# Patient Record
Sex: Male | Born: 1945
Health system: Southern US, Community
[De-identification: ages and names within clinical notes are randomized; demographics above are authoritative.]

## PROBLEM LIST (undated history)

## (undated) DIAGNOSIS — E7800 Pure hypercholesterolemia, unspecified: Secondary | ICD-10-CM

## (undated) DIAGNOSIS — F419 Anxiety disorder, unspecified: Secondary | ICD-10-CM

## (undated) DIAGNOSIS — I6521 Occlusion and stenosis of right carotid artery: Secondary | ICD-10-CM

## (undated) DIAGNOSIS — G5600 Carpal tunnel syndrome, unspecified upper limb: Secondary | ICD-10-CM

## (undated) DIAGNOSIS — I2510 Atherosclerotic heart disease of native coronary artery without angina pectoris: Secondary | ICD-10-CM

## (undated) DIAGNOSIS — K219 Gastro-esophageal reflux disease without esophagitis: Secondary | ICD-10-CM

## (undated) DIAGNOSIS — E669 Obesity, unspecified: Secondary | ICD-10-CM

## (undated) DIAGNOSIS — Z87442 Personal history of urinary calculi: Secondary | ICD-10-CM

## (undated) DIAGNOSIS — I639 Cerebral infarction, unspecified: Secondary | ICD-10-CM

## (undated) DIAGNOSIS — Z860100 Personal history of colon polyps, unspecified: Secondary | ICD-10-CM

## (undated) DIAGNOSIS — I10 Essential (primary) hypertension: Secondary | ICD-10-CM

## (undated) DIAGNOSIS — M1990 Unspecified osteoarthritis, unspecified site: Secondary | ICD-10-CM

## (undated) DIAGNOSIS — I219 Acute myocardial infarction, unspecified: Secondary | ICD-10-CM

## (undated) HISTORY — DX: Acute myocardial infarction, unspecified: I21.9

## (undated) HISTORY — DX: Cerebral infarction, unspecified: I63.9

## (undated) HISTORY — DX: Atherosclerotic heart disease of native coronary artery without angina pectoris: I25.10

## (undated) HISTORY — PX: CYST EXCISION: SHX5701

## (undated) HISTORY — DX: Anxiety disorder, unspecified: F41.9

## (undated) HISTORY — DX: Essential (primary) hypertension: I10

## (undated) HISTORY — DX: Personal history of colonic polyps: Z86.010

## (undated) HISTORY — DX: Obesity, unspecified: E66.9

## (undated) HISTORY — PX: CARDIAC CATHETERIZATION: SHX172

## (undated) HISTORY — DX: Pure hypercholesterolemia, unspecified: E78.00

## (undated) HISTORY — DX: Carpal tunnel syndrome, unspecified upper limb: G56.00

## (undated) HISTORY — DX: Personal history of colon polyps, unspecified: Z86.0100

---

## 1998-05-12 ENCOUNTER — Inpatient Hospital Stay (HOSPITAL_COMMUNITY): Admission: EM | Admit: 1998-05-12 | Discharge: 1998-05-14 | Payer: Self-pay | Admitting: Emergency Medicine

## 1998-05-12 ENCOUNTER — Encounter: Payer: Self-pay | Admitting: Emergency Medicine

## 1998-05-23 HISTORY — PX: CORONARY STENT PLACEMENT: SHX1402

## 1998-10-01 ENCOUNTER — Encounter: Admission: RE | Admit: 1998-10-01 | Discharge: 1998-10-28 | Payer: Self-pay

## 1998-11-30 ENCOUNTER — Encounter: Admission: RE | Admit: 1998-11-30 | Discharge: 1999-02-28 | Payer: Self-pay

## 1999-04-05 ENCOUNTER — Encounter: Admission: RE | Admit: 1999-04-05 | Discharge: 1999-04-05 | Payer: Self-pay

## 2000-02-15 ENCOUNTER — Encounter (INDEPENDENT_AMBULATORY_CARE_PROVIDER_SITE_OTHER): Payer: Self-pay | Admitting: Specialist

## 2000-02-15 ENCOUNTER — Other Ambulatory Visit: Admission: RE | Admit: 2000-02-15 | Discharge: 2000-02-15 | Payer: Self-pay | Admitting: Internal Medicine

## 2001-02-22 ENCOUNTER — Other Ambulatory Visit: Admission: RE | Admit: 2001-02-22 | Discharge: 2001-02-22 | Payer: Self-pay | Admitting: Internal Medicine

## 2003-05-30 ENCOUNTER — Emergency Department (HOSPITAL_COMMUNITY): Admission: EM | Admit: 2003-05-30 | Discharge: 2003-05-30 | Payer: Self-pay | Admitting: Emergency Medicine

## 2004-07-16 ENCOUNTER — Ambulatory Visit (HOSPITAL_COMMUNITY): Admission: RE | Admit: 2004-07-16 | Discharge: 2004-07-16 | Payer: Self-pay | Admitting: Internal Medicine

## 2004-12-17 ENCOUNTER — Ambulatory Visit: Payer: Self-pay | Admitting: Internal Medicine

## 2004-12-24 ENCOUNTER — Ambulatory Visit: Payer: Self-pay | Admitting: Cardiology

## 2005-01-11 ENCOUNTER — Ambulatory Visit: Payer: Self-pay | Admitting: Cardiology

## 2005-02-02 ENCOUNTER — Ambulatory Visit: Payer: Self-pay | Admitting: Cardiology

## 2005-04-05 ENCOUNTER — Encounter (INDEPENDENT_AMBULATORY_CARE_PROVIDER_SITE_OTHER): Payer: Self-pay | Admitting: Family Medicine

## 2005-04-06 ENCOUNTER — Encounter: Payer: Self-pay | Admitting: Orthopedic Surgery

## 2005-04-11 ENCOUNTER — Ambulatory Visit: Payer: Self-pay | Admitting: Orthopedic Surgery

## 2005-08-01 ENCOUNTER — Ambulatory Visit: Payer: Self-pay | Admitting: Family Medicine

## 2005-09-22 ENCOUNTER — Ambulatory Visit: Payer: Self-pay | Admitting: Orthopedic Surgery

## 2005-10-31 ENCOUNTER — Ambulatory Visit: Payer: Self-pay | Admitting: Family Medicine

## 2006-01-19 ENCOUNTER — Ambulatory Visit: Payer: Self-pay | Admitting: Cardiology

## 2006-01-26 ENCOUNTER — Ambulatory Visit: Payer: Self-pay | Admitting: Cardiology

## 2006-02-14 ENCOUNTER — Ambulatory Visit: Payer: Self-pay | Admitting: Family Medicine

## 2006-03-23 ENCOUNTER — Encounter (INDEPENDENT_AMBULATORY_CARE_PROVIDER_SITE_OTHER): Payer: Self-pay | Admitting: Family Medicine

## 2006-04-03 ENCOUNTER — Ambulatory Visit: Payer: Self-pay | Admitting: Family Medicine

## 2006-04-10 ENCOUNTER — Ambulatory Visit: Payer: Self-pay | Admitting: Family Medicine

## 2006-05-03 ENCOUNTER — Ambulatory Visit: Payer: Self-pay

## 2006-06-05 ENCOUNTER — Ambulatory Visit: Payer: Self-pay

## 2006-06-19 ENCOUNTER — Encounter: Payer: Self-pay | Admitting: Family Medicine

## 2006-06-19 DIAGNOSIS — E669 Obesity, unspecified: Secondary | ICD-10-CM | POA: Insufficient documentation

## 2006-06-19 DIAGNOSIS — Z860100 Personal history of colon polyps, unspecified: Secondary | ICD-10-CM | POA: Insufficient documentation

## 2006-06-19 DIAGNOSIS — E7800 Pure hypercholesterolemia, unspecified: Secondary | ICD-10-CM

## 2006-06-19 DIAGNOSIS — I252 Old myocardial infarction: Secondary | ICD-10-CM | POA: Insufficient documentation

## 2006-06-19 DIAGNOSIS — I10 Essential (primary) hypertension: Secondary | ICD-10-CM | POA: Insufficient documentation

## 2006-07-06 ENCOUNTER — Ambulatory Visit: Payer: Self-pay | Admitting: Family Medicine

## 2006-07-07 ENCOUNTER — Telehealth (INDEPENDENT_AMBULATORY_CARE_PROVIDER_SITE_OTHER): Payer: Self-pay | Admitting: Family Medicine

## 2006-07-10 ENCOUNTER — Ambulatory Visit (HOSPITAL_COMMUNITY): Admission: RE | Admit: 2006-07-10 | Discharge: 2006-07-10 | Payer: Self-pay | Admitting: Family Medicine

## 2006-07-10 ENCOUNTER — Encounter (INDEPENDENT_AMBULATORY_CARE_PROVIDER_SITE_OTHER): Payer: Self-pay | Admitting: Family Medicine

## 2006-07-10 ENCOUNTER — Telehealth (INDEPENDENT_AMBULATORY_CARE_PROVIDER_SITE_OTHER): Payer: Self-pay | Admitting: Family Medicine

## 2006-07-28 ENCOUNTER — Encounter (INDEPENDENT_AMBULATORY_CARE_PROVIDER_SITE_OTHER): Payer: Self-pay | Admitting: Family Medicine

## 2006-12-18 ENCOUNTER — Telehealth (INDEPENDENT_AMBULATORY_CARE_PROVIDER_SITE_OTHER): Payer: Self-pay | Admitting: Family Medicine

## 2006-12-18 ENCOUNTER — Ambulatory Visit: Payer: Self-pay | Admitting: Family Medicine

## 2006-12-18 LAB — CONVERTED CEMR LAB
Cholesterol, target level: 200 mg/dL
LDL Goal: 100 mg/dL
Rapid Strep: NEGATIVE

## 2007-01-01 ENCOUNTER — Ambulatory Visit: Payer: Self-pay | Admitting: Family Medicine

## 2007-01-01 DIAGNOSIS — I2510 Atherosclerotic heart disease of native coronary artery without angina pectoris: Secondary | ICD-10-CM | POA: Insufficient documentation

## 2007-02-09 ENCOUNTER — Ambulatory Visit: Payer: Self-pay | Admitting: Cardiology

## 2007-03-08 ENCOUNTER — Encounter (INDEPENDENT_AMBULATORY_CARE_PROVIDER_SITE_OTHER): Payer: Self-pay | Admitting: Family Medicine

## 2007-03-11 ENCOUNTER — Encounter (INDEPENDENT_AMBULATORY_CARE_PROVIDER_SITE_OTHER): Payer: Self-pay | Admitting: Family Medicine

## 2007-03-12 ENCOUNTER — Ambulatory Visit: Payer: Self-pay | Admitting: Family Medicine

## 2007-03-12 DIAGNOSIS — B009 Herpesviral infection, unspecified: Secondary | ICD-10-CM | POA: Insufficient documentation

## 2007-12-25 ENCOUNTER — Ambulatory Visit: Payer: Self-pay | Admitting: Family Medicine

## 2007-12-25 DIAGNOSIS — G5603 Carpal tunnel syndrome, bilateral upper limbs: Secondary | ICD-10-CM | POA: Insufficient documentation

## 2007-12-25 DIAGNOSIS — G5600 Carpal tunnel syndrome, unspecified upper limb: Secondary | ICD-10-CM | POA: Insufficient documentation

## 2007-12-25 LAB — CONVERTED CEMR LAB
Blood in Urine, dipstick: NEGATIVE
Protein, U semiquant: NEGATIVE
Urobilinogen, UA: 0.2
WBC Urine, dipstick: NEGATIVE
pH: 5.5

## 2007-12-26 ENCOUNTER — Encounter (INDEPENDENT_AMBULATORY_CARE_PROVIDER_SITE_OTHER): Payer: Self-pay | Admitting: Family Medicine

## 2008-01-02 LAB — CONVERTED CEMR LAB
ALT: 27 units/L (ref 0–53)
Alkaline Phosphatase: 53 units/L (ref 39–117)
BUN: 17 mg/dL (ref 6–23)
Basophils Absolute: 0 10*3/uL (ref 0.0–0.1)
Basophils Relative: 1 % (ref 0–1)
CO2: 21 meq/L (ref 19–32)
Chloride: 107 meq/L (ref 96–112)
Cholesterol: 127 mg/dL (ref 0–200)
Creatinine, Ser: 1.17 mg/dL (ref 0.40–1.50)
Eosinophils Absolute: 0.2 10*3/uL (ref 0.0–0.7)
Glucose, Bld: 87 mg/dL (ref 70–99)
HCT: 46.8 % (ref 39.0–52.0)
Hemoglobin: 15.6 g/dL (ref 13.0–17.0)
Lymphocytes Relative: 23 % (ref 12–46)
Lymphs Abs: 1.3 10*3/uL (ref 0.7–4.0)
Monocytes Relative: 9 % (ref 3–12)
Neutro Abs: 3.7 10*3/uL (ref 1.7–7.7)
Platelets: 201 10*3/uL (ref 150–400)
RBC: 5.19 M/uL (ref 4.22–5.81)
Sodium: 139 meq/L (ref 135–145)
Total Bilirubin: 0.6 mg/dL (ref 0.3–1.2)
VLDL: 36 mg/dL (ref 0–40)

## 2008-01-15 ENCOUNTER — Ambulatory Visit: Payer: Self-pay | Admitting: Family Medicine

## 2008-04-09 ENCOUNTER — Ambulatory Visit: Payer: Self-pay | Admitting: Internal Medicine

## 2008-04-21 ENCOUNTER — Ambulatory Visit: Payer: Self-pay | Admitting: Family Medicine

## 2008-04-21 DIAGNOSIS — J209 Acute bronchitis, unspecified: Secondary | ICD-10-CM | POA: Insufficient documentation

## 2008-05-07 ENCOUNTER — Ambulatory Visit: Payer: Self-pay | Admitting: Family Medicine

## 2008-05-07 DIAGNOSIS — H60399 Other infective otitis externa, unspecified ear: Secondary | ICD-10-CM | POA: Insufficient documentation

## 2008-05-13 ENCOUNTER — Ambulatory Visit: Payer: Self-pay | Admitting: Family Medicine

## 2009-01-06 ENCOUNTER — Encounter (INDEPENDENT_AMBULATORY_CARE_PROVIDER_SITE_OTHER): Payer: Self-pay | Admitting: Family Medicine

## 2009-01-12 ENCOUNTER — Encounter (INDEPENDENT_AMBULATORY_CARE_PROVIDER_SITE_OTHER): Payer: Self-pay | Admitting: Family Medicine

## 2009-01-28 ENCOUNTER — Encounter (INDEPENDENT_AMBULATORY_CARE_PROVIDER_SITE_OTHER): Payer: Self-pay | Admitting: *Deleted

## 2009-03-16 ENCOUNTER — Ambulatory Visit: Payer: Self-pay | Admitting: Internal Medicine

## 2009-03-30 ENCOUNTER — Ambulatory Visit: Payer: Self-pay | Admitting: Internal Medicine

## 2009-03-30 ENCOUNTER — Encounter: Payer: Self-pay | Admitting: Internal Medicine

## 2009-03-30 DIAGNOSIS — R1084 Generalized abdominal pain: Secondary | ICD-10-CM | POA: Insufficient documentation

## 2009-04-01 ENCOUNTER — Ambulatory Visit: Payer: Self-pay | Admitting: Gastroenterology

## 2009-04-01 ENCOUNTER — Telehealth (INDEPENDENT_AMBULATORY_CARE_PROVIDER_SITE_OTHER): Payer: Self-pay | Admitting: *Deleted

## 2009-04-01 ENCOUNTER — Ambulatory Visit (HOSPITAL_COMMUNITY): Admission: RE | Admit: 2009-04-01 | Discharge: 2009-04-01 | Payer: Self-pay | Admitting: Internal Medicine

## 2009-04-01 DIAGNOSIS — E782 Mixed hyperlipidemia: Secondary | ICD-10-CM | POA: Insufficient documentation

## 2009-04-01 DIAGNOSIS — R1032 Left lower quadrant pain: Secondary | ICD-10-CM | POA: Insufficient documentation

## 2009-04-01 DIAGNOSIS — K625 Hemorrhage of anus and rectum: Secondary | ICD-10-CM | POA: Insufficient documentation

## 2009-04-01 LAB — CONVERTED CEMR LAB
Eosinophils Relative: 5.3 % — ABNORMAL HIGH (ref 0.0–5.0)
HCT: 43 % (ref 39.0–52.0)
Lymphocytes Relative: 26.2 % (ref 12.0–46.0)
Monocytes Absolute: 0.5 10*3/uL (ref 0.1–1.0)
Monocytes Relative: 8.3 % (ref 3.0–12.0)
Neutro Abs: 3.2 10*3/uL (ref 1.4–7.7)
Platelets: 171 10*3/uL (ref 150.0–400.0)
WBC: 5.6 10*3/uL (ref 4.5–10.5)

## 2009-04-02 ENCOUNTER — Encounter: Payer: Self-pay | Admitting: Internal Medicine

## 2009-04-03 ENCOUNTER — Ambulatory Visit: Payer: Self-pay | Admitting: Gastroenterology

## 2009-04-03 DIAGNOSIS — K602 Anal fissure, unspecified: Secondary | ICD-10-CM | POA: Insufficient documentation

## 2010-04-20 ENCOUNTER — Ambulatory Visit: Payer: Self-pay | Admitting: Orthopedic Surgery

## 2010-04-20 DIAGNOSIS — M6530 Trigger finger, unspecified finger: Secondary | ICD-10-CM | POA: Insufficient documentation

## 2010-06-22 NOTE — Progress Notes (Signed)
Summary: Progress note  Progress note   Imported By: Jacklynn Ganong 04/19/2010 10:14:44  _____________________________________________________________________  External Attachment:    Type:   Image     Comment:   External Document

## 2010-06-22 NOTE — Letter (Signed)
Summary: History form  History form   Imported By: Jacklynn Ganong 04/20/2010 15:55:38  _____________________________________________________________________  External Attachment:    Type:   Image     Comment:   External Document

## 2010-06-22 NOTE — Progress Notes (Signed)
Summary: Initial evaluation  Initial evaluation   Imported By: Jacklynn Ganong 04/19/2010 10:13:52  _____________________________________________________________________  External Attachment:    Type:   Image     Comment:   External Document

## 2010-06-22 NOTE — Assessment & Plan Note (Signed)
Summary: TRIGGER FINGER LT MIDDLE FINGER/MEDICARE/BCBS   Vital Signs:  Patient profile:   65 year old male Height:      66 inches Weight:      218 pounds Pulse rate:   68 / minute Resp:     16 per minute  Vitals Entered By: [name] (April 20, 2010 2:25 PM)  Visit Type:  new patient Referring Provider:  self Primary Provider:  [name], MD  CC:  trigger finger.  History of Present Illness: I saw Jeremy [name] in the office today for an initial visit.  He is a 65 years old man with the complaint of:  trigger finger, left middle.  No injury.  No xrays.  Meds: Lipitor, Metoprolol, Enalapril.  Patient has history of RIGHT and LEFT trigger thumbs, now presents with LEFT long finger triggering, locking, stabbing, burning pain. No history of trauma. No numbness present for 3 months    Allergies (verified): No Known Drug Allergies  Past History:  Past Surgical History: CAD,STENT X 3 2000' 2 cyst removed?  Family History: Father: Dead Old Age - 83s.  Mother: Dead 78s CAD and HTN Siblings: Sisters: 1 Living 81 - Healthy, 2 Dead Not sure about one and the other had epilepsy Brothers: 2 Living: 6 and 50 - CAD and Skin Cancers Not sure what primary was. FH of Cancer:  Family History of Diabetes Family History Coronary Heart Disease male < 32 Family History of Arthritis Hx, family, asthma  Social History: Divorced Hx of smoking: Quit 20 years ago - smoked 2 packs per day for 20 or so years. no alcohol 4 cups of coffee daily No ETOH Retired: Psychologist, occupational 10th grade  Review of Systems Respiratory:  Complains of couch and tightness; denies short of breath, wheezing, pain on inspiration, and snoring . Neurologic:  Complains of numbness, tingling, and tremors; denies unsteady gait, dizziness, and seizure. Musculoskeletal:  Complains of joint pain, swelling, stiffness, and muscle pain; denies instability, redness, and heat. HEENT:  Complains of eye pain and  watering; denies blurred or double vision and redness. Hemoatologic:  Complains of easy bleeding; denies brusing.  The review of systems is negative for Constitutional, Cardiovascular, Gastrointestinal, Genitourinary, Endocrine, Psychiatric, Skin, and Immunology.  Physical Exam  Additional Exam:  general appearance was normal.  Normal vascular function to the long finger and the hand.  The skin was intact.  The sensation was normal.  The patient was awake, alert, and oriented x3.  Range of motion was normal. However, there was tenderness over the A1 pulley. Alignment was normal. Strength in grip was normal.  Lymph nodes were not tested, and reflexes were not tested   Impression & Recommendations:  Problem # 1:  TRIGGER FINGER, LEFT MIDDLE (ICD-727.03) Assessment New  Verbal consent was obtained: The LLfinger was prepped with ethyl chloride and injected with 1:1 injection of .25% sensorcaine, 1cc  and 40 mg of depomedrol, 1cc. There were no complications.  Orders: New Patient Level II ([address]) Injection, Tendon / Ligament (60454) Depo- Medrol 40mg  (J1030)  Patient Instructions: 1)  You have received an injection of cortisone today. You may experience increased pain at the injection site. Apply ice pack to the area for 20 minutes every 2 hours and take 2 xtra strength tylenol every 8 hours. This increased pain will usually resolve in 24 hours. The injection will take effect in 3-10 days.  2)  Please schedule a follow-up appointment as needed.   Orders Added: 1)  New Patient  Level II [99202] 2)  Injection, Tendon / Ligament [20550] 3)  Depo- Medrol 40mg  [J1030]

## 2010-07-26 ENCOUNTER — Telehealth: Payer: Self-pay | Admitting: Cardiology

## 2010-08-03 NOTE — Progress Notes (Signed)
Summary: numbness in mouth  Phone Note Call from Patient   Caller: Patient [phone] or  [phone] Reason for Call: Talk to Nurse, Privacy/Consent Authorization Summary of Call: pt having numbness in tongue, lips and inside of cheeks x 6 weeks Initial call taken by: Glynda Jaeger,  July 26, 2010 9:32 AM  Follow-up for Phone Call        07/26/10--1330pm--pt calling c/o numbness in tongue,sides of mouth, and lips--pt has not seen [place] since 2008--pt states he is seeing PCP today, so i advised him to let pcp know of his symptoms--pt would also like to see dr Riley Kill again so pt was transferred to appoint. to make one Follow-up by: Ledon Snare, RN,  July 26, 2010 1:33 PM

## 2010-08-30 ENCOUNTER — Encounter: Payer: Self-pay | Admitting: Cardiology

## 2010-09-01 ENCOUNTER — Ambulatory Visit (INDEPENDENT_AMBULATORY_CARE_PROVIDER_SITE_OTHER): Payer: Medicare Other | Admitting: Cardiology

## 2010-09-01 ENCOUNTER — Encounter: Payer: Self-pay | Admitting: Cardiology

## 2010-09-01 DIAGNOSIS — I2510 Atherosclerotic heart disease of native coronary artery without angina pectoris: Secondary | ICD-10-CM

## 2010-09-01 DIAGNOSIS — N529 Male erectile dysfunction, unspecified: Secondary | ICD-10-CM

## 2010-09-01 DIAGNOSIS — I10 Essential (primary) hypertension: Secondary | ICD-10-CM

## 2010-09-01 DIAGNOSIS — E7800 Pure hypercholesterolemia, unspecified: Secondary | ICD-10-CM

## 2010-09-01 MED ORDER — ATORVASTATIN CALCIUM 40 MG PO TABS: 40.0000 mg | ORAL_TABLET | Freq: Every day | ORAL | Status: DC

## 2010-09-01 MED ORDER — NITROGLYCERIN 0.4 MG SL SUBL: 0.4000 mg | SUBLINGUAL_TABLET | SUBLINGUAL | Status: DC | PRN

## 2010-09-01 MED ORDER — ENALAPRIL MALEATE 2.5 MG PO TABS: 2.5000 mg | ORAL_TABLET | Freq: Every day | ORAL | Status: DC

## 2010-09-01 MED ORDER — METOPROLOL SUCCINATE ER 50 MG PO TB24: 50.0000 mg | ORAL_TABLET | Freq: Every day | ORAL | Status: DC

## 2010-09-01 NOTE — Patient Instructions (Signed)
Your physician recommends that you continue on your current medications as directed. Please refer to the Current Medication list given to you today.     

## 2010-09-05 DIAGNOSIS — N529 Male erectile dysfunction, unspecified: Secondary | ICD-10-CM | POA: Insufficient documentation

## 2010-09-05 NOTE — Assessment & Plan Note (Signed)
His BP are elevated, but he must check them at home, and provide follow up to Dr. Margo Aye with regard to reevaluating his medications.  Once this is recorded, appropriate changes can be made.  Question as to weather or not sleep apnea might be present.  Would defer to Dr. Margo Aye.

## 2010-09-05 NOTE — Assessment & Plan Note (Signed)
No current symptoms.  He is doing well. No further treatment at present based on guidelines.

## 2010-09-05 NOTE — Progress Notes (Signed)
HPI:  Jeremy [name] is getting along well.  He is not having chest pain or shortness of breath.  We also reviewed his medications, and he is tolerating these well.  He denies syncope, rash, fatigue.   Current Outpatient Prescriptions  Medication Sig Dispense Refill  . aspirin 325 MG tablet Take 325 mg by mouth daily.        Marland Kitchen atorvastatin (LIPITOR) 40 MG tablet Take 1 tablet (40 mg total) by mouth daily.  90 tablet  3  . enalapril (VASOTEC) 2.5 MG tablet Take 1 tablet (2.5 mg total) by mouth daily.  90 tablet  3  . metoprolol (TOPROL-XL) 50 MG 24 hr tablet Take 1 tablet (50 mg total) by mouth daily.  90 tablet  3  . nitroGLYCERIN (NITROSTAT) 0.4 MG SL tablet Place 1 tablet (0.4 mg total) under the tongue every 5 (five) minutes as needed for chest pain.  25 tablet  6    No Known Allergies  Past Medical History  Diagnosis Date  . Carpal tunnel syndrome     bilateral  . Coronary artery disease   . Obesity   . Hypercholesterolemia   . Myocardial infarction     hx of  . Hypertension   . Hx of colonic polyp     Past Surgical History  Procedure Date  . Coronary stent placement 2000    x3     Family History  Problem Relation Age of Onset  . Other Father 83    deceased old age  . Coronary artery disease Mother 70    deceased  . Hypertension Mother   . Other Sister 64    living and healthy  . Coronary artery disease Brother   . Skin cancer Brother   . Cancer      family hx of  . Diabetes      family hx of  . Coronary artery disease      family hx of male < 75  . Arthritis      family hx of  . Asthma      family hx of    History   Social History  . Marital Status: Divorced    Spouse Name: N/A    Number of Children: N/A  . Years of Education: N/A   Occupational History  . retired     Psychologist, occupational   Social History Main Topics  . Smoking status: Former Smoker    Quit date: 05/23/1990  . Smokeless tobacco: Not on file   Comment: smoked 2 packs per day for 20 years  .  Alcohol Use: No  . Drug Use: No  . Sexually Active: Not on file   Other Topics Concern  . Not on file   Social History Narrative  . No narrative on file    ROS: Please see the HPI.  All other systems reviewed and negative.  PHYSICAL EXAM:  BP 169/83  Pulse 65  Ht 5\' 8"  (1.727 m)  Wt 210 lb (95.255 kg)  BMI 31.93 kg/m2  General: Well developed, well nourished, in no acute distress. Head:  Normocephalic and atraumatic. Neck: no JVD Lungs: Clear to auscultation and percussion. Heart: Normal S1 and S2.  There is a prominent S4.   Abdomen:  Normal bowel sounds; soft; non tender; no organomegaly Pulses: Pulses normal in all 4 extremities. Extremities: No clubbing or cyanosis. No edema. Neurologic: Alert and oriented x 3.  EKG:  Normal sinus rhythm.  WNL.  ASSESSMENT AND PLAN:

## 2010-09-05 NOTE — Assessment & Plan Note (Signed)
Patient notes some difficulty maintaining an erection.  Role of CAD and medications discussed.  He would like to try Viagra, or something similar.  Interaction, and inability to use NTG discussed with patient.  I told him to bring it up with Dr. Margo Aye at his appointment.  It would be ok to try from a cardiac standpoint.

## 2010-09-05 NOTE — Assessment & Plan Note (Signed)
Last lipids are more than three years ago here, but getting labs done with Dr. Margo Aye in [place] who does close follow up.

## 2010-09-08 ENCOUNTER — Telehealth: Payer: Self-pay | Admitting: Cardiology

## 2010-09-08 NOTE — Telephone Encounter (Signed)
Left message for pt to call back.  Unsure of pt's question in regards to ASA.

## 2010-09-09 MED ORDER — ASPIRIN EC 81 MG PO TBEC: 81.0000 mg | DELAYED_RELEASE_TABLET | Freq: Every day | ORAL | Status: AC

## 2010-09-09 NOTE — Telephone Encounter (Signed)
Pt calling back re yesterday's message, pt on 81mg  of asa and was told by dr Riley Kill to stop and to start baby asa, but the baby is 81mg  too, pt confused

## 2010-09-09 NOTE — Telephone Encounter (Signed)
Called patient back and advised that baby Jeremy [name] is the same as Asa 81 mg which he has been taking for several years. Will fix medication list from yesterday. (He was signed in on Asa 325mg  in error).

## 2010-10-05 NOTE — Letter (Signed)
February 09, 2007    Franchot Heidelberg, MD  [address]  [place], [place]  [address]   RE:  KENSHIN, SPLAWN  MRN:  [id number]  /  DOB:  [birth year]   Dear Dr. Erby Pian:   I had the pleasure of seeing Garrick Midgley in the office today in  followup.  Mr. Bailon and I go way back, dating back to 80 when I did  the first angioplasty on him. From a clinical standpoint he has gotten  along well.  He could use some weight loss.  He also could use some  better blood pressure control, and I have reminded him of both.  He did  have a last cholesterol done a year ago in this office and his LDL was  52 on a regimen that includes Lipitor 40 mg daily.   He has been staying active and not been having any chest pain.   MEDICATIONS:  1. Lipitor 40 mg daily.  2. Enteric coated aspirin 81 mg daily.  3. Toprol-XL 50 mg daily.  4. Enalapril 2.5 daily.   PHYSICAL EXAMINATION:  Today, the blood pressure is 150/80, pulse is 60.  LUNG FIELDS:  Clear.  CARDIAC RHYTHM:  Regular.  EXTREMITIES:  No edema.   EKG reveals sinus rhythm with nonspecific T abnormality.   Overall, he is doing well from a cardiac standpoint.  I have no new  recommendations at this time which are specific; however, his blood  pressure is elevated as is his weight.  I have encouraged him with  moderate weight loss as well as better blood pressure control and asked  him to get a blood pressure cuff to measure his pressures.  I have asked  him to follow up with you, to bring  these in tow.  I have also asked him to stop by your office and try to  request a lipid profile so that this can be checked.  I would be  certainly delighted for him to have followup in your office for this  purpose.  I appreciate the opportunity of sharing in this nice  gentleman's care and please do not hesitate ever to call me.    Sincerely,      Arturo Morton. Riley Kill, MD, [facility]  Electronically Signed    TDS/MedQ  DD: 02/09/2007  DT:  02/09/2007  Job #: [id number]

## 2010-10-05 NOTE — Assessment & Plan Note (Signed)
[facility]                                 ON-CALL NOTE   NAME:HALLTharon, [name]                         MRN:          [id number]  DATE:03/31/2009                            DOB:          12-03-1945    [name] girlfriend, Jeremy [name], called to state that Jeremy [name] is having  abdominal pain.  He underwent a colonoscopy yesterday.  Since returning  home yesterday, he has had abdominal pain.  He has passed a small amount  of blood per rectum.  Pain is continuous today and into tonight.  I  advised her to have the patient seen in the emergency room because of  ongoing abdominal pain.  She indicated that she would convey that advice  to the patient.     Barbette Hair. Arlyce Dice, MD,FACG  Electronically Signed    RDK/MedQ  DD: 03/31/2009  DT: 04/01/2009  Job #: [id number]   cc:   Wilhemina Bonito. Marina Goodell, MD

## 2010-10-08 NOTE — Assessment & Plan Note (Signed)
[facility]                              CARDIOLOGY OFFICE NOTE   NAME:Jeremy [name], Jeremy [name]                       MRN:          [id number]  DATE:01/26/2006                            DOB:          January 14, 1946    Jeremy [name] is in for follow up.  He is generally doing well.  He has trouble  walking because of his knees and his prior injury.  However, he is staying  really quite active.  He is not having any ongoing chest pain.  He has not  had a treadmill in sometime but has had no ischemic symptoms whatsoever.  He  has had a follow up lipid profile.  His lipid profile reveals an LDL of 52,  his HDL is low at 32, his liver function studies are essentially normal.   MEDICATIONS:  1. Lipitor 40 mg daily.  2. Enteric coated aspirin 325 mg daily.  3. Toprol-XL 50 mg daily.  4. Enalapril 2.5 daily.   On physical today, the blood pressure is 154/88, which is higher than  normal, pulse is 64, lung fields are clear and the cardiac rhythm is  regular.  The extremities reveal no edema.  Weight is 225.  I cannot feel an  abdominal mass.   IMPRESSION:  1. Mild systemic hypertension.  2. Coronary artery disease status post multivessel percutaneous coronary      intervention.   PLAN:  1. Decrease aspirin to 81 mg daily.  2. Continue current medical regimen.  3. Abdominal ultrasound to exclude abdominal aortic aneurysm at 60.   ADDENDUM:  An EKG reveals normal sinus rhythm, within normal limits.                              Arturo Morton. Riley Kill, MD, [facility]    TDS/MedQ  DD:  01/26/2006  DT:  01/26/2006  Job #:  [id number]   cc:   Bernerd Limbo. Leona Carry, M.D.

## 2011-07-29 ENCOUNTER — Other Ambulatory Visit (HOSPITAL_COMMUNITY): Payer: Self-pay | Admitting: Internal Medicine

## 2011-07-29 ENCOUNTER — Ambulatory Visit (HOSPITAL_COMMUNITY)
Admission: RE | Admit: 2011-07-29 | Discharge: 2011-07-29 | Disposition: A | Discharge status: Home / Self Care | Payer: Medicare Other | Source: Ambulatory Visit | Attending: Internal Medicine | Admitting: Internal Medicine

## 2011-07-29 DIAGNOSIS — M545 Low back pain, unspecified: Secondary | ICD-10-CM

## 2011-07-29 DIAGNOSIS — M5137 Other intervertebral disc degeneration, lumbosacral region: Secondary | ICD-10-CM | POA: Insufficient documentation

## 2011-07-29 DIAGNOSIS — M47817 Spondylosis without myelopathy or radiculopathy, lumbosacral region: Secondary | ICD-10-CM | POA: Insufficient documentation

## 2011-07-29 DIAGNOSIS — I700 Atherosclerosis of aorta: Secondary | ICD-10-CM | POA: Insufficient documentation

## 2011-07-29 DIAGNOSIS — M51379 Other intervertebral disc degeneration, lumbosacral region without mention of lumbar back pain or lower extremity pain: Secondary | ICD-10-CM | POA: Insufficient documentation

## 2011-08-03 ENCOUNTER — Other Ambulatory Visit (HOSPITAL_COMMUNITY): Payer: Self-pay | Admitting: Internal Medicine

## 2011-08-03 DIAGNOSIS — N200 Calculus of kidney: Secondary | ICD-10-CM

## 2011-08-04 ENCOUNTER — Ambulatory Visit (HOSPITAL_COMMUNITY)
Admission: RE | Admit: 2011-08-04 | Discharge: 2011-08-04 | Disposition: A | Discharge status: Home / Self Care | Payer: Medicare Other | Source: Ambulatory Visit | Attending: Internal Medicine | Admitting: Internal Medicine

## 2011-08-04 DIAGNOSIS — R9389 Abnormal findings on diagnostic imaging of other specified body structures: Secondary | ICD-10-CM | POA: Insufficient documentation

## 2011-08-04 DIAGNOSIS — R1031 Right lower quadrant pain: Secondary | ICD-10-CM | POA: Insufficient documentation

## 2011-08-04 DIAGNOSIS — N200 Calculus of kidney: Secondary | ICD-10-CM

## 2011-08-08 ENCOUNTER — Emergency Department (HOSPITAL_COMMUNITY)
Admission: EM | Admit: 2011-08-08 | Discharge: 2011-08-09 | Disposition: A | Discharge status: Home / Self Care | Payer: Medicare Other | Attending: Emergency Medicine | Admitting: Emergency Medicine

## 2011-08-08 ENCOUNTER — Encounter (HOSPITAL_COMMUNITY): Payer: Self-pay | Admitting: *Deleted

## 2011-08-08 ENCOUNTER — Other Ambulatory Visit (HOSPITAL_COMMUNITY): Payer: Self-pay | Admitting: Internal Medicine

## 2011-08-08 DIAGNOSIS — E669 Obesity, unspecified: Secondary | ICD-10-CM | POA: Insufficient documentation

## 2011-08-08 DIAGNOSIS — R0989 Other specified symptoms and signs involving the circulatory and respiratory systems: Secondary | ICD-10-CM | POA: Insufficient documentation

## 2011-08-08 DIAGNOSIS — R0609 Other forms of dyspnea: Secondary | ICD-10-CM | POA: Insufficient documentation

## 2011-08-08 DIAGNOSIS — M5430 Sciatica, unspecified side: Secondary | ICD-10-CM

## 2011-08-08 DIAGNOSIS — M545 Low back pain, unspecified: Secondary | ICD-10-CM

## 2011-08-08 DIAGNOSIS — I2510 Atherosclerotic heart disease of native coronary artery without angina pectoris: Secondary | ICD-10-CM | POA: Insufficient documentation

## 2011-08-08 DIAGNOSIS — E7800 Pure hypercholesterolemia, unspecified: Secondary | ICD-10-CM | POA: Insufficient documentation

## 2011-08-08 DIAGNOSIS — T7840XA Allergy, unspecified, initial encounter: Secondary | ICD-10-CM

## 2011-08-08 DIAGNOSIS — I252 Old myocardial infarction: Secondary | ICD-10-CM | POA: Insufficient documentation

## 2011-08-08 NOTE — ED Provider Notes (Signed)
History   This chart was scribed for [employer]. Colon Branch, MD by Sofie Rower. The patient was seen in room APA11/APA11 and the patient's care was started at 11:57PM.    CSN: [id number]  Arrival date & time 08/08/11  2236   First MD Initiated Contact with Patient 08/08/11 2355      Chief Complaint  Patient presents with  . Allergic Reaction    (Consider location/radiation/quality/duration/timing/severity/associated sxs/prior treatment) HPI  Jeremy [name] is a 66 y.o. male who presents to the Emergency Department complaining of moderate, episodic allergic reaction with associated symptoms of difficultly breathing, itching, back pain. Pt states he was administered a new medication (percocet and robaxin) for his back pain, the first time he took the new medicine was Saturday afternoon. Modifying factors include lying down which intensifies the difficulty breathing and pain. Pt also complains of moderate, episodic radiating back pain onset today. Pt states "when he bends over, then pain intensifies". Pt has a hx of asthma, MI, kidney stones, back injury (late 1990's).   PCP is Dr. Margo Aye.   Past Medical History  Diagnosis Date  . Carpal tunnel syndrome     bilateral  . Coronary artery disease   . Obesity   . Hypercholesterolemia   . Myocardial infarction     hx of  . Hypertension   . Hx of colonic polyp     Past Surgical History  Procedure Date  . Coronary stent placement 2000    x3     Family History  Problem Relation Age of Onset  . Other Father 48    deceased old age  . Coronary artery disease Mother 31    deceased  . Hypertension Mother   . Other Sister 15    living and healthy  . Coronary artery disease Brother   . Skin cancer Brother   . Cancer      family hx of  . Diabetes      family hx of  . Coronary artery disease      family hx of male < 94  . Arthritis      family hx of  . Asthma      family hx of    History  Substance Use Topics  . Smoking status:  Former Smoker    Quit date: 05/23/1990  . Smokeless tobacco: Not on file   Comment: smoked 2 packs per day for 20 years  . Alcohol Use: No      Review of Systems  All other systems reviewed and are negative.   10 Systems reviewed and are negative for acute change except as noted in the HPI.  Allergies  Review of patient's allergies indicates no known allergies.  Home Medications   Current Outpatient Rx  Name Route Sig Dispense Refill  . ASPIRIN EC 81 MG PO TBEC Oral Take 1 tablet (81 mg total) by mouth daily. 150 tablet 2  . ATORVASTATIN CALCIUM 40 MG PO TABS Oral Take 1 tablet (40 mg total) by mouth daily. 90 tablet 3  . ENALAPRIL MALEATE 2.5 MG PO TABS Oral Take 1 tablet (2.5 mg total) by mouth daily. 90 tablet 3  . METOPROLOL SUCCINATE ER 50 MG PO TB24 Oral Take 1 tablet (50 mg total) by mouth daily. 90 tablet 3  . NITROGLYCERIN 0.4 MG SL SUBL Sublingual Place 1 tablet (0.4 mg total) under the tongue every 5 (five) minutes as needed for chest pain. 25 tablet 6    BP 190/90  Pulse 78  Temp(Src) 97.7 F (36.5 C) (Oral)  Resp 16  Ht 5\' 7"  (1.702 m)  Wt 215 lb (97.523 kg)  BMI 33.67 kg/m2  SpO2 96%  Physical Exam  Nursing note and vitals reviewed. Constitutional: He is oriented to person, place, and time. He appears well-developed and well-nourished.  HENT:  Head: Normocephalic and atraumatic.  Right Ear: External ear normal.  Left Ear: External ear normal.  Nose: Nose normal.  Eyes: Conjunctivae and EOM are normal. No scleral icterus.  Neck: Neck supple. No thyromegaly present.  Cardiovascular: Normal rate and regular rhythm.  Exam reveals no gallop and no friction rub.   No murmur heard. Pulmonary/Chest: No stridor. He has no wheezes. He has no rales. He exhibits no tenderness.  Abdominal: He exhibits no distension. There is no tenderness. There is no rebound.  Musculoskeletal: Normal range of motion. He exhibits no edema.       Lower back pain with radiation  through the buttocks to the right heel.   Lymphadenopathy:    He has no cervical adenopathy.  Neurological: He is alert and oriented to person, place, and time. Coordination normal.       Sciatica.   Skin: Skin is warm and dry. Rash (Diffuse over face, torso, and arms. ) noted. No erythema.  Psychiatric: He has a normal mood and affect. His behavior is normal.    ED Course  Procedures (including critical care time)  DIAGNOSTIC STUDIES: Oxygen Saturation is 96% on room air, normal by my interpretation.    COORDINATION OF CARE:  Date: 08/09/2011  0019  Rate: 68  Rhythm: normal sinus rhythm and premature ventricular contractions (PVC)  QRS Axis: normal  Intervals: normal  ST/T Wave abnormalities: normal  Conduction Disutrbances:none  Narrative Interpretation:   Old EKG Reviewed: unchanged c/w 05/13/98  Dg Eye Foreign Body  08/09/2011  *RADIOLOGY REPORT*  Clinical Data: Metal exposure to the lies, pre MRI.  ORBITS FOR FOREIGN BODY - 2 VIEW  Comparison: 05/30/2003 c t head  Findings: No significant abnormal metal foreign body projects over the orbits to indicate a contraindication to MRI.  Visualized paranasal sinuses appear clear.  IMPRESSION:  1. No significant abnormal metal foreign body projects over the orbits to indicate a contraindication to MRI.  Original Report Authenticated By: Dellia Cloud, M.D.   Dg Chest Port 1 View  08/09/2011  *RADIOLOGY REPORT*  Clinical Data: SOB  PORTABLE CHEST - 1 VIEW  Comparison: 07/10/2006  Findings: The heart size and mediastinal contours are within normal limits. Lung volumes are low.  Both lungs are clear.  The visualized skeletal structures are unremarkable.  IMPRESSION: Negative exam.  Original Report Authenticated By: Rosealee Albee, M.D.     Labs Reviewed - No data to display No results found.   No diagnosis found.  12:03AM- EDP at bedside discusses treatment plan concerning pain management. .   MDM  Patient with recent  diagnosis of sciatica who has had an allergic reaction to percocet that has manifested as a rash, itching and difficulty breathing. Given IVF, steroids, benadryl, analgesics and antiemetic with relief. Patient is scheduled for MRI  Of LS spine3/19/13. Chest xray was negative for acute process, EKG unremarkable. Pt feels improved after observation and/or treatment in ED.Pt stable in ED with no significant deterioration in condition.The patient appears reasonably screened and/or stabilized for discharge and I doubt any other medical condition or other [facility] requiring further screening, evaluation, or treatment in the ED at this time  prior to discharge.  I personally performed the services described in this documentation, which was scribed in my presence. The recorded information has been reviewed and considered.   MDM Reviewed: nursing note and vitals Reviewed previous: x-ray Interpretation: x-ray and ECG              Nicoletta Dress. Colon Branch, MD 08/09/11 [phone]

## 2011-08-08 NOTE — ED Notes (Signed)
States he was prescribed percocet and robaxin for back pain last week and he started itching this am

## 2011-08-09 ENCOUNTER — Ambulatory Visit (HOSPITAL_COMMUNITY)
Admission: RE | Admit: 2011-08-09 | Discharge: 2011-08-09 | Disposition: A | Discharge status: Home / Self Care | Payer: Medicare Other | Source: Ambulatory Visit | Attending: Internal Medicine | Admitting: Internal Medicine

## 2011-08-09 ENCOUNTER — Emergency Department (HOSPITAL_COMMUNITY): Payer: Medicare Other

## 2011-08-09 ENCOUNTER — Other Ambulatory Visit: Payer: Self-pay

## 2011-08-09 ENCOUNTER — Other Ambulatory Visit (HOSPITAL_COMMUNITY): Payer: Self-pay | Admitting: Internal Medicine

## 2011-08-09 DIAGNOSIS — M545 Low back pain, unspecified: Secondary | ICD-10-CM | POA: Insufficient documentation

## 2011-08-09 DIAGNOSIS — M5137 Other intervertebral disc degeneration, lumbosacral region: Secondary | ICD-10-CM | POA: Insufficient documentation

## 2011-08-09 DIAGNOSIS — M51379 Other intervertebral disc degeneration, lumbosacral region without mention of lumbar back pain or lower extremity pain: Secondary | ICD-10-CM | POA: Insufficient documentation

## 2011-08-09 DIAGNOSIS — M5380 Other specified dorsopathies, site unspecified: Secondary | ICD-10-CM | POA: Insufficient documentation

## 2011-08-09 MED ORDER — HYDROMORPHONE HCL PF 1 MG/ML IJ SOLN
1.0000 mg | Freq: Once | INTRAMUSCULAR | Status: AC
  Administered 2011-08-09: 1 mg via INTRAVENOUS

## 2011-08-09 MED ORDER — HYDROMORPHONE HCL PF 2 MG/ML IJ SOLN
INTRAMUSCULAR | Status: AC
  Filled 2011-08-09: qty 1

## 2011-08-09 MED ORDER — PREDNISONE 10 MG PO TABS: 20.0000 mg | ORAL_TABLET | Freq: Every day | ORAL | Status: AC

## 2011-08-09 MED ORDER — HYDROMORPHONE HCL PF 1 MG/ML IJ SOLN: 1.0000 mg | Freq: Once | INTRAMUSCULAR | Status: AC

## 2011-08-09 MED ORDER — DIPHENHYDRAMINE HCL 25 MG PO CAPS
50.0000 mg | ORAL_CAPSULE | Freq: Once | ORAL | Status: AC
  Administered 2011-08-09: 50 mg via ORAL
  Filled 2011-08-09: qty 2

## 2011-08-09 MED ORDER — SODIUM CHLORIDE 0.9 % IV SOLN
Freq: Once | INTRAVENOUS | Status: AC
  Administered 2011-08-09: via INTRAVENOUS

## 2011-08-09 MED ORDER — HYDROCODONE-ACETAMINOPHEN 5-325 MG PO TABS: 1.0000 | ORAL_TABLET | ORAL | Status: AC | PRN

## 2011-08-09 MED ORDER — CYCLOBENZAPRINE HCL 10 MG PO TABS: 10.0000 mg | ORAL_TABLET | Freq: Two times a day (BID) | ORAL | Status: AC | PRN

## 2011-08-09 MED ORDER — ONDANSETRON HCL 4 MG/2ML IJ SOLN
4.0000 mg | Freq: Once | INTRAMUSCULAR | Status: AC
  Administered 2011-08-09: 4 mg via INTRAVENOUS
  Filled 2011-08-09: qty 2

## 2011-08-09 MED ORDER — HYDROMORPHONE HCL PF 2 MG/ML IJ SOLN
INTRAMUSCULAR | Status: AC
  Administered 2011-08-09: 1 mg
  Filled 2011-08-09: qty 1

## 2011-08-09 MED ORDER — DIAZEPAM 5 MG PO TABS
10.0000 mg | ORAL_TABLET | Freq: Once | ORAL | Status: AC
  Administered 2011-08-09: 10 mg via ORAL
  Filled 2011-08-09: qty 2

## 2011-08-09 MED ORDER — ALBUTEROL SULFATE (5 MG/ML) 0.5% IN NEBU
2.5000 mg | INHALATION_SOLUTION | Freq: Once | RESPIRATORY_TRACT | Status: AC
  Administered 2011-08-09: 2.5 mg via RESPIRATORY_TRACT
  Filled 2011-08-09: qty 0.5

## 2011-08-09 MED ORDER — METHYLPREDNISOLONE SODIUM SUCC 125 MG IJ SOLR
125.0000 mg | Freq: Once | INTRAMUSCULAR | Status: AC
  Administered 2011-08-09: 125 mg via INTRAVENOUS
  Filled 2011-08-09: qty 2

## 2011-08-09 NOTE — Discharge Instructions (Signed)
Make position changes slowly. Use the new medicines as directed. Keep your appointment for your MRI tomorrow. Followup with Dr. Margo Aye.

## 2011-10-18 ENCOUNTER — Ambulatory Visit (INDEPENDENT_AMBULATORY_CARE_PROVIDER_SITE_OTHER): Payer: Medicare Other | Admitting: Cardiology

## 2011-10-18 ENCOUNTER — Encounter: Payer: Self-pay | Admitting: Cardiology

## 2011-10-18 VITALS — BP 158/90 | HR 72 | Ht 68.0 in | Wt 224.1 lb

## 2011-10-18 DIAGNOSIS — E785 Hyperlipidemia, unspecified: Secondary | ICD-10-CM

## 2011-10-18 DIAGNOSIS — I2510 Atherosclerotic heart disease of native coronary artery without angina pectoris: Secondary | ICD-10-CM

## 2011-10-18 DIAGNOSIS — I10 Essential (primary) hypertension: Secondary | ICD-10-CM

## 2011-10-18 DIAGNOSIS — E7800 Pure hypercholesterolemia, unspecified: Secondary | ICD-10-CM

## 2011-10-18 MED ORDER — ENALAPRIL MALEATE 2.5 MG PO TABS: 2.5000 mg | ORAL_TABLET | Freq: Two times a day (BID) | ORAL | Status: DC

## 2011-10-18 MED ORDER — METOPROLOL SUCCINATE ER 50 MG PO TB24: 50.0000 mg | ORAL_TABLET | Freq: Every day | ORAL | Status: DC

## 2011-10-18 MED ORDER — ENALAPRIL MALEATE 2.5 MG PO TABS: 2.5000 mg | ORAL_TABLET | Freq: Every day | ORAL | Status: DC

## 2011-10-18 MED ORDER — ATORVASTATIN CALCIUM 40 MG PO TABS: 40.0000 mg | ORAL_TABLET | Freq: Every day | ORAL | Status: DC

## 2011-10-18 NOTE — Assessment & Plan Note (Signed)
No real new symptoms.  Continue meds

## 2011-10-18 NOTE — Patient Instructions (Signed)
Your physician recommends that you have lab work today: LIPID and LIVER  Your physician wants you to follow-up in: 6 MONTHS with Dr Riley Kill.  You will receive a reminder letter in the mail two months in advance. If you don't receive a letter, please call our office to schedule the follow-up appointment.  Your physician has recommended you make the following change in your medication: INCREASE Vasotec to twice a day

## 2011-10-18 NOTE — Assessment & Plan Note (Signed)
Check lipid and liver.   

## 2011-10-18 NOTE — Assessment & Plan Note (Signed)
Increase vasotec and check BP.  If not well controlled, can increase to 5mg  twice daily.

## 2011-10-18 NOTE — Progress Notes (Signed)
HPI:  Patient is doing very well. He denies any ongoing chest pain. He did have a problem with his back, and subsequent had to see a neurosurgeon Trey Sailors).  He has no new cardiac symptoms.   He also has had some mild arthritic complaints.  However, he has been on lipitor for many years without problems.    Current Outpatient Prescriptions  Medication Sig Dispense Refill  . aspirin EC 81 MG tablet Take 81 mg by mouth daily.      Marland Kitchen atorvastatin (LIPITOR) 40 MG tablet Take 1 tablet (40 mg total) by mouth daily.  90 tablet  3  . enalapril (VASOTEC) 2.5 MG tablet Take 1 tablet (2.5 mg total) by mouth 2 (two) times daily.  180 tablet  3  . metoprolol succinate (TOPROL-XL) 50 MG 24 hr tablet Take 1 tablet (50 mg total) by mouth daily.  90 tablet  3  . nitroGLYCERIN (NITROSTAT) 0.4 MG SL tablet Place 0.4 mg under the tongue every 5 (five) minutes as needed.      Marland Kitchen DISCONTD: atorvastatin (LIPITOR) 40 MG tablet Take 1 tablet (40 mg total) by mouth daily.  90 tablet  3  . DISCONTD: enalapril (VASOTEC) 2.5 MG tablet Take 1 tablet (2.5 mg total) by mouth daily.  90 tablet  3  . DISCONTD: enalapril (VASOTEC) 2.5 MG tablet Take 1 tablet (2.5 mg total) by mouth daily.  90 tablet  3  . DISCONTD: metoprolol (TOPROL-XL) 50 MG 24 hr tablet Take 1 tablet (50 mg total) by mouth daily.  90 tablet  3    Allergies  Allergen Reactions  . Oxycontin (Oxycodone Hcl Er) Shortness Of Breath and Itching    SOB AND ITCING    Past Medical History  Diagnosis Date  . Carpal tunnel syndrome     bilateral  . Coronary artery disease   . Obesity   . Hypercholesterolemia   . Myocardial infarction     hx of  . Hypertension   . Hx of colonic polyp     Past Surgical History  Procedure Date  . Coronary stent placement 2000    x3     Family History  Problem Relation Age of Onset  . Other Father 33    deceased old age  . Coronary artery disease Mother 61    deceased  . Hypertension Mother   . Other Sister 24      living and healthy  . Coronary artery disease Brother   . Skin cancer Brother   . Cancer      family hx of  . Diabetes      family hx of  . Coronary artery disease      family hx of male < 83  . Arthritis      family hx of  . Asthma      family hx of    History   Social History  . Marital Status: Divorced    Spouse Name: N/A    Number of Children: N/A  . Years of Education: N/A   Occupational History  . retired     Psychologist, occupational   Social History Main Topics  . Smoking status: Former Smoker    Quit date: 05/23/1990  . Smokeless tobacco: Not on file   Comment: smoked 2 packs per day for 20 years  . Alcohol Use: No  . Drug Use: No  . Sexually Active: Not on file   Other Topics Concern  . Not on file  Social History Narrative  . No narrative on file    ROS: Please see the HPI.  All other systems reviewed and negative.  PHYSICAL EXAM:  BP 158/90  Pulse 72  Ht 5\' 8"  (1.727 m)  Wt 224 lb 1.9 oz (101.66 kg)  BMI 34.08 kg/m2  General: Well developed, well nourished, in no acute distress. Head:  Normocephalic and atraumatic. Neck: no JVD Lungs: Clear to auscultation and percussion. Heart: Normal S1 and S2.  No murmur, rubs or gallops.  Abdomen:  Normal bowel sounds; soft; non tender; no organomegaly Pulses: Pulses normal in all 4 extremities. Extremities: No clubbing or cyanosis. No edema. Neurologic: Alert and oriented x 3.  EKG:  NSR.  Anterior MI, old.  Nonspecific T wave flattening, with some noted on prior tracings.    ASSESSMENT AND PLAN:

## 2011-10-21 ENCOUNTER — Telehealth: Payer: Self-pay | Admitting: Cardiology

## 2011-10-21 NOTE — Telephone Encounter (Signed)
New msg [facility] wants clarification of enalapril  Ref number [id number] please call them back

## 2011-10-21 NOTE — Telephone Encounter (Signed)
I spoke with the pharmacist and clarified the pt's enalapril instructions.

## 2011-11-29 ENCOUNTER — Encounter: Payer: Self-pay | Admitting: Cardiology

## 2011-11-29 ENCOUNTER — Ambulatory Visit (INDEPENDENT_AMBULATORY_CARE_PROVIDER_SITE_OTHER): Payer: Medicare Other | Admitting: Cardiology

## 2011-11-29 VITALS — BP 154/90 | HR 72 | Ht 68.0 in | Wt 224.0 lb

## 2011-11-29 DIAGNOSIS — R200 Anesthesia of skin: Secondary | ICD-10-CM

## 2011-11-29 DIAGNOSIS — E7800 Pure hypercholesterolemia, unspecified: Secondary | ICD-10-CM

## 2011-11-29 DIAGNOSIS — I10 Essential (primary) hypertension: Secondary | ICD-10-CM

## 2011-11-29 DIAGNOSIS — R209 Unspecified disturbances of skin sensation: Secondary | ICD-10-CM

## 2011-11-29 NOTE — Patient Instructions (Signed)
Your physician has requested that you regularly monitor and record your blood pressure readings at home. Please use the same machine at the same time of day to check your readings and record them to bring to your follow-up visit.  Your physician wants you to follow-up in: 6 MONTHS with Dr Riley Kill. You will receive a reminder letter in the mail two months in advance. If you don't receive a letter, please call our office to schedule the follow-up appointment.  Please follow-up with dentist.

## 2011-11-29 NOTE — Progress Notes (Signed)
HPI:  Patient is stable. He comes in today because he has had some numbness around his mouth.  It is on both sides.  It has been associated with dryness of the mouth.  He does not note any obvious lesions in the mouth and he has discussed with Dr. Margo Aye.  He has been on most of these meds for quite some time.  He denies any lumps or masses in the neck.  He denies any chest pain.    Current Outpatient Prescriptions  Medication Sig Dispense Refill  . aspirin EC 81 MG tablet Take 81 mg by mouth daily.      . enalapril (VASOTEC) 2.5 MG tablet Take 1 tablet (2.5 mg total) by mouth 2 (two) times daily.  180 tablet  3  . metoprolol succinate (TOPROL-XL) 50 MG 24 hr tablet Take 1 tablet (50 mg total) by mouth daily.  90 tablet  3  . nitroGLYCERIN (NITROSTAT) 0.4 MG SL tablet Place 0.4 mg under the tongue every 5 (five) minutes as needed.      . Pitavastatin Calcium (LIVALO) 4 MG TABS Take 1 tablet by mouth daily.        Allergies  Allergen Reactions  . Oxycontin (Oxycodone Hcl Er) Shortness Of Breath and Itching    SOB AND ITCING    Past Medical History  Diagnosis Date  . Carpal tunnel syndrome     bilateral  . Coronary artery disease   . Obesity   . Hypercholesterolemia   . Myocardial infarction     hx of  . Hypertension   . Hx of colonic polyp     Past Surgical History  Procedure Date  . Coronary stent placement 2000    x3     Family History  Problem Relation Age of Onset  . Other Father 21    deceased old age  . Coronary artery disease Mother 74    deceased  . Hypertension Mother   . Other Sister 59    living and healthy  . Coronary artery disease Brother   . Skin cancer Brother   . Cancer      family hx of  . Diabetes      family hx of  . Coronary artery disease      family hx of male < 70  . Arthritis      family hx of  . Asthma      family hx of    History   Social History  . Marital Status: Divorced    Spouse Name: N/A    Number of Children: N/A  .  Years of Education: N/A   Occupational History  . retired     Psychologist, occupational   Social History Main Topics  . Smoking status: Former Smoker    Quit date: 05/23/1990  . Smokeless tobacco: Not on file   Comment: smoked 2 packs per day for 20 years  . Alcohol Use: No  . Drug Use: No  . Sexually Active: Not on file   Other Topics Concern  . Not on file   Social History Narrative  . No narrative on file    ROS: Please see the HPI.  All other systems reviewed and negative.  PHYSICAL EXAM:  BP 154/90  Pulse 72  Ht 5\' 8"  (1.727 m)  Wt 224 lb (101.606 kg)  BMI 34.06 kg/m2  General: Well developed, well nourished, in no acute distress. Head:  Normocephalic and atraumatic. Neck: no JVD Lungs: Clear to  auscultation and percussion. Heart: Normal S1 and S2.  No murmur, rubs or gallops.  Abdomen:  Normal bowel sounds; soft; non tender; no organomegaly Pulses: Pulses normal in all 4 extremities. Extremities: No clubbing or cyanosis. No edema. Neurologic: Alert and oriented x 3.  EKG:  ASSESSMENT AND PLAN:

## 2011-12-01 DIAGNOSIS — R200 Anesthesia of skin: Secondary | ICD-10-CM | POA: Insufficient documentation

## 2011-12-01 NOTE — Assessment & Plan Note (Signed)
I do not have a specific cause for this.  He does not have any obvious carotid bruits.  I did suggest he consider seeing his dentist for a complete exam. Sjogrens might be a a consideration although he does not have other symptoms.  Some consideration might be given to rheum eval.

## 2011-12-01 NOTE — Assessment & Plan Note (Signed)
Still not well controlled.  I have asked him to get a cuff, and suggested some changes we might be able to make if it remains elevated.

## 2011-12-04 NOTE — Assessment & Plan Note (Signed)
Followed by Dr. Margo Aye.  TS

## 2012-10-10 ENCOUNTER — Telehealth: Payer: Self-pay | Admitting: Cardiology

## 2012-10-10 NOTE — Telephone Encounter (Signed)
New problem    Pt wants to know who he would be seeing since dr Riley Kill retired-pt did not want to see PA until

## 2012-10-10 NOTE — Telephone Encounter (Signed)
Per Dr Stuckey he would recommend Dr McAlhany.  

## 2012-10-12 ENCOUNTER — Telehealth: Payer: Self-pay | Admitting: Cardiovascular Disease

## 2012-10-12 DIAGNOSIS — I2510 Atherosclerotic heart disease of native coronary artery without angina pectoris: Secondary | ICD-10-CM

## 2012-10-12 DIAGNOSIS — E7800 Pure hypercholesterolemia, unspecified: Secondary | ICD-10-CM

## 2012-10-12 DIAGNOSIS — I10 Essential (primary) hypertension: Secondary | ICD-10-CM

## 2012-10-12 NOTE — Telephone Encounter (Signed)
Attempted to reach pt x 2 but phone line is busy.

## 2012-10-12 NOTE — Telephone Encounter (Signed)
Attempted to reach pt again but phone line is still busy.

## 2012-10-12 NOTE — Telephone Encounter (Signed)
Pt scheduled to see Dr Clifton James on 12/03/12.

## 2012-10-12 NOTE — Telephone Encounter (Signed)
New Problem:    Patient called in needing assistance receiving written prescriptions for his medications.  Please call back.

## 2012-10-16 MED ORDER — ENALAPRIL MALEATE 5 MG PO TABS: 5.0000 mg | ORAL_TABLET | Freq: Every day | ORAL | Status: DC

## 2012-10-16 MED ORDER — METOPROLOL SUCCINATE ER 50 MG PO TB24: 50.0000 mg | ORAL_TABLET | Freq: Every day | ORAL | Status: DC

## 2012-10-16 NOTE — Telephone Encounter (Signed)
Left message on machine for pt to contact the office.   

## 2012-10-16 NOTE — Telephone Encounter (Signed)
I spoke with the pt and he needs a Rx sent to the pharmacy for Enalapril and Metoprolol Succinate. Rx sent, the pt will establish with Dr Clifton James in July.

## 2012-10-17 ENCOUNTER — Encounter: Payer: Self-pay | Admitting: Cardiology

## 2012-10-19 ENCOUNTER — Telehealth: Payer: Self-pay | Admitting: Cardiology

## 2012-10-19 NOTE — Telephone Encounter (Signed)
Rec'd from Dr. Dwana Melena [facility] forward 4 pages to Dr. Riley Kill 10/19/12 js,

## 2012-12-03 ENCOUNTER — Ambulatory Visit: Payer: Medicare Other | Admitting: Cardiovascular Disease

## 2013-09-17 DIAGNOSIS — M25519 Pain in unspecified shoulder: Secondary | ICD-10-CM | POA: Insufficient documentation

## 2014-09-14 ENCOUNTER — Emergency Department (HOSPITAL_COMMUNITY): Payer: Medicare Other

## 2014-09-14 ENCOUNTER — Encounter (HOSPITAL_COMMUNITY): Payer: Self-pay | Admitting: *Deleted

## 2014-09-14 ENCOUNTER — Emergency Department (HOSPITAL_COMMUNITY)
Admission: EM | Admit: 2014-09-14 | Discharge: 2014-09-14 | Disposition: A | Discharge status: Home / Self Care | Payer: Medicare Other | Attending: Emergency Medicine | Admitting: Emergency Medicine

## 2014-09-14 DIAGNOSIS — E669 Obesity, unspecified: Secondary | ICD-10-CM | POA: Insufficient documentation

## 2014-09-14 DIAGNOSIS — E780 Pure hypercholesterolemia: Secondary | ICD-10-CM | POA: Diagnosis not present

## 2014-09-14 DIAGNOSIS — Z8669 Personal history of other diseases of the nervous system and sense organs: Secondary | ICD-10-CM | POA: Insufficient documentation

## 2014-09-14 DIAGNOSIS — I10 Essential (primary) hypertension: Secondary | ICD-10-CM | POA: Diagnosis not present

## 2014-09-14 DIAGNOSIS — I252 Old myocardial infarction: Secondary | ICD-10-CM | POA: Diagnosis not present

## 2014-09-14 DIAGNOSIS — Z87891 Personal history of nicotine dependence: Secondary | ICD-10-CM | POA: Diagnosis not present

## 2014-09-14 DIAGNOSIS — I2510 Atherosclerotic heart disease of native coronary artery without angina pectoris: Secondary | ICD-10-CM | POA: Diagnosis not present

## 2014-09-14 DIAGNOSIS — R079 Chest pain, unspecified: Secondary | ICD-10-CM | POA: Diagnosis present

## 2014-09-14 DIAGNOSIS — Z86010 Personal history of colonic polyps: Secondary | ICD-10-CM | POA: Diagnosis not present

## 2014-09-14 DIAGNOSIS — Z7982 Long term (current) use of aspirin: Secondary | ICD-10-CM | POA: Insufficient documentation

## 2014-09-14 DIAGNOSIS — Z79899 Other long term (current) drug therapy: Secondary | ICD-10-CM | POA: Insufficient documentation

## 2014-09-14 DIAGNOSIS — R0789 Other chest pain: Secondary | ICD-10-CM | POA: Diagnosis not present

## 2014-09-14 LAB — CBC WITH DIFFERENTIAL/PLATELET
BASOS ABS: 0 10*3/uL (ref 0.0–0.1)
BASOS PCT: 0 % (ref 0–1)
EOS ABS: 0.3 10*3/uL (ref 0.0–0.7)
EOS PCT: 4 % (ref 0–5)
HCT: 44.1 % (ref 39.0–52.0)
Hemoglobin: 15.1 g/dL (ref 13.0–17.0)
LYMPHS ABS: 1.5 10*3/uL (ref 0.7–4.0)
LYMPHS PCT: 21 % (ref 12–46)
MCH: 30.8 pg (ref 26.0–34.0)
MCHC: 34.2 g/dL (ref 30.0–36.0)
MCV: 89.8 fL (ref 78.0–100.0)
MONOS PCT: 6 % (ref 3–12)
Monocytes Absolute: 0.4 10*3/uL (ref 0.1–1.0)
Neutro Abs: 4.9 10*3/uL (ref 1.7–7.7)
Neutrophils Relative %: 69 % (ref 43–77)
Platelets: 186 10*3/uL (ref 150–400)
RBC: 4.91 MIL/uL (ref 4.22–5.81)
RDW: 13.5 % (ref 11.5–15.5)
WBC: 7.1 10*3/uL (ref 4.0–10.5)

## 2014-09-14 LAB — I-STAT TROPONIN, ED
TROPONIN I, POC: 0 ng/mL (ref 0.00–0.08)
TROPONIN I, POC: 0 ng/mL (ref 0.00–0.08)

## 2014-09-14 LAB — BRAIN NATRIURETIC PEPTIDE: B NATRIURETIC PEPTIDE 5: 80.5 pg/mL (ref 0.0–100.0)

## 2014-09-14 LAB — BASIC METABOLIC PANEL
Anion gap: 8 (ref 5–15)
BUN: 18 mg/dL (ref 6–23)
CHLORIDE: 104 mmol/L (ref 96–112)
CO2: 25 mmol/L (ref 19–32)
CREATININE: 1.22 mg/dL (ref 0.50–1.35)
Calcium: 9.4 mg/dL (ref 8.4–10.5)
GFR calc Af Amer: 69 mL/min — ABNORMAL LOW (ref >90)
GFR, EST NON AFRICAN AMERICAN: 59 mL/min — AB (ref >90)
Glucose, Bld: 103 mg/dL — ABNORMAL HIGH (ref 70–99)
Potassium: 4.2 mmol/L (ref 3.5–5.1)
SODIUM: 137 mmol/L (ref 135–145)

## 2014-09-14 MED ORDER — ACETAMINOPHEN 500 MG PO TABS
1000.0000 mg | ORAL_TABLET | Freq: Once | ORAL | Status: AC
  Administered 2014-09-14: 1000 mg via ORAL
  Filled 2014-09-14: qty 2

## 2014-09-14 MED ORDER — TRAMADOL HCL 50 MG PO TABS: 50.0000 mg | ORAL_TABLET | Freq: Four times a day (QID) | ORAL | Status: DC | PRN

## 2014-09-14 NOTE — ED Notes (Signed)
Pt reports mid chest pains that started yesterday, mild sob. ekg done at triage, airway intact.

## 2014-09-14 NOTE — Discharge Instructions (Signed)
You may take Tylenol 1000 mg every 6 hours as needed for pain.   Chest Wall Pain Chest wall pain is pain in or around the bones and muscles of your chest. It may take up to 6 weeks to get better. It may take longer if you must stay physically active in your work and activities.  CAUSES  Chest wall pain may happen on its own. However, it may be caused by:  A viral illness like the flu.  Injury.  Coughing.  Exercise.  Arthritis.  Fibromyalgia.  Shingles. HOME CARE INSTRUCTIONS   Avoid overtiring physical activity. Try not to strain or perform activities that cause pain. This includes any activities using your chest or your abdominal and side muscles, especially if heavy weights are used.  Put ice on the sore area.  Put ice in a plastic bag.  Place a towel between your skin and the bag.  Leave the ice on for 15-20 minutes per hour while awake for the first 2 days.  Only take over-the-counter or prescription medicines for pain, discomfort, or fever as directed by your caregiver. SEEK IMMEDIATE MEDICAL CARE IF:   Your pain increases, or you are very uncomfortable.  You have a fever.  Your chest pain becomes worse.  You have new, unexplained symptoms.  You have nausea or vomiting.  You feel sweaty or lightheaded.  You have a cough with phlegm (sputum), or you cough up blood. MAKE SURE YOU:   Understand these instructions.  Will watch your condition.  Will get help right away if you are not doing well or get worse. Document Released: 05/09/2005 Document Revised: 08/01/2011 Document Reviewed: 01/03/2011 [facility] Patient Information 2015 [place], [place]. This information is not intended to replace advice given to you by your health care provider. Make sure you discuss any questions you have with your health care provider.

## 2014-09-14 NOTE — ED Provider Notes (Signed)
TIME SEEN: 12:40 PM  CHIEF COMPLAINT: Chest pain  HPI: Pt is a 69 y.o. male with history of hypertension, hyperlipidemia, coronary artery disease status post MI with 2 stents approximate 18 years ago who presents to the emergency department with complaints of chest pain that started last night and is on the left side of his chest in the center of his chest without radiation. Describes it as a sharp pain and feels like a "pulled muscle". Worse when he moves his neck and stretches his arms out. It is not exertional or pleuritic. It is not associated with shortness of breath despite nursing notes. No nausea, vomiting, diaphoresis or dizziness. States this feels nothing like his prior heart attack. Denies fever or cough. No lower extremity swelling or pain. Pain started yesterday.  ROS: See HPI Constitutional: no fever  Eyes: no drainage  ENT: no runny nose   Cardiovascular:  chest pain  Resp: no SOB  GI: no vomiting GU: no dysuria Integumentary: no rash  Allergy: no hives  Musculoskeletal: no leg swelling  Neurological: no slurred speech ROS otherwise negative  PAST MEDICAL HISTORY/PAST SURGICAL HISTORY:  Past Medical History  Diagnosis Date  . Carpal tunnel syndrome     bilateral  . Coronary artery disease   . Obesity   . Hypercholesterolemia   . Myocardial infarction     hx of  . Hypertension   . Hx of colonic polyp     MEDICATIONS:  Prior to Admission medications   Medication Sig Start Date End Date Taking? Authorizing Provider  aspirin EC 81 MG tablet Take 81 mg by mouth daily.    Historical Provider, MD  enalapril (VASOTEC) 5 MG tablet Take 1 tablet (5 mg total) by mouth daily. 10/16/12   Hillary Bow, MD  metoprolol succinate (TOPROL-XL) 50 MG 24 hr tablet Take 1 tablet (50 mg total) by mouth daily. 10/16/12   Hillary Bow, MD  nitroGLYCERIN (NITROSTAT) 0.4 MG SL tablet Place 0.4 mg under the tongue every 5 (five) minutes as needed.    Historical Provider, MD   Pitavastatin Calcium (LIVALO) 4 MG TABS Take 1 tablet by mouth daily.    Historical Provider, MD    ALLERGIES:  Allergies  Allergen Reactions  . Oxycontin [Oxycodone Hcl] Shortness Of Breath and Itching    SOB AND ITCING    SOCIAL HISTORY:  History  Substance Use Topics  . Smoking status: Former Smoker    Quit date: 05/23/1990  . Smokeless tobacco: Not on file     Comment: smoked 2 packs per day for 20 years  . Alcohol Use: No    FAMILY HISTORY: Family History  Problem Relation Age of Onset  . Other Father 56    deceased old age  . Coronary artery disease Mother 81    deceased  . Hypertension Mother   . Other Sister 45    living and healthy  . Coronary artery disease Brother   . Skin cancer Brother   . Cancer      family hx of  . Diabetes      family hx of  . Coronary artery disease      family hx of male < 53  . Arthritis      family hx of  . Asthma      family hx of    EXAM: BP 123/68 mmHg  Pulse 51  Temp(Src) 97.8 F (36.6 C) (Oral)  Resp 18  Ht 5\' 8"  (1.727 m)  Wt 211 lb (95.709 kg)  BMI 32.09 kg/m2  SpO2 95% CONSTITUTIONAL: Alert and oriented and responds appropriately to questions. Well-appearing; well-nourished, smiling and pleasant, nontoxic HEAD: Normocephalic EYES: Conjunctivae clear, PERRL ENT: normal nose; no rhinorrhea; moist mucous membranes; pharynx without lesions noted NECK: Supple, no meningismus, no LAD  CARD: RRR; S1 and S2 appreciated; no murmurs, no clicks, no rubs, no gallops RESP: Normal chest excursion without splinting or tachypnea; breath sounds clear and equal bilaterally; no wheezes, no rhonchi, no rales, chest wall nontender to palpation, no hypoxia or respiratory distress, speaking full sentences ABD/GI: Normal bowel sounds; non-distended; soft, non-tender, no rebound, no guarding BACK:  The back appears normal and is non-tender to palpation, there is no CVA tenderness EXT: Normal ROM in all joints; non-tender to  palpation; no edema; normal capillary refill; no cyanosis; no calf tenderness or swelling   SKIN: Normal color for age and race; warm NEURO: Moves all extremities equally PSYCH: The patient's mood and manner are appropriate. Grooming and personal hygiene are appropriate.  MEDICAL DECISION MAKING: Patient here with atypical chest pain. He does have a history of coronary artery disease but is adamant that this pain is very different than his prior heart attack. Pain seems to be worse with movement and likely chest wall pain. It is not exertional or pleuritic. He is bradycardic but is on metoprolol. Otherwise he is hemodynamically stable. EKG shows no ischemic changes. Labs unremarkable including first troponin negative. BNP normal. Plan is to repeat second troponin and if negative will discharge home with outpatient follow-up. He is comfortable with this plan. We'll give Tylenol for pain.  ED PROGRESS: Patient has had 2 negative troponins. Chest x-ray is clear. We'll discharge home with outpatient follow-up.  Discussed return precautions. Have advised him to use Tylenol as needed for pain. He verbalizes understanding and is comfortable with plan.      EKG Interpretation  Date/Time:  Sunday September 14 2014 11:34:54 EDT Ventricular Rate:  54 PR Interval:  186 QRS Duration: 88 QT Interval:  410 QTC Calculation: 388 R Axis:   73 Text Interpretation:  Sinus bradycardia Possible Anterior infarct , age undetermined Abnormal ECG No significant change since last tracing Confirmed by WARD,  DO, KRISTEN [id number]) on 09/14/2014 11:40:47 AM        [place], DO 09/14/14 1606

## 2015-06-02 DIAGNOSIS — D239 Other benign neoplasm of skin, unspecified: Secondary | ICD-10-CM | POA: Diagnosis not present

## 2015-06-02 DIAGNOSIS — L570 Actinic keratosis: Secondary | ICD-10-CM | POA: Diagnosis not present

## 2015-06-24 DIAGNOSIS — E782 Mixed hyperlipidemia: Secondary | ICD-10-CM | POA: Diagnosis not present

## 2015-06-24 DIAGNOSIS — E119 Type 2 diabetes mellitus without complications: Secondary | ICD-10-CM | POA: Diagnosis not present

## 2015-06-24 DIAGNOSIS — Z125 Encounter for screening for malignant neoplasm of prostate: Secondary | ICD-10-CM | POA: Diagnosis not present

## 2015-06-25 DIAGNOSIS — E782 Mixed hyperlipidemia: Secondary | ICD-10-CM | POA: Diagnosis not present

## 2015-06-25 DIAGNOSIS — M7730 Calcaneal spur, unspecified foot: Secondary | ICD-10-CM | POA: Diagnosis not present

## 2015-06-25 DIAGNOSIS — I10 Essential (primary) hypertension: Secondary | ICD-10-CM | POA: Diagnosis not present

## 2015-06-25 DIAGNOSIS — I2510 Atherosclerotic heart disease of native coronary artery without angina pectoris: Secondary | ICD-10-CM | POA: Diagnosis not present

## 2015-06-25 DIAGNOSIS — R945 Abnormal results of liver function studies: Secondary | ICD-10-CM | POA: Diagnosis not present

## 2015-06-25 DIAGNOSIS — N400 Enlarged prostate without lower urinary tract symptoms: Secondary | ICD-10-CM | POA: Diagnosis not present

## 2015-06-25 DIAGNOSIS — R944 Abnormal results of kidney function studies: Secondary | ICD-10-CM | POA: Diagnosis not present

## 2015-07-17 DIAGNOSIS — J019 Acute sinusitis, unspecified: Secondary | ICD-10-CM | POA: Diagnosis not present

## 2015-07-17 DIAGNOSIS — H6502 Acute serous otitis media, left ear: Secondary | ICD-10-CM | POA: Diagnosis not present

## 2015-07-17 DIAGNOSIS — R05 Cough: Secondary | ICD-10-CM | POA: Diagnosis not present

## 2015-08-03 ENCOUNTER — Encounter: Payer: Self-pay | Admitting: Internal Medicine

## 2015-08-07 DIAGNOSIS — R944 Abnormal results of kidney function studies: Secondary | ICD-10-CM | POA: Diagnosis not present

## 2015-08-07 DIAGNOSIS — R945 Abnormal results of liver function studies: Secondary | ICD-10-CM | POA: Diagnosis not present

## 2015-08-07 DIAGNOSIS — I10 Essential (primary) hypertension: Secondary | ICD-10-CM | POA: Diagnosis not present

## 2015-09-29 DIAGNOSIS — M545 Low back pain: Secondary | ICD-10-CM | POA: Diagnosis not present

## 2015-09-29 DIAGNOSIS — R310 Gross hematuria: Secondary | ICD-10-CM | POA: Diagnosis not present

## 2015-10-13 ENCOUNTER — Encounter: Payer: Self-pay | Admitting: Internal Medicine

## 2015-12-15 ENCOUNTER — Ambulatory Visit: Payer: Medicare Other | Admitting: Internal Medicine

## 2016-01-04 DIAGNOSIS — R1012 Left upper quadrant pain: Secondary | ICD-10-CM | POA: Diagnosis not present

## 2016-01-06 ENCOUNTER — Encounter (HOSPITAL_COMMUNITY): Payer: Self-pay | Admitting: Emergency Medicine

## 2016-01-06 ENCOUNTER — Emergency Department (HOSPITAL_COMMUNITY)
Admission: EM | Admit: 2016-01-06 | Discharge: 2016-01-07 | Disposition: A | Discharge status: Home / Self Care | Payer: PPO | Attending: Emergency Medicine | Admitting: Emergency Medicine

## 2016-01-06 DIAGNOSIS — M79605 Pain in left leg: Secondary | ICD-10-CM | POA: Diagnosis not present

## 2016-01-06 DIAGNOSIS — M545 Low back pain, unspecified: Secondary | ICD-10-CM

## 2016-01-06 DIAGNOSIS — I10 Essential (primary) hypertension: Secondary | ICD-10-CM | POA: Insufficient documentation

## 2016-01-06 DIAGNOSIS — I2510 Atherosclerotic heart disease of native coronary artery without angina pectoris: Secondary | ICD-10-CM | POA: Diagnosis not present

## 2016-01-06 DIAGNOSIS — Z87891 Personal history of nicotine dependence: Secondary | ICD-10-CM | POA: Diagnosis not present

## 2016-01-06 MED ORDER — DEXAMETHASONE SODIUM PHOSPHATE 10 MG/ML IJ SOLN
10.0000 mg | Freq: Once | INTRAMUSCULAR | Status: AC
  Administered 2016-01-06: 10 mg via INTRAMUSCULAR
  Filled 2016-01-06: qty 1

## 2016-01-06 MED ORDER — KETOROLAC TROMETHAMINE 60 MG/2ML IM SOLN
60.0000 mg | Freq: Once | INTRAMUSCULAR | Status: AC
  Administered 2016-01-06: 60 mg via INTRAMUSCULAR
  Filled 2016-01-06: qty 2

## 2016-01-06 MED ORDER — DIAZEPAM 5 MG/ML IJ SOLN
10.0000 mg | Freq: Once | INTRAMUSCULAR | Status: AC
  Administered 2016-01-06: 10 mg via INTRAMUSCULAR
  Filled 2016-01-06: qty 2

## 2016-01-06 NOTE — ED Notes (Signed)
Pt complains of back pain. He has been seen by his PCP, Dr Nevada Crane this week and diagnosed with a muscle strain. He is tender to his left flank region to palpation, and reports that it was this way once before and he came to the ED for an epidural. He ambulated stiffly but heel to toe. He denies any numbness of his lower extremities, pain upon urination, or other complaints.

## 2016-01-06 NOTE — ED Provider Notes (Signed)
[facility] DEPT Provider Note   CSN: [id number] Arrival date & time: 01/06/16  2200  By signing my name below, I, Georgette Shell, attest that this documentation has been prepared under the direction and in the presence of Rolland Porter, MD. Electronically Signed: Georgette Shell, ED Scribe. 01/07/16. 12:04 AM.  Time Seen 23:31 PM  History   Chief Complaint Chief Complaint  Patient presents with  . Back Pain   HPI Comments: WISDOM CHAVIRA is a 70 y.o. male with h/o MI and HTN who presents to the Emergency Department complaining of sudden onset, constant, 8/10 lower back pain onset 4 days ago. Pt also has associated left leg pain. He reports symptoms came on after he mowed his lawn for several hours. Pt states pain is exacerbated with any movement of his left leg,  ambulating, bearing weight, and standing. Pt was seen by his PCP for these symptoms one day ago and prescribed Flexeril 5mg , Robaxin-750, and Ibuprofen with mild relief. Pt had these symptoms 6 years ago and was given an epidural injection with relief. Pt is not a smoker. He does not drink alcohol. Pt denies numbness, paresthesia, urinary and bowel incontinence, or any other associated symptoms. The pain does not radiate into his LLE, but movement of his LLE causes pain in his left back.   PCP: Merlyn Albert  The history is provided by the patient. No language interpreter was used.    Past Medical History:  Diagnosis Date  . Carpal tunnel syndrome    bilateral  . Coronary artery disease   . Hx of colonic polyp   . Hypercholesterolemia   . Hypertension   . Myocardial infarction ([place])    hx of  . Obesity     Patient Active Problem List   Diagnosis Date Noted  . Numbness around mouth 12/01/2011  . Erectile dysfunction 09/05/2010  . TRIGGER FINGER, LEFT MIDDLE 04/20/2010  . ANAL FISSURE 04/03/2009  . HYPERLIPIDEMIA 04/01/2009  . RECTAL BLEEDING 04/01/2009  . ABDOMINAL PAIN-LLQ 04/01/2009  . ABDOMINAL PAIN, GENERALIZED 03/30/2009  .  OTITIS EXTERNA, LEFT 05/07/2008  . BRONCHITIS, ACUTE 04/21/2008  . CARPAL TUNNEL SYNDROME, BILATERAL 12/25/2007  . COLD SORE 03/12/2007  . CAD 01/01/2007  . HYPERCHOLESTEROLEMIA, PURE 06/19/2006  . OBESITY NOS 06/19/2006  . HYPERTENSION 06/19/2006  . MYOCARDIAL INFARCTION, HX OF 06/19/2006  . COLONIC POLYPS, HX OF 06/19/2006    Past Surgical History:  Procedure Laterality Date  . CORONARY STENT PLACEMENT  2000   x3        Home Medications    Prior to Admission medications   Medication Sig Start Date End Date Taking? Authorizing Provider  aspirin EC 81 MG tablet Take 81 mg by mouth daily.    Historical Provider, MD  enalapril (VASOTEC) 5 MG tablet Take 1 tablet (5 mg total) by mouth daily. 10/16/12   Hillary Bow, MD  metoprolol succinate (TOPROL-XL) 50 MG 24 hr tablet Take 1 tablet (50 mg total) by mouth daily. 10/16/12   Hillary Bow, MD  nitroGLYCERIN (NITROSTAT) 0.4 MG SL tablet Place 0.4 mg under the tongue every 5 (five) minutes as needed.    Historical Provider, MD  Pitavastatin Calcium (LIVALO) 4 MG TABS Take 1 tablet by mouth daily.    Historical Provider, MD  predniSONE (DELTASONE) 20 MG tablet Take 3 po QD x 3d , then 2 po QD x 3d then 1 po QD x 3d 01/07/16   Rolland Porter, MD  traMADol (ULTRAM) 50 MG tablet  Take 1 tablet (50 mg total) by mouth every 6 (six) hours as needed. 09/14/14   Delice Bison Ward, DO    Family History Family History  Problem Relation Age of Onset  . Other Father 98    deceased old age  . Coronary artery disease Mother 25    deceased  . Hypertension Mother   . Other Sister 37    living and healthy  . Coronary artery disease Brother   . Skin cancer Brother   . Cancer      family hx of  . Diabetes      family hx of  . Coronary artery disease      family hx of male < 14  . Arthritis      family hx of  . Asthma      family hx of    Social History Social History  Substance Use Topics  . Smoking status: Former Smoker    Quit  date: 05/23/1990  . Smokeless tobacco: Never Used     Comment: smoked 2 packs per day for 20 years  . Alcohol use No  retired Lives with spouse   Allergies   Oxycontin [oxycodone hcl]   Review of Systems Review of Systems  Musculoskeletal: Positive for arthralgias and back pain.  Neurological: Negative for numbness.  All other systems reviewed and are negative.    Physical Exam Updated Vital Signs BP 176/64 (BP Location: Left Arm)   Pulse 75   Temp 98 F (36.7 C) (Oral)   Resp 20   Ht 5\' 7"  (1.702 m)   Wt 212 lb (96.2 kg)   SpO2 95%   BMI 33.20 kg/m   Vital signs normal except hypertension   Physical Exam  Constitutional: He is oriented to person, place, and time. He appears well-developed and well-nourished.  Non-toxic appearance. He does not appear ill. No distress.  HENT:  Head: Normocephalic and atraumatic.  Right Ear: External ear normal.  Left Ear: External ear normal.  Nose: Nose normal. No mucosal edema or rhinorrhea.  Mouth/Throat: Mucous membranes are normal. No dental abscesses or uvula swelling.  Eyes: Conjunctivae and EOM are normal.  Neck: Normal range of motion and full passive range of motion without pain.  Cardiovascular: Normal rate.   Pulmonary/Chest: Effort normal. No respiratory distress. He has no rhonchi. He exhibits no crepitus.  Abdominal: Normal appearance. There is no rebound.  Musculoskeletal: He exhibits tenderness. He exhibits no edema.       Back:  Nontender midline lumbar spine. Nontender SI joints. Tender in the left paraspinal muscles of lumbar spine with pain on ROM to the right but not the left. Pain with forward flexion. Patellar reflexes are 1+ and equal bilaterally. No pain with SLR on the right, positive on the left.   Neurological: He is alert and oriented to person, place, and time. He has normal strength. No cranial nerve deficit.  Skin: Skin is warm, dry and intact. No rash noted. No erythema. No pallor.  Psychiatric: He  has a normal mood and affect. His speech is normal and behavior is normal. His mood appears not anxious.  Nursing note and vitals reviewed.   ED Treatments / Results  DIAGNOSTIC STUDIES: Oxygen Saturation is 95% on RA, poor by my interpretation.      Procedures Procedures (including critical care time)  Medications Ordered in ED Medications  dexamethasone (DECADRON) injection 10 mg (10 mg Intramuscular Given 01/06/16 2344)  diazepam (VALIUM) injection 10 mg (10 mg  Intramuscular Given 01/06/16 2349)  ketorolac (TORADOL) injection 60 mg (60 mg Intramuscular Given 01/06/16 2347)     Initial Impression / Assessment and Plan / ED Course  I have reviewed the triage vital signs and the nursing notes.  Pertinent labs & imaging results that were available during my care of the patient were reviewed by me and considered in my medical decision making (see chart for details).  Clinical Course   11:39 PM Discussed treatment plan with pt at bedside and pt agreed to plan. He was given IM decadron, Valium, and Toradol. Imaging studies not done at this point.   12:41 AM 01/07/16: Recheck after injections, states pain is doing better. Discussed using ice and heat and restarting prior medications in the morning. Adding steroid to his medical regimen, hopefully these medications will not his pain level down so his oral medications can continue to improve his pain.  Final Clinical Impressions(s) / ED Diagnoses   Final diagnoses:  Left-sided low back pain without sciatica    New Prescriptions New Prescriptions   PREDNISONE (DELTASONE) 20 MG TABLET    Take 3 po QD x 3d , then 2 po QD x 3d then 1 po QD x 3d    Plan discharge  Rolland Porter, MD, FACEP   I personally performed the services described in this documentation, which was scribed in my presence. The recorded information has been reviewed and considered.  Rolland Porter, MD, Barbette Or, MD 01/07/16 0100

## 2016-01-06 NOTE — ED Triage Notes (Signed)
Pt states he cannot sit due to pain.

## 2016-01-06 NOTE — ED Triage Notes (Signed)
Pt c/o lower back pain and left leg pain x 4 days.

## 2016-01-07 DIAGNOSIS — M545 Low back pain: Secondary | ICD-10-CM | POA: Diagnosis not present

## 2016-01-07 DIAGNOSIS — M5440 Lumbago with sciatica, unspecified side: Secondary | ICD-10-CM | POA: Diagnosis not present

## 2016-01-07 MED ORDER — PREDNISONE 20 MG PO TABS: ORAL_TABLET | ORAL | 0 refills | Status: DC

## 2016-01-07 NOTE — Discharge Instructions (Signed)
Use ice and heat on your back for comfort. Continue the medications you already have been prescribed and add the prednisone. Recheck with Dr Nevada Crane if you aren't improving over the next week.

## 2016-01-07 NOTE — ED Notes (Signed)
Dr Tomi Bamberger in to reassess

## 2016-01-07 NOTE — ED Notes (Signed)
Pt out of room ambulating in [place]. His gait is easier and more free in movement, but he reports continued pain

## 2016-01-18 DIAGNOSIS — M545 Low back pain: Secondary | ICD-10-CM | POA: Diagnosis not present

## 2016-02-29 DIAGNOSIS — M25512 Pain in left shoulder: Secondary | ICD-10-CM | POA: Diagnosis not present

## 2016-02-29 DIAGNOSIS — M5136 Other intervertebral disc degeneration, lumbar region: Secondary | ICD-10-CM | POA: Diagnosis not present

## 2016-02-29 DIAGNOSIS — M170 Bilateral primary osteoarthritis of knee: Secondary | ICD-10-CM | POA: Diagnosis not present

## 2016-02-29 DIAGNOSIS — M47817 Spondylosis without myelopathy or radiculopathy, lumbosacral region: Secondary | ICD-10-CM | POA: Diagnosis not present

## 2016-02-29 DIAGNOSIS — G8929 Other chronic pain: Secondary | ICD-10-CM | POA: Diagnosis not present

## 2016-02-29 DIAGNOSIS — M25511 Pain in right shoulder: Secondary | ICD-10-CM | POA: Diagnosis not present

## 2016-04-18 DIAGNOSIS — I10 Essential (primary) hypertension: Secondary | ICD-10-CM | POA: Diagnosis not present

## 2016-04-18 DIAGNOSIS — N400 Enlarged prostate without lower urinary tract symptoms: Secondary | ICD-10-CM | POA: Diagnosis not present

## 2016-04-18 DIAGNOSIS — E119 Type 2 diabetes mellitus without complications: Secondary | ICD-10-CM | POA: Diagnosis not present

## 2016-04-25 DIAGNOSIS — N400 Enlarged prostate without lower urinary tract symptoms: Secondary | ICD-10-CM | POA: Diagnosis not present

## 2016-04-25 DIAGNOSIS — E782 Mixed hyperlipidemia: Secondary | ICD-10-CM | POA: Diagnosis not present

## 2016-04-25 DIAGNOSIS — I10 Essential (primary) hypertension: Secondary | ICD-10-CM | POA: Diagnosis not present

## 2016-05-20 DIAGNOSIS — R05 Cough: Secondary | ICD-10-CM | POA: Diagnosis not present

## 2016-05-20 DIAGNOSIS — J060 Acute laryngopharyngitis: Secondary | ICD-10-CM | POA: Diagnosis not present

## 2016-08-09 ENCOUNTER — Other Ambulatory Visit: Payer: Self-pay | Admitting: Physician Assistant

## 2016-08-09 DIAGNOSIS — C44329 Squamous cell carcinoma of skin of other parts of face: Secondary | ICD-10-CM | POA: Diagnosis not present

## 2016-08-09 DIAGNOSIS — C44122 Squamous cell carcinoma of skin of right eyelid, including canthus: Secondary | ICD-10-CM | POA: Diagnosis not present

## 2016-08-09 DIAGNOSIS — L570 Actinic keratosis: Secondary | ICD-10-CM | POA: Diagnosis not present

## 2016-08-09 DIAGNOSIS — C4492 Squamous cell carcinoma of skin, unspecified: Secondary | ICD-10-CM

## 2016-08-09 HISTORY — DX: Squamous cell carcinoma of skin, unspecified: C44.92

## 2016-11-28 DIAGNOSIS — E119 Type 2 diabetes mellitus without complications: Secondary | ICD-10-CM | POA: Diagnosis not present

## 2016-11-28 DIAGNOSIS — Z1159 Encounter for screening for other viral diseases: Secondary | ICD-10-CM | POA: Diagnosis not present

## 2016-11-28 DIAGNOSIS — N400 Enlarged prostate without lower urinary tract symptoms: Secondary | ICD-10-CM | POA: Diagnosis not present

## 2016-11-28 DIAGNOSIS — I10 Essential (primary) hypertension: Secondary | ICD-10-CM | POA: Diagnosis not present

## 2016-11-30 DIAGNOSIS — N529 Male erectile dysfunction, unspecified: Secondary | ICD-10-CM | POA: Diagnosis not present

## 2016-11-30 DIAGNOSIS — N183 Chronic kidney disease, stage 3 (moderate): Secondary | ICD-10-CM | POA: Diagnosis not present

## 2016-11-30 DIAGNOSIS — N401 Enlarged prostate with lower urinary tract symptoms: Secondary | ICD-10-CM | POA: Diagnosis not present

## 2016-11-30 DIAGNOSIS — E782 Mixed hyperlipidemia: Secondary | ICD-10-CM | POA: Diagnosis not present

## 2016-11-30 DIAGNOSIS — E1122 Type 2 diabetes mellitus with diabetic chronic kidney disease: Secondary | ICD-10-CM | POA: Diagnosis not present

## 2016-11-30 DIAGNOSIS — I10 Essential (primary) hypertension: Secondary | ICD-10-CM | POA: Diagnosis not present

## 2016-12-14 DIAGNOSIS — N183 Chronic kidney disease, stage 3 (moderate): Secondary | ICD-10-CM | POA: Diagnosis not present

## 2017-02-13 DIAGNOSIS — N183 Chronic kidney disease, stage 3 (moderate): Secondary | ICD-10-CM | POA: Diagnosis not present

## 2017-02-13 DIAGNOSIS — I10 Essential (primary) hypertension: Secondary | ICD-10-CM | POA: Diagnosis not present

## 2017-02-13 DIAGNOSIS — Z6832 Body mass index (BMI) 32.0-32.9, adult: Secondary | ICD-10-CM | POA: Diagnosis not present

## 2017-02-24 DIAGNOSIS — I129 Hypertensive chronic kidney disease with stage 1 through stage 4 chronic kidney disease, or unspecified chronic kidney disease: Secondary | ICD-10-CM | POA: Diagnosis not present

## 2017-02-24 DIAGNOSIS — N183 Chronic kidney disease, stage 3 (moderate): Secondary | ICD-10-CM | POA: Diagnosis not present

## 2017-02-24 DIAGNOSIS — Z6831 Body mass index (BMI) 31.0-31.9, adult: Secondary | ICD-10-CM | POA: Diagnosis not present

## 2017-06-21 DIAGNOSIS — I10 Essential (primary) hypertension: Secondary | ICD-10-CM | POA: Diagnosis not present

## 2017-06-21 DIAGNOSIS — E1122 Type 2 diabetes mellitus with diabetic chronic kidney disease: Secondary | ICD-10-CM | POA: Diagnosis not present

## 2017-06-21 DIAGNOSIS — E782 Mixed hyperlipidemia: Secondary | ICD-10-CM | POA: Diagnosis not present

## 2017-06-23 DIAGNOSIS — F5221 Male erectile disorder: Secondary | ICD-10-CM | POA: Diagnosis not present

## 2017-06-23 DIAGNOSIS — E782 Mixed hyperlipidemia: Secondary | ICD-10-CM | POA: Diagnosis not present

## 2017-06-23 DIAGNOSIS — R0989 Other specified symptoms and signs involving the circulatory and respiratory systems: Secondary | ICD-10-CM | POA: Diagnosis not present

## 2017-06-23 DIAGNOSIS — N183 Chronic kidney disease, stage 3 (moderate): Secondary | ICD-10-CM | POA: Diagnosis not present

## 2017-06-23 DIAGNOSIS — N401 Enlarged prostate with lower urinary tract symptoms: Secondary | ICD-10-CM | POA: Diagnosis not present

## 2017-06-23 DIAGNOSIS — I129 Hypertensive chronic kidney disease with stage 1 through stage 4 chronic kidney disease, or unspecified chronic kidney disease: Secondary | ICD-10-CM | POA: Diagnosis not present

## 2017-06-23 DIAGNOSIS — I10 Essential (primary) hypertension: Secondary | ICD-10-CM | POA: Diagnosis not present

## 2017-06-27 ENCOUNTER — Other Ambulatory Visit (HOSPITAL_COMMUNITY): Payer: Self-pay | Admitting: Adult Health Nurse Practitioner

## 2017-06-27 DIAGNOSIS — R0989 Other specified symptoms and signs involving the circulatory and respiratory systems: Secondary | ICD-10-CM

## 2017-07-12 DIAGNOSIS — T1502XA Foreign body in cornea, left eye, initial encounter: Secondary | ICD-10-CM | POA: Diagnosis not present

## 2017-07-13 DIAGNOSIS — L570 Actinic keratosis: Secondary | ICD-10-CM | POA: Diagnosis not present

## 2017-07-13 DIAGNOSIS — L409 Psoriasis, unspecified: Secondary | ICD-10-CM | POA: Diagnosis not present

## 2017-07-13 DIAGNOSIS — D229 Melanocytic nevi, unspecified: Secondary | ICD-10-CM | POA: Diagnosis not present

## 2017-07-19 ENCOUNTER — Ambulatory Visit (HOSPITAL_COMMUNITY): Admission: RE | Admit: 2017-07-19 | Payer: PPO | Source: Ambulatory Visit

## 2017-09-02 DIAGNOSIS — H02135 Senile ectropion of left lower eyelid: Secondary | ICD-10-CM | POA: Diagnosis not present

## 2017-09-02 DIAGNOSIS — H02132 Senile ectropion of right lower eyelid: Secondary | ICD-10-CM | POA: Diagnosis not present

## 2017-09-29 DIAGNOSIS — Z6833 Body mass index (BMI) 33.0-33.9, adult: Secondary | ICD-10-CM | POA: Diagnosis not present

## 2017-09-29 DIAGNOSIS — R1010 Upper abdominal pain, unspecified: Secondary | ICD-10-CM | POA: Diagnosis not present

## 2017-09-29 DIAGNOSIS — R140 Abdominal distension (gaseous): Secondary | ICD-10-CM | POA: Diagnosis not present

## 2017-09-29 DIAGNOSIS — E782 Mixed hyperlipidemia: Secondary | ICD-10-CM | POA: Diagnosis not present

## 2017-10-02 ENCOUNTER — Other Ambulatory Visit (HOSPITAL_COMMUNITY): Payer: Self-pay | Admitting: Adult Health Nurse Practitioner

## 2017-10-02 DIAGNOSIS — R1011 Right upper quadrant pain: Secondary | ICD-10-CM

## 2017-10-02 DIAGNOSIS — R140 Abdominal distension (gaseous): Secondary | ICD-10-CM

## 2017-10-05 ENCOUNTER — Ambulatory Visit (HOSPITAL_COMMUNITY)
Admission: RE | Admit: 2017-10-05 | Discharge: 2017-10-05 | Disposition: A | Discharge status: Home / Self Care | Payer: PPO | Source: Ambulatory Visit | Attending: Adult Health Nurse Practitioner | Admitting: Adult Health Nurse Practitioner

## 2017-10-05 DIAGNOSIS — R0989 Other specified symptoms and signs involving the circulatory and respiratory systems: Secondary | ICD-10-CM | POA: Diagnosis not present

## 2017-10-05 DIAGNOSIS — R140 Abdominal distension (gaseous): Secondary | ICD-10-CM | POA: Diagnosis not present

## 2017-10-05 DIAGNOSIS — K760 Fatty (change of) liver, not elsewhere classified: Secondary | ICD-10-CM | POA: Diagnosis not present

## 2017-10-05 DIAGNOSIS — R1011 Right upper quadrant pain: Secondary | ICD-10-CM | POA: Diagnosis not present

## 2017-10-05 DIAGNOSIS — N281 Cyst of kidney, acquired: Secondary | ICD-10-CM | POA: Diagnosis not present

## 2017-10-05 DIAGNOSIS — I6521 Occlusion and stenosis of right carotid artery: Secondary | ICD-10-CM | POA: Diagnosis not present

## 2017-10-06 ENCOUNTER — Other Ambulatory Visit: Payer: Self-pay

## 2017-10-06 DIAGNOSIS — H02132 Senile ectropion of right lower eyelid: Secondary | ICD-10-CM | POA: Diagnosis not present

## 2017-10-06 DIAGNOSIS — I6521 Occlusion and stenosis of right carotid artery: Secondary | ICD-10-CM

## 2017-10-06 DIAGNOSIS — H02135 Senile ectropion of left lower eyelid: Secondary | ICD-10-CM | POA: Diagnosis not present

## 2017-10-06 DIAGNOSIS — H04221 Epiphora due to insufficient drainage, right lacrimal gland: Secondary | ICD-10-CM | POA: Diagnosis not present

## 2017-10-09 ENCOUNTER — Ambulatory Visit (HOSPITAL_COMMUNITY)
Admission: RE | Admit: 2017-10-09 | Discharge: 2017-10-09 | Disposition: A | Discharge status: Home / Self Care | Payer: PPO | Source: Ambulatory Visit | Attending: Internal Medicine | Admitting: Internal Medicine

## 2017-10-09 DIAGNOSIS — I6521 Occlusion and stenosis of right carotid artery: Secondary | ICD-10-CM | POA: Diagnosis not present

## 2017-10-10 ENCOUNTER — Ambulatory Visit (INDEPENDENT_AMBULATORY_CARE_PROVIDER_SITE_OTHER): Payer: PPO | Admitting: Vascular Surgery

## 2017-10-10 ENCOUNTER — Other Ambulatory Visit: Payer: Self-pay

## 2017-10-10 ENCOUNTER — Encounter: Payer: Self-pay | Admitting: Vascular Surgery

## 2017-10-10 ENCOUNTER — Encounter: Payer: Self-pay | Admitting: *Deleted

## 2017-10-10 VITALS — BP 194/93 | HR 71 | Temp 98.1°F | Resp 20 | Ht 67.0 in | Wt 218.0 lb

## 2017-10-10 DIAGNOSIS — I6521 Occlusion and stenosis of right carotid artery: Secondary | ICD-10-CM
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
## 2017-10-11 ENCOUNTER — Other Ambulatory Visit: Payer: Self-pay | Admitting: *Deleted

## 2017-10-17 ENCOUNTER — Telehealth: Payer: Self-pay | Admitting: *Deleted

## 2017-10-17 NOTE — Telephone Encounter (Signed)
Patient given new day and time for surgery. Instructed to be at [facility] admitting at 5:30 am on 11/06/17 for procedure. All other instructions unchanged and to follow the detailed instructions from the hospital pre-admission department. Verbalized understanding.

## 2017-10-26 ENCOUNTER — Encounter (HOSPITAL_COMMUNITY)
Admission: RE | Admit: 2017-10-26 | Discharge: 2017-10-26 | Disposition: A | Discharge status: Home / Self Care | Payer: PPO | Source: Ambulatory Visit | Attending: Vascular Surgery | Admitting: Vascular Surgery

## 2017-10-26 ENCOUNTER — Encounter (HOSPITAL_COMMUNITY): Payer: Self-pay | Admitting: Vascular Surgery

## 2017-10-26 ENCOUNTER — Emergency Department (HOSPITAL_COMMUNITY): Payer: PPO

## 2017-10-26 ENCOUNTER — Emergency Department (HOSPITAL_COMMUNITY)
Admission: EM | Admit: 2017-10-26 | Discharge: 2017-10-27 | Disposition: A | Discharge status: Home / Self Care | Payer: PPO | Attending: Emergency Medicine | Admitting: Emergency Medicine

## 2017-10-26 ENCOUNTER — Other Ambulatory Visit: Payer: Self-pay

## 2017-10-26 ENCOUNTER — Encounter (HOSPITAL_COMMUNITY): Payer: Self-pay | Admitting: Emergency Medicine

## 2017-10-26 DIAGNOSIS — Z87891 Personal history of nicotine dependence: Secondary | ICD-10-CM | POA: Diagnosis not present

## 2017-10-26 DIAGNOSIS — R531 Weakness: Secondary | ICD-10-CM | POA: Diagnosis not present

## 2017-10-26 DIAGNOSIS — Z79899 Other long term (current) drug therapy: Secondary | ICD-10-CM | POA: Insufficient documentation

## 2017-10-26 DIAGNOSIS — Z135 Encounter for screening for eye and ear disorders: Secondary | ICD-10-CM | POA: Diagnosis not present

## 2017-10-26 DIAGNOSIS — R4182 Altered mental status, unspecified: Secondary | ICD-10-CM | POA: Diagnosis not present

## 2017-10-26 DIAGNOSIS — R001 Bradycardia, unspecified: Secondary | ICD-10-CM | POA: Diagnosis not present

## 2017-10-26 DIAGNOSIS — Z7982 Long term (current) use of aspirin: Secondary | ICD-10-CM | POA: Diagnosis not present

## 2017-10-26 DIAGNOSIS — R519 Headache, unspecified: Secondary | ICD-10-CM

## 2017-10-26 DIAGNOSIS — M542 Cervicalgia: Secondary | ICD-10-CM | POA: Diagnosis not present

## 2017-10-26 DIAGNOSIS — R51 Headache: Secondary | ICD-10-CM | POA: Diagnosis not present

## 2017-10-26 DIAGNOSIS — I10 Essential (primary) hypertension: Secondary | ICD-10-CM | POA: Insufficient documentation

## 2017-10-26 LAB — DIFFERENTIAL
Abs Immature Granulocytes: 0 10*3/uL (ref 0.0–0.1)
BASOS ABS: 0.1 10*3/uL (ref 0.0–0.1)
BASOS PCT: 1 %
EOS PCT: 5 %
Eosinophils Absolute: 0.4 10*3/uL (ref 0.0–0.7)
IMMATURE GRANULOCYTES: 0 %
Lymphocytes Relative: 13 %
Lymphs Abs: 1 10*3/uL (ref 0.7–4.0)
MONO ABS: 0.5 10*3/uL (ref 0.1–1.0)
Monocytes Relative: 7 %
NEUTROS ABS: 5.9 10*3/uL (ref 1.7–7.7)
NEUTROS PCT: 74 %

## 2017-10-26 LAB — COMPREHENSIVE METABOLIC PANEL
ALBUMIN: 3.7 g/dL (ref 3.5–5.0)
ALT: 48 U/L (ref 17–63)
ANION GAP: 6 (ref 5–15)
AST: 28 U/L (ref 15–41)
Alkaline Phosphatase: 48 U/L (ref 38–126)
BUN: 19 mg/dL (ref 6–20)
CHLORIDE: 106 mmol/L (ref 101–111)
CO2: 28 mmol/L (ref 22–32)
Calcium: 9.1 mg/dL (ref 8.9–10.3)
Creatinine, Ser: 1.58 mg/dL — ABNORMAL HIGH (ref 0.61–1.24)
GFR calc non Af Amer: 42 mL/min — ABNORMAL LOW (ref >60)
GFR, EST AFRICAN AMERICAN: 49 mL/min — AB (ref >60)
GLUCOSE: 112 mg/dL — AB (ref 65–99)
Potassium: 5.2 mmol/L — ABNORMAL HIGH (ref 3.5–5.1)
SODIUM: 140 mmol/L (ref 135–145)
Total Bilirubin: 0.8 mg/dL (ref 0.3–1.2)
Total Protein: 6.1 g/dL — ABNORMAL LOW (ref 6.5–8.1)

## 2017-10-26 LAB — CBC
HEMATOCRIT: 43.4 % (ref 39.0–52.0)
HEMOGLOBIN: 14.6 g/dL (ref 13.0–17.0)
MCH: 31 pg (ref 26.0–34.0)
MCHC: 33.6 g/dL (ref 30.0–36.0)
MCV: 92.1 fL (ref 78.0–100.0)
Platelets: 201 10*3/uL (ref 150–400)
RBC: 4.71 MIL/uL (ref 4.22–5.81)
RDW: 13 % (ref 11.5–15.5)
WBC: 7.9 10*3/uL (ref 4.0–10.5)

## 2017-10-26 LAB — PROTIME-INR
INR: 1.04
Prothrombin Time: 13.5 seconds (ref 11.4–15.2)

## 2017-10-26 LAB — I-STAT TROPONIN, ED: Troponin i, poc: 0.01 ng/mL (ref 0.00–0.08)

## 2017-10-26 LAB — APTT: APTT: 25 s (ref 24–36)

## 2017-10-26 MED ORDER — METOCLOPRAMIDE HCL 5 MG/ML IJ SOLN
10.0000 mg | Freq: Once | INTRAMUSCULAR | Status: AC
  Administered 2017-10-26: 10 mg via INTRAVENOUS
  Filled 2017-10-26: qty 2

## 2017-10-26 MED ORDER — DIPHENHYDRAMINE HCL 50 MG/ML IJ SOLN
25.0000 mg | Freq: Once | INTRAMUSCULAR | Status: AC
  Administered 2017-10-26: 25 mg via INTRAVENOUS
  Filled 2017-10-26: qty 1

## 2017-10-26 NOTE — ED Triage Notes (Signed)
Patient complaining of generalized weakness, somnolence, and pain in his head and neck x1 week. Pain worse last night, given 45mg  morphine tablet by wife that was leftover from someone else. Patient states he feels sleepy in triage, grip strength equal, oriented and easily arousable, no arm drift, no slurred speech, no facial droop, no vision changes.

## 2017-10-26 NOTE — ED Notes (Signed)
Pt back from MRI 

## 2017-10-26 NOTE — ED Notes (Signed)
Unsuccessful IV x 2 another nurse to try

## 2017-10-26 NOTE — ED Notes (Signed)
Patient transported to MRI 

## 2017-10-26 NOTE — Progress Notes (Signed)
Upon arrival to pre-op appointment, pt sts that he has been having severe headaches on and off x1 week. Pt sts he did not relay to his doctor, because he thought it was not serious. Pt sts he has been taking excedrin migraine and morphine w/o relief. At present, pt denies h/a, blurred vision, or sensory deficits, but sts he is very dizzy and sleepy. Albin Felling., NP for anesthesia made aware of symptoms, and she suggest to call Dr. Luther Parody office and make them aware. Call placed to [facility], RN for Dr. Donnetta Hutching, and she suggest the pt be evaluated in the ED for stroke precautions. Pt taken to the ED for further evaluation, therefore, PAT appointment was not completed.

## 2017-10-26 NOTE — ED Provider Notes (Signed)
[facility] Provider Note   CSN: [id number] Arrival date & time: 10/26/17  1431     History   Chief Complaint Chief Complaint  Patient presents with  . Altered Mental Status    HPI Jeremy [name] is a 72 y.o. male past medical history of significant carotid stenosis, retention, hyperlipidemia, CAD, presenting to the ED with 1 week of worsening headaches.  Patient reports he was scheduled for preop appointment today for significantly stenosed left carotid artery though mentioned his headaches and was recommended to report to the ED. He states he has been having daily headaches that are mostly left-sided described as sharp with associated photophobia.  He states he feels generally weak and also has some pain in his anterior neck, left side worse than right.  Has been treating headaches at home with Excedrin and leftover morphine per his brother-in-law.  No hx of CVA. Denies unilateral weakness, facial droop, slurred speech, vision changes, chest pain or palpitations. No recent falls or injuries.  The history is provided by the patient.    Past Medical History:  Diagnosis Date  . Carpal tunnel syndrome    bilateral  . Coronary artery disease   . Hx of colonic polyp   . Hypercholesterolemia   . Hypertension   . Myocardial infarction ([place])    hx of  . Obesity     Patient Active Problem List   Diagnosis Date Noted  . Numbness around mouth 12/01/2011  . Erectile dysfunction 09/05/2010  . TRIGGER FINGER, LEFT MIDDLE 04/20/2010  . ANAL FISSURE 04/03/2009  . HYPERLIPIDEMIA 04/01/2009  . RECTAL BLEEDING 04/01/2009  . ABDOMINAL PAIN-LLQ 04/01/2009  . ABDOMINAL PAIN, GENERALIZED 03/30/2009  . OTITIS EXTERNA, LEFT 05/07/2008  . BRONCHITIS, ACUTE 04/21/2008  . CARPAL TUNNEL SYNDROME, BILATERAL 12/25/2007  . COLD SORE 03/12/2007  . CAD 01/01/2007  . HYPERCHOLESTEROLEMIA, PURE 06/19/2006  . OBESITY NOS 06/19/2006  . HYPERTENSION 06/19/2006  .  MYOCARDIAL INFARCTION, HX OF 06/19/2006  . COLONIC POLYPS, HX OF 06/19/2006    Past Surgical History:  Procedure Laterality Date  . CORONARY STENT PLACEMENT  2000   x3         Home Medications    Prior to Admission medications   Medication Sig Start Date End Date Taking? Authorizing Provider  aspirin EC 81 MG tablet Take 81 mg by mouth daily.    [provider]  diclofenac (VOLTAREN) 75 MG EC tablet Take 75 mg by mouth daily.     [provider]  enalapril (VASOTEC) 10 MG tablet Take 10 mg by mouth daily.    [provider]  enalapril (VASOTEC) 5 MG tablet Take 1 tablet (5 mg total) by mouth daily. Patient not taking: Reported on 10/19/2017 10/16/12   Hillary Bow, MD  metoprolol succinate (TOPROL-XL) 50 MG 24 hr tablet Take 1 tablet (50 mg total) by mouth daily. Patient not taking: Reported on 10/19/2017 10/16/12   Hillary Bow, MD  metoprolol tartrate (LOPRESSOR) 50 MG tablet Take 50 mg by mouth 2 (two) times daily.    [provider]  nitroGLYCERIN (NITROSTAT) 0.4 MG SL tablet Place 0.4 mg under the tongue every 5 (five) minutes as needed for chest pain.     [provider]  simvastatin (ZOCOR) 40 MG tablet Take 40 mg by mouth daily.    [provider]    Family History Family History  Problem Relation Age of Onset  . Other Father 72  deceased old age  . Coronary artery disease Mother 53       deceased  . Hypertension Mother   . Other Sister 99       living and healthy  . Coronary artery disease Brother   . Skin cancer Brother   . Cancer Unknown        family hx of  . Diabetes Unknown        family hx of  . Coronary artery disease Unknown        family hx of male < 65  . Arthritis Unknown        family hx of  . Asthma Unknown        family hx of    Social History Social History   Tobacco Use  . Smoking status: Former Smoker    Last attempt to quit: 05/23/1990    Years since quitting: 27.4    . Smokeless tobacco: Never Used  . Tobacco comment: smoked 2 packs per day for 20 years  Substance Use Topics  . Alcohol use: No  . Drug use: No     Allergies   Codeine and Oxycontin [oxycodone hcl]   Review of Systems Review of Systems  Constitutional: Negative for fever.  Eyes: Positive for photophobia. Negative for pain and visual disturbance.  Neurological: Positive for headaches. Negative for syncope, facial asymmetry, speech difficulty, weakness and numbness.  Hematological: Does not bruise/bleed easily.  Psychiatric/Behavioral: Negative for confusion.  All other systems reviewed and are negative.    Physical Exam Updated Vital Signs BP (!) 165/69   Pulse (!) 50   Temp 97.8 F (36.6 C) (Oral)   Resp 17   SpO2 100%   Physical Exam  Constitutional: He is oriented to person, place, and time. He appears well-developed and well-nourished. No distress.  HENT:  Head: Normocephalic and atraumatic.  Eyes: Conjunctivae are normal.  Neck: Normal range of motion. Neck supple.  Cardiovascular: Normal rate, regular rhythm and intact distal pulses.  Pulmonary/Chest: Effort normal and breath sounds normal.  Abdominal: Soft. Bowel sounds are normal. There is no tenderness.  Neurological: He is alert and oriented to person, place, and time.  Mental Status:  Alert, oriented, thought content appropriate, able to give a coherent history. Speech fluent without evidence of aphasia. Able to follow 2 step commands without difficulty.  Cranial Nerves:  II:  Peripheral visual fields grossly normal, pupils equal, round, reactive to light III,IV, VI: ptosis not present, extra-ocular motions intact bilaterally  V,VII: smile symmetric, facial light touch sensation equal VIII: hearing grossly normal to voice  X: uvula elevates symmetrically  XI: bilateral shoulder shrug symmetric and strong XII: midline tongue extension without fassiculations Motor:  Normal tone. 5/5 in upper and lower  extremities bilaterally including strong and equal grip strength and dorsiflexion/plantar flexion Sensory: Pinprick and light touch normal in all extremities.  Deep Tendon Reflexes: 2+ and symmetric in the biceps and patella Cerebellar: normal finger-to-nose with bilateral upper extremities Gait: normal gait and balance CV: distal pulses palpable throughout    Skin: Skin is warm.  Psychiatric: He has a normal mood and affect. His behavior is normal.  Nursing note and vitals reviewed.    ED Treatments / Results  Labs (all labs ordered are listed, but only abnormal results are displayed) Labs Reviewed  COMPREHENSIVE METABOLIC PANEL - Abnormal; Notable for the following components:      Result Value   Potassium 5.2 (*)    Glucose, Bld 112 (*)  Creatinine, Ser 1.58 (*)    Total Protein 6.1 (*)    GFR calc non Af Amer 42 (*)    GFR calc Af Amer 49 (*)    All other components within normal limits  PROTIME-INR  APTT  CBC  DIFFERENTIAL  I-STAT TROPONIN, ED  CBG MONITORING, ED    EKG EKG Interpretation  Date/Time:  Thursday October 26 2017 14:42:05 EDT Ventricular Rate:  48 PR Interval:  216 QRS Duration: 116 QT Interval:  446 QTC Calculation: 398 R Axis:   -2 Text Interpretation:  Sinus bradycardia with 1st degree A-V block Cannot rule out Anterior infarct , age undetermined T wave abnormality, consider lateral ischemia Abnormal ECG no change from previous. Confirmed by Jeremy [name] [phone]) on 10/26/2017 10:24:17 PM   Radiology Ct Head Wo Contrast  Result Date: 10/26/2017 CLINICAL DATA:  Initial evaluation for acute altered mental status. EXAM: CT HEAD WITHOUT CONTRAST TECHNIQUE: Contiguous axial images were obtained from the base of the skull through the vertex without intravenous contrast. COMPARISON:  Prior CT from 05/30/2003 FINDINGS: Brain: Mildly advanced cerebral atrophy with chronic microvascular ischemic disease. Encephalomalacia involving the right temporal  occipital region including the right periatrial white matter, consistent with remote ischemic infarct. Subcentimeter lucency at the inferior left lentiform nucleus could reflect small remote lacunar infarct versus dilated perivascular space. No acute intracranial hemorrhage. No acute large vessel territory infarct. No mass lesion, midline shift, or mass effect. No hydrocephalus. No extra-axial fluid collection. Vascular: No hyperdense vessel. Scattered vascular calcifications noted within the carotid siphons. Skull: Scalp soft tissues and calvarium within normal limits. Sinuses/Orbits: Globes and orbital soft tissues within normal limits. Mild scattered mucosal thickening within the ethmoidal air cells. Paranasal sinuses are otherwise clear. Small right mastoid effusion, of doubtful significance. Other: None. IMPRESSION: 1. No acute intracranial abnormality. 2. Remote right temporal occipital infarct. 3. Age-related cerebral atrophy with mild chronic small vessel ischemic disease. Electronically Signed   By: Jeannine Boga M.D.   On: 10/26/2017 17:00    Procedures Procedures (including critical care time)  Medications Ordered in ED Medications - No data to display   Initial Impression / Assessment and Plan / ED Course  I have reviewed the triage vital signs and the nursing notes.  Pertinent labs & imaging results that were available during my care of the patient were reviewed by me and considered in my medical decision making (see chart for details).  Clinical Course as of Oct 26 2053  Thu Oct 26, 2017  2002 Patient reevaluated with significant improvement in headache.  Dr. Johnney Killian to evaluate patient for further recommendations/disposition.   [JR]  2031 Dr. Johnney Killian recommending further imaging, recommends consult neurology.   [JR]  2031 Spoke with Dr. Lorraine Lax with neurology, recommends MRI brain.   [JR]    Clinical Course User Index [JR] Fenton Candee, [place] N, PA-C    Pt w hx of  significant carotid artery stenosis, presenting to the ED with complaints of worsening headache x1 week.  Reports headache is associated with photophobia.  States he felt generally weak and tired today.  Reported to preop appointment for carotid endarterectomy and was recommended to report to the ED instead.  On exam, no focal neuro deficits.  Afebrile, no nuchal rigidity.  Labs with mild elevation in creatinine otherwise unremarkable.  CT head without acute pathology.  Headache treated in the ED with Reglan and Benadryl with significant improvement.  Dr. Johnney Killian evaluated patient and recommends further imaging.  Spoke with Dr. Lorraine Lax  with neurology for imaging recommendation, recommends MRI brain without contrast, as well as ESR and CRP.  Dr. Johnney Killian also recommending MRA.  Patient agreeable to plan for further imaging.  Denies claustrophobia or implanted devices.  Care assumed at shift change by [place], PA, pending MRI results. If neg, pt safe for discharge.  Final Clinical Impressions(s) / ED Diagnoses   Final diagnoses:  None    ED Discharge Orders    None       Tyresha Fede, [place] N, PA-C 10/26/17 2224    Jeremy Shanks, MD 10/27/17 1427

## 2017-10-26 NOTE — ED Provider Notes (Signed)
Recent diagnosis of carotid stenosis scheduled for endarterectomy with pre-op today While in pre-op, he became lightheaded, weak, dizzy with HA. Has been having HA's recently No lateralizing weakness, difficult speech, or off balance HA with photophobia today HA with reglan, benadryl - better  Per Dr. Londell Moh, MRI brain MRI/MRA brain ordered and is pending Plan: If negative, discharge home  MRI/MRA shows no acute stroke. Chart reviewed. Pre-op called Dr. Donnetta Hutching prior to patient's transfer to ED who recommended r/o stroke.  Reviewed study with Dr. Leonette Monarch. Reviewed notes.   Re-exam: Neurologic exam nonfocal. He has been ambulatory without HA, dizziness, ataxia or any recurrent symptom.   He is felt stable for discharge home. Patient and spouse are comfortable with discharge.    Charlann Lange, PA-C 10/27/17 0124    Charlesetta Shanks, MD 10/27/17 1428

## 2017-10-26 NOTE — Pre-Procedure Instructions (Signed)
Jeremy [name]  10/26/2017      [facility] [place] [place] [facility] [id number] [place] [address] Phone: [phone] Fax: [phone]    Your procedure is scheduled on Mon., November 06, 2017 from 7:30AM-9:45AM  Report to [facility] Admitting Entrance "A" at 5:30AM  Call this number if you have problems the morning of surgery:  [phone]   Remember:  No food or drinks after midnight on June 16th   Take these medicines the morning of surgery with A SIP OF WATER By 4:30AM: Metoprolol tartrate (LOPRESSOR). If needed NitroGLYCERIN (NITROSTAT) (Notify the nurse if you had to take this medicine).  Follow your doctors instructions regarding your Aspirin.  If no instructions were given by your doctor, then you will need to call the prescribing office office to get instructions.    7 days before surgery (6/10), stop taking all Other Aspirin Products, Vitamins, Fish oils, and Herbal medications. Also stop all NSAIDS i.e. Advil, Ibuprofen, Motrin, Aleve, Anaprox, Naproxen, BC, Goody Powders, and all Supplements. Including: Diclofenac (VOLTAREN)    Do not wear jewelry.  Do not wear lotions, powders, colognes, or deodorant.  Do not shave 48 hours prior to surgery.  Men may shave face.  Do not bring valuables to the hospital.  [facility] is not responsible for any belongings or valuables.  Contacts, dentures or bridgework may not be worn into surgery.  Leave your suitcase in the car.  After surgery it may be brought to your room.  For patients admitted to the hospital, discharge time will be determined by your treatment team.  Patients discharged the day of surgery will not be allowed to drive home.   Special instructions:   [place]- Preparing For Surgery  Before surgery, you can play an important role. Because skin is not sterile, your skin needs to be as free of germs as possible. You can reduce the number of germs on your skin by  washing with CHG (chlorahexidine gluconate) Soap before surgery.  CHG is an antiseptic cleaner which kills germs and bonds with the skin to continue killing germs even after washing.    Oral Hygiene is also important to reduce your risk of infection.  Remember - BRUSH YOUR TEETH THE MORNING OF SURGERY WITH YOUR REGULAR TOOTHPASTE  Please do not use if you have an allergy to CHG or antibacterial soaps. If your skin becomes reddened/irritated stop using the CHG.  Do not shave (including legs and underarms) for at least 48 hours prior to first CHG shower. It is OK to shave your face.  Please follow these instructions carefully.   1. Shower the NIGHT BEFORE SURGERY and the MORNING OF SURGERY with CHG.   2. If you chose to wash your hair, wash your hair first as usual with your normal shampoo.  3. After you shampoo, rinse your hair and body thoroughly to remove the shampoo.  4. Use CHG as you would any other liquid soap. You can apply CHG directly to the skin and wash gently with a scrungie or a clean washcloth.   5. Apply the CHG Soap to your body ONLY FROM THE NECK DOWN.  Do not use on open wounds or open sores. Avoid contact with your eyes, ears, mouth and genitals (private parts). Wash Face and genitals (private parts)  with your normal soap.  6. Wash thoroughly, paying special attention to the area where your surgery will be performed.  7. Thoroughly rinse your body with warm water from the neck down.  8. DO NOT shower/wash with your normal soap after using and rinsing off the CHG Soap.  9. Pat yourself dry with a CLEAN TOWEL.  10. Wear CLEAN PAJAMAS to bed the night before surgery, wear comfortable clothes the morning of surgery  11. Place CLEAN SHEETS on your bed the night of your first shower and DO NOT SLEEP WITH PETS.  Day of Surgery:  Do not apply any deodorants/lotions.  Please wear clean clothes to the hospital/surgery center.   Remember to brush your teeth WITH YOUR  REGULAR TOOTHPASTE.  Please read over the following fact sheets that you were given. Pain Booklet, Coughing and Deep Breathing, MRSA Information and Surgical Site Infection Prevention

## 2017-10-27 ENCOUNTER — Other Ambulatory Visit: Payer: Self-pay | Admitting: *Deleted

## 2017-10-27 ENCOUNTER — Telehealth: Payer: Self-pay | Admitting: Cardiology

## 2017-10-27 ENCOUNTER — Other Ambulatory Visit: Payer: Self-pay

## 2017-10-27 DIAGNOSIS — I6521 Occlusion and stenosis of right carotid artery: Secondary | ICD-10-CM

## 2017-10-27 DIAGNOSIS — Z01810 Encounter for preprocedural cardiovascular examination: Secondary | ICD-10-CM

## 2017-10-27 DIAGNOSIS — R9431 Abnormal electrocardiogram [ECG] [EKG]: Secondary | ICD-10-CM

## 2017-10-27 LAB — SEDIMENTATION RATE: SED RATE: 15 mm/h (ref 0–16)

## 2017-10-27 LAB — C-REACTIVE PROTEIN

## 2017-10-27 MED ORDER — SODIUM CHLORIDE 0.9 % IV SOLN: INTRAVENOUS | Status: DC

## 2017-10-27 MED ORDER — PROCHLORPERAZINE MALEATE 10 MG PO TABS: 10.0000 mg | ORAL_TABLET | Freq: Two times a day (BID) | ORAL | 0 refills | Status: DC | PRN

## 2017-10-27 NOTE — Telephone Encounter (Signed)
New message        [facility] Pre-operative Risk Assessment    Request for surgical clearance:  1. What type of surgery is being performed? Right Carotid Endarterectomy.  2. When is this surgery scheduled? 11/06/2017  3. What type of clearance is required (medical clearance vs. Pharmacy clearance to hold med vs. Both)? BOTH  4. Are there any medications that need to be held prior to surgery and how long?  5. Practice name and name of physician performing surgery? Dr Sherren Mocha Early  6. What is your office phone number [phone]   7.   What is your office fax number [phone]  8.   Anesthesia type (None, local, MAC, general) ? general   Jeremy [name] 10/27/2017, 3:43 PM  _________________________________________________________________   (provider comments below)

## 2017-10-27 NOTE — Progress Notes (Addendum)
Anesthesia Chart Review:  Case:  [id number] Date/Time:  11/06/17 0715   Procedure:  ENDARTERECTOMY CAROTID RIGHT (Right )   Anesthesia type:  General   Pre-op diagnosis:  RIGHT CAROTID STENOSIS   Location:  [facility] OR ROOM 11 / [place] OR   Surgeon:  Rosetta Posner, MD      DISCUSSION: Patient is a 72 year old male scheduled for the above procedure. His PAT visit was initially scheduled for 10/26/17, but he presented with complaints of intermittent severe headaches of the past week not relieved with Excedrin Migraine or morphine.  Headache was associated with photophobia and feeling generally weak.  [facility] was notified and patient was instructed to go to the ED for further evaluation.  ED provider spoke with neurology who recommended an MRI/MRI which did not show acute stroke.  Headache improved with Reglan and Benadryl.  Troponins negative x3. CRP < 0.8. Sed rate 15. Creatinine elevated at 1.58, but no recent comparison labs. K 5.2. Patient was discharged home from the emergency department.  History includes CAD/MI '92 (s/p stentx3 '00), HTN, hypercholesterolemia, former smoker (quit '92).   Records and labs requested from Dr. Nevada Crane to see if elevated creatinine is within patient's baseline.   EKG in ED showed new nonspecific ST abnormality in inferior leads III and aVF and new T wave inversion in leads V4 through 6. Last cardiology visit I see is from 2013. He has known CAD and cardiac stent history. Discussed with anesthesiologist Dr. Oleta Mouse. Would recommend preoperative cardiology evaluation. I have notified Zigmund Daniel at Dr. Luther Parody office.   Chart will be left for follow-up PCP and cardiology records. Preoperative labs were done in the ED except from his UA and T&S. I will ask Zigmund Daniel where she would like him to have his UA done. T&S could be done on the day of surgery if needed. He lives in [place].  ADDENDUM 10/31/17 3:29 PM: 09/29/17 labs received from Dr. Wende Neighbors. BUN 23, Cr 1.24, K 4.2 on 09/29/17. I  will forward 10/26/17 results to Dr. Nevada Crane and enter order for ISTAT8 on 11/06/17, if surgery remains as scheduled. He is scheduled to see Jyl Heinz, MD on 11/03/17 for preoperative evaluation.   VS: Last vitals in ED 10/27/17 1:00 AM: HR 48, BP 124/34, RR 13, O2 sat 96%.   PROVIDERS: Celene Squibb, MD is PCP Last cardiology visit seen was by Bing Quarry, MD on 11/29/11.   LABS: Labs on 10/26/17 from ED visit reviewed. CBC, PT/PTT WNL. Glucose 112. Cr 1.58, BUN 19. K 5.2. (Last Creatinine from comparison that I currently have access to is 1.22 on 09/14/14.)    IMAGES: MRI/MRA head 10/26/17: IMPRESSION: 1. No acute intracranial abnormality. 2. Multifocal moderate-to-severe intracranial stenoses involving the anterior and posterior cerebral arteries. 3. Nonvisualization of the left vertebral artery, indicating either occlusion or early termination at the left PICA, below the imaged field of view. 4. 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 5. 2 mm inferiorly projecting A-comm aneurysm is suspected but somewhat poorly characterized on this study.  EKG: 10/26/17 EKG: SB at 48 bpm, first degree AV block. Cannot rule out anterior infarct (age undetermined). T wave abnormality, consider lateral ischemia.   CV: Right Carotid U/S 10/09/17: Final Interpretation: Right Carotid: Velocities in the right ICA are consistent with a 80-99%        stenosis. Vertebrals: Right vertebral artery demonstrates antegrade flow.  Carotid U/S 10/05/17 IMPRESSION: Right: Heterogeneous and partially calcified plaque at the right carotid bifurcation contributes to 70%-99%  stenosis by established duplex criteria. Left: Heterogeneous and partially calcified plaque at the left carotid bifurcation, with no significant stenosis by established duplex criteria.   Past Medical History:  Diagnosis Date  . Carpal tunnel syndrome    bilateral  . Coronary artery disease   . Hx of colonic polyp   .  Hypercholesterolemia   . Hypertension   . Myocardial infarction ([place])    hx of  . Obesity     Past Surgical History:  Procedure Laterality Date  . CORONARY STENT PLACEMENT  2000   x3     MEDICATIONS: No current facility-administered medications for this encounter.    Marland Kitchen aspirin EC 81 MG tablet  . diclofenac (VOLTAREN) 75 MG EC tablet  . enalapril (VASOTEC) 10 MG tablet  . metoprolol tartrate (LOPRESSOR) 50 MG tablet  . nitroGLYCERIN (NITROSTAT) 0.4 MG SL tablet  . simvastatin (ZOCOR) 40 MG tablet  . enalapril (VASOTEC) 5 MG tablet  . metoprolol succinate (TOPROL-XL) 50 MG 24 hr tablet  . prochlorperazine (COMPAZINE) 10 MG tablet    [facility] Short Stay Center/Anesthesiology Phone [phone] 10/27/2017 2:21 PM

## 2017-10-27 NOTE — ED Notes (Signed)
Pt sign and held ordered for November 06 2017 pre op labs released by error. Blood specimens not collected.

## 2017-10-27 NOTE — Discharge Instructions (Addendum)
Follow up with Dr. Donnetta Hutching as planned. Return to the emergency department with any new or concerning symptoms. Do not give any further morphine from home.

## 2017-10-30 NOTE — Telephone Encounter (Signed)
   He has not been seen by cardiology since 2013. Last seen by Dr. Lia Foyer. He will need a new patient office visit for preoperative clearance. He is scheduled with Dr. Geraldo Pitter on 11/03/17. Dr. Geraldo Pitter will determine clearance. Will remove from preop pool. I will route to Dr. Luther Parody office that clearance is pending.

## 2017-11-03 ENCOUNTER — Encounter (HOSPITAL_COMMUNITY): Payer: Self-pay | Admitting: *Deleted

## 2017-11-03 ENCOUNTER — Encounter: Payer: Self-pay | Admitting: Cardiology

## 2017-11-03 ENCOUNTER — Ambulatory Visit (INDEPENDENT_AMBULATORY_CARE_PROVIDER_SITE_OTHER): Payer: PPO | Admitting: Cardiology

## 2017-11-03 VITALS — BP 140/82 | HR 52 | Ht 67.0 in | Wt 210.0 lb

## 2017-11-03 DIAGNOSIS — E782 Mixed hyperlipidemia: Secondary | ICD-10-CM | POA: Diagnosis not present

## 2017-11-03 DIAGNOSIS — Z01810 Encounter for preprocedural cardiovascular examination: Secondary | ICD-10-CM

## 2017-11-03 DIAGNOSIS — I2510 Atherosclerotic heart disease of native coronary artery without angina pectoris: Secondary | ICD-10-CM | POA: Diagnosis not present

## 2017-11-03 DIAGNOSIS — I779 Disorder of arteries and arterioles, unspecified: Secondary | ICD-10-CM | POA: Diagnosis not present

## 2017-11-03 DIAGNOSIS — I739 Peripheral vascular disease, unspecified: Secondary | ICD-10-CM

## 2017-11-03 NOTE — Patient Instructions (Signed)
Medication Instructions:  Your physician recommends that you continue on your current medications as directed. Please refer to the Current Medication list given to you today.  Labwork: None  Testing/Procedures: Your physician has requested that you have a stress echocardiogram. For further information please visit [email or web address]. Please follow instruction sheet as given.  Follow-Up: Your physician recommends that you schedule a follow-up appointment in: 1 month  Any Other Special Instructions Will Be Listed Below (If Applicable).     If you need a refill on your cardiac medications before your next appointment, please call your pharmacy.   [place], RN, BSN   Exercise Stress Echocardiogram, Care After This sheet gives you information about how to care for yourself after your procedure. Your doctor may also give you more specific instructions. If you have problems or questions, call your doctor. Follow these instructions at home:  You may do these things as told by your doctor: ? Eat what you normally eat. ? Do your normal activities. ? Take your normal medicines.  Take over-the-counter and prescription medicines only as told by your doctor.  Keep all follow-up visits as told by your doctor. This is important. Contact a doctor if:  You keep feeling dizzy or light-headed.  You feel like your heart is beating fast.  You keep feeling sick to your stomach (nauseous) or you throw up (vomit).  You have a headache.  You feel short of breath. Get help right away if:  You have pain or pressure in any of these areas: ? Your chest. ? Your jaw or neck. ? Between your shoulder blades. ? Down your left arm.  You pass out (faint).  You have trouble breathing. Summary  After your procedure, you may eat like normal, do your normal activities, and take your normal medicines as told by your doctor.  Contact your doctor if you have dizziness, a fast  heartbeat, or a headache.  You should also contact your doctor if you feel sick to your stomach (nauseous), you throw up (vomit), or you feel short of breath.  Get help right away if you feel pain or pressure in any of these areas: your chest, jaw, neck, between your shoulder blades, or down your right arm.  You should also get help right away if you pass out (faint) or have trouble breathing. This information is not intended to replace advice given to you by your health care provider. Make sure you discuss any questions you have with your health care provider. Document Released: 02/27/2013 Document Revised: 02/01/2016 Document Reviewed: 02/01/2016 Elsevier Interactive Patient Education  2017 [employer].

## 2017-11-03 NOTE — Progress Notes (Signed)
Cardiology Office Note:    Date:  11/03/2017   ID:  Jeremy [name], DOB 15-Jun-1945, MRN [id number]  PCP:  Celene Squibb, MD  Cardiologist:  Jenean Lindau, MD   Referring MD: Celene Squibb, MD    ASSESSMENT:    1. Pre-operative cardiovascular examination   2. Coronary artery disease involving native coronary artery of native heart without angina pectoris   3. Mixed dyslipidemia   4. Carotid artery disease, unspecified laterality, unspecified type ([place])    PLAN:    In order of problems listed above:  1. I discussed my findings with the patient at extensive length.  Secondary prevention stressed.  Importance of compliance with diet and medication stressed and he vocalized understanding. 2. His blood pressure is stable.  Diet was discussed for dyslipidemia.  Risks of obesity also discussed and weight reduction was stressed. 3. In view of significant vascular disease I mentioned to him about evaluating him with an exercise stress echo.  If this is negative then he is not at high risk for coronary events during the aforementioned surgery.  Meticulous hemodynamic monitoring and continued perioperative beta-blockade will further reduce the risk of coronary events. 4. He will be seen in follow-up appointment in a month or earlier if he has any concerns.   Medication Adjustments/Labs and Tests Ordered: Current medicines are reviewed at length with the patient today.  Concerns regarding medicines are outlined above.  No orders of the defined types were placed in this encounter.  No orders of the defined types were placed in this encounter.    History of Present Illness:    [name] is a 72 y.o. male who is being seen today for the evaluation of preop risk stratification for carotid artery surgery.  At the request of Celene Squibb, MD.  Patient is a pleasant 72 year old male with past medical history of coronary artery disease.  He says he underwent coronary stenting with 2 stents  about 15 years ago subsequently he has done fine.  He is an active gentleman but does not exercise on a regular basis.  He has history of essential hypertension and dyslipidemia and extensive vascular disease including cerebral atherosclerosis documented by magnetic resonance imaging.  The patient mentions to me that he is planning to undergo carotid surgery and is sent here for evaluation for preop assessment.  No orthopnea or PND.  At the time of my evaluation, the patient is alert awake oriented and in no distress.  Past Medical History:  Diagnosis Date  . Carpal tunnel syndrome    bilateral  . Coronary artery disease   . Hx of colonic polyp   . Hypercholesterolemia   . Hypertension   . Myocardial infarction ([place])    hx of  . Obesity     Past Surgical History:  Procedure Laterality Date  . CORONARY STENT PLACEMENT  2000   x3     Current Medications: Current Meds  Medication Sig  . aspirin EC 81 MG tablet Take 81 mg by mouth daily.  . diclofenac (VOLTAREN) 75 MG EC tablet Take 75 mg by mouth daily.   . enalapril (VASOTEC) 10 MG tablet Take 10 mg by mouth daily.  . metoprolol tartrate (LOPRESSOR) 50 MG tablet Take 50 mg by mouth 2 (two) times daily.  . nitroGLYCERIN (NITROSTAT) 0.4 MG SL tablet Place 0.4 mg under the tongue every 5 (five) minutes as needed for chest pain.   Marland Kitchen prochlorperazine (COMPAZINE) 10 MG tablet Take  1 tablet (10 mg total) by mouth 2 (two) times daily as needed (Headache).  . simvastatin (ZOCOR) 40 MG tablet Take 40 mg by mouth daily.     Allergies:   Codeine and Oxycontin [oxycodone hcl]   Social History   Socioeconomic History  . Marital status: Divorced    Spouse name: Not on file  . Number of children: Not on file  . Years of education: Not on file  . Highest education level: Not on file  Occupational History  . Occupation: retired    Comment: Building control surveyor  Social Needs  . Financial resource strain: Not on file  . Food insecurity:    Worry: Not on  file    Inability: Not on file  . Transportation needs:    Medical: Not on file    Non-medical: Not on file  Tobacco Use  . Smoking status: Former Smoker    Last attempt to quit: 05/23/1990    Years since quitting: 27.4  . Smokeless tobacco: Never Used  . Tobacco comment: smoked 2 packs per day for 20 years  Substance and Sexual Activity  . Alcohol use: No  . Drug use: No  . Sexual activity: Not on file  Lifestyle  . Physical activity:    Days per week: Not on file    Minutes per session: Not on file  . Stress: Not on file  Relationships  . Social connections:    Talks on phone: Not on file    Gets together: Not on file    Attends religious service: Not on file    Active member of club or organization: Not on file    Attends meetings of clubs or organizations: Not on file    Relationship status: Not on file  Other Topics Concern  . Not on file  Social History Narrative  . Not on file     Family History: The patient's family history includes Arthritis in his unknown relative; Asthma in his unknown relative; Cancer in his unknown relative; Coronary artery disease in his brother and unknown relative; Coronary artery disease (age of onset: 53) in his mother; Diabetes in his unknown relative; Hypertension in his mother; Other (age of onset: 58) in his father; Other (age of onset: 39) in his sister; Skin cancer in his brother.  ROS:   Please see the history of present illness.    All other systems reviewed and are negative.  EKGs/Labs/Other Studies Reviewed:    The following studies were reviewed today: EKG done revealed sinus rhythm with poor anterior forces suggesting of old myocardial infarction   Recent Labs: 10/26/2017: ALT 48; BUN 19; Creatinine, Ser 1.58; Hemoglobin 14.6; Platelets 201; Potassium 5.2; Sodium 140  Recent Lipid Panel    Component Value Date/Time   CHOL 127 12/26/2007 0121   TRIG 180 (H) 12/26/2007 0121   HDL 36 (L) 12/26/2007 0121   CHOLHDL 3.5 Ratio  12/26/2007 0121   VLDL 36 12/26/2007 0121   LDLCALC 55 12/26/2007 0121    Physical Exam:    VS:  BP 140/82 (BP Location: Left Arm, Patient Position: Sitting, Cuff Size: Normal)   Pulse (!) 52   Ht 5\' 7"  (1.702 m)   Wt 210 lb (95.3 kg)   SpO2 98%   BMI 32.89 kg/m     Wt Readings from Last 3 Encounters:  11/03/17 210 lb (95.3 kg)  10/26/17 212 lb 1.6 oz (96.2 kg)  10/10/17 218 lb (98.9 kg)     GEN: Patient is  in no acute distress HEENT: Normal NECK: No JVD; No carotid bruits LYMPHATICS: No lymphadenopathy CARDIAC: S1 S2 regular, 2/6 systolic murmur at the apex. RESPIRATORY:  Clear to auscultation without rales, wheezing or rhonchi  ABDOMEN: Soft, non-tender, non-distended MUSCULOSKELETAL:  No edema; No deformity  SKIN: Warm and dry NEUROLOGIC:  Alert and oriented x 3 PSYCHIATRIC:  Normal affect    Signed, Jenean Lindau, MD  11/03/2017 9:23 AM    [place]

## 2017-11-06 ENCOUNTER — Encounter (HOSPITAL_COMMUNITY): Admission: RE | Payer: Self-pay | Source: Ambulatory Visit

## 2017-11-06 ENCOUNTER — Inpatient Hospital Stay (HOSPITAL_COMMUNITY): Admission: RE | Admit: 2017-11-06 | Payer: PPO | Source: Ambulatory Visit | Admitting: Vascular Surgery

## 2017-11-06 SURGERY — ENDARTERECTOMY, CAROTID
Anesthesia: General | Laterality: Right

## 2017-11-09 ENCOUNTER — Ambulatory Visit: Payer: PPO | Admitting: Cardiovascular Disease

## 2017-11-10 DIAGNOSIS — G43009 Migraine without aura, not intractable, without status migrainosus: Secondary | ICD-10-CM | POA: Diagnosis not present

## 2017-11-10 DIAGNOSIS — I2510 Atherosclerotic heart disease of native coronary artery without angina pectoris: Secondary | ICD-10-CM | POA: Diagnosis not present

## 2017-11-10 DIAGNOSIS — I6529 Occlusion and stenosis of unspecified carotid artery: Secondary | ICD-10-CM | POA: Diagnosis not present

## 2017-11-10 DIAGNOSIS — Z6832 Body mass index (BMI) 32.0-32.9, adult: Secondary | ICD-10-CM | POA: Diagnosis not present

## 2017-11-28 ENCOUNTER — Encounter: Payer: Self-pay | Admitting: *Deleted

## 2017-11-28 ENCOUNTER — Encounter: Payer: Self-pay | Admitting: Cardiology

## 2017-11-28 ENCOUNTER — Ambulatory Visit (HOSPITAL_BASED_OUTPATIENT_CLINIC_OR_DEPARTMENT_OTHER)
Admission: RE | Admit: 2017-11-28 | Discharge: 2017-11-28 | Disposition: A | Discharge status: Home / Self Care | Payer: PPO | Source: Ambulatory Visit | Attending: Cardiology | Admitting: Cardiology

## 2017-11-28 ENCOUNTER — Other Ambulatory Visit: Payer: Self-pay | Admitting: Cardiology

## 2017-11-28 DIAGNOSIS — I10 Essential (primary) hypertension: Secondary | ICD-10-CM | POA: Diagnosis not present

## 2017-11-28 DIAGNOSIS — E785 Hyperlipidemia, unspecified: Secondary | ICD-10-CM | POA: Diagnosis not present

## 2017-11-28 DIAGNOSIS — I340 Nonrheumatic mitral (valve) insufficiency: Secondary | ICD-10-CM | POA: Insufficient documentation

## 2017-11-28 DIAGNOSIS — Z01810 Encounter for preprocedural cardiovascular examination: Secondary | ICD-10-CM

## 2017-11-28 NOTE — Progress Notes (Signed)
  Echocardiogram 2D Echocardiogram has been performed.   Jeremy [name] 11/28/2017, 9:59 AM

## 2017-11-28 NOTE — Addendum Note (Signed)
Addended by: Austin Miles on: 11/28/2017 09:44 AM   Modules accepted: Orders

## 2017-11-29 DIAGNOSIS — M150 Primary generalized (osteo)arthritis: Secondary | ICD-10-CM | POA: Diagnosis not present

## 2017-11-29 DIAGNOSIS — Z6832 Body mass index (BMI) 32.0-32.9, adult: Secondary | ICD-10-CM | POA: Diagnosis not present

## 2017-11-29 DIAGNOSIS — M791 Myalgia, unspecified site: Secondary | ICD-10-CM | POA: Diagnosis not present

## 2017-12-01 DIAGNOSIS — H04221 Epiphora due to insufficient drainage, right lacrimal gland: Secondary | ICD-10-CM | POA: Diagnosis not present

## 2017-12-05 ENCOUNTER — Telehealth (HOSPITAL_COMMUNITY): Payer: Self-pay | Admitting: *Deleted

## 2017-12-05 ENCOUNTER — Telehealth: Payer: Self-pay | Admitting: Cardiology

## 2017-12-05 NOTE — Telephone Encounter (Signed)
New Message:      Pt is returning a call for the nuclear dept for instructions on his upcoming test

## 2017-12-05 NOTE — Telephone Encounter (Signed)
Left message on voicemail in reference to upcoming appointment scheduled for 12/07/17. Phone number given for a call back so details instructions can be given.  Jeremy [name]

## 2017-12-06 ENCOUNTER — Encounter (HOSPITAL_COMMUNITY): Payer: PPO

## 2017-12-06 ENCOUNTER — Telehealth (HOSPITAL_COMMUNITY): Payer: Self-pay

## 2017-12-06 NOTE — Telephone Encounter (Signed)
Patient given detailed instructions per Myocardial Perfusion Study Information Sheet for the test on 12/07/17 at 0730. Patient notified to arrive 15 minutes early and that it is imperative to arrive on time for appointment to keep from having the test rescheduled.  If you need to cancel or reschedule your appointment, please call the office within 24 hours of your appointment. . Patient verbalized understanding. TMY

## 2017-12-07 ENCOUNTER — Ambulatory Visit (HOSPITAL_COMMUNITY): Payer: PPO | Attending: Cardiology

## 2017-12-07 VITALS — Ht 67.0 in | Wt 210.0 lb

## 2017-12-07 DIAGNOSIS — Z01810 Encounter for preprocedural cardiovascular examination: Secondary | ICD-10-CM | POA: Insufficient documentation

## 2017-12-07 DIAGNOSIS — I2510 Atherosclerotic heart disease of native coronary artery without angina pectoris: Secondary | ICD-10-CM | POA: Diagnosis not present

## 2017-12-07 LAB — MYOCARDIAL PERFUSION IMAGING
CHL CUP NUCLEAR SSS: 12
LHR: 0.34
LV dias vol: 115 mL (ref 62–150)
LV sys vol: 52 mL
NUC STRESS TID: 1.02
Peak HR: 70 {beats}/min
Rest HR: 51 {beats}/min
SDS: 3
SRS: 9

## 2017-12-07 MED ORDER — TECHNETIUM TC 99M TETROFOSMIN IV KIT
10.7000 | PACK | Freq: Once | INTRAVENOUS | Status: AC | PRN
  Administered 2017-12-07: 10.7 via INTRAVENOUS
  Filled 2017-12-07: qty 11

## 2017-12-07 MED ORDER — REGADENOSON 0.4 MG/5ML IV SOLN
0.4000 mg | Freq: Once | INTRAVENOUS | Status: AC
  Administered 2017-12-07: 0.4 mg via INTRAVENOUS

## 2017-12-07 MED ORDER — TECHNETIUM TC 99M TETROFOSMIN IV KIT
31.1000 | PACK | Freq: Once | INTRAVENOUS | Status: AC | PRN
  Administered 2017-12-07: 31.1 via INTRAVENOUS
  Filled 2017-12-07: qty 32

## 2017-12-08 ENCOUNTER — Encounter: Payer: Self-pay | Admitting: *Deleted

## 2017-12-08 ENCOUNTER — Telehealth: Payer: Self-pay | Admitting: Cardiology

## 2017-12-08 NOTE — Telephone Encounter (Signed)
Patient informed of lexiscan results and advised that I would let him know when Dr. Geraldo Pitter has an answer regarding cardiac clearance for surgery. Patient verbalized understanding. No further questions.

## 2017-12-08 NOTE — Telephone Encounter (Signed)
Patient's girlfriend called to ask about results, said they have been waiting on results for several days. I told her she was not on [place] form so we could only speak to patient, stated understanding.

## 2017-12-11 NOTE — Telephone Encounter (Signed)
Called and left a message for Becky, RN to inform her that this patient has been cleared for his carotid artery surgery by Dr. Geraldo Pitter from a cardiac standpoint per patient's request. Left our office number if she has any further questions or concerns. Patient informed of this. No further questions.

## 2017-12-12 ENCOUNTER — Other Ambulatory Visit: Payer: Self-pay | Admitting: Vascular Surgery

## 2017-12-13 ENCOUNTER — Ambulatory Visit: Payer: PPO | Admitting: Cardiology

## 2017-12-14 ENCOUNTER — Encounter: Payer: Self-pay | Admitting: Cardiology

## 2017-12-18 NOTE — Pre-Procedure Instructions (Signed)
KYLIN DUBS  12/18/2017      [facility] [place], [place] - [id number] [place] [address] Phone: [phone] Fax: [phone]  [place], [place] [address] Phone: [phone] Fax: [phone]    Your procedure is scheduled on Mon., Aug. 5, 2019 from 7:30AM-9:55AM  Report to [facility] Admitting Entrance "A" at 5:30AM  Call this number if you have problems the morning of surgery:  [phone]   Remember:  Do not eat or drink after midnight on Aug. 4th    Take these medicines the morning of surgery with A SIP OF WATER: Metoprolol tartrate (LOPRESSOR) If needed Gabapentin (NEURONTIN), Prochlorperazine (COMPAZINE), and  NitroGLYCERIN (NITROSTAT) (Notify the nurse if you had to take this medicine).  Follow your surgeon's instructions on when to stop Aspirin.  If no instructions were given by your surgeon then you will need to call the office to get those instructions.   As of today, stop taking all Other Aspirin Products, Vitamins, Fish oils, and Herbal medications. Also stop all NSAIDS i.e. Advil, Ibuprofen, Motrin, Aleve, Anaprox, Naproxen, BC, Goody Powders, and all Supplements. Including: Diclofenac (VOLTAREN)    Do not wear jewelry.  Do not wear lotions, powders, colognes, or deodorant.  Do not shave 48 hours prior to surgery.  Men may shave face.  Do not bring valuables to the hospital.  [facility] is not responsible for any belongings or valuables.  Contacts, dentures or bridgework may not be worn into surgery.  Leave your suitcase in the car.  After surgery it may be brought to your room.  For patients admitted to the hospital, discharge time will be determined by your treatment team.  Patients discharged the day of surgery will not be allowed to drive home.   Special instructions:  [place]- Preparing For Surgery  Before surgery,  you can play an important role. Because skin is not sterile, your skin needs to be as free of germs as possible. You can reduce the number of germs on your skin by washing with CHG (chlorahexidine gluconate) Soap before surgery.  CHG is an antiseptic cleaner which kills germs and bonds with the skin to continue killing germs even after washing.    Oral Hygiene is also important to reduce your risk of infection.  Remember - BRUSH YOUR TEETH THE MORNING OF SURGERY WITH YOUR REGULAR TOOTHPASTE  Please do not use if you have an allergy to CHG or antibacterial soaps. If your skin becomes reddened/irritated stop using the CHG.  Do not shave (including legs and underarms) for at least 48 hours prior to first CHG shower. It is OK to shave your face.  Please follow these instructions carefully.   1. Shower the NIGHT BEFORE SURGERY and the MORNING OF SURGERY with CHG.   2. If you chose to wash your hair, wash your hair first as usual with your normal shampoo.  3. After you shampoo, rinse your hair and body thoroughly to remove the shampoo.  4. Use CHG as you would any other liquid soap. You can apply CHG directly to the skin and wash gently with a scrungie or a clean washcloth.   5. Apply the CHG Soap to your body ONLY FROM THE NECK DOWN.  Do not use on open wounds or open sores. Avoid contact with your eyes, ears, mouth and genitals (private parts). Wash Face and  genitals (private parts)  with your normal soap.  6. Wash thoroughly, paying special attention to the area where your surgery will be performed.  7. Thoroughly rinse your body with warm water from the neck down.  8. DO NOT shower/wash with your normal soap after using and rinsing off the CHG Soap.  9. Pat yourself dry with a CLEAN TOWEL.  10. Wear CLEAN PAJAMAS to bed the night before surgery, wear comfortable clothes the morning of surgery  11. Place CLEAN SHEETS on your bed the night of your first shower and DO NOT SLEEP WITH  PETS.  Day of Surgery:  Do not apply any deodorants/lotions.  Please wear clean clothes to the hospital/surgery center.   Remember to brush your teeth WITH YOUR REGULAR TOOTHPASTE.  Please read over the following fact sheets that you were given. Pain Booklet, Coughing and Deep Breathing, MRSA Information and Surgical Site Infection Prevention

## 2017-12-19 ENCOUNTER — Telehealth: Payer: Self-pay | Admitting: *Deleted

## 2017-12-19 ENCOUNTER — Encounter (HOSPITAL_COMMUNITY): Payer: Self-pay

## 2017-12-19 ENCOUNTER — Other Ambulatory Visit: Payer: Self-pay | Admitting: *Deleted

## 2017-12-19 ENCOUNTER — Encounter (HOSPITAL_COMMUNITY)
Admission: RE | Admit: 2017-12-19 | Discharge: 2017-12-19 | Disposition: A | Discharge status: Home / Self Care | Payer: PPO | Source: Ambulatory Visit | Attending: Vascular Surgery | Admitting: Vascular Surgery

## 2017-12-19 ENCOUNTER — Other Ambulatory Visit: Payer: Self-pay

## 2017-12-19 DIAGNOSIS — Z01812 Encounter for preprocedural laboratory examination: Secondary | ICD-10-CM | POA: Insufficient documentation

## 2017-12-19 DIAGNOSIS — N3001 Acute cystitis with hematuria: Secondary | ICD-10-CM

## 2017-12-19 HISTORY — DX: Gastro-esophageal reflux disease without esophagitis: K21.9

## 2017-12-19 HISTORY — DX: Unspecified osteoarthritis, unspecified site: M19.90

## 2017-12-19 HISTORY — DX: Personal history of urinary calculi: Z87.442

## 2017-12-19 HISTORY — DX: Occlusion and stenosis of right carotid artery: I65.21

## 2017-12-19 LAB — COMPREHENSIVE METABOLIC PANEL
ALBUMIN: 3.8 g/dL (ref 3.5–5.0)
ALT: 32 U/L (ref 0–44)
AST: 20 U/L (ref 15–41)
Alkaline Phosphatase: 60 U/L (ref 38–126)
Anion gap: 8 (ref 5–15)
BUN: 21 mg/dL (ref 8–23)
CO2: 24 mmol/L (ref 22–32)
Calcium: 9.5 mg/dL (ref 8.9–10.3)
Chloride: 106 mmol/L (ref 98–111)
Creatinine, Ser: 1.31 mg/dL — ABNORMAL HIGH (ref 0.61–1.24)
GFR calc Af Amer: >60 mL/min (ref >60)
GFR calc non Af Amer: 53 mL/min — ABNORMAL LOW (ref >60)
GLUCOSE: 98 mg/dL (ref 70–99)
POTASSIUM: 4.3 mmol/L (ref 3.5–5.1)
SODIUM: 138 mmol/L (ref 135–145)
Total Bilirubin: 0.9 mg/dL (ref 0.3–1.2)
Total Protein: 6.8 g/dL (ref 6.5–8.1)

## 2017-12-19 LAB — URINALYSIS, ROUTINE W REFLEX MICROSCOPIC
BILIRUBIN URINE: NEGATIVE
Glucose, UA: NEGATIVE mg/dL
KETONES UR: NEGATIVE mg/dL
Nitrite: POSITIVE — AB
PH: 6 (ref 5.0–8.0)
PROTEIN: 30 mg/dL — AB
RBC / HPF: >50 RBC/hpf — ABNORMAL HIGH (ref 0–5)
SPECIFIC GRAVITY, URINE: 1.017 (ref 1.005–1.030)
WBC, UA: >50 WBC/hpf — ABNORMAL HIGH (ref 0–5)

## 2017-12-19 LAB — CBC
HCT: 47.8 % (ref 39.0–52.0)
HEMOGLOBIN: 15.4 g/dL (ref 13.0–17.0)
MCH: 30.3 pg (ref 26.0–34.0)
MCHC: 32.2 g/dL (ref 30.0–36.0)
MCV: 94.1 fL (ref 78.0–100.0)
Platelets: 201 10*3/uL (ref 150–400)
RBC: 5.08 MIL/uL (ref 4.22–5.81)
RDW: 13.2 % (ref 11.5–15.5)
WBC: 7.7 10*3/uL (ref 4.0–10.5)

## 2017-12-19 LAB — SURGICAL PCR SCREEN
MRSA, PCR: NEGATIVE
STAPHYLOCOCCUS AUREUS: NEGATIVE

## 2017-12-19 LAB — PROTIME-INR
INR: 1.02
Prothrombin Time: 13.3 seconds (ref 11.4–15.2)

## 2017-12-19 LAB — TYPE AND SCREEN
ABO/RH(D): A NEG
ANTIBODY SCREEN: NEGATIVE

## 2017-12-19 LAB — ABO/RH: ABO/RH(D): A NEG

## 2017-12-19 LAB — APTT: APTT: 30 s (ref 24–36)

## 2017-12-19 MED ORDER — CIPROFLOXACIN HCL 500 MG PO TABS: 500.0000 mg | ORAL_TABLET | Freq: Two times a day (BID) | ORAL | 0 refills | Status: DC

## 2017-12-19 NOTE — Progress Notes (Signed)
PCP - Dr. Allyn Kenner  Cardiologist - Denies- Pt sts he is in the process of obtaining   Chest x-ray - Denies  EKG - 10/26/17 (E)  Stress Test - 12/07/17 (E)  ECHO - 11/28/17 (E)  Cardiac Cath - 2000- Stent x1  Sleep Study - Denies CPAP - None  LABS- 12/19/17: CBC, CMP, PT, PTT, T/S, UA, PCR  ASA- Continue   Anesthesia- Yes- Previous note 10/27/17  Pt denies having chest pain, sob, or fever at this time. All instructions explained to the pt, with a verbal understanding of the material. Pt agrees to go over the instructions while at home for a better understanding. The opportunity to ask questions was provided.

## 2017-12-19 NOTE — Progress Notes (Signed)
Zigmund Daniel, RN from Dr. Luther Parody office made aware of the abnormal UA result. Will relay to anesthesia services, as well.

## 2017-12-19 NOTE — Telephone Encounter (Signed)
Received call from [place] in PAT regarding patient's UA done today. He has UTI and per our standing orders, I called in Cipro 500mg  bid x7 days to [facility] in [place]. I spoke to Mr. Difrancesco and instructed him on taking this medication. He voiced understanding and agreement with plan.

## 2017-12-20 NOTE — Progress Notes (Signed)
Anesthesia Chart Review:  Case:  [id number] Date/Time:  12/25/17 0715   Procedure:  ENDARTERECTOMY CAROTID RIGHT (Right )   Anesthesia type:  General   Pre-op diagnosis:  RIGHT CAROTID STENOSIS   Location:  [facility] OR ROOM 29 / [place] OR   Surgeon:  Rosetta Posner, MD      DISCUSSION: Patient is a 72 year old male scheduled for the above procedure. Surgery was initially scheduled for 11/06/17, but postponed to allow for cardiology evaluation. He has since seen Dr. Geraldo Pitter and had an unmarkable stress test and echo.   History includes CAD/MI '92 (s/p stentx3 '00), HTN, hypercholesterolemia, former smoker (quit '92). Review of lab trends are consistent with at least mild renal insufficiency (BUN 23, Cr 1.24 at PCP office 09/29/17; Cr range 1.24-1.58 since 09/2017. Preoperative Creatinine stable at 1.31. He was started on Cipro by [facility] for abnormal UA.  Based on currently available information, I anticipate that he can proceed as planned if no acute changes.   VS: BP (!) 148/65   Pulse (!) 51   Temp 36.5 C   Resp 20   Ht 5\' 7"  (1.702 m)   Wt 212 lb 14.4 oz (96.6 kg)   SpO2 95%   BMI 33.34 kg/m    PROVIDERS: Celene Squibb, MD is PCP Jyl Heinz, MD is cardiologist. Last visit 11/03/17.    LABS: Preoperative labs noted. CBC, PT/PTT WNL.   Creatinine stable at 1.31. UA shows large leukocytes and positive nitrites suggestive of UTI. [facility] RN Zigmund Daniel already notified by PAT RN regarding UA results, and patient was started on Cipro.  (all labs ordered are listed, but only abnormal results are displayed)  Labs Reviewed  COMPREHENSIVE METABOLIC PANEL - Abnormal; Notable for the following components:      Result Value   Creatinine, Ser 1.31 (*)    GFR calc non Af Amer 53 (*)    All other components within normal limits  URINALYSIS, ROUTINE W REFLEX MICROSCOPIC - Abnormal; Notable for the following components:   APPearance CLOUDY (*)    Hgb urine dipstick LARGE (*)    Protein, ur 30 (*)    Nitrite  POSITIVE (*)    Leukocytes, UA LARGE (*)    RBC / HPF >50 (*)    WBC, UA >50 (*)    Bacteria, UA MANY (*)    All other components within normal limits  SURGICAL PCR SCREEN  APTT  CBC  PROTIME-INR  TYPE AND SCREEN  ABO/RH   Lab Results  Component Value Date   CREATININE 1.31 (H) 12/19/2017   CREATININE 1.58 (H) 10/26/2017   CREATININE 1.22 09/14/2014    IMAGES: MRI/MRA head 10/26/17: IMPRESSION: 1. No acute intracranial abnormality. 2. Multifocal moderate-to-severe intracranial stenoses involving the anterior and posterior cerebral arteries. 3. Nonvisualization of the left vertebral artery, indicating either occlusion or early termination at the left PICA, below the imaged field of view. 4. 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 5. 2 mm inferiorly projecting A-comm aneurysm is suspected but somewhat poorly characterized on this study.   EKG: 10/26/17 EKG: SB at 48 bpm, first degree AV block. Cannot rule out anterior infarct (age undetermined). T wave abnormality, consider lateral ischemia.   CV: Nuclear stress test 12/07/17:  Nuclear stress EF: 55%.  There was no ST segment deviation noted during stress.  The study is normal.  This is a low risk study.  The left ventricular ejection fraction is normal (55-65%). Normal pharmacologic nuclear stress test with no  evidence for prior infarct or ischemia.   Echo 11/28/17: Impressions: - 1. Left ventricular systolic function is preserved visually   estimated ejection fraction is 60 to 65%. Impaired left   ventricular relaxation. Mild concentric left ventricular   hypertrophy.   2. Mild mitral regurgitation.  Right Carotid U/S 10/09/17: Final Interpretation: Right Carotid: Velocities in the right ICA are consistent with a 80-99%        stenosis. Vertebrals: Right vertebral artery demonstrates antegrade flow.  Carotid U/S 10/05/17 IMPRESSION: Right: Heterogeneous and partially calcified plaque at the right  carotid bifurcation contributes to 70%-99% stenosis by established duplex criteria. Left: Heterogeneous and partially calcified plaque at the left carotid bifurcation, with no significant stenosis by established duplex criteria.   Past Medical History:  Diagnosis Date  . Arthritis   . Carotid stenosis, asymptomatic, right   . Carpal tunnel syndrome    bilateral  . Coronary artery disease   . GERD (gastroesophageal reflux disease)   . History of kidney stones   . Hx of colonic polyp   . Hypercholesterolemia   . Hypertension   . Myocardial infarction ([place])    hx of  . Obesity     Past Surgical History:  Procedure Laterality Date  . CARDIAC CATHETERIZATION    . CORONARY STENT PLACEMENT  2000   x3     MEDICATIONS: . aspirin EC 81 MG tablet  . ciprofloxacin (CIPRO) 500 MG tablet  . diclofenac (VOLTAREN) 75 MG EC tablet  . enalapril (VASOTEC) 10 MG tablet  . gabapentin (NEURONTIN) 300 MG capsule  . metoprolol tartrate (LOPRESSOR) 50 MG tablet  . nitroGLYCERIN (NITROSTAT) 0.4 MG SL tablet  . prochlorperazine (COMPAZINE) 10 MG tablet   No current facility-administered medications for this encounter.     George Hugh Murphy [facility] Short Stay Center/Anesthesiology Phone [phone] 12/20/2017 1:41 PM

## 2017-12-22 DIAGNOSIS — Z6831 Body mass index (BMI) 31.0-31.9, adult: Secondary | ICD-10-CM | POA: Diagnosis not present

## 2017-12-22 DIAGNOSIS — M25511 Pain in right shoulder: Secondary | ICD-10-CM | POA: Diagnosis not present

## 2017-12-24 NOTE — Anesthesia Preprocedure Evaluation (Addendum)
Anesthesia Evaluation  Patient identified by MRN, date of birth, ID band Patient awake    Reviewed: Allergy & Precautions, H&P , NPO status , Patient's Chart, lab work & pertinent test results, reviewed documented beta blocker date and time   Airway Mallampati: III  TM Distance: >3 FB Neck ROM: full  Mouth opening: Limited Mouth Opening  Dental no notable dental hx. (+) Teeth Intact   Pulmonary neg pulmonary ROS, former smoker,    Pulmonary exam normal breath sounds clear to auscultation       Cardiovascular Exercise Tolerance: Good hypertension, Pt. on medications and Pt. on home beta blockers + CAD, + Past MI and + Peripheral Vascular Disease  negative cardio ROS   Rhythm:regular Rate:Normal  EKG: 10/26/17 EKG: SB at 48 bpm, first degree AV block. Cannot rule out anterior infarct (age undetermined). T wave abnormality, consider lateral ischemia.  CV: Nuclear stress test 12/07/17:  Nuclear stress EF: 55%.  There was no ST segment deviation noted during stress.  The study is normal.  This is a low risk study.  The left ventricular ejection fraction is normal (55-65%). Normal pharmacologic nuclear stress test with no evidence for prior infarct or ischemia.   Echo 11/28/17: Impressions: - 1. Left ventricular systolic function is preserved visually estimated ejection fraction is 60 to 65%. Impaired left ventricular relaxation. Mild concentric left ventricular hypertrophy. 2. Mild mitral regurgitation.    Neuro/Psych   IMAGES: MRI/MRA head 10/26/17: IMPRESSION: 1. No acute intracranial abnormality. 2. Multifocal moderate-to-severe intracranial stenoses involving the anterior and posterior cerebral arteries. 3. Nonvisualization of the left vertebral artery, indicating either occlusion or early termination at the left PICA, below the imaged field of view. 4. 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 5. 2 mm  inferiorly projecting A-comm aneurysm is suspected but somewhat poorly characterized on this study.  Right Carotid U/S 10/09/17: Final Interpretation: Right Carotid: Velocities in the right ICA are consistent with a 80-99% stenosis. Vertebrals: Right vertebral artery demonstrates antegrade flow.  Carotid U/S 10/05/17 IMPRESSION: Right: Heterogeneous and partially calcified plaque at the right carotid bifurcation contributes to 70%-99% stenosis by established duplex criteria. Left: Heterogeneous and partially calcified plaque at the left carotid bifurcation, with no significant stenosis by established duplex criteria.    Neuromuscular disease negative psych ROS   GI/Hepatic negative GI ROS, Neg liver ROS, GERD  ,  Endo/Other  negative endocrine ROS  Renal/GU negative Renal ROS  negative genitourinary   Musculoskeletal  (+) Arthritis , Osteoarthritis,    Abdominal   Peds  Hematology negative hematology ROS (+)   Anesthesia Other Findings   Reproductive/Obstetrics negative OB ROS                           Anesthesia Physical Anesthesia Plan  ASA: III  Anesthesia Plan: General   Post-op Pain Management:    Induction: Intravenous  PONV Risk Score and Plan: 2 and Ondansetron and Treatment may vary due to age or medical condition  Airway Management Planned: Oral ETT  Additional Equipment: Arterial line  Intra-op Plan:   Post-operative Plan: Extubation in OR  Informed Consent: I have reviewed the patients History and Physical, chart, labs and discussed the procedure including the risks, benefits and alternatives for the proposed anesthesia with the patient or authorized representative who has indicated his/her understanding and acceptance.   Dental Advisory Given  Plan Discussed with: CRNA  Anesthesia Plan Comments: (  )      Anesthesia  Quick Evaluation

## 2017-12-25 ENCOUNTER — Other Ambulatory Visit: Payer: Self-pay

## 2017-12-25 ENCOUNTER — Inpatient Hospital Stay (HOSPITAL_COMMUNITY): Payer: PPO | Admitting: Anesthesiology

## 2017-12-25 ENCOUNTER — Inpatient Hospital Stay (HOSPITAL_COMMUNITY)
Admission: RE | Admit: 2017-12-25 | Discharge: 2017-12-26 | DRG: 039 | Disposition: A | Discharge status: Home / Self Care | Payer: PPO | Attending: Vascular Surgery | Admitting: Vascular Surgery

## 2017-12-25 ENCOUNTER — Encounter (HOSPITAL_COMMUNITY): Payer: Self-pay

## 2017-12-25 ENCOUNTER — Inpatient Hospital Stay (HOSPITAL_COMMUNITY): Payer: PPO | Admitting: Physician Assistant

## 2017-12-25 ENCOUNTER — Encounter (HOSPITAL_COMMUNITY)
Admission: RE | Disposition: A | Discharge status: Home / Self Care | Payer: Self-pay | Source: Ambulatory Visit | Attending: Vascular Surgery

## 2017-12-25 DIAGNOSIS — E7800 Pure hypercholesterolemia, unspecified: Secondary | ICD-10-CM | POA: Diagnosis not present

## 2017-12-25 DIAGNOSIS — Z87891 Personal history of nicotine dependence: Secondary | ICD-10-CM | POA: Diagnosis not present

## 2017-12-25 DIAGNOSIS — E875 Hyperkalemia: Secondary | ICD-10-CM | POA: Diagnosis not present

## 2017-12-25 DIAGNOSIS — E669 Obesity, unspecified: Secondary | ICD-10-CM | POA: Diagnosis not present

## 2017-12-25 DIAGNOSIS — I6521 Occlusion and stenosis of right carotid artery: Principal | ICD-10-CM | POA: Diagnosis present

## 2017-12-25 DIAGNOSIS — I2510 Atherosclerotic heart disease of native coronary artery without angina pectoris: Secondary | ICD-10-CM | POA: Diagnosis not present

## 2017-12-25 DIAGNOSIS — I252 Old myocardial infarction: Secondary | ICD-10-CM | POA: Diagnosis not present

## 2017-12-25 DIAGNOSIS — Z7982 Long term (current) use of aspirin: Secondary | ICD-10-CM | POA: Diagnosis not present

## 2017-12-25 DIAGNOSIS — Z6833 Body mass index (BMI) 33.0-33.9, adult: Secondary | ICD-10-CM | POA: Diagnosis not present

## 2017-12-25 DIAGNOSIS — Z955 Presence of coronary angioplasty implant and graft: Secondary | ICD-10-CM | POA: Diagnosis not present

## 2017-12-25 DIAGNOSIS — I10 Essential (primary) hypertension: Secondary | ICD-10-CM | POA: Diagnosis present

## 2017-12-25 HISTORY — PX: PATCH ANGIOPLASTY: SHX6230

## 2017-12-25 HISTORY — PX: ENDARTERECTOMY: SHX5162

## 2017-12-25 SURGERY — ENDARTERECTOMY, CAROTID
Anesthesia: General | Site: Neck | Laterality: Right

## 2017-12-25 MED ORDER — CEFAZOLIN SODIUM-DEXTROSE 2-4 GM/100ML-% IV SOLN
2.0000 g | Freq: Three times a day (TID) | INTRAVENOUS | Status: AC
  Administered 2017-12-25 (×2): 2 g via INTRAVENOUS
  Filled 2017-12-25 (×2): qty 100

## 2017-12-25 MED ORDER — PROPOFOL 10 MG/ML IV BOLUS
INTRAVENOUS | Status: DC | PRN
  Administered 2017-12-25: 120 mg via INTRAVENOUS

## 2017-12-25 MED ORDER — NITROGLYCERIN 0.2 MG/ML ON CALL CATH LAB
INTRAVENOUS | Status: DC | PRN
  Administered 2017-12-25 (×4): 40 ug via INTRAVENOUS

## 2017-12-25 MED ORDER — SODIUM CHLORIDE 0.9 % IV SOLN
INTRAVENOUS | Status: DC | PRN
  Administered 2017-12-25: 09:00:00

## 2017-12-25 MED ORDER — FENTANYL CITRATE (PF) 100 MCG/2ML IJ SOLN: 25.0000 ug | INTRAMUSCULAR | Status: DC | PRN

## 2017-12-25 MED ORDER — PROTAMINE SULFATE 10 MG/ML IV SOLN
INTRAVENOUS | Status: DC | PRN
  Administered 2017-12-25 (×5): 10 mg via INTRAVENOUS

## 2017-12-25 MED ORDER — DEXMEDETOMIDINE HCL IN NACL 200 MCG/50ML IV SOLN
INTRAVENOUS | Status: AC
  Filled 2017-12-25: qty 50

## 2017-12-25 MED ORDER — MEPERIDINE HCL 50 MG/ML IJ SOLN: 6.2500 mg | INTRAMUSCULAR | Status: DC | PRN

## 2017-12-25 MED ORDER — LIDOCAINE HCL (PF) 1 % IJ SOLN
INTRAMUSCULAR | Status: AC
  Filled 2017-12-25: qty 30

## 2017-12-25 MED ORDER — DEXAMETHASONE SODIUM PHOSPHATE 10 MG/ML IJ SOLN
INTRAMUSCULAR | Status: DC | PRN
  Administered 2017-12-25: 10 mg via INTRAVENOUS

## 2017-12-25 MED ORDER — METOPROLOL TARTRATE 5 MG/5ML IV SOLN: 2.0000 mg | INTRAVENOUS | Status: DC | PRN

## 2017-12-25 MED ORDER — PROTAMINE SULFATE 10 MG/ML IV SOLN
INTRAVENOUS | Status: AC
  Filled 2017-12-25: qty 5

## 2017-12-25 MED ORDER — MIDAZOLAM HCL 2 MG/2ML IJ SOLN
INTRAMUSCULAR | Status: AC
  Filled 2017-12-25: qty 2

## 2017-12-25 MED ORDER — PROPOFOL 10 MG/ML IV BOLUS
INTRAVENOUS | Status: AC
  Filled 2017-12-25: qty 20

## 2017-12-25 MED ORDER — SODIUM CHLORIDE 0.9 % IV SOLN
INTRAVENOUS | Status: AC
  Filled 2017-12-25: qty 1.2

## 2017-12-25 MED ORDER — ONDANSETRON HCL 4 MG/2ML IJ SOLN: 4.0000 mg | Freq: Four times a day (QID) | INTRAMUSCULAR | Status: DC | PRN

## 2017-12-25 MED ORDER — POTASSIUM CHLORIDE CRYS ER 20 MEQ PO TBCR: 20.0000 meq | EXTENDED_RELEASE_TABLET | Freq: Every day | ORAL | Status: DC | PRN

## 2017-12-25 MED ORDER — SODIUM CHLORIDE 0.9 % IV SOLN
0.0125 ug/kg/min | INTRAVENOUS | Status: AC
  Administered 2017-12-25: .05 ug/kg/min via INTRAVENOUS
  Filled 2017-12-25: qty 2000

## 2017-12-25 MED ORDER — FENTANYL CITRATE (PF) 250 MCG/5ML IJ SOLN
INTRAMUSCULAR | Status: AC
  Filled 2017-12-25: qty 5

## 2017-12-25 MED ORDER — ROCURONIUM BROMIDE 10 MG/ML (PF) SYRINGE
PREFILLED_SYRINGE | INTRAVENOUS | Status: DC | PRN
  Administered 2017-12-25: 50 mg via INTRAVENOUS
  Administered 2017-12-25: 10 mg via INTRAVENOUS

## 2017-12-25 MED ORDER — POLYETHYLENE GLYCOL 3350 17 G PO PACK: 17.0000 g | PACK | Freq: Every day | ORAL | Status: DC | PRN

## 2017-12-25 MED ORDER — GABAPENTIN 300 MG PO CAPS: 300.0000 mg | ORAL_CAPSULE | Freq: Every day | ORAL | Status: DC | PRN

## 2017-12-25 MED ORDER — METOPROLOL TARTRATE 50 MG PO TABS
50.0000 mg | ORAL_TABLET | Freq: Two times a day (BID) | ORAL | Status: DC
  Administered 2017-12-25 – 2017-12-26 (×2): 50 mg via ORAL
  Filled 2017-12-25 (×2): qty 1

## 2017-12-25 MED ORDER — HEPARIN SODIUM (PORCINE) 1000 UNIT/ML IJ SOLN
INTRAMUSCULAR | Status: AC
  Filled 2017-12-25: qty 1

## 2017-12-25 MED ORDER — CHLORHEXIDINE GLUCONATE CLOTH 2 % EX PADS: 6.0000 | MEDICATED_PAD | Freq: Once | CUTANEOUS | Status: DC

## 2017-12-25 MED ORDER — LABETALOL HCL 5 MG/ML IV SOLN
INTRAVENOUS | Status: DC | PRN
  Administered 2017-12-25 (×2): 10 mg via INTRAVENOUS

## 2017-12-25 MED ORDER — PHENYLEPHRINE 40 MCG/ML (10ML) SYRINGE FOR IV PUSH (FOR BLOOD PRESSURE SUPPORT)
PREFILLED_SYRINGE | INTRAVENOUS | Status: AC
  Filled 2017-12-25: qty 10

## 2017-12-25 MED ORDER — ALUM & MAG HYDROXIDE-SIMETH 200-200-20 MG/5ML PO SUSP: 15.0000 mL | ORAL | Status: DC | PRN

## 2017-12-25 MED ORDER — BISACODYL 10 MG RE SUPP: 10.0000 mg | Freq: Every day | RECTAL | Status: DC | PRN

## 2017-12-25 MED ORDER — ACETAMINOPHEN 325 MG PO TABS: 325.0000 mg | ORAL_TABLET | ORAL | Status: DC | PRN

## 2017-12-25 MED ORDER — MIDAZOLAM HCL 5 MG/5ML IJ SOLN
INTRAMUSCULAR | Status: DC | PRN
  Administered 2017-12-25: 1 mg via INTRAVENOUS

## 2017-12-25 MED ORDER — MORPHINE SULFATE (PF) 2 MG/ML IV SOLN: 2.0000 mg | INTRAVENOUS | Status: DC | PRN

## 2017-12-25 MED ORDER — PHENOL 1.4 % MT LIQD: 1.0000 | OROMUCOSAL | Status: DC | PRN

## 2017-12-25 MED ORDER — SODIUM CHLORIDE 0.9 % IV SOLN
INTRAVENOUS | Status: DC
  Administered 2017-12-25: 10:00:00 via INTRAVENOUS

## 2017-12-25 MED ORDER — TRAMADOL HCL 50 MG PO TABS
50.0000 mg | ORAL_TABLET | Freq: Four times a day (QID) | ORAL | Status: DC | PRN
Start: 2017-12-25 — End: 2017-12-26
  Administered 2017-12-25: 50 mg via ORAL
  Filled 2017-12-25: qty 1

## 2017-12-25 MED ORDER — LIDOCAINE 2% (20 MG/ML) 5 ML SYRINGE
INTRAMUSCULAR | Status: DC | PRN
  Administered 2017-12-25: 100 mg via INTRAVENOUS

## 2017-12-25 MED ORDER — NITROGLYCERIN 0.4 MG SL SUBL: 0.4000 mg | SUBLINGUAL_TABLET | SUBLINGUAL | Status: DC | PRN

## 2017-12-25 MED ORDER — DOCUSATE SODIUM 100 MG PO CAPS
100.0000 mg | ORAL_CAPSULE | Freq: Every day | ORAL | Status: DC
  Administered 2017-12-26: 100 mg via ORAL
  Filled 2017-12-25: qty 1

## 2017-12-25 MED ORDER — HYDRALAZINE HCL 20 MG/ML IJ SOLN: 5.0000 mg | INTRAMUSCULAR | Status: DC | PRN

## 2017-12-25 MED ORDER — SODIUM CHLORIDE 0.9 % IV SOLN: 500.0000 mL | Freq: Once | INTRAVENOUS | Status: DC | PRN

## 2017-12-25 MED ORDER — ONDANSETRON HCL 4 MG/2ML IJ SOLN
INTRAMUSCULAR | Status: AC
  Filled 2017-12-25: qty 2

## 2017-12-25 MED ORDER — PROCHLORPERAZINE MALEATE 10 MG PO TABS
10.0000 mg | ORAL_TABLET | Freq: Two times a day (BID) | ORAL | Status: DC | PRN
  Filled 2017-12-25: qty 1

## 2017-12-25 MED ORDER — SODIUM CHLORIDE 0.9 % IV SOLN
INTRAVENOUS | Status: DC | PRN
  Administered 2017-12-25: 20 ug/min via INTRAVENOUS

## 2017-12-25 MED ORDER — PNEUMOCOCCAL VAC POLYVALENT 25 MCG/0.5ML IJ INJ
0.5000 mL | INJECTION | INTRAMUSCULAR | Status: DC
  Filled 2017-12-25: qty 0.5

## 2017-12-25 MED ORDER — SODIUM CHLORIDE 0.9 % IV SOLN: INTRAVENOUS | Status: DC

## 2017-12-25 MED ORDER — GUAIFENESIN-DM 100-10 MG/5ML PO SYRP: 15.0000 mL | ORAL_SOLUTION | ORAL | Status: DC | PRN

## 2017-12-25 MED ORDER — CIPROFLOXACIN HCL 500 MG PO TABS
500.0000 mg | ORAL_TABLET | Freq: Two times a day (BID) | ORAL | Status: DC
  Administered 2017-12-25 – 2017-12-26 (×3): 500 mg via ORAL
  Filled 2017-12-25 (×3): qty 1

## 2017-12-25 MED ORDER — CEFAZOLIN SODIUM-DEXTROSE 2-4 GM/100ML-% IV SOLN
2.0000 g | INTRAVENOUS | Status: AC
  Administered 2017-12-25: 2 g via INTRAVENOUS
  Filled 2017-12-25: qty 100

## 2017-12-25 MED ORDER — PANTOPRAZOLE SODIUM 40 MG PO TBEC
40.0000 mg | DELAYED_RELEASE_TABLET | Freq: Every day | ORAL | Status: DC
  Administered 2017-12-26: 40 mg via ORAL
  Filled 2017-12-25: qty 1

## 2017-12-25 MED ORDER — 0.9 % SODIUM CHLORIDE (POUR BTL) OPTIME
TOPICAL | Status: DC | PRN
  Administered 2017-12-25 (×2): 1000 mL

## 2017-12-25 MED ORDER — LIDOCAINE 2% (20 MG/ML) 5 ML SYRINGE
INTRAMUSCULAR | Status: AC
  Filled 2017-12-25: qty 5

## 2017-12-25 MED ORDER — HEPARIN SODIUM (PORCINE) 1000 UNIT/ML IJ SOLN
INTRAMUSCULAR | Status: DC | PRN
  Administered 2017-12-25: 9000 [IU] via INTRAVENOUS

## 2017-12-25 MED ORDER — GLYCOPYRROLATE PF 0.2 MG/ML IJ SOSY
PREFILLED_SYRINGE | INTRAMUSCULAR | Status: DC | PRN
  Administered 2017-12-25: .2 mg via INTRAVENOUS

## 2017-12-25 MED ORDER — DEXAMETHASONE SODIUM PHOSPHATE 10 MG/ML IJ SOLN
INTRAMUSCULAR | Status: AC
  Filled 2017-12-25: qty 1

## 2017-12-25 MED ORDER — ENALAPRIL MALEATE 10 MG PO TABS
10.0000 mg | ORAL_TABLET | Freq: Every day | ORAL | Status: DC
  Administered 2017-12-25 – 2017-12-26 (×2): 10 mg via ORAL
  Filled 2017-12-25 (×2): qty 1

## 2017-12-25 MED ORDER — ASPIRIN EC 81 MG PO TBEC
81.0000 mg | DELAYED_RELEASE_TABLET | Freq: Every day | ORAL | Status: DC
  Administered 2017-12-26: 81 mg via ORAL
  Filled 2017-12-25: qty 1

## 2017-12-25 MED ORDER — ONDANSETRON HCL 4 MG/2ML IJ SOLN
INTRAMUSCULAR | Status: DC | PRN
  Administered 2017-12-25: 4 mg via INTRAVENOUS

## 2017-12-25 MED ORDER — LABETALOL HCL 5 MG/ML IV SOLN
INTRAVENOUS | Status: AC
  Filled 2017-12-25: qty 4

## 2017-12-25 MED ORDER — LACTATED RINGERS IV SOLN
INTRAVENOUS | Status: DC | PRN
  Administered 2017-12-25: 07:00:00 via INTRAVENOUS

## 2017-12-25 MED ORDER — EPHEDRINE SULFATE-NACL 50-0.9 MG/10ML-% IV SOSY
PREFILLED_SYRINGE | INTRAVENOUS | Status: DC | PRN
  Administered 2017-12-25 (×2): 10 mg via INTRAVENOUS
  Administered 2017-12-25: 5 mg via INTRAVENOUS
  Administered 2017-12-25 (×2): 10 mg via INTRAVENOUS

## 2017-12-25 MED ORDER — MAGNESIUM SULFATE 2 GM/50ML IV SOLN: 2.0000 g | Freq: Every day | INTRAVENOUS | Status: DC | PRN

## 2017-12-25 MED ORDER — ROCURONIUM BROMIDE 10 MG/ML (PF) SYRINGE
PREFILLED_SYRINGE | INTRAVENOUS | Status: AC
  Filled 2017-12-25: qty 10

## 2017-12-25 MED ORDER — SUGAMMADEX SODIUM 200 MG/2ML IV SOLN
INTRAVENOUS | Status: DC | PRN
  Administered 2017-12-25: 192.4 mg via INTRAVENOUS

## 2017-12-25 MED ORDER — LABETALOL HCL 5 MG/ML IV SOLN: 10.0000 mg | INTRAVENOUS | Status: DC | PRN

## 2017-12-25 MED ORDER — ACETAMINOPHEN 325 MG RE SUPP: 325.0000 mg | RECTAL | Status: DC | PRN

## 2017-12-25 SURGICAL SUPPLY — 45 items
ADH SKN CLS APL DERMABOND .7 (GAUZE/BANDAGES/DRESSINGS) ×2
CANISTER SUCT 3000ML PPV (MISCELLANEOUS) ×3 IMPLANT
CANNULA VESSEL 3MM 2 BLNT TIP (CANNULA) ×6 IMPLANT
CATH ROBINSON RED A/P 18FR (CATHETERS) ×3 IMPLANT
CLIP LIGATING EXTRA MED SLVR (CLIP) ×3 IMPLANT
CLIP LIGATING EXTRA SM BLUE (MISCELLANEOUS) ×3 IMPLANT
CRADLE DONUT ADULT HEAD (MISCELLANEOUS) ×3 IMPLANT
DECANTER SPIKE VIAL GLASS SM (MISCELLANEOUS) IMPLANT
DERMABOND ADVANCED (GAUZE/BANDAGES/DRESSINGS) ×1
DERMABOND ADVANCED .7 DNX12 (GAUZE/BANDAGES/DRESSINGS) ×2 IMPLANT
DRAIN HEMOVAC 1/8 X 5 (WOUND CARE) IMPLANT
ELECT REM PT RETURN 9FT ADLT (ELECTROSURGICAL) ×3
ELECTRODE REM PT RTRN 9FT ADLT (ELECTROSURGICAL) ×2 IMPLANT
EVACUATOR SILICONE 100CC (DRAIN) IMPLANT
GLOVE BIO SURGEON STRL SZ 6.5 (GLOVE) ×2 IMPLANT
GLOVE BIOGEL PI IND STRL 6.5 (GLOVE) IMPLANT
GLOVE BIOGEL PI INDICATOR 6.5 (GLOVE) ×1
GLOVE SS BIOGEL STRL SZ 7.5 (GLOVE) ×2 IMPLANT
GLOVE SUPERSENSE BIOGEL SZ 7.5 (GLOVE) ×1
GLOVE SURG SS PI 6.5 STRL IVOR (GLOVE) ×1 IMPLANT
GLOVE SURG SS PI 7.0 STRL IVOR (GLOVE) ×1 IMPLANT
GOWN STRL REUS W/ TWL LRG LVL3 (GOWN DISPOSABLE) ×6 IMPLANT
GOWN STRL REUS W/ TWL XL LVL3 (GOWN DISPOSABLE) IMPLANT
GOWN STRL REUS W/TWL LRG LVL3 (GOWN DISPOSABLE) ×9
GOWN STRL REUS W/TWL XL LVL3 (GOWN DISPOSABLE) ×3
KIT BASIN OR (CUSTOM PROCEDURE TRAY) ×3 IMPLANT
KIT SHUNT ARGYLE CAROTID ART 6 (VASCULAR PRODUCTS) IMPLANT
KIT TURNOVER KIT B (KITS) ×3 IMPLANT
NEEDLE 22X1 1/2 (OR ONLY) (NEEDLE) IMPLANT
NS IRRIG 1000ML POUR BTL (IV SOLUTION) ×6 IMPLANT
PACK CAROTID (CUSTOM PROCEDURE TRAY) ×3 IMPLANT
PAD ARMBOARD 7.5X6 YLW CONV (MISCELLANEOUS) ×6 IMPLANT
PATCH HEMASHIELD 8X75 (Vascular Products) ×1 IMPLANT
SHUNT CAROTID BYPASS 10 (VASCULAR PRODUCTS) ×1 IMPLANT
SHUNT CAROTID BYPASS 12FRX15.5 (VASCULAR PRODUCTS) IMPLANT
SUT ETHILON 3 0 PS 1 (SUTURE) IMPLANT
SUT PROLENE 6 0 CC (SUTURE) ×3 IMPLANT
SUT SILK 3 0 (SUTURE)
SUT SILK 3-0 18XBRD TIE 12 (SUTURE) IMPLANT
SUT VIC AB 3-0 SH 27 (SUTURE) ×6
SUT VIC AB 3-0 SH 27X BRD (SUTURE) ×4 IMPLANT
SUT VICRYL 4-0 PS2 18IN ABS (SUTURE) ×3 IMPLANT
SYR CONTROL 10ML LL (SYRINGE) IMPLANT
TOWEL GREEN STERILE (TOWEL DISPOSABLE) ×3 IMPLANT
WATER STERILE IRR 1000ML POUR (IV SOLUTION) ×3 IMPLANT

## 2017-12-25 NOTE — Progress Notes (Addendum)
  Day of Surgery Note    Subjective:  No complaints; says he doesn't feel like he had surgery; no difficulty swallowing.  Has walked in the halls and voided.    Vitals:   12/25/17 1032 12/25/17 1737  BP: (!) 116/49 136/63  Pulse:  89  Resp:  15  Temp: 97.6 F (36.4 C)   SpO2: 92% 94%    Incisions:   Clean and dry without hematoma Extremities:  Moving all extremities equally Cardiac:  regular Lungs:  Non labored Neuro:  In tact; tongue is midline   Assessment/Plan:  This is a 72 y.o. male who is s/p  Right CEA  -pt doing well this evening.  He is neuro intact.  He has ambulated and voided. He is not having any difficulty swallowing.  -discussed his pain medication and he is able to take tramadol so will order this.  He has not needed any pain medication at this point. -anticipate discharge in the am.   Leontine Locket, PA-C 12/25/2017 7:19 PM [phone]  I have examined the patient, reviewed and agree with above.  Looks great.  Up in chair.  Neurologically intact.  Minimal neck soreness  Curt Jews, MD 12/25/2017 8:00 PM

## 2017-12-25 NOTE — Transfer of Care (Signed)
Immediate Anesthesia Transfer of Care Note  Patient: Jeremy [name]  Procedure(s) Performed: ENDARTERECTOMY CAROTID RIGHT (Right ) PATCH ANGIOPLASTY RIGHT CAROTID ARTERY (Right Neck)  Patient Location: PACU  Anesthesia Type:General  Level of Consciousness: awake, alert , oriented and patient cooperative  Airway & Oxygen Therapy: Patient Spontanous Breathing and Patient connected to nasal cannula oxygen  Post-op Assessment: Report given to RN and Post -op Vital signs reviewed and stable  Post vital signs: Reviewed and stable  Last Vitals:  Vitals Value Taken Time  BP    Temp    Pulse 82 12/25/2017  9:37 AM  Resp 17 12/25/2017  9:37 AM  SpO2 95 % 12/25/2017  9:37 AM  Vitals shown include unvalidated device data.  Last Pain:  Vitals:   12/25/17 0555  TempSrc:   PainSc: 0-No pain      Patients Stated Pain Goal: 3 (03/54/65 6812)  Complications: No apparent anesthesia complications

## 2017-12-25 NOTE — Anesthesia Procedure Notes (Signed)
Arterial Line Insertion Start/End8/09/2017 7:05 AM, 12/25/2017 7:10 AM Performed by: CRNA  Patient location: Pre-op. Preanesthetic checklist: patient identified, IV checked, site marked, risks and benefits discussed, surgical consent, monitors and equipment checked, pre-op evaluation, timeout performed and anesthesia consent Lidocaine 1% used for infiltration Left, radial was placed Catheter size: 20 G Hand hygiene performed , maximum sterile barriers used  and Seldinger technique used Allen's test indicative of satisfactory collateral circulation Attempts: 1 Procedure performed without using ultrasound guided technique. Following insertion, dressing applied and Biopatch. Post procedure assessment: normal  Patient tolerated the procedure well with no immediate complications.

## 2017-12-25 NOTE — Anesthesia Procedure Notes (Signed)
Procedure Name: Intubation Date/Time: 12/25/2017 7:42 AM Performed by: Renato Shin, CRNA Pre-anesthesia Checklist: Patient identified, Emergency Drugs available, Suction available and Patient being monitored Patient Re-evaluated:Patient Re-evaluated prior to induction Oxygen Delivery Method: Circle system utilized Preoxygenation: Pre-oxygenation with 100% oxygen Induction Type: IV induction Ventilation: Mask ventilation without difficulty Laryngoscope Size: Miller and 2 Grade View: Grade II Tube type: Oral Tube size: 7.5 mm Number of attempts: 1 Airway Equipment and Method: Stylet Placement Confirmation: ETT inserted through vocal cords under direct vision,  positive ETCO2,  CO2 detector and breath sounds checked- equal and bilateral Secured at: 21 cm Tube secured with: Tape Dental Injury: Teeth and Oropharynx as per pre-operative assessment

## 2017-12-25 NOTE — Op Note (Signed)
    OPERATIVE REPORT  DATE OF SURGERY: 12/25/2017  PATIENT: Jeremy [name], 72 y.o. male MRN: [id number]  DOB: 1945-09-22  PRE-OPERATIVE DIAGNOSIS: Severe asymptomatic right carotid stenosis  POST-OPERATIVE DIAGNOSIS:  Same  PROCEDURE: Right carotid endarterectomy and Dacron patch angioplasty  SURGEON:  Curt Jews, M.D.  PHYSICIAN ASSISTANT: Liana Crocker, PA-C  ANESTHESIA: General  EBL: per anesthesia record  Total I/O In: 700 [I.V.:700] Out: 75 [Blood:75]  BLOOD ADMINISTERED: none  DRAINS: none  SPECIMEN: none  COUNTS CORRECT:  YES  PATIENT DISPOSITION:  PACU - hemodynamically stable  PROCEDURE DETAILS: Patient was taken to the operating placed supine position where the area the right next prepped draped in usual sterile fashion.  A physical exam he does seem to have a very low bifurcation of his carotid by palpation of the carotid bulb.  I imaged his carotid with SonoSite ultrasound he did have a very low bifurcation.  Incision was made anterior to the sternocleidomastoid and carried down through the platysma with electrocautery.  The sternocleidomastoid reflected posteriorly and the carotid sheath was opened.  The common carotid artery had minimal plaque and was encircled with a umbilical tape and Rummel tourniquet.  The hypoglossal and vagus nerves were identified and preserved.  Dissection extended onto the bifurcation.  The superior thyroid artery was encircled with a 2-0 silk Potts tie.  The external carotid was encircled with a blue vessel loop and the internal carotid with umbilical tape and Rummel tourniquet.  The patient was given 9000 units of intravenous heparin.  After adequate circulation time the internal/external and common carotid arteries were occluded.  The common carotid artery was opened with an 11 blade and sent longitudinally with Potts scissors onto the internal carotid artery.  There was moderate backbleeding.  A 10 shunt was placed in the internal  carotid and there did appear to be some resistance possibly from tortuosity and therefore the endarterectomy was done without a shunt.  The endarterectomy skin on the common carotid artery and the plaque was divided proximally with Potts scissors.  The endarterectomy scanted onto the bifurcation and the external carotid was endarterectomized with an eversion technique and the internal carotid was endarterectomized in an open fashion.  Remaining atheromatous debris was removed from the endarterectomy plane.  A Finesse Hemashield Dacron patch was brought onto the field and was sewn as a patch angioplasty with a running 6-0 Prolene suture.  Prior to completion of the closure the usual flushing maneuvers were undertaken.  The anastomosis was completed and flow was restored first to the external and the internal carotid artery.  Excellent flow characteristics were noted with hand-held Doppler in the internal and external carotid arteries.  The patient was given 50 mg of protamine to reverse heparin.  Wounds irrigated with saline.  Hemostasis talus cautery.  Wounds were closed with 3-0 Vicryl to reapproximate sternocleidomastoid over the carotid sheath.  The platysma was closed with a running 3-0 Vicryl suture and the skin was closed with a 4 oh sub-particular Vicryl suture.  Dermabond was applied over the wound and the patient was awakened neurologically intact in the operating room was transferred to the recovery room hemodynamically stable   Rosetta Posner, M.D., [facility] 12/25/2017 9:41 AM

## 2017-12-25 NOTE — Anesthesia Postprocedure Evaluation (Signed)
Anesthesia Post Note  Patient: BAUDELIO KARNES  Procedure(s) Performed: ENDARTERECTOMY CAROTID RIGHT (Right ) PATCH ANGIOPLASTY RIGHT CAROTID ARTERY (Right Neck)     Patient location during evaluation: PACU Anesthesia Type: General Level of consciousness: awake and alert Pain management: pain level controlled Vital Signs Assessment: post-procedure vital signs reviewed and stable Respiratory status: spontaneous breathing, nonlabored ventilation, respiratory function stable and patient connected to nasal cannula oxygen Cardiovascular status: blood pressure returned to baseline and stable Postop Assessment: no apparent nausea or vomiting Anesthetic complications: no    Last Vitals:  Vitals:   12/25/17 0540 12/25/17 0938  BP: (!) 147/73 124/62  Pulse: (!) 51 82  Resp: 18 17  Temp: 37.1 C (!) 36.1 C  SpO2: 98% 95%    Last Pain:  Vitals:   12/25/17 0938  TempSrc:   PainSc: 0-No pain    LLE Motor Response: Purposeful movement;Responds to commands (12/25/17 5520) LLE Sensation: Full sensation (12/25/17 8022) RLE Motor Response: Purposeful movement;Responds to commands (12/25/17 3361) RLE Sensation: Full sensation (12/25/17 2244)      Faatima Tench
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
## 2017-12-26 ENCOUNTER — Encounter (HOSPITAL_COMMUNITY): Payer: Self-pay | Admitting: Vascular Surgery

## 2017-12-26 LAB — BASIC METABOLIC PANEL
ANION GAP: 9 (ref 5–15)
BUN: 28 mg/dL — ABNORMAL HIGH (ref 8–23)
CALCIUM: 8.5 mg/dL — AB (ref 8.9–10.3)
CO2: 22 mmol/L (ref 22–32)
Chloride: 106 mmol/L (ref 98–111)
Creatinine, Ser: 1.54 mg/dL — ABNORMAL HIGH (ref 0.61–1.24)
GFR calc Af Amer: 51 mL/min — ABNORMAL LOW (ref >60)
GFR calc non Af Amer: 44 mL/min — ABNORMAL LOW (ref >60)
Glucose, Bld: 123 mg/dL — ABNORMAL HIGH (ref 70–99)
POTASSIUM: 5.9 mmol/L — AB (ref 3.5–5.1)
Sodium: 137 mmol/L (ref 135–145)

## 2017-12-26 LAB — CBC
HEMATOCRIT: 39.1 % (ref 39.0–52.0)
Hemoglobin: 13 g/dL (ref 13.0–17.0)
MCH: 30.9 pg (ref 26.0–34.0)
MCHC: 33.2 g/dL (ref 30.0–36.0)
MCV: 92.9 fL (ref 78.0–100.0)
Platelets: 253 10*3/uL (ref 150–400)
RBC: 4.21 MIL/uL — ABNORMAL LOW (ref 4.22–5.81)
RDW: 13.3 % (ref 11.5–15.5)
WBC: 13.6 10*3/uL — ABNORMAL HIGH (ref 4.0–10.5)

## 2017-12-26 LAB — POTASSIUM: Potassium: 4.2 mmol/L (ref 3.5–5.1)

## 2017-12-26 MED ORDER — TRAMADOL HCL 50 MG PO TABS: 50.0000 mg | ORAL_TABLET | Freq: Four times a day (QID) | ORAL | 0 refills | Status: DC | PRN

## 2017-12-26 MED ORDER — SODIUM POLYSTYRENE SULFONATE 15 GM/60ML PO SUSP
30.0000 g | Freq: Once | ORAL | Status: AC
  Administered 2017-12-26: 30 g via ORAL
  Filled 2017-12-26: qty 120

## 2017-12-26 NOTE — Progress Notes (Signed)
[facility] Progress Note Patient Name: Jeremy [name] DOB: 25-Jul-1945 MRN: [id number]   Date of Service  12/26/2017  HPI/Events of Note  hyperkalemia  eICU Interventions  Kayexalate. Recheck K at noon. No EKG changes noted on monitor.     Intervention Category Minor Interventions: Electrolytes abnormality - evaluation and management  Sharia Reeve 12/26/2017, 5:53 AM

## 2017-12-26 NOTE — Progress Notes (Addendum)
  Progress Note    12/26/2017 7:14 AM 1 Day Post-Op  Subjective:  Sitting up-feels good.  No complaints.  Swallowing okay. Voiding okay.  He has walked.   Afebrile HR 50's-90's  197'J-883'G systolic 54% RA  Vitals:   12/26/17 0001 12/26/17 0448  BP:  (!) 121/51  Pulse:  63  Resp:  18  Temp: (!) 97.5 F (36.4 C)   SpO2:  96%     Physical Exam: Neuro:  In tact; tongue midline Lungs:  Non labored Incision:  Clean and dry  CBC    Component Value Date/Time   WBC 13.6 (H) 12/26/2017 0439   RBC 4.21 (L) 12/26/2017 0439   HGB 13.0 12/26/2017 0439   HCT 39.1 12/26/2017 0439   PLT 253 12/26/2017 0439   MCV 92.9 12/26/2017 0439   MCH 30.9 12/26/2017 0439   MCHC 33.2 12/26/2017 0439   RDW 13.3 12/26/2017 0439   LYMPHSABS 1.0 10/26/2017 1500   MONOABS 0.5 10/26/2017 1500   EOSABS 0.4 10/26/2017 1500   BASOSABS 0.1 10/26/2017 1500    BMET    Component Value Date/Time   NA 137 12/26/2017 0439   K 5.9 (H) 12/26/2017 0439   CL 106 12/26/2017 0439   CO2 22 12/26/2017 0439   GLUCOSE 123 (H) 12/26/2017 0439   BUN 28 (H) 12/26/2017 0439   CREATININE 1.54 (H) 12/26/2017 0439   CALCIUM 8.5 (L) 12/26/2017 0439   GFRNONAA 44 (L) 12/26/2017 0439   GFRAA 51 (L) 12/26/2017 0439     Intake/Output Summary (Last 24 hours) at 12/26/2017 0714 Last data filed at 12/25/2017 1700 Gross per 24 hour  Intake 1420 ml  Output 75 ml  Net 1345 ml     Assessment/Plan:  This is a 72 y.o. male who is s/p right CEA 1 Day Post-Op  -pt is doing well this am. -pt neuro exam is in tact -hyperkalemia this am with K+ 5.9.  Received Kayexalate.  Recheck K+ at noon.  Pt without EKG changes.  Creatinine 1.54 this am (was 1.58 in June).  Will hold ACEI for now now and have pt f/u with PCP next week to check labs and re-evaluate meds -pt has ambulated -pt has voided -f/u with Dr. Donnetta Hutching in 2 weeks.   Leontine Locket, PA-C [facility] [phone]  I have examined the patient,  reviewed and agree with above.  Curt Jews, MD 12/26/2017 8:50 AM

## 2017-12-26 NOTE — Progress Notes (Signed)
Discharge instructions reviewed with patient and wife and prescription given. All questions answered and pt agrees to F/U appts and instructions. Discharged via wheelchair with volunteer to family vehicle in stable condition.

## 2017-12-26 NOTE — Plan of Care (Signed)
  Problem: Education: Goal: Required Educational Video(s) Outcome: Adequate for Discharge   Problem: Clinical Measurements: Goal: Postoperative complications will be avoided or minimized Outcome: Adequate for Discharge   Problem: Skin Integrity: Goal: Demonstration of wound healing without infection will improve Outcome: Adequate for Discharge   

## 2017-12-26 NOTE — Discharge Instructions (Signed)
[facility]  Discharge Instructions   Carotid Endarterectomy (CEA)  Please refer to the following instructions for your post-procedure care. Your surgeon or physician assistant will discuss any changes with you.  Activity  You are encouraged to walk as much as you can. You can slowly return to normal activities but must avoid strenuous activity and heavy lifting until your doctor tell you it's okay. Avoid activities such as vacuuming or swinging a golf club. You can drive after one week if you are comfortable and you are no longer taking prescription pain medications. It is normal to feel tired for serval weeks after your surgery. It is also normal to have difficulty with sleep habits, eating, and bowel movements after surgery. These will go away with time.  Bathing/Showering  Shower daily after you go home. Do not soak in a bathtub, hot tub, or swim until the incision heals completely.  Incision Care  Shower every day. Clean your incision with mild soap and water. Pat the area dry with a clean towel. You do not need a bandage unless otherwise instructed. Do not apply any ointments or creams to your incision. You may have skin glue on your incision. Do not peel it off. It will come off on its own in about one week. Your incision may feel thickened and raised for several weeks after your surgery. This is normal and the skin will soften over time.   For Men Only: It's okay to shave around the incision but do not shave the incision itself for 2 weeks. It is common to have numbness under your chin that could last for several months.  Diet  Resume your normal diet. There are no special food restrictions following this procedure. A low fat/low cholesterol diet is recommended for all patients with vascular disease. In order to heal from your surgery, it is CRITICAL to get adequate nutrition. Your body requires vitamins, minerals, and protein. Vegetables are the  best source of vitamins and minerals. Vegetables also provide the perfect balance of protein. Processed food has little nutritional value, so try to avoid this.  Medications  Resume taking all of your medications unless your doctor or physician assistant tells you not to. If your incision is causing pain, you may take over-the- counter pain relievers such as acetaminophen (Tylenol). If you were prescribed a stronger pain medication, please be aware these medications can cause nausea and constipation. Prevent nausea by taking the medication with a snack or meal. Avoid constipation by drinking plenty of fluids and eating foods with a high amount of fiber, such as fruits, vegetables, and grains.  Do not take Tylenol if you are taking prescription pain medications.  Stop taking your Lisinopril until follow up with Dr. Nevada Crane in one week to have labs checked. (checking your kidney function and potassium).  Follow Up  Our office will schedule a follow up appointment 2-3 weeks following discharge.  Please call us immediately for any of the following conditions   Increased pain, redness, drainage (pus) from your incision site.  Fever of 101 degrees or higher.  If you should develop stroke (slurred speech, difficulty swallowing, weakness on one side of your body, loss of vision) you should call 911 and go to the nearest emergency room.   Reduce your risk of vascular disease:   Stop smoking. If you would like help call [facility] at 1-800-QUIT-NOW (317) 861-2733) or [place] at [phone].  Manage your cholesterol  Maintain a desired weight  Control your diabetes  Keep your blood pressure down   If you have any questions, please call the office at [phone].
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
## 2017-12-27 NOTE — Consult Note (Signed)
            [facility] CM Primary Care Navigator  12/27/2017  Jeremy [name] [birth year] [id number]   Wenttoseepatientat the bedsideto identify possible discharge needs buthe was alreadydischargedhome per staff report.   Perchart review,patientwas initially scheduled for carotid surgery on 11/06/17, but postponed to allow for cardiology evaluation. Has right carotid stenosis and underwent carotid endarterectomy this admission.  Patient hasdischarge instruction to follow-up with primary care provider in 1 week and vascular surgery follow-up in 2 weeks.   For additional questions please contact:  Edwena Felty A. Aniruddh Ciavarella, BSN, RN-BC [facility] PRIMARY CARE Navigator Cell: [phone]

## 2017-12-29 ENCOUNTER — Other Ambulatory Visit: Payer: Self-pay

## 2017-12-29 NOTE — Patient Outreach (Signed)
[facility]) Care Management  [place]   12/29/2017  Jeremy [name] December 08, 1945 [id number]   72 year old male outreached by [place] services for 30 day post discharge medication review.  PMHx includes, but not limited to, hypertension, h/o MI, coronary artery disease, carotid artery disease and mixed dyslipidemia.   Successful outreach attempt to Jeremy [name].  HIPAA identifiers verified.   Subjective: Jeremy [name] reports that he is doing well after his recent procedure.  He states that he has not had to take any pain medication.  He reports that he is not diabetic and that his cholesterol was "good" last time it was checked.   Objective:  Scr 1.54 mg/dL on 12/26/17  CrCl ~ 70ml/min Total Cholesterol 177 mg/dL, HDL 38 mg/dL and LDL 108 mg/dL in 7/18. HgA1c 5.3% in 7/18  Current Medications: Current Outpatient Medications  Medication Sig Dispense Refill  . aspirin EC 81 MG tablet Take 81 mg by mouth daily.    . diclofenac (VOLTAREN) 75 MG EC tablet Take 75 mg by mouth 2 (two) times daily.     Marland Kitchen gabapentin (NEURONTIN) 300 MG capsule Take 300 mg by mouth daily as needed for pain.  1  . metoprolol tartrate (LOPRESSOR) 50 MG tablet Take 50 mg by mouth 2 (two) times daily.    . nitroGLYCERIN (NITROSTAT) 0.4 MG SL tablet Place 0.4 mg under the tongue every 5 (five) minutes as needed for chest pain.     Marland Kitchen prochlorperazine (COMPAZINE) 10 MG tablet Take 1 tablet (10 mg total) by mouth 2 (two) times daily as needed (Headache). 10 tablet 0  . traMADol (ULTRAM) 50 MG tablet Take 1 tablet (50 mg total) by mouth every 6 (six) hours as needed for moderate pain. 8 tablet 0   No current facility-administered medications for this visit.     Functional Status: In your present state of health, do you have any difficulty performing the following activities: 12/25/2017 12/19/2017  Hearing? N N  Vision? N N  Difficulty concentrating or making decisions? N N  Walking or climbing stairs?  N N  Dressing or bathing? N N  Doing errands, shopping? N N  Some recent data might be hidden    Fall/Depression Screening: No flowsheet data found. No flowsheet data found.  ASSESSMENT: Date Discharged from Hospital: 12/26/17 Date Medication Reconciliation Performed: 12/29/2017  Medications Discontinued at Discharge:   Enalapril  New Medications at Discharge:   Tramadol  Ciprofloxacin (completed)  Patient was recently discharged from hospital and all medications have been reviewed  Drugs sorted by system:  Neurologic/Psychologic:  Cardiovascular: aspirin 81 mg, metoprolol tartrate, nitroglycerin  Gastrointestinal: prochlorperazine  Pain: diclofenac, gabapentin, tramadol  Gaps in therapy:   Patient with h/o MI and recent right carotid endarterectomy and not on a statin therapy.  Medications to avoid in the elderly:  Prochlorperazine - A phenothiazine that can cause extrapyramidal symptoms is not considered an antiemetic of choice in the elderly.   PLAN: Route note to PCP, Dr. Nevada Crane.  Joetta Manners, PharmD Clinical Pharmacist [place] [phone]

## 2018-01-09 DIAGNOSIS — H9312 Tinnitus, left ear: Secondary | ICD-10-CM | POA: Diagnosis not present

## 2018-01-09 DIAGNOSIS — Z8669 Personal history of other diseases of the nervous system and sense organs: Secondary | ICD-10-CM | POA: Diagnosis not present

## 2018-01-09 DIAGNOSIS — H6122 Impacted cerumen, left ear: Secondary | ICD-10-CM | POA: Diagnosis not present

## 2018-01-10 DIAGNOSIS — Z6832 Body mass index (BMI) 32.0-32.9, adult: Secondary | ICD-10-CM | POA: Diagnosis not present

## 2018-01-10 DIAGNOSIS — E875 Hyperkalemia: Secondary | ICD-10-CM | POA: Diagnosis not present

## 2018-01-10 DIAGNOSIS — R0989 Other specified symptoms and signs involving the circulatory and respiratory systems: Secondary | ICD-10-CM | POA: Diagnosis not present

## 2018-01-10 DIAGNOSIS — I10 Essential (primary) hypertension: Secondary | ICD-10-CM | POA: Diagnosis not present

## 2018-01-15 DIAGNOSIS — M25511 Pain in right shoulder: Secondary | ICD-10-CM | POA: Diagnosis not present

## 2018-01-15 DIAGNOSIS — M25512 Pain in left shoulder: Secondary | ICD-10-CM | POA: Diagnosis not present

## 2018-01-24 ENCOUNTER — Ambulatory Visit: Payer: PPO

## 2018-01-24 ENCOUNTER — Ambulatory Visit (INDEPENDENT_AMBULATORY_CARE_PROVIDER_SITE_OTHER): Payer: Self-pay | Admitting: Physician Assistant

## 2018-01-24 ENCOUNTER — Other Ambulatory Visit: Payer: Self-pay

## 2018-01-24 VITALS — BP 140/79 | HR 51 | Temp 97.3°F | Resp 16 | Ht 68.0 in | Wt 215.0 lb

## 2018-01-24 DIAGNOSIS — I6521 Occlusion and stenosis of right carotid artery: Secondary | ICD-10-CM

## 2018-01-24 NOTE — Progress Notes (Signed)
  POST OPERATIVE OFFICE NOTE    CC:  F/u for surgery  HPI:  HPI: This is a 72 y.o. male who presented to his PCP for a yearly exam and a right carotid bruit was heard.  He was sent for a carotid duplex and found to have high grade asymptomatic right carotid artery stenosis s/p right CEA 12/25/2017.  He is here today for a f/u visit.  He denise weakness, amaurosis, and aphasia.  He is essentially back to his pre surgery baseline.    Allergies  Allergen Reactions  . Codeine Anaphylaxis  . Oxycontin [Oxycodone Hcl] Shortness Of Breath and Itching    Current Outpatient Medications  Medication Sig Dispense Refill  . aspirin EC 81 MG tablet Take 81 mg by mouth daily.    . diclofenac (VOLTAREN) 75 MG EC tablet Take 75 mg by mouth 2 (two) times daily.     Marland Kitchen gabapentin (NEURONTIN) 300 MG capsule Take 300 mg by mouth daily as needed for pain.  1  . metoprolol tartrate (LOPRESSOR) 50 MG tablet Take 50 mg by mouth 2 (two) times daily.    . nitroGLYCERIN (NITROSTAT) 0.4 MG SL tablet Place 0.4 mg under the tongue every 5 (five) minutes as needed for chest pain.     Marland Kitchen prochlorperazine (COMPAZINE) 10 MG tablet Take 1 tablet (10 mg total) by mouth 2 (two) times daily as needed (Headache). 10 tablet 0  . traMADol (ULTRAM) 50 MG tablet Take 1 tablet (50 mg total) by mouth every 6 (six) hours as needed for moderate pain. 8 tablet 0   No current facility-administered medications for this visit.      ROS:  See HPI  Physical Exam:  Vitals:   01/24/18 1308 01/24/18 1311  BP: 138/74 140/79  Pulse: (!) 52 (!) 51  Resp: 16   Temp: (!) 97.3 F (36.3 C)   SpO2: 96%     Incision:  Well healed without evidence of hematoma or erythema. Extremities:  B UE palpable radial pulses, grip 5/5, no tongue deviation and smile is symmetrical.   Neuro: No neurologic deficits.  Abdomen:  Soft, + BS Heart RRR  Assessment/Plan:  This is a 72 y.o. male who is s/p: Asymptomatic Right CEA  The carotid stenosis was  found by his PCP due to right bruit.  He is at baseline post op without neurologic deficits.  At this time he can perform activities as tolerates.    F/U in 6 months with a repeat carotid duplex for surveillance.       Roxy Horseman , PA-C [facility] [phone]

## 2018-02-20 ENCOUNTER — Encounter

## 2018-02-20 ENCOUNTER — Encounter: Payer: PPO | Admitting: Vascular Surgery

## 2018-03-02 DIAGNOSIS — R51 Headache: Secondary | ICD-10-CM | POA: Diagnosis not present

## 2018-03-19 DIAGNOSIS — H04523 Eversion of bilateral lacrimal punctum: Secondary | ICD-10-CM | POA: Diagnosis not present

## 2018-03-19 DIAGNOSIS — H04563 Stenosis of bilateral lacrimal punctum: Secondary | ICD-10-CM | POA: Diagnosis not present

## 2018-03-19 DIAGNOSIS — H02132 Senile ectropion of right lower eyelid: Secondary | ICD-10-CM | POA: Diagnosis not present

## 2018-03-19 DIAGNOSIS — H02135 Senile ectropion of left lower eyelid: Secondary | ICD-10-CM | POA: Diagnosis not present

## 2018-04-02 DIAGNOSIS — H04563 Stenosis of bilateral lacrimal punctum: Secondary | ICD-10-CM | POA: Diagnosis not present

## 2018-04-30 DIAGNOSIS — Z09 Encounter for follow-up examination after completed treatment for conditions other than malignant neoplasm: Secondary | ICD-10-CM | POA: Diagnosis not present

## 2018-06-20 ENCOUNTER — Inpatient Hospital Stay (HOSPITAL_COMMUNITY)
Admission: EM | Admit: 2018-06-20 | Discharge: 2018-06-30 | DRG: 056 | Disposition: A | Discharge status: Home / Self Care | Payer: PPO | Attending: Family Medicine | Admitting: Family Medicine

## 2018-06-20 ENCOUNTER — Inpatient Hospital Stay (HOSPITAL_COMMUNITY): Payer: PPO

## 2018-06-20 ENCOUNTER — Emergency Department (HOSPITAL_COMMUNITY): Payer: PPO

## 2018-06-20 ENCOUNTER — Encounter (HOSPITAL_COMMUNITY): Payer: Self-pay | Admitting: Emergency Medicine

## 2018-06-20 ENCOUNTER — Other Ambulatory Visit: Payer: Self-pay

## 2018-06-20 DIAGNOSIS — G9340 Encephalopathy, unspecified: Secondary | ICD-10-CM | POA: Diagnosis not present

## 2018-06-20 DIAGNOSIS — I252 Old myocardial infarction: Secondary | ICD-10-CM

## 2018-06-20 DIAGNOSIS — Z4682 Encounter for fitting and adjustment of non-vascular catheter: Secondary | ICD-10-CM | POA: Diagnosis not present

## 2018-06-20 DIAGNOSIS — E7800 Pure hypercholesterolemia, unspecified: Secondary | ICD-10-CM | POA: Diagnosis not present

## 2018-06-20 DIAGNOSIS — E669 Obesity, unspecified: Secondary | ICD-10-CM | POA: Diagnosis present

## 2018-06-20 DIAGNOSIS — I10 Essential (primary) hypertension: Secondary | ICD-10-CM | POA: Diagnosis present

## 2018-06-20 DIAGNOSIS — R9431 Abnormal electrocardiogram [ECG] [EKG]: Secondary | ICD-10-CM | POA: Diagnosis not present

## 2018-06-20 DIAGNOSIS — R569 Unspecified convulsions: Secondary | ICD-10-CM

## 2018-06-20 DIAGNOSIS — I2510 Atherosclerotic heart disease of native coronary artery without angina pectoris: Secondary | ICD-10-CM | POA: Diagnosis present

## 2018-06-20 DIAGNOSIS — T426X5A Adverse effect of other antiepileptic and sedative-hypnotic drugs, initial encounter: Secondary | ICD-10-CM | POA: Diagnosis not present

## 2018-06-20 DIAGNOSIS — R079 Chest pain, unspecified: Secondary | ICD-10-CM | POA: Diagnosis not present

## 2018-06-20 DIAGNOSIS — Z8249 Family history of ischemic heart disease and other diseases of the circulatory system: Secondary | ICD-10-CM

## 2018-06-20 DIAGNOSIS — R0682 Tachypnea, not elsewhere classified: Secondary | ICD-10-CM | POA: Diagnosis not present

## 2018-06-20 DIAGNOSIS — I5023 Acute on chronic systolic (congestive) heart failure: Secondary | ICD-10-CM | POA: Diagnosis not present

## 2018-06-20 DIAGNOSIS — G4700 Insomnia, unspecified: Secondary | ICD-10-CM | POA: Diagnosis present

## 2018-06-20 DIAGNOSIS — E782 Mixed hyperlipidemia: Secondary | ICD-10-CM | POA: Diagnosis present

## 2018-06-20 DIAGNOSIS — R402212 Coma scale, best verbal response, none, at arrival to emergency department: Secondary | ICD-10-CM | POA: Diagnosis present

## 2018-06-20 DIAGNOSIS — E8770 Fluid overload, unspecified: Secondary | ICD-10-CM | POA: Diagnosis not present

## 2018-06-20 DIAGNOSIS — K219 Gastro-esophageal reflux disease without esophagitis: Secondary | ICD-10-CM | POA: Diagnosis not present

## 2018-06-20 DIAGNOSIS — J9601 Acute respiratory failure with hypoxia: Secondary | ICD-10-CM

## 2018-06-20 DIAGNOSIS — I5021 Acute systolic (congestive) heart failure: Secondary | ICD-10-CM | POA: Diagnosis not present

## 2018-06-20 DIAGNOSIS — I6601 Occlusion and stenosis of right middle cerebral artery: Secondary | ICD-10-CM | POA: Diagnosis not present

## 2018-06-20 DIAGNOSIS — Z0189 Encounter for other specified special examinations: Secondary | ICD-10-CM

## 2018-06-20 DIAGNOSIS — E7220 Disorder of urea cycle metabolism, unspecified: Secondary | ICD-10-CM | POA: Diagnosis not present

## 2018-06-20 DIAGNOSIS — I6501 Occlusion and stenosis of right vertebral artery: Secondary | ICD-10-CM | POA: Diagnosis not present

## 2018-06-20 DIAGNOSIS — Z87891 Personal history of nicotine dependence: Secondary | ICD-10-CM

## 2018-06-20 DIAGNOSIS — R140 Abdominal distension (gaseous): Secondary | ICD-10-CM | POA: Diagnosis not present

## 2018-06-20 DIAGNOSIS — N183 Chronic kidney disease, stage 3 unspecified: Secondary | ICD-10-CM

## 2018-06-20 DIAGNOSIS — R0602 Shortness of breath: Secondary | ICD-10-CM | POA: Diagnosis not present

## 2018-06-20 DIAGNOSIS — R402122 Coma scale, eyes open, to pain, at arrival to emergency department: Secondary | ICD-10-CM | POA: Diagnosis present

## 2018-06-20 DIAGNOSIS — G92 Toxic encephalopathy: Secondary | ICD-10-CM | POA: Diagnosis present

## 2018-06-20 DIAGNOSIS — E785 Hyperlipidemia, unspecified: Secondary | ICD-10-CM | POA: Diagnosis present

## 2018-06-20 DIAGNOSIS — Z6835 Body mass index (BMI) 35.0-35.9, adult: Secondary | ICD-10-CM

## 2018-06-20 DIAGNOSIS — E871 Hypo-osmolality and hyponatremia: Secondary | ICD-10-CM | POA: Diagnosis not present

## 2018-06-20 DIAGNOSIS — Z8719 Personal history of other diseases of the digestive system: Secondary | ICD-10-CM

## 2018-06-20 DIAGNOSIS — R402352 Coma scale, best motor response, localizes pain, at arrival to emergency department: Secondary | ICD-10-CM | POA: Diagnosis present

## 2018-06-20 DIAGNOSIS — S0990XA Unspecified injury of head, initial encounter: Secondary | ICD-10-CM | POA: Diagnosis not present

## 2018-06-20 DIAGNOSIS — I130 Hypertensive heart and chronic kidney disease with heart failure and stage 1 through stage 4 chronic kidney disease, or unspecified chronic kidney disease: Secondary | ICD-10-CM | POA: Diagnosis not present

## 2018-06-20 DIAGNOSIS — E876 Hypokalemia: Secondary | ICD-10-CM | POA: Diagnosis present

## 2018-06-20 DIAGNOSIS — J9602 Acute respiratory failure with hypercapnia: Secondary | ICD-10-CM | POA: Diagnosis not present

## 2018-06-20 DIAGNOSIS — I69398 Other sequelae of cerebral infarction: Secondary | ICD-10-CM | POA: Diagnosis not present

## 2018-06-20 DIAGNOSIS — I6611 Occlusion and stenosis of right anterior cerebral artery: Secondary | ICD-10-CM | POA: Diagnosis not present

## 2018-06-20 DIAGNOSIS — R404 Transient alteration of awareness: Secondary | ICD-10-CM | POA: Diagnosis not present

## 2018-06-20 DIAGNOSIS — I5041 Acute combined systolic (congestive) and diastolic (congestive) heart failure: Secondary | ICD-10-CM | POA: Diagnosis not present

## 2018-06-20 DIAGNOSIS — J9811 Atelectasis: Secondary | ICD-10-CM | POA: Diagnosis present

## 2018-06-20 DIAGNOSIS — I6330 Cerebral infarction due to thrombosis of unspecified cerebral artery: Secondary | ICD-10-CM | POA: Diagnosis not present

## 2018-06-20 DIAGNOSIS — I639 Cerebral infarction, unspecified: Secondary | ICD-10-CM | POA: Diagnosis not present

## 2018-06-20 DIAGNOSIS — G9341 Metabolic encephalopathy: Secondary | ICD-10-CM | POA: Diagnosis not present

## 2018-06-20 DIAGNOSIS — S199XXA Unspecified injury of neck, initial encounter: Secondary | ICD-10-CM | POA: Diagnosis not present

## 2018-06-20 DIAGNOSIS — Z4659 Encounter for fitting and adjustment of other gastrointestinal appliance and device: Secondary | ICD-10-CM

## 2018-06-20 DIAGNOSIS — I361 Nonrheumatic tricuspid (valve) insufficiency: Secondary | ICD-10-CM | POA: Diagnosis not present

## 2018-06-20 DIAGNOSIS — E870 Hyperosmolality and hypernatremia: Secondary | ICD-10-CM | POA: Diagnosis not present

## 2018-06-20 DIAGNOSIS — J9690 Respiratory failure, unspecified, unspecified whether with hypoxia or hypercapnia: Secondary | ICD-10-CM | POA: Diagnosis not present

## 2018-06-20 DIAGNOSIS — R4182 Altered mental status, unspecified: Secondary | ICD-10-CM

## 2018-06-20 DIAGNOSIS — E2740 Unspecified adrenocortical insufficiency: Secondary | ICD-10-CM | POA: Diagnosis present

## 2018-06-20 DIAGNOSIS — R4020 Unspecified coma: Secondary | ICD-10-CM | POA: Diagnosis not present

## 2018-06-20 DIAGNOSIS — T41295A Adverse effect of other general anesthetics, initial encounter: Secondary | ICD-10-CM | POA: Diagnosis not present

## 2018-06-20 DIAGNOSIS — Z885 Allergy status to narcotic agent status: Secondary | ICD-10-CM

## 2018-06-20 DIAGNOSIS — R7989 Other specified abnormal findings of blood chemistry: Secondary | ICD-10-CM | POA: Diagnosis not present

## 2018-06-20 DIAGNOSIS — R001 Bradycardia, unspecified: Secondary | ICD-10-CM | POA: Diagnosis not present

## 2018-06-20 DIAGNOSIS — Z955 Presence of coronary angioplasty implant and graft: Secondary | ICD-10-CM

## 2018-06-20 DIAGNOSIS — Z7982 Long term (current) use of aspirin: Secondary | ICD-10-CM

## 2018-06-20 DIAGNOSIS — B370 Candidal stomatitis: Secondary | ICD-10-CM | POA: Diagnosis not present

## 2018-06-20 DIAGNOSIS — Z9911 Dependence on respirator [ventilator] status: Secondary | ICD-10-CM | POA: Diagnosis not present

## 2018-06-20 DIAGNOSIS — Z79891 Long term (current) use of opiate analgesic: Secondary | ICD-10-CM

## 2018-06-20 DIAGNOSIS — K760 Fatty (change of) liver, not elsewhere classified: Secondary | ICD-10-CM | POA: Diagnosis not present

## 2018-06-20 DIAGNOSIS — I340 Nonrheumatic mitral (valve) insufficiency: Secondary | ICD-10-CM | POA: Diagnosis not present

## 2018-06-20 DIAGNOSIS — Z87442 Personal history of urinary calculi: Secondary | ICD-10-CM

## 2018-06-20 DIAGNOSIS — Z79899 Other long term (current) drug therapy: Secondary | ICD-10-CM

## 2018-06-20 LAB — PROTIME-INR
INR: 1.02
Prothrombin Time: 13.3 seconds (ref 11.4–15.2)

## 2018-06-20 LAB — URINALYSIS, COMPLETE (UACMP) WITH MICROSCOPIC
BILIRUBIN URINE: NEGATIVE
Bacteria, UA: NONE SEEN
Glucose, UA: NEGATIVE mg/dL
KETONES UR: NEGATIVE mg/dL
Leukocytes, UA: NEGATIVE
Nitrite: NEGATIVE
PROTEIN: 100 mg/dL — AB
Specific Gravity, Urine: 1.017 (ref 1.005–1.030)
pH: 5 (ref 5.0–8.0)

## 2018-06-20 LAB — COMPREHENSIVE METABOLIC PANEL
ALBUMIN: 3.7 g/dL (ref 3.5–5.0)
ALBUMIN: 3.3 g/dL — AB (ref 3.5–5.0)
ALT: 53 U/L — AB (ref 0–44)
ALT: 55 U/L — ABNORMAL HIGH (ref 0–44)
AST: 39 U/L (ref 15–41)
AST: 45 U/L — ABNORMAL HIGH (ref 15–41)
Alkaline Phosphatase: 47 U/L (ref 38–126)
Alkaline Phosphatase: 40 U/L (ref 38–126)
Anion gap: 17 — ABNORMAL HIGH (ref 5–15)
Anion gap: 12 (ref 5–15)
BILIRUBIN TOTAL: 0.8 mg/dL (ref 0.3–1.2)
BUN: 22 mg/dL (ref 8–23)
BUN: 18 mg/dL (ref 8–23)
CO2: 16 mmol/L — ABNORMAL LOW (ref 22–32)
CO2: 19 mmol/L — ABNORMAL LOW (ref 22–32)
CREATININE: 1.54 mg/dL — AB (ref 0.61–1.24)
Calcium: 9.2 mg/dL (ref 8.9–10.3)
Calcium: 9.1 mg/dL (ref 8.9–10.3)
Chloride: 105 mmol/L (ref 98–111)
Chloride: 106 mmol/L (ref 98–111)
Creatinine, Ser: 1.25 mg/dL — ABNORMAL HIGH (ref 0.61–1.24)
GFR calc Af Amer: >60 mL/min (ref >60)
GFR calc non Af Amer: 44 mL/min — ABNORMAL LOW (ref >60)
GFR calc non Af Amer: 57 mL/min — ABNORMAL LOW (ref >60)
GFR, EST AFRICAN AMERICAN: 51 mL/min — AB (ref >60)
GLUCOSE: 118 mg/dL — AB (ref 70–99)
Glucose, Bld: 147 mg/dL — ABNORMAL HIGH (ref 70–99)
POTASSIUM: 3.5 mmol/L (ref 3.5–5.1)
Potassium: 3.9 mmol/L (ref 3.5–5.1)
SODIUM: 138 mmol/L (ref 135–145)
Sodium: 137 mmol/L (ref 135–145)
Total Bilirubin: 0.7 mg/dL (ref 0.3–1.2)
Total Protein: 6.4 g/dL — ABNORMAL LOW (ref 6.5–8.1)
Total Protein: 5.8 g/dL — ABNORMAL LOW (ref 6.5–8.1)

## 2018-06-20 LAB — GLUCOSE, CSF: Glucose, CSF: 64 mg/dL (ref 40–70)

## 2018-06-20 LAB — SALICYLATE LEVEL: Salicylate Lvl: <7 mg/dL (ref 2.8–30.0)

## 2018-06-20 LAB — CSF CELL COUNT WITH DIFFERENTIAL
RBC Count, CSF: 1 /mm3 — ABNORMAL HIGH
RBC Count, CSF: 1000 /mm3 — ABNORMAL HIGH
Tube #: 1
Tube #: 4
WBC, CSF: 1 /mm3 (ref 0–5)
WBC, CSF: 10 /mm3 — ABNORMAL HIGH (ref 0–5)

## 2018-06-20 LAB — RAPID URINE DRUG SCREEN, HOSP PERFORMED
Amphetamines: NOT DETECTED
Barbiturates: NOT DETECTED
Benzodiazepines: NOT DETECTED
Cocaine: NOT DETECTED
Opiates: NOT DETECTED
Tetrahydrocannabinol: NOT DETECTED

## 2018-06-20 LAB — CBC WITH DIFFERENTIAL/PLATELET
Abs Immature Granulocytes: 0.09 10*3/uL — ABNORMAL HIGH (ref 0.00–0.07)
Basophils Absolute: 0.1 10*3/uL (ref 0.0–0.1)
Basophils Relative: 1 %
EOS ABS: 0.8 10*3/uL — AB (ref 0.0–0.5)
EOS PCT: 6 %
HCT: 47.9 % (ref 39.0–52.0)
HEMOGLOBIN: 15.1 g/dL (ref 13.0–17.0)
Immature Granulocytes: 1 %
Lymphocytes Relative: 37 %
Lymphs Abs: 4.4 10*3/uL — ABNORMAL HIGH (ref 0.7–4.0)
MCH: 30.1 pg (ref 26.0–34.0)
MCHC: 31.5 g/dL (ref 30.0–36.0)
MCV: 95.4 fL (ref 80.0–100.0)
MONO ABS: 1.2 10*3/uL — AB (ref 0.1–1.0)
MONOS PCT: 10 %
Neutro Abs: 5.3 10*3/uL (ref 1.7–7.7)
Neutrophils Relative %: 45 %
Platelets: 269 10*3/uL (ref 150–400)
RBC: 5.02 MIL/uL (ref 4.22–5.81)
RDW: 12.9 % (ref 11.5–15.5)
WBC: 11.9 10*3/uL — ABNORMAL HIGH (ref 4.0–10.5)
nRBC: 0 % (ref 0.0–0.2)

## 2018-06-20 LAB — POCT I-STAT 7, (LYTES, BLD GAS, ICA,H+H)
Acid-base deficit: 1 mmol/L (ref 0.0–2.0)
Bicarbonate: 22.7 mmol/L (ref 20.0–28.0)
Calcium, Ion: 1.24 mmol/L (ref 1.15–1.40)
HCT: 37 % — ABNORMAL LOW (ref 39.0–52.0)
Hemoglobin: 12.6 g/dL — ABNORMAL LOW (ref 13.0–17.0)
O2 SAT: 100 %
PH ART: 7.437 (ref 7.350–7.450)
Patient temperature: 97.7
Potassium: 3.7 mmol/L (ref 3.5–5.1)
Sodium: 138 mmol/L (ref 135–145)
TCO2: 24 mmol/L (ref 22–32)
pCO2 arterial: 33.5 mmHg (ref 32.0–48.0)
pO2, Arterial: 215 mmHg — ABNORMAL HIGH (ref 83.0–108.0)

## 2018-06-20 LAB — BLOOD GAS, ARTERIAL
Acid-base deficit: 1.7 mmol/L (ref 0.0–2.0)
Bicarbonate: 22.7 mmol/L (ref 20.0–28.0)
FIO2: 100
O2 SAT: 98.5 %
Patient temperature: 36.5
pCO2 arterial: 44.2 mmHg (ref 32.0–48.0)
pH, Arterial: 7.34 — ABNORMAL LOW (ref 7.350–7.450)
pO2, Arterial: 149 mmHg — ABNORMAL HIGH (ref 83.0–108.0)

## 2018-06-20 LAB — TRIGLYCERIDES: TRIGLYCERIDES: 142 mg/dL (ref <150)

## 2018-06-20 LAB — MAGNESIUM: Magnesium: 2.1 mg/dL (ref 1.7–2.4)

## 2018-06-20 LAB — CBG MONITORING, ED: Glucose-Capillary: 110 mg/dL — ABNORMAL HIGH (ref 70–99)

## 2018-06-20 LAB — ACETAMINOPHEN LEVEL: Acetaminophen (Tylenol), Serum: <10 ug/mL — ABNORMAL LOW (ref 10–30)

## 2018-06-20 LAB — TROPONIN I: Troponin I: 2.56 ng/mL (ref <0.03)

## 2018-06-20 LAB — PROTEIN, CSF: Total  Protein, CSF: <6 mg/dL — ABNORMAL LOW (ref 15–45)

## 2018-06-20 LAB — ETHANOL: Alcohol, Ethyl (B): <10 mg/dL (ref <10)

## 2018-06-20 LAB — AMMONIA: Ammonia: 122 umol/L — ABNORMAL HIGH (ref 9–35)

## 2018-06-20 LAB — MRSA PCR SCREENING: MRSA BY PCR: NEGATIVE

## 2018-06-20 LAB — PHOSPHORUS: Phosphorus: 1.9 mg/dL — ABNORMAL LOW (ref 2.5–4.6)

## 2018-06-20 MED ORDER — FENTANYL 2500MCG IN NS 250ML (10MCG/ML) PREMIX INFUSION
25.0000 ug/h | INTRAVENOUS | Status: DC
  Administered 2018-06-20: 100 ug/h via INTRAVENOUS
  Administered 2018-06-20: 25 ug/h via INTRAVENOUS
  Administered 2018-06-21: 50 ug/h via INTRAVENOUS
  Administered 2018-06-22: 200 ug/h via INTRAVENOUS
  Administered 2018-06-22: 250 ug/h via INTRAVENOUS
  Administered 2018-06-23: 175 ug/h via INTRAVENOUS
  Filled 2018-06-20 (×5): qty 250

## 2018-06-20 MED ORDER — PROPOFOL 1000 MG/100ML IV EMUL
INTRAVENOUS | Status: AC
  Filled 2018-06-20: qty 100

## 2018-06-20 MED ORDER — CHLORHEXIDINE GLUCONATE 0.12% ORAL RINSE (MEDLINE KIT)
15.0000 mL | Freq: Two times a day (BID) | OROMUCOSAL | Status: DC
  Administered 2018-06-20 – 2018-06-24 (×7): 15 mL via OROMUCOSAL

## 2018-06-20 MED ORDER — ACETAMINOPHEN 325 MG PO TABS
650.0000 mg | ORAL_TABLET | ORAL | Status: DC | PRN
  Administered 2018-06-23 – 2018-06-24 (×2): 650 mg via ORAL
  Filled 2018-06-20 (×3): qty 2

## 2018-06-20 MED ORDER — ORAL CARE MOUTH RINSE
15.0000 mL | OROMUCOSAL | Status: DC
  Administered 2018-06-21 – 2018-06-24 (×37): 15 mL via OROMUCOSAL

## 2018-06-20 MED ORDER — IOPAMIDOL (ISOVUE-370) INJECTION 76%
100.0000 mL | Freq: Once | INTRAVENOUS | Status: AC | PRN
  Administered 2018-06-20: 100 mL via INTRAVENOUS

## 2018-06-20 MED ORDER — FENTANYL CITRATE (PF) 100 MCG/2ML IJ SOLN
INTRAMUSCULAR | Status: AC
  Administered 2018-06-20: 100 ug
  Filled 2018-06-20: qty 2

## 2018-06-20 MED ORDER — ETOMIDATE 2 MG/ML IV SOLN
20.0000 mg | Freq: Once | INTRAVENOUS | Status: AC
  Administered 2018-06-20: 20 mg via INTRAVENOUS

## 2018-06-20 MED ORDER — PROPOFOL 1000 MG/100ML IV EMUL
0.0000 ug/kg/min | INTRAVENOUS | Status: DC
  Administered 2018-06-20: 70 ug/kg/min via INTRAVENOUS
  Administered 2018-06-20: 30 ug/kg/min via INTRAVENOUS
  Administered 2018-06-20: 50 ug/kg/min via INTRAVENOUS
  Administered 2018-06-21: 15 ug/kg/min via INTRAVENOUS
  Administered 2018-06-21: 50 ug/kg/min via INTRAVENOUS
  Administered 2018-06-21 (×2): 30 ug/kg/min via INTRAVENOUS
  Administered 2018-06-22 (×4): 50 ug/kg/min via INTRAVENOUS
  Administered 2018-06-22: 20 ug/kg/min via INTRAVENOUS
  Filled 2018-06-20 (×11): qty 100

## 2018-06-20 MED ORDER — FENTANYL CITRATE (PF) 100 MCG/2ML IJ SOLN
50.0000 ug | Freq: Once | INTRAMUSCULAR | Status: AC
  Administered 2018-06-20: 50 ug via INTRAVENOUS

## 2018-06-20 MED ORDER — LEVETIRACETAM 500 MG PO TABS
500.0000 mg | ORAL_TABLET | Freq: Two times a day (BID) | ORAL | Status: DC
  Filled 2018-06-20: qty 1

## 2018-06-20 MED ORDER — DEXAMETHASONE SODIUM PHOSPHATE 4 MG/ML IJ SOLN
10.0000 mg | Freq: Once | INTRAMUSCULAR | Status: AC
  Administered 2018-06-20: 10 mg via INTRAVENOUS
  Filled 2018-06-20: qty 3

## 2018-06-20 MED ORDER — MIDAZOLAM HCL 2 MG/2ML IJ SOLN
2.0000 mg | INTRAMUSCULAR | Status: DC | PRN
  Administered 2018-06-21 (×2): 2 mg via INTRAVENOUS
  Administered 2018-06-21: 5 mg via INTRAVENOUS

## 2018-06-20 MED ORDER — FENTANYL CITRATE (PF) 100 MCG/2ML IJ SOLN: 100.0000 ug | INTRAMUSCULAR | Status: DC | PRN

## 2018-06-20 MED ORDER — SODIUM CHLORIDE 0.9 % IV SOLN
750.0000 mg | Freq: Once | INTRAVENOUS | Status: AC
  Administered 2018-06-20: 750 mg via INTRAVENOUS
  Filled 2018-06-20: qty 7.5

## 2018-06-20 MED ORDER — SUCCINYLCHOLINE CHLORIDE 20 MG/ML IJ SOLN
100.0000 mg | Freq: Once | INTRAMUSCULAR | Status: AC
  Administered 2018-06-20: 100 mg via INTRAVENOUS

## 2018-06-20 MED ORDER — FAMOTIDINE IN NACL 20-0.9 MG/50ML-% IV SOLN
20.0000 mg | Freq: Two times a day (BID) | INTRAVENOUS | Status: DC
  Administered 2018-06-20 – 2018-06-21 (×2): 20 mg via INTRAVENOUS
  Filled 2018-06-20 (×2): qty 50

## 2018-06-20 MED ORDER — FENTANYL CITRATE (PF) 100 MCG/2ML IJ SOLN
100.0000 ug | INTRAMUSCULAR | Status: DC | PRN
  Administered 2018-06-23 – 2018-06-24 (×7): 100 ug via INTRAVENOUS
  Filled 2018-06-20 (×7): qty 2

## 2018-06-20 MED ORDER — ONDANSETRON HCL 4 MG/2ML IJ SOLN: 4.0000 mg | Freq: Four times a day (QID) | INTRAMUSCULAR | Status: DC | PRN

## 2018-06-20 MED ORDER — POTASSIUM PHOSPHATES 15 MMOLE/5ML IV SOLN
30.0000 mmol | Freq: Once | INTRAVENOUS | Status: AC
  Administered 2018-06-21: 30 mmol via INTRAVENOUS
  Filled 2018-06-20: qty 10

## 2018-06-20 MED ORDER — SODIUM CHLORIDE 0.9 % IV BOLUS
500.0000 mL | Freq: Once | INTRAVENOUS | Status: AC
  Administered 2018-06-20: 500 mL via INTRAVENOUS

## 2018-06-20 MED ORDER — SODIUM CHLORIDE 0.9 % IV SOLN
2.0000 g | Freq: Once | INTRAVENOUS | Status: AC
  Administered 2018-06-20: 2 g via INTRAVENOUS
  Filled 2018-06-20: qty 20

## 2018-06-20 MED ORDER — PROPOFOL 1000 MG/100ML IV EMUL
5.0000 ug/kg/min | INTRAVENOUS | Status: DC
  Administered 2018-06-20: 10 ug/kg/min via INTRAVENOUS

## 2018-06-20 MED ORDER — FENTANYL CITRATE (PF) 100 MCG/2ML IJ SOLN: 3.0000 ug/kg | Freq: Once | INTRAMUSCULAR | Status: DC

## 2018-06-20 MED ORDER — PROPOFOL 1000 MG/100ML IV EMUL
5.0000 ug/kg/min | INTRAVENOUS | Status: DC
  Administered 2018-06-20: 20 ug/kg/min via INTRAVENOUS
  Administered 2018-06-20: 60 ug/kg/min via INTRAVENOUS
  Filled 2018-06-20: qty 100

## 2018-06-20 MED ORDER — SODIUM CHLORIDE 0.9 % IV SOLN
INTRAVENOUS | Status: DC
  Administered 2018-06-20: 19:00:00 via INTRAVENOUS
  Administered 2018-06-20: 125 mL/h via INTRAVENOUS
  Administered 2018-06-21 – 2018-06-22 (×2): via INTRAVENOUS
  Administered 2018-06-22: 1000 mL via INTRAVENOUS
  Administered 2018-06-22: 04:00:00 via INTRAVENOUS
  Administered 2018-06-23: 1000 mL via INTRAVENOUS

## 2018-06-20 MED ORDER — FENTANYL BOLUS VIA INFUSION
25.0000 ug | INTRAVENOUS | Status: DC | PRN
  Administered 2018-06-22: 50 ug via INTRAVENOUS
  Administered 2018-06-22: 25 ug via INTRAVENOUS
  Filled 2018-06-20: qty 25

## 2018-06-20 MED ORDER — MIDAZOLAM HCL 2 MG/2ML IJ SOLN
2.0000 mg | INTRAMUSCULAR | Status: AC | PRN
  Administered 2018-06-21 – 2018-06-22 (×3): 2 mg via INTRAVENOUS
  Filled 2018-06-20 (×5): qty 2

## 2018-06-20 NOTE — ED Triage Notes (Signed)
Ems called out for seizure.  Upon arrival to scene pt was unresponsive, bit his tongue and urinated himself.  Pt became combative and remains combative upon arrival to ed.  Pt not answering questions

## 2018-06-20 NOTE — Consult Note (Signed)
Neurology Consultation Reason for Consult: Seizures Referring Physician: Hillard Danker  CC: Seizures  History is obtained from: Patient  HPI: Jeremy [name] is a 72 y.o. male with a history of right carotid stenosis and old, by imaging, right parietal infarct though the family was not aware of this who presents with new onset seizure.  The description is that the patient was sitting at lunch, when he stood up and then fell forwards and began having convulsive activity.  It is not clear exactly how long this lasted, but he was agitated and combative on EMS arrival.  Due to the continued agitation and combativeness, he was intubated at [facility].  Given the new onset seizures an LP was performed at [facility] which revealed no signs of infection.    He has no history of seizure, and had no complaints today prior to the event.   ROS: Unable to obtain due to altered mental status.   Past Medical History:  Diagnosis Date  . Arthritis   . Carotid stenosis, asymptomatic, right   . Carpal tunnel syndrome    bilateral  . Coronary artery disease   . GERD (gastroesophageal reflux disease)   . History of kidney stones   . Hx of colonic polyp   . Hypercholesterolemia   . Hypertension   . Myocardial infarction ([place])    hx of  . Obesity      Family History  Problem Relation Age of Onset  . Other Father 45       deceased old age  . Coronary artery disease Mother 70       deceased  . Hypertension Mother   . Other Sister 29       living and healthy  . Coronary artery disease Brother   . Skin cancer Brother   . Cancer Other        family hx of  . Diabetes Other        family hx of  . Coronary artery disease Other        family hx of male < 25  . Arthritis Other        family hx of  . Asthma Other        family hx of     Social History:  reports that he quit smoking about 28 years ago. He has never used smokeless tobacco. He reports that he does not drink alcohol or use  drugs.   Exam: Current vital signs: BP 135/69 (BP Location: Left Arm)   Pulse 63   Temp (!) 97.5 F (36.4 C) (Bladder)   Resp 18   Ht 5\' 8"  (1.727 m)   Wt 97.5 kg   SpO2 100%   BMI 32.69 kg/m  Vital signs in last 24 hours: Temp:  [96.3 F (35.7 C)-97.8 F (36.6 C)] 97.5 F (36.4 C) (01/29 1545) Pulse Rate:  [63-128] 63 (01/29 1545) Resp:  [12-40] 18 (01/29 1545) BP: (107-184)/(58-93) 135/69 (01/29 1545) SpO2:  [96 %-100 %] 100 % (01/29 1545) FiO2 (%):  [90 %-100 %] 100 % (01/29 1535) Weight:  [97.5 kg] 97.5 kg (01/29 1103)   Physical Exam  Constitutional: Appears well-developed and well-nourished.  Psych: Affect appropriate to situation Eyes: No scleral injection HENT: ET tube in place.  Head: Normocephalic.  Cardiovascular: Normal rate and regular rhythm.  Respiratory: ventilated GI: Soft.  No distension. There is no tenderness.  Skin: WDI  Neuro: Mental Status: Patient awakens easily, becomes agitated, but does follow  commands.  Cranial Nerves: II: fixates and tracks, but does not blink to either side. Pupils are equal, round, and reactive to light.   III,IV, VI: EOMI without ptosis or diploplia.  V: blinks to eyelid stim bilaterally.  VII: Facial movement is difficult to judge due to ET tube.  Motor: Moves all extremities voluntarily with at least 4+/5 strength, though ? Mild weakness on the left compared to right.  Sensory: Responds to nox stim bilaterally.  Deep Tendon Reflexes: 2+ and symmetric in  patellae.  Cerebellar: Does not perform.   I have reviewed labs in epic and the results pertinent to this consultation are: CSF  tube 1 1000RBC, 10 WBC Tube 4 1 RBC, 1 WBC Protein < 6 Glucose 64  Ammonia 122  I have reviewed the images obtained:CT head - old R parietal infarct.   Stat EEG-no acute findings.  Impression: 73 yo M with old stroke, now with new onset seizures. I suspect that this is related to previous stroke.  Though his initial CSF  tube does show a few white cells, given the discordance between this and the second tube, I would favor going with a second tube results given that there was some blood contamination of the first tube.  Certainly with normal glucose and low protein, this would argue strongly against any type of CNS infection.  Recommendations: 1) can D/C meningitic coverage 2) MRI brain 3) continue Keppra 500 mg twice daily 4) ventilator management per critical care   Roland Rack, MD [facility] [phone]  If 7pm- 7am, please page neurology on call as listed in [place].

## 2018-06-20 NOTE — H&P (Signed)
NAME:  Jeremy [name], MRN:  [id number], DOB:  10-31-1945, LOS: 0 ADMISSION DATE:  06/20/2018, CONSULTATION DATE: June 20, 2018 REFERRING MD: Dr. Tomi Bamberger , CHIEF COMPLAINT: Acute loss of consciousness  Brief History   73 year old male with a past medical history significant for hypertension, cerebrovascular disease was in his usual state of health on June 20, 2018 when he suddenly had a seizure-like episode at [employer].  Was intubated for airway protection in the setting of severe combativeness.  History of present illness   This is a pleasant 73 year old male who came to our facility on June 20, 2018 in the setting of seizure-like episode, acute loss of consciousness followed by severe combativeness.  His family provides history because he was intubated on my arrival.  They state that he has been in his usual state of health.  Apparently had carotid artery surgery several months ago.  At that time he was noted to have some degree of cerebral artery stenosis.  He was awaiting neurology consultation but that had not happened yet.  In the interim since September when he last had his surgery he was complaining of significant headache on a regular basis.  They said that this has been happening off and on.  They denied any sort of alcohol intake, he does not use drugs, he does not smoke cigarettes.  They denied a history of diabetes.  At the [facility] emergency room he required intubation for airway protection.  He had no seizure activity there.  He was noted to have an elevated ammonia.  Past Medical History  Hypertension, coronary artery disease, hypercholesterolemia, gastroesophageal reflux disease, carotid artery stenosis,  Significant Hospital Events   June 20, 2018 admission  Consults:  Neurology  Procedures:  Lumbar puncture January 29: No pleocytosis  Significant Diagnostic Tests:  CT head June 20, 2018 showed no acute intracranial abnormality, chronic right  temporoparietal infarct which had been unchanged, cervical spondylosis CT angiogram chest June 20, 2018 no pulmonary embolism, bibasilar atelectasis, some aortic atherosclerosis noted  Micro Data:  January 29 CSF culture  Antimicrobials:    Interim history/subjective:     Objective   Blood pressure 133/72, pulse 63, temperature 97.7 F (36.5 C), resp. rate 18, height 5\' 8"  (1.727 m), weight 97.5 kg, SpO2 100 %.    Vent Mode: PRVC FiO2 (%):  [90 %-100 %] 100 % Set Rate:  [18 bmp] 18 bmp Vt Set:  [550 mL] 550 mL PEEP:  [5 cmH20] 5 cmH20 Plateau Pressure:  [16 cmH20-25 cmH20] 25 cmH20   Intake/Output Summary (Last 24 hours) at 06/20/2018 1749 Last data filed at 06/20/2018 1058 Gross per 24 hour  Intake 500 ml  Output -  Net 500 ml   Filed Weights   06/20/18 0931 06/20/18 1103  Weight: 97.5 kg 97.5 kg    Examination:  General:  In bed on vent HENT: some bruising over forehead, ETT in place PULM: CTA B, vent supported breathing CV: RRR, no mgr GI: BS+, soft, nontender MSK: normal bulk and tone Neuro: sedated on vent    Resolved Hospital Problem list     Assessment & Plan:  Acute respiratory failure with hypoxemia due to inability to protect airway: ABG within normal limits, chest x-ray without evidence of acute pulmonary process, -Continue full pulmonary ventilator support for now Ventilator associated pneumonia prevention protocol Daily wake-up assessment/spontaneous breathing trial Hopeful for extubation on June 21, 2018 if no other acute process  Acute onset seizure activity: -Appreciate neurology consult -  Continue Keppra -Follow-up MRI brain  Increased ammonia: Unclear etiology -Check right upper quadrant ultrasound -Repeat in a.m., if still encephalopathic then start lactulose  Hypertension: -Monitor blood pressure here in the ICU  Sedation needs in setting of mechanical ventilation -PAD protocol RA SS score 0 to -1, propofol infusion, PRN  fentanyl  Best practice:  Diet: N.p.o. Pain/Anxiety/Delirium protocol (if indicated): Yes, as above VAP protocol (if indicated): Yes DVT prophylaxis: SCD, if no acute process on MRI then start subcu heparin GI prophylaxis: Famotidine Glucose control: Sliding scale insulin, monitor Mobility: Bedrest Code Status: Full Family Communication: Updated girlfriend and son bedside Disposition: ICU  Labs   CBC: Recent Labs  Lab 06/20/18 0928 06/20/18 1736  WBC 11.9*  --   NEUTROABS 5.3  --   HGB 15.1 12.6*  HCT 47.9 37.0*  MCV 95.4  --   PLT 269  --     Basic Metabolic Panel: Recent Labs  Lab 06/20/18 0928 06/20/18 1736  NA 138 138  K 3.9 3.7  CL 105  --   CO2 16*  --   GLUCOSE 118*  --   BUN 22  --   CREATININE 1.54*  --   CALCIUM 9.2  --    GFR: Estimated Creatinine Clearance: 49.1 mL/min (A) (by C-G formula based on SCr of 1.54 mg/dL (H)). Recent Labs  Lab 06/20/18 0928  WBC 11.9*    Liver Function Tests: Recent Labs  Lab 06/20/18 0928  AST 39  ALT 53*  ALKPHOS 47  BILITOT 0.7  PROT 6.4*  ALBUMIN 3.7   No results for input(s): LIPASE, AMYLASE in the last 168 hours. Recent Labs  Lab 06/20/18 0928  AMMONIA 122*    ABG    Component Value Date/Time   PHART 7.437 06/20/2018 1736   PCO2ART 33.5 06/20/2018 1736   PO2ART 215.0 (H) 06/20/2018 1736   HCO3 22.7 06/20/2018 1736   TCO2 24 06/20/2018 1736   ACIDBASEDEF 1.0 06/20/2018 1736   O2SAT 100.0 06/20/2018 1736     Coagulation Profile: Recent Labs  Lab 06/20/18 0938  INR 1.02    Cardiac Enzymes: No results for input(s): CKTOTAL, CKMB, CKMBINDEX, TROPONINI in the last 168 hours.  HbA1C: No results found for: HGBA1C  CBG: Recent Labs  Lab 06/20/18 0919  GLUCAP 110*    Review of Systems:   Cannot obtain  Past Medical History  He,  has a past medical history of Arthritis, Carotid stenosis, asymptomatic, right, Carpal tunnel syndrome, Coronary artery disease, GERD (gastroesophageal  reflux disease), History of kidney stones, colonic polyp, Hypercholesterolemia, Hypertension, Myocardial infarction ([place]), and Obesity.   Surgical History    Past Surgical History:  Procedure Laterality Date  . CARDIAC CATHETERIZATION    . CORONARY STENT PLACEMENT  2000   x3   . ENDARTERECTOMY Right 12/25/2017   Procedure: ENDARTERECTOMY CAROTID RIGHT;  Surgeon: Rosetta Posner, MD;  Location: [facility];  Service: Vascular;  Laterality: Right;  . PATCH ANGIOPLASTY Right 12/25/2017   Procedure: PATCH ANGIOPLASTY RIGHT CAROTID ARTERY;  Surgeon: Rosetta Posner, MD;  Location: [facility] OR;  Service: Vascular;  Laterality: Right;     Social History   reports that he quit smoking about 28 years ago. He has never used smokeless tobacco. He reports that he does not drink alcohol or use drugs.   Family History   His family history includes Arthritis in an other family member; Asthma in an other family member; Cancer in an other family member; Coronary artery disease  in his brother and another family member; Coronary artery disease (age of onset: 45) in his mother; Diabetes in an other family member; Hypertension in his mother; Other (age of onset: 77) in his father; Other (age of onset: 85) in his sister; Skin cancer in his brother.   Allergies Allergies  Allergen Reactions  . Codeine Anaphylaxis  . Oxycontin [Oxycodone Hcl] Shortness Of Breath and Itching  . Morphine And Related Itching     Home Medications  Prior to Admission medications   Medication Sig Start Date End Date Taking? Authorizing Provider  aspirin EC 81 MG tablet Take 81 mg by mouth daily.   Yes [provider]  clobetasol cream (TEMOVATE) 9.03 % Apply 1 application topically as needed. 07/11/13  Yes [provider]  diclofenac (VOLTAREN) 75 MG EC tablet Take 75 mg by mouth 2 (two) times daily.    Yes [provider]  enalapril (VASOTEC) 5 MG tablet Take by mouth. 08/26/13  Yes [provider]  gabapentin  (NEURONTIN) 300 MG capsule Take 300 mg by mouth daily as needed for pain. 11/30/17  Yes [provider]  ketoconazole (NIZORAL) 2 % shampoo daily. 03/05/18  Yes [provider]  metoprolol tartrate (LOPRESSOR) 50 MG tablet Take 50 mg by mouth 2 (two) times daily.   Yes [provider]  prochlorperazine (COMPAZINE) 10 MG tablet Take 1 tablet (10 mg total) by mouth 2 (two) times daily as needed (Headache). 10/27/17  Yes Upstill, Nehemiah Settle, PA-C  rosuvastatin (CRESTOR) 10 MG tablet daily. 03/05/18  Yes [provider]  traMADol (ULTRAM) 50 MG tablet Take 1 tablet (50 mg total) by mouth every 6 (six) hours as needed for moderate pain. 12/26/17  Yes Rhyne, Samantha J, PA-C  losartan (COZAAR) 100 MG tablet Take 100 mg by mouth daily. for high blood pressure 05/14/18   [provider]  nitroGLYCERIN (NITROSTAT) 0.4 MG SL tablet Place 0.4 mg under the tongue every 5 (five) minutes as needed for chest pain.     [provider]     Critical care time: 35 minutes    Roselie Awkward, MD [facility] Pager: [phone] Cell: [phone] If no response, call [phone]

## 2018-06-20 NOTE — Progress Notes (Signed)
Pt extremely agitated, okay to bolus 20mcg of propofol per Dr.Kirkpatrick

## 2018-06-20 NOTE — ED Notes (Signed)
PT intubated with 7.5 mm tube at 23 at the lip positive color change.

## 2018-06-20 NOTE — ED Notes (Signed)
Report given to [facility] with [facility] at this time. ETA 25-30 min.

## 2018-06-20 NOTE — Procedures (Signed)
History: 73 year old male with new onset seizures and postictal agitation  Sedation: Propofol, fentanyl  Technique: This is a 21 channel stat scalp EEG performed at the bedside with bipolar and monopolar montages arranged in accordance to the international 10/20 system of electrode placement. One channel was dedicated to EKG recording.    Background: There is a posterior dominant rhythm of 8 Hz which is poorly sustained.  It does appear to be better seen on the left than the right.  The background is dominated, however, by generalized irregular delta and theta activities.  Well-formed sleep structures are not seen.  Photic stimulation: Physiologic driving is performed  EEG Abnormalities: 1) generalized irregular slow activity 2) asymmetric PDR  Clinical Interpretation: EEG is consistent with a generalized nonspecific cerebral function (encephalopathy) as can be seen with postictal state or sedative medications among other causes.  There is also evidence for right posterior quadrant dysfunction consistent with the patient's known infarct in that region.   There was no seizure or seizure predisposition recorded on this study. Please note that lack of epileptiform activity on EEG does not preclude the possibility of epilepsy.   Roland Rack, MD [facility] [phone]  If 7pm- 7am, please page neurology on call as listed in [place].

## 2018-06-20 NOTE — ED Notes (Signed)
[facility] here to transport

## 2018-06-20 NOTE — Progress Notes (Signed)
CRITICAL VALUE ALERT  Critical Value:  Traponin 2.56  Date & Time Notied:  2000 1/29  Provider Notified: Warren Lacy CCM MD  Orders Received/Actions taken: No new orders, observe.

## 2018-06-20 NOTE — ED Provider Notes (Addendum)
[facility] Provider Note   CSN: [id number] Arrival date & time: 06/20/18  7262   Level 5 caveat: Altered mental status  History   Chief Complaint Chief Complaint  Patient presents with  . Seizures    HPI Jeremy [name] is a 73 y.o. male.  HPI Patient presented to the emergency room for evaluation of altered mental status.  According to the EMS report bystanders saw the patient stand up.  He then fell to the ground and had seizure-like activity.  When EMS arrived the patient was unresponsive.  He was incontinent of urine and bit his tongue.  During their evaluation the patient became very combative and had to be physically restrained.  Patient did not speak to them at all and continues not to be able to speak or communicate. Past Medical History:  Diagnosis Date  . Arthritis   . Carotid stenosis, asymptomatic, right   . Carpal tunnel syndrome    bilateral  . Coronary artery disease   . GERD (gastroesophageal reflux disease)   . History of kidney stones   . Hx of colonic polyp   . Hypercholesterolemia   . Hypertension   . Myocardial infarction ([place])    hx of  . Obesity     Patient Active Problem List   Diagnosis Date Noted  . Asymptomatic carotid artery stenosis without infarction, right 12/25/2017  . Pre-operative cardiovascular examination 11/03/2017  . Carotid artery disease ([place]) 11/03/2017  . Shoulder joint pain 09/17/2013  . Numbness around mouth 12/01/2011  . Erectile dysfunction 09/05/2010  . TRIGGER FINGER, LEFT MIDDLE 04/20/2010  . ANAL FISSURE 04/03/2009  . Mixed dyslipidemia 04/01/2009  . RECTAL BLEEDING 04/01/2009  . ABDOMINAL PAIN-LLQ 04/01/2009  . ABDOMINAL PAIN, GENERALIZED 03/30/2009  . OTITIS EXTERNA, LEFT 05/07/2008  . BRONCHITIS, ACUTE 04/21/2008  . CARPAL TUNNEL SYNDROME, BILATERAL 12/25/2007  . COLD SORE 03/12/2007  . CAD (coronary artery disease) 01/01/2007  . HYPERCHOLESTEROLEMIA, PURE 06/19/2006  . OBESITY NOS  06/19/2006  . HYPERTENSION 06/19/2006  . MYOCARDIAL INFARCTION, HX OF 06/19/2006  . COLONIC POLYPS, HX OF 06/19/2006    Past Surgical History:  Procedure Laterality Date  . CARDIAC CATHETERIZATION    . CORONARY STENT PLACEMENT  2000   x3   . ENDARTERECTOMY Right 12/25/2017   Procedure: ENDARTERECTOMY CAROTID RIGHT;  Surgeon: Rosetta Posner, MD;  Location: [facility];  Service: Vascular;  Laterality: Right;  . PATCH ANGIOPLASTY Right 12/25/2017   Procedure: PATCH ANGIOPLASTY RIGHT CAROTID ARTERY;  Surgeon: Rosetta Posner, MD;  Location: [facility] OR;  Service: Vascular;  Laterality: Right;        Home Medications    Prior to Admission medications   Medication Sig Start Date End Date Taking? Authorizing Provider  aspirin EC 81 MG tablet Take 81 mg by mouth daily.    [provider]  diclofenac (VOLTAREN) 75 MG EC tablet Take 75 mg by mouth 2 (two) times daily.     [provider]  gabapentin (NEURONTIN) 300 MG capsule Take 300 mg by mouth daily as needed for pain. 11/30/17   [provider]  metoprolol tartrate (LOPRESSOR) 50 MG tablet Take 50 mg by mouth 2 (two) times daily.    [provider]  nitroGLYCERIN (NITROSTAT) 0.4 MG SL tablet Place 0.4 mg under the tongue every 5 (five) minutes as needed for chest pain.     [provider]  prochlorperazine (COMPAZINE) 10 MG tablet Take 1 tablet (10 mg total) by mouth 2 (  two) times daily as needed (Headache). 10/27/17   Charlann Lange, PA-C  traMADol (ULTRAM) 50 MG tablet Take 1 tablet (50 mg total) by mouth every 6 (six) hours as needed for moderate pain. 12/26/17   Gabriel Earing, PA-C    Family History Family History  Problem Relation Age of Onset  . Other Father 63       deceased old age  . Coronary artery disease Mother 20       deceased  . Hypertension Mother   . Other Sister 64       living and healthy  . Coronary artery disease Brother   . Skin cancer Brother   . Cancer Other        family hx of   . Diabetes Other        family hx of  . Coronary artery disease Other        family hx of male < 28  . Arthritis Other        family hx of  . Asthma Other        family hx of    Social History Social History   Tobacco Use  . Smoking status: Former Smoker    Last attempt to quit: 05/23/1990    Years since quitting: 28.0  . Smokeless tobacco: Never Used  . Tobacco comment: smoked 2 packs per day for 20 years  Substance Use Topics  . Alcohol use: No  . Drug use: No     Allergies   Codeine and Oxycontin [oxycodone hcl]   Review of Systems Review of Systems  Unable to perform ROS: Acuity of condition     Physical Exam Updated Vital Signs BP (!) 140/58   Pulse 84   Temp (!) 97.2 F (36.2 C)   Resp 18   Ht 1.727 m (5\' 8" )   Wt 97.5 kg   SpO2 100%   BMI 32.69 kg/m   Physical Exam Vitals signs and nursing note reviewed.  Constitutional:      General: He is in acute distress.     Appearance: He is well-developed. He is ill-appearing. He is not diaphoretic.  HENT:     Head: Normocephalic and atraumatic.     Right Ear: External ear normal.     Left Ear: External ear normal.  Eyes:     General: No scleral icterus.       Right eye: No discharge.        Left eye: No discharge.     Conjunctiva/sclera: Conjunctivae normal.  Neck:     Musculoskeletal: Neck supple.     Trachea: No tracheal deviation.  Cardiovascular:     Rate and Rhythm: Regular rhythm. Tachycardia present.  Pulmonary:     Effort: Tachypnea present. No respiratory distress.     Breath sounds: Normal breath sounds. No stridor. No wheezing or rales.  Abdominal:     General: Bowel sounds are normal. There is no distension.     Palpations: Abdomen is soft.     Tenderness: There is no abdominal tenderness. There is no guarding or rebound.  Musculoskeletal:        General: No swelling, deformity or signs of injury.  Lymphadenopathy:     Cervical: No cervical adenopathy.  Skin:    General: Skin  is warm and dry.     Findings: No rash.  Neurological:     Mental Status: He is unresponsive.     GCS: GCS eye subscore is 2. GCS  verbal subscore is 1. GCS motor subscore is 5.     Cranial Nerves: No cranial nerve deficit (no facial droop, ).     Motor: No abnormal muscle tone or seizure activity.     Comments: Patient is constantly fighting against being restrained, he is actively moving all 4 extremities, trying to sit up in bed, patient will not speak or respond to any voice      ED Treatments / Results  Labs (all labs ordered are listed, but only abnormal results are displayed) Labs Reviewed  COMPREHENSIVE METABOLIC PANEL - Abnormal; Notable for the following components:      Result Value   CO2 16 (*)    Glucose, Bld 118 (*)    Creatinine, Ser 1.54 (*)    Total Protein 6.4 (*)    ALT 53 (*)    GFR calc non Af Amer 44 (*)    GFR calc Af Amer 51 (*)    Anion gap 17 (*)    All other components within normal limits  CBC WITH DIFFERENTIAL/PLATELET - Abnormal; Notable for the following components:   WBC 11.9 (*)    Lymphs Abs 4.4 (*)    Monocytes Absolute 1.2 (*)    Eosinophils Absolute 0.8 (*)    Abs Immature Granulocytes 0.09 (*)    All other components within normal limits  URINALYSIS, COMPLETE (UACMP) WITH MICROSCOPIC - Abnormal; Notable for the following components:   APPearance HAZY (*)    Hgb urine dipstick SMALL (*)    Protein, ur 100 (*)    All other components within normal limits  AMMONIA - Abnormal; Notable for the following components:   Ammonia 122 (*)    All other components within normal limits  BLOOD GAS, ARTERIAL - Abnormal; Notable for the following components:   pH, Arterial 7.340 (*)    pO2, Arterial 149 (*)    All other components within normal limits  ACETAMINOPHEN LEVEL - Abnormal; Notable for the following components:   Acetaminophen (Tylenol), Serum <10 (*)    All other components within normal limits  CBG MONITORING, ED - Abnormal; Notable for  the following components:   Glucose-Capillary 110 (*)    All other components within normal limits  ETHANOL  RAPID URINE DRUG SCREEN, HOSP PERFORMED  SALICYLATE LEVEL  PROTIME-INR    EKG EKG Interpretation  Date/Time:  Wednesday June 20 2018 09:18:54 EST Ventricular Rate:  136 PR Interval:    QRS Duration: 126 QT Interval:  357 QTC Calculation: 537 R Axis:   31 Text Interpretation:  Junctional tachycardia . mew somce ;ast tracing Nonspecific intraventricular conduction delay Anterior infarct, old Repol abnrm suggests ischemia, diffuse leads , new since last tracing Baseline wander in lead(s) II aVR Confirmed by Dorie Rank [id number]) on 06/20/2018 9:39:31 AM   Radiology Ct Head Wo Contrast  Result Date: 06/20/2018 CLINICAL DATA:  Cervical spine trauma.  Seizure EXAM: CT HEAD WITHOUT CONTRAST CT CERVICAL SPINE WITHOUT CONTRAST TECHNIQUE: Multidetector CT imaging of the head and cervical spine was performed following the standard protocol without intravenous contrast. Multiplanar CT image reconstructions of the cervical spine were also generated. COMPARISON:  CT head 10/26/2017 FINDINGS: CT HEAD FINDINGS Brain: Moderate atrophy. Mild chronic microvascular ischemic change. Chronic infarct right temporoparietal lobe. Negative for acute infarct, hemorrhage, or mass. Vascular: Atherosclerotic calcification. Negative for hyperdense vessel Skull: Negative for fracture Sinuses/Orbits: Negative Other: None CT CERVICAL SPINE FINDINGS Alignment: Normal Skull base and vertebrae: Negative for fracture Soft tissues and spinal canal: Endotracheal  tube and NG tubes in satisfactory position. No soft tissue mass or swelling Disc levels: Multilevel disc degeneration and spurring throughout the cervical spine. Upper chest: Mild atelectasis bilaterally. No significant pleural effusion Other: None IMPRESSION: 1. No acute intracranial abnormality. Chronic right temporoparietal infarct unchanged 2. Cervical  spondylosis without cervical fracture. Electronically Signed   By: Franchot Gallo M.D.   On: 06/20/2018 11:08   Ct Cervical Spine Wo Contrast  Result Date: 06/20/2018 CLINICAL DATA:  Cervical spine trauma.  Seizure EXAM: CT HEAD WITHOUT CONTRAST CT CERVICAL SPINE WITHOUT CONTRAST TECHNIQUE: Multidetector CT imaging of the head and cervical spine was performed following the standard protocol without intravenous contrast. Multiplanar CT image reconstructions of the cervical spine were also generated. COMPARISON:  CT head 10/26/2017 FINDINGS: CT HEAD FINDINGS Brain: Moderate atrophy. Mild chronic microvascular ischemic change. Chronic infarct right temporoparietal lobe. Negative for acute infarct, hemorrhage, or mass. Vascular: Atherosclerotic calcification. Negative for hyperdense vessel Skull: Negative for fracture Sinuses/Orbits: Negative Other: None CT CERVICAL SPINE FINDINGS Alignment: Normal Skull base and vertebrae: Negative for fracture Soft tissues and spinal canal: Endotracheal tube and NG tubes in satisfactory position. No soft tissue mass or swelling Disc levels: Multilevel disc degeneration and spurring throughout the cervical spine. Upper chest: Mild atelectasis bilaterally. No significant pleural effusion Other: None IMPRESSION: 1. No acute intracranial abnormality. Chronic right temporoparietal infarct unchanged 2. Cervical spondylosis without cervical fracture. Electronically Signed   By: Franchot Gallo M.D.   On: 06/20/2018 11:08   Dg Chest Portable 1 View  Result Date: 06/20/2018 CLINICAL DATA:  73 year old male with a history of fall and head injury EXAM: PORTABLE CHEST 1 VIEW COMPARISON:  09/14/2014 FINDINGS: Endotracheal tube terminates of less than 2 cm above the carina. Gastric tube projects over the mediastinum terminating out of the field of view. Low lung volumes accentuates the interstitium. Cardiomediastinal silhouette projects widened at the level of the vascular pedicle. There is  double density along the left heart border, not present on the comparison. Blunting at the left costophrenic angle. No pneumothorax. IMPRESSION: Endotracheal tube terminates less than 2 cm above the carina. This may be better positioned by withdrawing to the region of the clavicular heads, approximately 5-6 cm. Low lung volumes with likely atelectasis. Left basilar effusion/consolidation not excluded. The upper mediastinum appears widened compared to the prior plain film. This may be a consequence of the low lung volumes, however, acute mediastinal or vascular abnormality can not be excluded on the plain film. These results were called by telephone at the time of interpretation on 06/20/2018 at 10:07 am to Dr. Dorie Rank. Electronically Signed   By: Corrie Mckusick D.O.   On: 06/20/2018 10:07   Ct Angio Chest Aorta W And/or Wo Contrast  Result Date: 06/20/2018 CLINICAL DATA:  Chest pain.  Altered mental status.  Seizure. EXAM: CT ANGIOGRAPHY CHEST WITH CONTRAST TECHNIQUE: Multidetector CT imaging of the chest was performed using the standard protocol during bolus administration of intravenous contrast. Multiplanar CT image reconstructions and MIPs were obtained to evaluate the vascular anatomy. CONTRAST:  122mL ISOVUE-370 IOPAMIDOL (ISOVUE-370) INJECTION 76% COMPARISON:  Chest x-ray dated 06/20/2018 FINDINGS: Cardiovascular: Endotracheal tube 3.2 cm above the carina. NG tube tip in the fundus of the stomach. Aortic atherosclerosis. Heart size is normal. No pericardial effusion. No pulmonary emboli. Mediastinum/Nodes: No enlarged mediastinal, hilar, or axillary lymph nodes. Thyroid gland, trachea, and esophagus demonstrate no significant findings. Lungs/Pleura: There is slight atelectasis at both lung bases posterior medially. No effusions. Upper Abdomen:  No significant abnormalities. Musculoskeletal: No chest wall abnormality. No acute or significant osseous findings. Review of the MIP images confirms the above  findings. IMPRESSION: 1. No pulmonary emboli. 2. Bibasilar atelectasis. 3.  Aortic Atherosclerosis (ICD10-I70.0). Electronically Signed   By: Lorriane Shire M.D.   On: 06/20/2018 11:09    Procedures .Critical Care Performed by: Dorie Rank, MD Authorized by: Dorie Rank, MD   Critical care provider statement:    Critical care time (minutes):  45   Critical care was time spent personally by me on the following activities:  Discussions with consultants, evaluation of patient's response to treatment, examination of patient, ordering and performing treatments and interventions, ordering and review of laboratory studies, ordering and review of radiographic studies, pulse oximetry, re-evaluation of patient's condition, obtaining history from patient or surrogate and review of old charts Procedure Name: Intubation Date/Time: 06/20/2018 11:36 AM Performed by: Dorie Rank, MD Pre-anesthesia Checklist: Patient identified, Patient being monitored, Emergency Drugs available, Timeout performed and Suction available Oxygen Delivery Method: Non-rebreather mask Preoxygenation: Pre-oxygenation with 100% oxygen Induction Type: Rapid sequence Ventilation: Mask ventilation without difficulty Laryngoscope Size: Glidescope Grade View: Grade II Tube size: 7.5 mm Number of attempts: 1 Airway Equipment and Method: Video-laryngoscopy Placement Confirmation: ETT inserted through vocal cords under direct vision,  CO2 detector and Breath sounds checked- equal and bilateral Dental Injury: Teeth and Oropharynx as per pre-operative assessment     .Lumbar Puncture Date/Time: 06/20/2018 2:02 PM Performed by: Dorie Rank, MD Authorized by: Dorie Rank, MD   Consent:    Consent obtained:  Emergent situation Pre-procedure details:    Procedure purpose:  Diagnostic   Preparation: Patient was prepped and draped in usual sterile fashion   Anesthesia (see MAR for exact dosages):    Anesthesia method:  None Procedure  details:    Lumbar space:  L3-L4 interspace   Patient position:  L lateral decubitus   Needle gauge:  20   Needle type:  Spinal needle - Quincke tip   Ultrasound guidance: no     Number of attempts:  2   Fluid appearance:  Blood-tinged then clearing   Tubes of fluid:  4   Total volume (ml):  2.5 Post-procedure:    Puncture site:  Adhesive bandage applied   Patient tolerance of procedure:  Tolerated well, no immediate complications   (including critical care time)  Medications Ordered in ED Medications  sodium chloride 0.9 % bolus 500 mL (0 mLs Intravenous Stopped 06/20/18 1058)    And  0.9 %  sodium chloride infusion (125 mL/hr Intravenous New Bag/Given 06/20/18 1059)  fentaNYL 2531mcg in NS 239mL (4mcg/ml) infusion-PREMIX (25 mcg/hr Intravenous New Bag/Given 06/20/18 1104)  fentaNYL (SUBLIMAZE) bolus via infusion 25 mcg (has no administration in time range)  propofol (DIPRIVAN) 1000 MG/100ML infusion (40 mcg/kg/min  97.5 kg Intravenous Rate/Dose Change 06/20/18 1037)  levETIRAcetam (KEPPRA) 750 mg in sodium chloride 0.9 % 100 mL IVPB (has no administration in time range)  etomidate (AMIDATE) injection 20 mg (20 mg Intravenous Given 06/20/18 0923)  succinylcholine (ANECTINE) injection 100 mg (100 mg Intravenous Given 06/20/18 0923)  fentaNYL (SUBLIMAZE) injection 50 mcg (50 mcg Intravenous Given 06/20/18 1004)  fentaNYL (SUBLIMAZE) 100 MCG/2ML injection (100 mcg  Given 06/20/18 0940)  iopamidol (ISOVUE-370) 76 % injection 100 mL (100 mLs Intravenous Contrast Given 06/20/18 1045)     Initial Impression / Assessment and Plan / ED Course  I have reviewed the triage vital signs and the nursing notes.  Pertinent labs & imaging  results that were available during my care of the patient were reviewed by me and considered in my medical decision making (see chart for details).  Clinical Course as of Jun 20 1402  Wed Jun 20, 2018  1012 ET-tube noted to be too deep.  Will withdraw and repeat CXR    [JK]  1607 Discussed with Dr [place] Bells.  Plan on admission, transfer to [facility].   [PX]  1062 CT scan report reviewed.  ET tube seems to be in good placement at this time.  Will not withdraw further   [JK]  1139 Labs notable for anion gap acidosis.  This may be related to lactic acidosis from his seizure like activity and combativeness   [JK]  1141 Total Protein(!): 6.4 [JK]  1143 There are currently no beds available.  Will order an MRI while the patient is waiting for transfer.   [JK]  1403 LP completed.  Rocephin and decadron ordered   [JK]    Clinical Course User Index [JK] Dorie Rank, MD    Patient presented to the emergency room for evaluation of acute altered mental status with seizure-like activity.  In the ED the patient was very combative.  He was not cooperative.  The patient was intubated for airway protection and to facilitate his evaluation.  Patient's ED work-up does not show any signs of acute hemorrhage.  No obvious stroke or mass.  CT angios chest was performed because of the widened mediastinum on his portable chest x-ray but there is no evidence of any acute vascular abnormality.  Code stroke was not called initially because of the patient's seizure-like activity.  He did not appear to have any focal deficits as he was resisting [place] as we tried to sedate him.  It has remained stable on the ventilator.  He is being sedated and is calm.  Updated the family members.  Patient will need to be transferred to [facility] in order to be evaluated by neurology, possibly obtain an EEG and MRI.  I have consulted with the critical care service at [facility] and plan for admission and transfer.  Final Clinical Impressions(s) / ED Diagnoses   Final diagnoses:  Seizure ([place])  Altered mental status, unspecified altered mental status type  Hyperammonemia ([place])       Dorie Rank, MD 06/20/18 1143 LP procedure   Dorie Rank, MD 06/20/18 1404

## 2018-06-20 NOTE — Progress Notes (Signed)
EEG completed; results pending.    

## 2018-06-20 NOTE — ED Notes (Signed)
ET tube moved back to 22 at lip

## 2018-06-21 ENCOUNTER — Ambulatory Visit (HOSPITAL_COMMUNITY): Payer: PPO

## 2018-06-21 ENCOUNTER — Inpatient Hospital Stay (HOSPITAL_COMMUNITY): Payer: PPO

## 2018-06-21 ENCOUNTER — Other Ambulatory Visit (HOSPITAL_COMMUNITY): Payer: PPO

## 2018-06-21 LAB — POCT I-STAT 7, (LYTES, BLD GAS, ICA,H+H)
Acid-base deficit: 5 mmol/L — ABNORMAL HIGH (ref 0.0–2.0)
Acid-base deficit: 3 mmol/L — ABNORMAL HIGH (ref 0.0–2.0)
Acid-base deficit: 4 mmol/L — ABNORMAL HIGH (ref 0.0–2.0)
Bicarbonate: 25.1 mmol/L (ref 20.0–28.0)
Bicarbonate: 17.8 mmol/L — ABNORMAL LOW (ref 20.0–28.0)
Bicarbonate: 18.2 mmol/L — ABNORMAL LOW (ref 20.0–28.0)
Calcium, Ion: 1.23 mmol/L (ref 1.15–1.40)
Calcium, Ion: 1.11 mmol/L — ABNORMAL LOW (ref 1.15–1.40)
Calcium, Ion: 1.14 mmol/L — ABNORMAL LOW (ref 1.15–1.40)
HCT: 37 % — ABNORMAL LOW (ref 39.0–52.0)
HCT: 35 % — ABNORMAL LOW (ref 39.0–52.0)
HCT: 34 % — ABNORMAL LOW (ref 39.0–52.0)
HEMOGLOBIN: 11.6 g/dL — AB (ref 13.0–17.0)
Hemoglobin: 12.6 g/dL — ABNORMAL LOW (ref 13.0–17.0)
Hemoglobin: 11.9 g/dL — ABNORMAL LOW (ref 13.0–17.0)
O2 Saturation: 98 %
O2 Saturation: 99 %
O2 Saturation: 98 %
Patient temperature: 97.3
Patient temperature: 96.8
Potassium: 4.7 mmol/L (ref 3.5–5.1)
Potassium: 4.3 mmol/L (ref 3.5–5.1)
Potassium: 3.9 mmol/L (ref 3.5–5.1)
SODIUM: 140 mmol/L (ref 135–145)
Sodium: 139 mmol/L (ref 135–145)
Sodium: 138 mmol/L (ref 135–145)
TCO2: 27 mmol/L (ref 22–32)
TCO2: 19 mmol/L — ABNORMAL LOW (ref 22–32)
TCO2: 19 mmol/L — ABNORMAL LOW (ref 22–32)
pCO2 arterial: 71.6 mmHg (ref 32.0–48.0)
pCO2 arterial: 21.4 mmHg — ABNORMAL LOW (ref 32.0–48.0)
pCO2 arterial: 23.3 mmHg — ABNORMAL LOW (ref 32.0–48.0)
pH, Arterial: 7.152 — CL (ref 7.350–7.450)
pH, Arterial: 7.527 — ABNORMAL HIGH (ref 7.350–7.450)
pH, Arterial: 7.497 — ABNORMAL HIGH (ref 7.350–7.450)
pO2, Arterial: 130 mmHg — ABNORMAL HIGH (ref 83.0–108.0)
pO2, Arterial: 122 mmHg — ABNORMAL HIGH (ref 83.0–108.0)
pO2, Arterial: 83 mmHg (ref 83.0–108.0)

## 2018-06-21 LAB — TROPONIN I
Troponin I: 2.23 ng/mL (ref <0.03)
Troponin I: 1.82 ng/mL (ref <0.03)
Troponin I: 1.37 ng/mL (ref <0.03)

## 2018-06-21 LAB — CBC
HCT: 41.9 % (ref 39.0–52.0)
Hemoglobin: 13.6 g/dL (ref 13.0–17.0)
MCH: 30.2 pg (ref 26.0–34.0)
MCHC: 32.5 g/dL (ref 30.0–36.0)
MCV: 92.9 fL (ref 80.0–100.0)
PLATELETS: 194 10*3/uL (ref 150–400)
RBC: 4.51 MIL/uL (ref 4.22–5.81)
RDW: 12.8 % (ref 11.5–15.5)
WBC: 11.4 10*3/uL — ABNORMAL HIGH (ref 4.0–10.5)
nRBC: 0 % (ref 0.0–0.2)

## 2018-06-21 LAB — BASIC METABOLIC PANEL
Anion gap: 10 (ref 5–15)
BUN: 19 mg/dL (ref 8–23)
CO2: 24 mmol/L (ref 22–32)
Calcium: 8.4 mg/dL — ABNORMAL LOW (ref 8.9–10.3)
Chloride: 105 mmol/L (ref 98–111)
Creatinine, Ser: 1.46 mg/dL — ABNORMAL HIGH (ref 0.61–1.24)
GFR calc Af Amer: 55 mL/min — ABNORMAL LOW (ref >60)
GFR calc non Af Amer: 47 mL/min — ABNORMAL LOW (ref >60)
GLUCOSE: 128 mg/dL — AB (ref 70–99)
Potassium: 4.3 mmol/L (ref 3.5–5.1)
SODIUM: 139 mmol/L (ref 135–145)

## 2018-06-21 LAB — AMMONIA: Ammonia: 20 umol/L (ref 9–35)

## 2018-06-21 LAB — PHOSPHORUS: Phosphorus: 4.7 mg/dL — ABNORMAL HIGH (ref 2.5–4.6)

## 2018-06-21 LAB — GLUCOSE, CAPILLARY: Glucose-Capillary: 89 mg/dL (ref 70–99)

## 2018-06-21 LAB — MAGNESIUM: Magnesium: 2.2 mg/dL (ref 1.7–2.4)

## 2018-06-21 MED ORDER — STROKE: EARLY STAGES OF RECOVERY BOOK
Freq: Once | Status: AC
  Administered 2018-06-21: 13:00:00
  Filled 2018-06-21: qty 1

## 2018-06-21 MED ORDER — HEPARIN SODIUM (PORCINE) 5000 UNIT/ML IJ SOLN
5000.0000 [IU] | Freq: Three times a day (TID) | INTRAMUSCULAR | Status: DC
  Administered 2018-06-21 – 2018-06-30 (×27): 5000 [IU] via SUBCUTANEOUS
  Filled 2018-06-21 (×27): qty 1

## 2018-06-21 MED ORDER — ASPIRIN 81 MG PO CHEW
81.0000 mg | CHEWABLE_TABLET | Freq: Every day | ORAL | Status: DC
  Administered 2018-06-21 – 2018-06-30 (×10): 81 mg
  Filled 2018-06-21 (×10): qty 1

## 2018-06-21 MED ORDER — SODIUM CHLORIDE 0.9 % NICU IV INFUSION SIMPLE: 500.0000 mL | INJECTION | Freq: Once | INTRAVENOUS | Status: DC

## 2018-06-21 MED ORDER — MIDAZOLAM 50MG/50ML (1MG/ML) PREMIX INFUSION
5.0000 mg/h | INTRAVENOUS | Status: DC
  Administered 2018-06-21 – 2018-06-22 (×3): 5 mg/h via INTRAVENOUS
  Filled 2018-06-21 (×3): qty 50

## 2018-06-21 MED ORDER — MIDAZOLAM HCL 2 MG/2ML IJ SOLN
INTRAMUSCULAR | Status: AC
  Administered 2018-06-21: 5 mg via INTRAVENOUS
  Filled 2018-06-21: qty 6

## 2018-06-21 MED ORDER — LORAZEPAM 2 MG/ML IJ SOLN
INTRAMUSCULAR | Status: AC
  Administered 2018-06-21: 2 mg via INTRAVENOUS
  Filled 2018-06-21: qty 1

## 2018-06-21 MED ORDER — SODIUM CHLORIDE 0.9 % NICU IV INFUSION SIMPLE
500.0000 mL | INJECTION | Freq: Once | INTRAVENOUS | Status: DC
  Filled 2018-06-21: qty 500

## 2018-06-21 MED ORDER — LORAZEPAM 2 MG/ML IJ SOLN
2.0000 mg | Freq: Once | INTRAMUSCULAR | Status: AC
  Administered 2018-06-21: 2 mg via INTRAVENOUS

## 2018-06-21 MED ORDER — THIAMINE HCL 100 MG/ML IJ SOLN
500.0000 mg | Freq: Three times a day (TID) | INTRAVENOUS | Status: AC
  Administered 2018-06-21 – 2018-06-24 (×9): 500 mg via INTRAVENOUS
  Filled 2018-06-21 (×11): qty 5

## 2018-06-21 MED ORDER — FAMOTIDINE 40 MG/5ML PO SUSR
20.0000 mg | Freq: Two times a day (BID) | ORAL | Status: DC
  Administered 2018-06-21 – 2018-06-26 (×8): 20 mg
  Filled 2018-06-21 (×9): qty 2.5

## 2018-06-21 MED ORDER — MIDAZOLAM HCL (PF) 5 MG/ML IJ SOLN: 5.0000 mg | INTRAMUSCULAR | Status: DC | PRN

## 2018-06-21 MED ORDER — ROSUVASTATIN CALCIUM 5 MG PO TABS
10.0000 mg | ORAL_TABLET | Freq: Every day | ORAL | Status: DC
  Administered 2018-06-22 – 2018-06-26 (×4): 10 mg
  Filled 2018-06-21 (×4): qty 2

## 2018-06-21 MED ORDER — SODIUM CHLORIDE 0.9 % IV BOLUS
500.0000 mL | Freq: Once | INTRAVENOUS | Status: AC
  Administered 2018-06-21: 500 mL via INTRAVENOUS

## 2018-06-21 MED ORDER — PHENYLEPHRINE HCL-NACL 10-0.9 MG/250ML-% IV SOLN
0.0000 ug/min | INTRAVENOUS | Status: DC
  Administered 2018-06-21: 20 ug/min via INTRAVENOUS
  Administered 2018-06-22: 30 ug/min via INTRAVENOUS
  Administered 2018-06-22: 10 ug/min via INTRAVENOUS
  Filled 2018-06-21 (×3): qty 250

## 2018-06-21 MED ORDER — LEVETIRACETAM 100 MG/ML PO SOLN
500.0000 mg | Freq: Two times a day (BID) | ORAL | Status: DC
  Administered 2018-06-21 – 2018-06-24 (×8): 500 mg
  Filled 2018-06-21 (×8): qty 5

## 2018-06-21 MED ORDER — SODIUM CHLORIDE 0.9 % IV SOLN
125.0000 mg | Freq: Three times a day (TID) | INTRAVENOUS | Status: DC
  Administered 2018-06-22 – 2018-06-27 (×15): 125 mg via INTRAVENOUS
  Filled 2018-06-21 (×23): qty 2.5

## 2018-06-21 MED ORDER — MIDAZOLAM BOLUS VIA INFUSION
5.0000 mg | INTRAVENOUS | Status: DC | PRN
  Filled 2018-06-21: qty 5

## 2018-06-21 MED ORDER — SODIUM CHLORIDE 0.9 % IV SOLN
1500.0000 mg | Freq: Once | INTRAVENOUS | Status: AC
  Administered 2018-06-21: 1500 mg via INTRAVENOUS
  Filled 2018-06-21: qty 30

## 2018-06-21 MED ORDER — PHENYLEPHRINE HCL-NACL 10-0.9 MG/250ML-% IV SOLN
INTRAVENOUS | Status: AC
  Filled 2018-06-21: qty 250

## 2018-06-21 MED ORDER — SODIUM CHLORIDE 0.9% FLUSH: 10.0000 mL | INTRAVENOUS | Status: DC | PRN

## 2018-06-21 NOTE — Progress Notes (Signed)
EEG Tech notified to hook up LTM EEG

## 2018-06-21 NOTE — Progress Notes (Signed)
EEG completed, results pending. 

## 2018-06-21 NOTE — Progress Notes (Signed)
Stat LTM initiated, push button tested.  Nursing staff educated regarding LTM.

## 2018-06-21 NOTE — Progress Notes (Addendum)
Neurology Progress Note   S:// Seen and examined.  Continues to be very agitated when sedation is reduced. Moving all 4 extremities and nonfocal exam half of sedation per nursing.  MRI of the brain shows subcentimeter left occipital areas of restricted diffusion. Stable areas of right parietal encephalomalacia.  I spoke with the family and explained the results but they said they have no idea that he has had a right parietal infarct in the past.   O:// Current vital signs: BP 120/85   Pulse 61   Temp 98.8 F (37.1 C)   Resp 14   Ht _0  (1.727 m)   Wt 97 kg   SpO2 96%   BMI 32.52 kg/m  Vital signs in last 24 hours: Temp:  [97 F (36.1 C)-98.8 F (37.1 C)] 98.8 F (37.1 C) (01/30 1100) Pulse Rate:  [54-96] 61 (01/30 1212) Resp:  [9-20] 14 (01/30 1212) BP: (98-174)/(52-108) 120/85 (01/30 1100) SpO2:  [84 %-100 %] 96 % (01/30 1213) FiO2 (%):  [40 %-100 %] 40 % (01/30 1213) Weight:  [97 kg] 97 kg (01/30 0500) General: Sedated intubated.  Propofol held for exam HEENT: Some petechial-looking rashes all over the face and head, atraumatic, ET tube in place. CVS: S1-S2 heard, regular rate and rhythm Respiratory: Chest clear to auscultation Abdomen: Nondistended nontender Neurological exam Patient sedated intubated.  Sedation was held for exam. Opens eyes to voice.  Does not follow commands. On sternal rub localizes briskly from both extremities. Lower extremities- briskly withdraws to noxious stimulation. Pupils are equal round reactive light, no gaze preference or forced gaze deviation, oculocephalics intact, corneals intact, gag intact. Breathing over the ventilator.   Medications  Current Facility-Administered Medications:  .  [COMPLETED] sodium chloride 0.9 % bolus 500 mL, 500 mL, Intravenous, Once, Stopped at 06/20/18 1058 **AND** 0.9 %  sodium chloride infusion, , Intravenous, Continuous, Dorie Rank, MD, Last Rate: 125 mL/hr at 06/21/18 1200 .  acetaminophen  (TYLENOL) tablet 650 mg, 650 mg, Oral, Q4H PRN, Simonne Maffucci B, MD .  aspirin chewable tablet 81 mg, 81 mg, Per Tube, Daily, Desai, Rahul P, PA-C .  chlorhexidine gluconate (MEDLINE KIT) (PERIDEX) 0.12 % solution 15 mL, 15 mL, Mouth Rinse, BID, McQuaid, Douglas B, MD, 15 mL at 06/21/18 0814 .  famotidine (PEPCID) 40 MG/5ML suspension 20 mg, 20 mg, Per Tube, BID, McQuaid, Douglas B, MD .  fentaNYL (SUBLIMAZE) bolus via infusion 25 mcg, 25 mcg, Intravenous, Q1H PRN, Dorie Rank, MD .  fentaNYL (SUBLIMAZE) injection 100 mcg, 100 mcg, Intravenous, Q15 min PRN, Simonne Maffucci B, MD .  fentaNYL (SUBLIMAZE) injection 100 mcg, 100 mcg, Intravenous, Q2H PRN, McQuaid, Douglas B, MD .  fentaNYL 2542mg in NS 2593m(1049mml) infusion-PREMIX, 25-400 mcg/hr, Intravenous, Continuous, KnaDorie RankD, Last Rate: 7.5 mL/hr at 06/21/18 1200, 75 mcg/hr at 06/21/18 1200 .  heparin injection 5,000 Units, 5,000 Units, Subcutaneous, Q8H, Desai, Rahul P, PA-C .  levETIRAcetam (KEPPRA) 100 MG/ML solution 500 mg, 500 mg, Per Tube, BID, McQSimonne Maffucci MD, 500 mg at 06/21/18 0934 .  MEDLINE mouth rinse, 15 mL, Mouth Rinse, 10 times per day, McQSimonne Maffucci MD, 15 mL at 06/21/18 0934 .  midazolam (VERSED) injection 2 mg, 2 mg, Intravenous, Q15 min PRN, McQSimonne Maffucci MD, 2 mg at 06/21/18 1100 .  midazolam (VERSED) injection 2 mg, 2 mg, Intravenous, Q2H PRN, McQuaid, Douglas B, MD .  ondansetron (ZOFRAN) injection 4 mg, 4 mg, Intravenous, Q6H PRN, McQJuanito DoomD .  propofol (DIPRIVAN) 1000 MG/100ML infusion, 0-50 mcg/kg/min, Intravenous, Continuous, McQuaid, Douglas B, MD, Last Rate: 17.55 mL/hr at 06/21/18 1200, 30 mcg/kg/min at 06/21/18 1200 .  rosuvastatin (CRESTOR) tablet 10 mg, 10 mg, Per Tube, q1800, Desai, Rahul P, PA-C .  sodium chloride flush (NS) 0.9 % injection 10-40 mL, 10-40 mL, Intracatheter, PRN, Juanito Doom, MD Labs CBC    Component Value Date/Time   WBC 11.4 (H) 06/21/2018  0317   RBC 4.51 06/21/2018 0317   HGB 13.6 06/21/2018 0317   HCT 41.9 06/21/2018 0317   PLT 194 06/21/2018 0317   MCV 92.9 06/21/2018 0317   MCH 30.2 06/21/2018 0317   MCHC 32.5 06/21/2018 0317   RDW 12.8 06/21/2018 0317   LYMPHSABS 4.4 (H) 06/20/2018 0928   MONOABS 1.2 (H) 06/20/2018 0928   EOSABS 0.8 (H) 06/20/2018 0928   BASOSABS 0.1 06/20/2018 0928    CMP     Component Value Date/Time   NA 139 06/21/2018 0317   K 4.3 06/21/2018 0317   CL 105 06/21/2018 0317   CO2 24 06/21/2018 0317   GLUCOSE 128 (H) 06/21/2018 0317   BUN 19 06/21/2018 0317   CREATININE 1.46 (H) 06/21/2018 0317   CALCIUM 8.4 (L) 06/21/2018 0317   PROT 5.8 (L) 06/20/2018 1844   ALBUMIN 3.3 (L) 06/20/2018 1844   AST 45 (H) 06/20/2018 1844   ALT 55 (H) 06/20/2018 1844   ALKPHOS 40 06/20/2018 1844   BILITOT 0.8 06/20/2018 1844   GFRNONAA 47 (L) 06/21/2018 0317   GFRAA 55 (L) 06/21/2018 0317    Imaging I have reviewed images in epic and the results pertinent to this consultation are: MRI brain shows 2 punctate areas of restricted diffusion in the left PCA territory.  There is also evidence of old stroke in the right parietal lobe. Has a history of right carotid endarterectomy last year.  It was done for asymptomatic right carotid stenosis and he never complained of stroke symptoms before.  Assessment:  73 year old man with evidence of an old right MCA territory stroke on MRI but family does not know if he had a stroke as he never complained of any strokelike symptoms, with new onset seizure. Likely seizures emanating from the area of encephalomalacia. Most recent MRI done today shows a punctate area of restricted diffusion in the left PCA territory.  The new subcentimeter punctate stroke seen on the left PCA territory cannot explain his current presentation or seizure activity and I think are purely incidental.  That said, having evidence of strokes in 2 different circulations on 2 different sizes the  brain does raise a possibility of possible underlying cardioembolic source which should be investigated.  Impression: New onset seizure, possible status epilepticus-resolved Acute ischemic stroke-likely cardioembolic  Recommendations: CTA head and neck Keppra 500 twice daily Repeat routine EEG Aspirin and statin 2D echocardiogram Frequent neurochecks Try to minimize sedation and reduce sedation as tolerated. Check thiamine levels Replace thiamine-500 mg 3 times daily IV for 3 days.  There is no history of alcohol abuse or dietary insufficiency but will see if thiamine supplementation helps.  Extubation per primary team  Family was not aware that he had right parietal stroke.  I showed them the MRI and CT images and explained the findings.  I also discussed the plan in detail with them.  We will continue to follow with you.  -- Amie Portland, MD [facility] Pager: [phone] If 7pm to 7am, please call on call as listed on AMION.  CRITICAL  CARE ATTESTATION Performed by: Amie Portland, MD Total critical care time: 20mnutes Critical care time was exclusive of separately billable procedures and treating other patients and/or supervising APPs/Residents/Students Critical care was necessary to treat or prevent imminent or life-threatening deterioration due to seizures, status epilepticus, acute ischemic stroke. This patient is critically ill and at significant risk for neurological worsening and/or death and care requires constant monitoring. Critical care was time spent personally by me on the following activities: development of treatment plan with patient and/or surrogate as well as nursing, discussions with consultants, evaluation of patient's response to treatment, examination of patient, obtaining history from patient or surrogate, ordering and performing treatments and interventions, ordering and review of laboratory studies, ordering and review of radiographic studies,  pulse oximetry, re-evaluation of patient's condition, participation in multidisciplinary rounds and medical decision making of high complexity in the care of this patient.

## 2018-06-21 NOTE — Progress Notes (Signed)
Per Neuro MD Rory Percy sys bp goal >120.  Was given neo orders to achieve goal.

## 2018-06-21 NOTE — Progress Notes (Signed)
Pharmacy Consult - Phenytoin Dosing and Monitoring  43 yom presenting with CVA, new-onset seizures. Patient was started on Keppra initially and seizure activity resolved. Now with concern for possible return of seizure activity - patient given ativan, Fosphenytoin 1500mg  IV load, increased propofol drip, and Versed drip ordered per Neuro. Pharmacy consulted to dose and monitor phenytoin. Patient is not on medication for seizures PTA. SCr trend up 1.25>>1.46, LFTs mildly elevated, albumin low at 3.3.  Plan: Phenytoin level 24 hours after completion of Fosphenytoin loading dose to ensure appropriate load Continue phenytoin 125mg  IV q8h (~5mg /kg/day using adjusted body weight) to start 12 hours after Fosphenytoin load Phenytoin level in 5-7 days. True steady state not reached for 10-14 days  Elicia Lamp, PharmD, BCPS Please check AMION for all [facility] contact numbers Clinical Pharmacist 06/21/2018 6:08 PM

## 2018-06-21 NOTE — Progress Notes (Signed)
Event note  Called by the primary team and RN for asynchronous breathing during which she takes a deep breath and almost has a breath-holding spell and then relaxes his belly to release the breath. Concern for this being a neurological event. Chest x-ray pending at this time. Given 2 mg of Ativan with minimal change in symptoms. Ordered fosphenytoin 20 mg/kg-1500 maximum dose. Then ordered Versed 5 mg IV x1 followed by 5 mg Versed drip. Also increased propofol to 50 mics per kilogram per minute  We will sign out to the oncoming neuro hospitalist.  If he continues to have these events and if there is any concern for seizure to be, might want to call the EEG technologist overnight took him up to L[facility] EEG.  Neurology will continue to follow.   Critical care time 30 minutes.  -- Amie Portland, MD [facility] Pager: [phone] If 7pm to 7am, please call on call as listed on AMION.

## 2018-06-21 NOTE — Progress Notes (Signed)
NAME:  Jeremy [name], MRN:  [id number], DOB:  06/04/1945, LOS: 1 ADMISSION DATE:  06/20/2018, CONSULTATION DATE: June 20, 2018 REFERRING MD: Dr. Tomi Bamberger , CHIEF COMPLAINT: Acute loss of consciousness  Brief History   73 year old male with a past medical history significant for hypertension, cerebrovascular disease was in his usual state of health on June 20, 2018 when he suddenly had a seizure-like episode at [employer].  Was intubated for airway protection in the setting of severe combativeness.  History of present illness   This is a pleasant 73 year old male who came to our facility on June 20, 2018 in the setting of seizure-like episode, acute loss of consciousness followed by severe combativeness.  His family provides history because he was intubated on my arrival.  They state that he has been in his usual state of health.  Apparently had carotid artery surgery several months ago.  At that time he was noted to have some degree of cerebral artery stenosis.  He was awaiting neurology consultation but that had not happened yet.  In the interim since September when he last had his surgery he was complaining of significant headache on a regular basis.  They said that this has been happening off and on.  They denied any sort of alcohol intake, he does not use drugs, he does not smoke cigarettes.  They denied a history of diabetes.  At the [facility] emergency room he required intubation for airway protection.  He had no seizure activity there.  He was noted to have an elevated ammonia.  Past Medical History  Hypertension, coronary artery disease, hypercholesterolemia, gastroesophageal reflux disease, carotid artery stenosis,  Significant Hospital Events   June 20, 2018 admission  Consults:  Neurology  Procedures:  Lumbar puncture January 29: No pleocytosis  Significant Diagnostic Tests:  CT head June 20, 2018 showed no acute intracranial abnormality, chronic right  temporoparietal infarct which had been unchanged, cervical spondylosis CT angiogram chest June 20, 2018 no pulmonary embolism, bibasilar atelectasis, some aortic atherosclerosis noted MRI brain 1/29 > acute / early subacute infarction in left occipital lobe.  No hemorrhage or mass effect. RUQ [place] 1/30 > hepatic steatosis. EEG 1/30 > generalized encephalopathy. Echo 1/30 >   Micro Data:  CSF culture 1/29 >   Antimicrobials:    Interim history/subjective:  Opens eyes but does not follow commands. Becomes agitated once sedation is weaned.  Objective   Blood pressure (!) 153/98, pulse 89, temperature 97.7 F (36.5 C), temperature source Bladder, resp. rate 14, height 5\' 8"  (1.727 m), weight 97 kg, SpO2 92 %.    Vent Mode: PRVC FiO2 (%):  [40 %-100 %] 40 % Set Rate:  [14 bmp-18 bmp] 14 bmp Vt Set:  [550 mL] 550 mL PEEP:  [5 cmH20] 5 cmH20 Plateau Pressure:  [17 cmH20-25 cmH20] 20 cmH20   Intake/Output Summary (Last 24 hours) at 06/21/2018 1035 Last data filed at 06/21/2018 0900 Gross per 24 hour  Intake 3480.77 ml  Output 785 ml  Net 2695.77 ml   Filed Weights   06/20/18 0931 06/20/18 1103 06/21/18 0500  Weight: 97.5 kg 97.5 kg 97 kg    Examination: General:  Adult male, on vent, somewhat agitated but in NAD HENT: [place] / AT.  ETT in place PULM: CTA B, vent supported breathing CV: RRR, no mgr GI: BS+, soft, nontender MSK: normal bulk and tone Neuro: Sedated but remains agitated.  MAE.   Assessment & Plan:   Acute respiratory failure with hypoxemia  due to inability to protect airway: ABG within normal limits, chest x-ray without evidence of acute pulmonary process. - Continue ventilator support with daily SBT's. - If tolerates SBT today, can consider extubation. - Ventilator associated pneumonia prevention protocol.  Acute onset seizure activity - presumed due to left occipital early infarct. - Appreciate neurology consult. - Continue Keppra.  Left occipital  CVA. - Neuro following. - Stroke workup per neuro.  Acute on mild CKD. - Continue supportive care. - Monitor BMP.  Troponin bump - ? Etiology. - Repeat now to ensure downtrend. - Repeat EKG now. - Repeat echo especially in light of new CVA.  Hx HTN, HLD, CAD. - Continue preadmission ASA, rosuvastatin. - Hold preadmission enalapril, metoprolol, losartan.  Sedation needs in setting of mechanical ventilation. - Continue propofol gtt / Fentanyl PRN. - RASS goal 0 to -1. - Daily WUA.   Best practice:  Diet: N.p.o. Pain/Anxiety/Delirium protocol (if indicated): Yes, as above VAP protocol (if indicated): Yes DVT prophylaxis: SCD / heparin. GI prophylaxis: Famotidine Glucose control: None, SSI if glucose consistently > 180 Mobility: Bedrest Code Status: Full Family Communication: Son, daughter in law, and fiance updated at bedside 1/30. Disposition: ICU   Critical care time: 30 min.    Montey Hora, [facility] Pager: [phone].  If no answer, [phone] 06/21/2018, 10:49 AM

## 2018-06-21 NOTE — Progress Notes (Addendum)
PCCM progress notes  Called to bedside for dyssynchrony with the vent Patient is having periods of long inspiration of 4-5 seconds before expiration ABG shows acute CO2 retention and acidosis. X-ray shows low lung volumes, atelectasis with no significant abnormality  Suspect that his pattern of breathing may be neurologic Discussed with Dr. Rory Percy, neurology. We have loaded with Fosphenytoin  added a second antiepileptic with Dilantin and started Versed drip EEG tonight Vent changed to pressure control Increased respiratory rate from 10 to18 Repeat ABG  If he continues to retain CO2 with vent dyssynchrony then consider paralysis  Marshell Garfinkel MD [facility] 06/21/2018, 7:07 PM

## 2018-06-21 NOTE — Procedures (Signed)
ELECTROENCEPHALOGRAM REPORT   Patient: Jeremy [name]       Room #: 0Y77A EEG No. ID: [id number] Age: 73 y.o.        Sex: male Referring Physician: Lake Bells Report Date:  06/21/2018        Interpreting Physician: Alexis Goodell  History: Jeremy [name] is an 73 y.o. male with new onset seizure  Medications:  Fentanyl, Diprovan, Crestor, Keppra, Pepcid, ASA  Conditions of Recording:  This is a 21 channel routine scalp EEG performed with bipolar and monopolar montages arranged in accordance to the international 10/20 system of electrode placement. One channel was dedicated to EKG recording.  The patient is in the intubated and sedated state.  Description:  The background activity is slow and poorly organized.  It consists of a polymorphic delta rhythm this is continuous and diffusely distributed.  There is some fairly rhythmical beta activity that is noted superimposed at times.  This activity is symmetrical and resembles sleep spindles.   No epileptiform activity is noted.   Hyperventilation and intermittent photic stimulation were not performed.   IMPRESSION: This is an abnormal EEG secondary to general background slowing.  This finding may be seen with a diffuse disturbance that is etiologically nonspecific, but may include a metabolic encephalopathy or medication effect, among other possibilities.  No epileptiform activity was noted.     Alexis Goodell, MD Neurology [phone] 06/21/2018, 3:47 PM

## 2018-06-21 NOTE — Progress Notes (Signed)
RT NOTE: Patient extremely desynchronous with ventilator. CCM consulted and came to room. Vent changes made as charted. ABG to be drawn in 30 minutes. Vitals are stable. RT will continue to monitor.

## 2018-06-22 ENCOUNTER — Inpatient Hospital Stay (HOSPITAL_COMMUNITY): Payer: PPO

## 2018-06-22 DIAGNOSIS — I361 Nonrheumatic tricuspid (valve) insufficiency: Secondary | ICD-10-CM

## 2018-06-22 DIAGNOSIS — I340 Nonrheumatic mitral (valve) insufficiency: Secondary | ICD-10-CM

## 2018-06-22 LAB — ECHOCARDIOGRAM COMPLETE
Height: 68 in
Weight: 3763.69 oz

## 2018-06-22 LAB — HSV DNA BY PCR (REFERENCE LAB): HSV 2 DNA: NEGATIVE

## 2018-06-22 LAB — BASIC METABOLIC PANEL
Anion gap: 8 (ref 5–15)
BUN: 19 mg/dL (ref 8–23)
CO2: 18 mmol/L — ABNORMAL LOW (ref 22–32)
CREATININE: 1.24 mg/dL (ref 0.61–1.24)
Calcium: 8 mg/dL — ABNORMAL LOW (ref 8.9–10.3)
Chloride: 117 mmol/L — ABNORMAL HIGH (ref 98–111)
GFR calc Af Amer: >60 mL/min (ref >60)
GFR calc non Af Amer: 58 mL/min — ABNORMAL LOW (ref >60)
Glucose, Bld: 97 mg/dL (ref 70–99)
Potassium: 4.1 mmol/L (ref 3.5–5.1)
Sodium: 143 mmol/L (ref 135–145)

## 2018-06-22 LAB — PHENYTOIN LEVEL, TOTAL: Phenytoin Lvl: 11.2 ug/mL (ref 10.0–20.0)

## 2018-06-22 LAB — TSH: TSH: 0.634 u[IU]/mL (ref 0.350–4.500)

## 2018-06-22 LAB — POCT I-STAT 7, (LYTES, BLD GAS, ICA,H+H)
ACID-BASE DEFICIT: 4 mmol/L — AB (ref 0.0–2.0)
Bicarbonate: 19.8 mmol/L — ABNORMAL LOW (ref 20.0–28.0)
Calcium, Ion: 1.19 mmol/L (ref 1.15–1.40)
HCT: 33 % — ABNORMAL LOW (ref 39.0–52.0)
Hemoglobin: 11.2 g/dL — ABNORMAL LOW (ref 13.0–17.0)
O2 Saturation: 99 %
Patient temperature: 97.6
Potassium: 3.8 mmol/L (ref 3.5–5.1)
Sodium: 143 mmol/L (ref 135–145)
TCO2: 21 mmol/L — ABNORMAL LOW (ref 22–32)
pCO2 arterial: 31.5 mmHg — ABNORMAL LOW (ref 32.0–48.0)
pH, Arterial: 7.403 (ref 7.350–7.450)
pO2, Arterial: 117 mmHg — ABNORMAL HIGH (ref 83.0–108.0)

## 2018-06-22 LAB — PHOSPHORUS: Phosphorus: 2.2 mg/dL — ABNORMAL LOW (ref 2.5–4.6)

## 2018-06-22 LAB — GLUCOSE, CAPILLARY
GLUCOSE-CAPILLARY: 79 mg/dL (ref 70–99)
GLUCOSE-CAPILLARY: 86 mg/dL (ref 70–99)
Glucose-Capillary: 92 mg/dL (ref 70–99)
Glucose-Capillary: 82 mg/dL (ref 70–99)
Glucose-Capillary: 75 mg/dL (ref 70–99)
Glucose-Capillary: 113 mg/dL — ABNORMAL HIGH (ref 70–99)

## 2018-06-22 LAB — CORTISOL: Cortisol, Plasma: 0.4 ug/dL

## 2018-06-22 LAB — T4, FREE: FREE T4: 0.97 ng/dL (ref 0.82–1.77)

## 2018-06-22 LAB — HERPES SIMPLEX VIRUS(HSV) DNA BY PCR: HSV 1 DNA: NEGATIVE

## 2018-06-22 MED ORDER — HYDROCORTISONE NA SUCCINATE PF 100 MG IJ SOLR
100.0000 mg | INTRAMUSCULAR | Status: AC
  Administered 2018-06-22: 100 mg via INTRAVENOUS
  Filled 2018-06-22: qty 2

## 2018-06-22 MED ORDER — VITAL HIGH PROTEIN PO LIQD
1000.0000 mL | ORAL | Status: DC
  Administered 2018-06-22 – 2018-06-23 (×3): 1000 mL

## 2018-06-22 MED ORDER — PRO-STAT SUGAR FREE PO LIQD
60.0000 mL | Freq: Three times a day (TID) | ORAL | Status: DC
  Administered 2018-06-22 – 2018-06-24 (×6): 60 mL
  Filled 2018-06-22 (×6): qty 60

## 2018-06-22 MED ORDER — ADULT MULTIVITAMIN W/MINERALS CH
1.0000 | ORAL_TABLET | Freq: Every day | ORAL | Status: DC
  Administered 2018-06-23 – 2018-06-30 (×7): 1
  Filled 2018-06-22 (×8): qty 1

## 2018-06-22 MED ORDER — DEXMEDETOMIDINE HCL IN NACL 200 MCG/50ML IV SOLN
0.4000 ug/kg/h | INTRAVENOUS | Status: DC
  Administered 2018-06-22: 0.4 ug/kg/h via INTRAVENOUS
  Administered 2018-06-23: 1 ug/kg/h via INTRAVENOUS
  Administered 2018-06-23: 1.1 ug/kg/h via INTRAVENOUS
  Administered 2018-06-23 (×5): 1 ug/kg/h via INTRAVENOUS
  Filled 2018-06-22 (×10): qty 50

## 2018-06-22 MED ORDER — HYDROCORTISONE NA SUCCINATE PF 100 MG IJ SOLR
50.0000 mg | Freq: Four times a day (QID) | INTRAMUSCULAR | Status: DC
  Administered 2018-06-22 – 2018-06-24 (×7): 50 mg via INTRAVENOUS
  Filled 2018-06-22 (×7): qty 2

## 2018-06-22 MED FILL — Medication: Qty: 1 | Status: AC

## 2018-06-22 NOTE — Progress Notes (Signed)
Neurology Progress Note   S:// Patient seen and examined.  On propofol Versed drip along with fentanyl drip now breathing with the ventilator.   O:// Current vital signs: BP 131/66   Pulse (!) 49   Temp 97.6 F (36.4 C) (Oral)   Resp 12   Ht '5\' 8"'$  (1.727 m)   Wt 106.7 kg   SpO2 98%   BMI 35.77 kg/m  Vital signs in last 24 hours: Temp:  [96.8 F (36 C)-98.2 F (36.8 C)] 97.6 F (36.4 C) (01/31 0800) Pulse Rate:  [49-90] 49 (01/31 1146) Resp:  [7-20] 12 (01/31 1146) BP: (97-151)/(54-95) 131/66 (01/31 1146) SpO2:  [97 %-100 %] 98 % (01/31 1146) FiO2 (%):  [40 %] 40 % (01/31 1146) Weight:  [106.7 kg] 106.7 kg (01/31 0500) General: Sedated intubated in no distress HEENT: Continues to have some blotchy petechial looking lesions on the forehead and face which are improved from yesterday. CVS: S1-2 heard Respiratory: Vented Neurological exam Patient is sedated intubated in no distress No spontaneous movements Pupils are 2 mm sluggishly reactive No gaze preference or deviation Facial symmetry difficult to ascertain No response to noxious stimulation Is breathing with the ventilator  Medications  Current Facility-Administered Medications:  .  [COMPLETED] sodium chloride 0.9 % bolus 500 mL, 500 mL, Intravenous, Once, Stopped at 06/20/18 1058 **AND** 0.9 %  sodium chloride infusion, , Intravenous, Continuous, Dorie Rank, MD, Last Rate: 125 mL/hr at 06/22/18 1216 .  acetaminophen (TYLENOL) tablet 650 mg, 650 mg, Oral, Q4H PRN, Simonne Maffucci B, MD .  aspirin chewable tablet 81 mg, 81 mg, Per Tube, Daily, Desai, Rahul P, PA-C, 81 mg at 06/22/18 0932 .  chlorhexidine gluconate (MEDLINE KIT) (PERIDEX) 0.12 % solution 15 mL, 15 mL, Mouth Rinse, BID, McQuaid, Douglas B, MD, 15 mL at 06/22/18 0816 .  famotidine (PEPCID) 40 MG/5ML suspension 20 mg, 20 mg, Per Tube, BID, Simonne Maffucci B, MD, 20 mg at 06/22/18 0932 .  fentaNYL (SUBLIMAZE) bolus via infusion 25 mcg, 25 mcg,  Intravenous, Q1H PRN, Dorie Rank, MD .  fentaNYL (SUBLIMAZE) injection 100 mcg, 100 mcg, Intravenous, Q15 min PRN, Simonne Maffucci B, MD .  fentaNYL (SUBLIMAZE) injection 100 mcg, 100 mcg, Intravenous, Q2H PRN, McQuaid, Douglas B, MD .  fentaNYL 2527mg in NS 2541m(1043mml) infusion-PREMIX, 25-400 mcg/hr, Intravenous, Continuous, KnaDorie RankD, Last Rate: 20 mL/hr at 06/22/18 1215, 200 mcg/hr at 06/22/18 1215 .  heparin injection 5,000 Units, 5,000 Units, Subcutaneous, Q8H, Desai, Rahul P, PA-C, 5,000 Units at 06/22/18 [id number] levETIRAcetam (KEPPRA) 100 MG/ML solution 500 mg, 500 mg, Per Tube, BID, McQSimonne Maffucci MD, 500 mg at 06/22/18 0932 .  MEDLINE mouth rinse, 15 mL, Mouth Rinse, 10 times per day, McQSimonne Maffucci MD, 15 mL at 06/22/18 1216 .  midazolam PF (VERSED) injection 5 mg, 5 mg, Intravenous, Q5 min PRN **AND** midazolam (VERSED) bolus via infusion 5 mg, 5 mg, Intravenous, Q5 min PRN **AND** midazolam (VERSED) '50mg'$  in NS 47m29m'1mg'a$ /ml) premix infusion, 5-7 mg/hr, Intravenous, Continuous, Simpson, Paula B, NP, Last Rate: 5 mL/hr at 06/22/18 1100, 5 mg/hr at 06/22/18 1100 .  midazolam (VERSED) injection 2 mg, 2 mg, Intravenous, Q15 min PRN, McQuSimonne MaffucciMD, 2 mg at 06/21/18 1559 .  ondansetron (ZOFRAN) injection 4 mg, 4 mg, Intravenous, Q6H PRN, McQuaid, Douglas B, MD .  phenylephrine (NEOSYNEPHRINE) 10-0.9 MG/250ML-% infusion, 0-200 mcg/min, Intravenous, Titrated, Simpson, Paula B, NP, Last Rate: 37.5 mL/hr at 06/22/18 1100, 25 mcg/min at 06/22/18  1100 .  phenytoin (DILANTIN) 125 mg in sodium chloride 0.9 % 100 mL IVPB, 125 mg, Intravenous, Q8H, Romona Curls, RPH, Stopped at 06/22/18 5621 .  propofol (DIPRIVAN) 1000 MG/100ML infusion, 0-50 mcg/kg/min, Intravenous, Continuous, McQuaid, Douglas B, MD, Last Rate: 29.3 mL/hr at 06/22/18 1108, 50 mcg/kg/min at 06/22/18 1108 .  rosuvastatin (CRESTOR) tablet 10 mg, 10 mg, Per Tube, q1800, Desai, Rahul P, PA-C .  sodium chloride  flush (NS) 0.9 % injection 10-40 mL, 10-40 mL, Intracatheter, PRN, Simonne Maffucci B, MD .  thiamine '500mg'$  in normal saline (12m) IVPB, 500 mg, Intravenous, TID, AAmie Portland MD, Stopped at 06/22/18 1011 Labs CBC    Component Value Date/Time   WBC 11.4 (H) 06/21/2018 0317   RBC 4.51 06/21/2018 0317   HGB 11.2 (L) 06/22/2018 0536   HCT 33.0 (L) 06/22/2018 0536   PLT 194 06/21/2018 0317   MCV 92.9 06/21/2018 0317   MCH 30.2 06/21/2018 0317   MCHC 32.5 06/21/2018 0317   RDW 12.8 06/21/2018 0317   LYMPHSABS 4.4 (H) 06/20/2018 0928   MONOABS 1.2 (H) 06/20/2018 0928   EOSABS 0.8 (H) 06/20/2018 0928   BASOSABS 0.1 06/20/2018 0928    CMP     Component Value Date/Time   NA 143 06/22/2018 1001   K 4.1 06/22/2018 1001   CL 117 (H) 06/22/2018 1001   CO2 18 (L) 06/22/2018 1001   GLUCOSE 97 06/22/2018 1001   BUN 19 06/22/2018 1001   CREATININE 1.24 06/22/2018 1001   CALCIUM 8.0 (L) 06/22/2018 1001   PROT 5.8 (L) 06/20/2018 1844   ALBUMIN 3.3 (L) 06/20/2018 1844   AST 45 (H) 06/20/2018 1844   ALT 55 (H) 06/20/2018 1844   ALKPHOS 40 06/20/2018 1844   BILITOT 0.8 06/20/2018 1844   GFRNONAA 58 (L) 06/22/2018 1001   GFRAA >60 06/22/2018 1001    glycosylated hemoglobin  Lipid Panel     Component Value Date/Time   CHOL 127 12/26/2007 0121   TRIG 142 06/20/2018 1844   HDL 36 (L) 12/26/2007 0121   CHOLHDL 3.5 Ratio 12/26/2007 0121   VLDL 36 12/26/2007 0121   LDLCALC 55 12/26/2007 0121     Imaging I have reviewed images in epic and the results pertinent to this consultation are: MRI of the brain with old right MCA territory infarct and new punctate lesions in the left occipital territory likely incidental.  Assessment:  73year old man with a evidence of right MCA territory stroke on MRI without any clinical knowledge to the family, presented with new onset seizure.  He had to intubated because of extreme agitation.  Spinal tap done revealed no evidence of infectious  etiology. MRI revealed punctate left occipital strokes which are likely incidental. He has been having difficulty with the ventilator- asynchronous breathing.  This raise concern for possible underlying status epilepticus.  Long-term EEG from overnight has shown generalized slowing and no seizures.    Impression: New onset seizure and possible status epilepticus resolved Acute ischemic stroke likely cardioembolic but not contributing to the current picture.  Recommendations: Continue Keppra 500 twice daily. Continue Dilantin per pharmacy Minimize sedation.  Come down on propofol by 10 an hour and Versed by 1 an hour. Try using Precedex instead of propofol and Versed Continue LTM Stroke labs pending at this time Echocardiogram completed results pending. Seizure precautions We will continue to follow with you.   -- AAmie Portland MD [facility] Pager: [phone] 7pm to 7am, please call on call as listed on  [facility].  CRITICAL CARE ATTESTATION Performed by: Amie Portland, MD Total critical care time: 45 minutes Critical care time was exclusive of separately billable procedures and treating other patients and/or supervising APPs/Residents/Students Critical care was necessary to treat or prevent imminent or life-threatening deterioration due to status epilepticus, seizures, acute ischemic stroke. This patient is critically ill and at significant risk for neurological worsening and/or death and care requires constant monitoring. Critical care was time spent personally by me on the following activities: development of treatment plan with patient and/or surrogate as well as nursing, discussions with consultants, evaluation of patient's response to treatment, examination of patient, obtaining history from patient or surrogate, ordering and performing treatments and interventions, ordering and review of laboratory studies, ordering and review of radiographic studies, pulse  oximetry, re-evaluation of patient's condition, participation in multidisciplinary rounds and medical decision making of high complexity in the care of this patient.

## 2018-06-22 NOTE — Progress Notes (Signed)
Initial Nutrition Assessment  DOCUMENTATION CODES:   Obesity unspecified  INTERVENTION:   Initiate Vital High Protein @ 25 ml/hr (600 ml/day) via OG tube 60 ml Prostat TID MVI daily  Provides: 1200 kcal, 142 grams protein, and 501 ml free water.   NUTRITION DIAGNOSIS:   Inadequate oral intake related to inability to eat as evidenced by NPO status.  GOAL:   Provide needs based on ASPEN/SCCM guidelines  MONITOR:   Vent status, TF tolerance  REASON FOR ASSESSMENT:   Consult, Ventilator Enteral/tube feeding initiation and management  ASSESSMENT:   Pt with PMH of HTN, CVD, and GERD who was admitted with seizures presumed due to L occipital early infarct.    Pt discussed during ICU rounds and with RN.  No plans for extubation today, sedation weaning per neurologist  Patient is currently intubated on ventilator support Temp (24hrs), Avg:97 F (36.1 C), Min:95.2 F (35.1 C), Max:97.6 F (36.4 C)  Propofol: 23.4 ml/hr (40 mcg) provides - weaning to precedex  Medications reviewed and include: thiamine Neo @ 10 mcg Labs reviewed MAP: 76   NUTRITION - FOCUSED PHYSICAL EXAM:    Most Recent Value  Orbital Region  No depletion  Upper Arm Region  No depletion  Thoracic and Lumbar Region  No depletion  Buccal Region  Unable to assess  Temple Region  No depletion  Clavicle Bone Region  No depletion  Clavicle and Acromion Bone Region  No depletion  Scapular Bone Region  No depletion  Dorsal Hand  No depletion  Patellar Region  No depletion  Anterior Thigh Region  No depletion  Posterior Calf Region  No depletion  Edema (RD Assessment)  Mild  Hair  Reviewed  Eyes  Unable to assess  Mouth  Unable to assess  Skin  Reviewed  Nails  Reviewed       Diet Order:   Diet Order            Diet NPO time specified  Diet effective now              EDUCATION NEEDS:   No education needs have been identified at this time  Skin:  Skin Assessment: Reviewed RN  Assessment  Last BM:  unknown  Height:   Ht Readings from Last 1 Encounters:  06/20/18 5\' 8"  (1.727 m)    Weight:   Wt Readings from Last 1 Encounters:  06/22/18 106.7 kg    Ideal Body Weight:  70 kg  BMI:  Body mass index is 35.77 kg/m.  Estimated Nutritional Needs:   Kcal:  0174-9449  Protein:  >140 grams  Fluid:  > 1.5 L/day  Maylon Peppers RD, LDN, CNSC [phone] Pager [phone] After Hours Pager

## 2018-06-22 NOTE — Progress Notes (Signed)
  Echocardiogram 2D Echocardiogram has been performed.  Jeremy [name] 06/22/2018, 11:34 AM

## 2018-06-22 NOTE — Progress Notes (Signed)
NAME:  Jeremy [name], MRN:  [id number], DOB:  24-May-1945, LOS: 2 ADMISSION DATE:  06/20/2018, CONSULTATION DATE: June 20, 2018 REFERRING MD: Dr. Tomi Bamberger , CHIEF COMPLAINT: Acute loss of consciousness  Brief History   73 year old male with a past medical history significant for hypertension, cerebrovascular disease was in his usual state of health on June 20, 2018 when he suddenly had a seizure-like episode at [employer].  Was intubated for airway protection in the setting of severe combativeness.  History of present illness   This is a pleasant 73 year old male who came to our facility on June 20, 2018 in the setting of seizure-like episode, acute loss of consciousness followed by severe combativeness.  His family provides history because he was intubated on my arrival.  They state that he has been in his usual state of health.  Apparently had carotid artery surgery several months ago.  At that time he was noted to have some degree of cerebral artery stenosis.  He was awaiting neurology consultation but that had not happened yet.  In the interim since September when he last had his surgery he was complaining of significant headache on a regular basis.  They said that this has been happening off and on.  They denied any sort of alcohol intake, he does not use drugs, he does not smoke cigarettes.  They denied a history of diabetes.  At the [facility] emergency room he required intubation for airway protection.  He had no seizure activity there.  He was noted to have an elevated ammonia.  Past Medical History  Hypertension, coronary artery disease, hypercholesterolemia, gastroesophageal reflux disease, carotid artery stenosis,  Significant Hospital Events   June 20, 2018 admission  Consults:  Neurology  Procedures:  Lumbar puncture January 29: No pleocytosis  Significant Diagnostic Tests:  CT head June 20, 2018 showed no acute intracranial abnormality, chronic right  temporoparietal infarct which had been unchanged, cervical spondylosis CT angiogram chest June 20, 2018 no pulmonary embolism, bibasilar atelectasis, some aortic atherosclerosis noted MRI brain 1/29 > acute / early subacute infarction in left occipital lobe.  No hemorrhage or mass effect. RUQ [place] 1/30 > hepatic steatosis. EEG 1/30 > generalized encephalopathy. Echo 1/30 >  EEG 1/30 > diffuse disturbance, no epileptiform activity.  Micro Data:  CSF culture 1/29 >   Antimicrobials:    Interim history/subjective:  Vent dysynchrony overnight. Concern for neurological etiology.  Given ativan, loaded with fosphenytoin and started on versed gtt as well as phenytoin scheduled.  Propofol gtt and fentanyl also increased. Vent synchrony improved thereafter and ABG also improved.  Objective   Blood pressure 132/68, pulse (!) 50, temperature 97.6 F (36.4 C), temperature source Oral, resp. rate 12, height 5\' 8"  (1.727 m), weight 106.7 kg, SpO2 98 %.    Vent Mode: PCV FiO2 (%):  [40 %] 40 % Set Rate:  [10 bmp-20 bmp] 12 bmp Vt Set:  [550 mL] 550 mL PEEP:  [5 cmH20] 5 cmH20 Plateau Pressure:  [17 cmH20-21 cmH20] 20 cmH20   Intake/Output Summary (Last 24 hours) at 06/22/2018 0941 Last data filed at 06/22/2018 0900 Gross per 24 hour  Intake 5327.22 ml  Output 2995 ml  Net 2332.22 ml   Filed Weights   06/20/18 1103 06/21/18 0500 06/22/18 0500  Weight: 97.5 kg 97 kg 106.7 kg    Examination: General:  Adult male, on vent, in NAD HENT: [place] / AT.  ETT in place PULM: CTA B, vent supported breathing CV: RRR, no  mgr GI: BS+, soft, nontender MSK: normal bulk and tone Neuro: Sedated, does not follow commands   Assessment & Plan:   Acute respiratory failure with hypoxemia due to inability to protect airway. - Continue ventilator support. - Hold SBT given severe dysynchrony evening of 1/30 requiring high amounts of sedation. - Ventilator associated pneumonia prevention protocol. - Follow  CXR.  Acute onset seizure activity - presumed due to left occipital early infarct.  EEG negative for epileptiform activity. - Appreciate neurology consult. - Continue Keppra, phenytoin.  Left occipital CVA. - Neuro following. - Stroke workup per neuro.  Acute on mild CKD. - Continue supportive care. - Monitor BMP.  Troponin bump - downtrending, presumed demand. - F/u on echo.  Bradycardia - stable. - Monitor.  Hx HTN, HLD, CAD. - Continue preadmission ASA, rosuvastatin. - Hold preadmission enalapril, metoprolol, losartan.  Sedation needs in setting of mechanical ventilation. - Continue propofol gtt / Fentanyl PRN / Midazolam gtt. - RASS goal -1 to -2. - Daily WUA, hold for now given vent dysnchrony requiring high amounts of sedation.   Best practice:  Diet: N.p.o. Pain/Anxiety/Delirium protocol (if indicated): Yes, as above VAP protocol (if indicated): Yes DVT prophylaxis: SCD / heparin. GI prophylaxis: Famotidine Glucose control: None, SSI if glucose consistently > 180 Mobility: Bedrest Code Status: Full Family Communication: Son, daughter in law, and fiance updated at bedside 1/30. Disposition: ICU   Critical care time: 30 min.    Montey Hora, [facility] Pager: [phone].  If no answer, [phone] 06/22/2018, 9:41 AM

## 2018-06-22 NOTE — Progress Notes (Signed)
[facility] Progress Note Patient Name: Jeremy [name] DOB: [birth year] MRN: [id number]   Date of Service  06/22/2018  HPI/Events of Note  Patient started on Versed for vent desynchrony causing acute hypercapnea. Most recent ABG 7.49/23/83. Patient not breathing over the set rate of 16  eICU Interventions  Decreased back up rate to 12.  Patient appears to be synchronized on the vent     Intervention Category Major Interventions: Respiratory failure - evaluation and management  Judd Lien 06/22/2018, 12:09 AM

## 2018-06-22 NOTE — Progress Notes (Signed)
SLP Cancellation Note  Patient Details Name: YUSUKE BEZA MRN: [id number] DOB: 1945-12-30   Cancelled treatment:       Reason Eval/Treat Not Completed: Medical issues which prohibited therapy. Pt intubated. Order is for cognitive assessment- will complete she when able.    Houston Siren 06/22/2018, 11:45 AM  Orbie Pyo Colvin Caroli.Ed Risk analyst [phone] Office [phone]

## 2018-06-22 NOTE — Care Management Note (Signed)
Case Management Note  Patient Details  Name: ARRION BROADDUS MRN: [id number] Date of Birth: [birth year]  Subjective/Objective:   Pt admitted on 06/20/18 after having seizure-like episode at a restaurant.  He was intubated in the setting of severe combativeness.  PTA, pt independent, has supportive fiance.               Action/Plan: Pt remains currently intubated and sedated.  Will continue to follow for discharge planning as pt progresses.  Expected Discharge Date:                  Expected Discharge Plan:     In-House Referral:     Discharge planning Services  CM Consult  Post Acute Care Choice:    Choice offered to:     DME Arranged:    DME Agency:     HH Arranged:    HH Agency:     Status of Service:  In process, will continue to follow  If discussed at Long Length of Stay Meetings, dates discussed:    Additional Comments:  Ella Bodo, RN 06/22/2018, 4:15 PM

## 2018-06-22 NOTE — Progress Notes (Signed)
LTM EEG checked, no skin breakdown at FP1, Fp2, O1. O1 was re glued and paste added

## 2018-06-22 NOTE — Procedures (Signed)
CPT/Type of Study: 92924; 24hr EEG with video Recording Date: 06/21/2018 21:42 - 06/22/2018 10:00  Interpreting physician: Izora Ribas, DO  History: This is a 73 year old patient, undergoing an EEG to evaluate for seizures. Presents with new onset seizure.  Technical Description: The EEG was performed using standard setting per the guidelines of American Clinical Neurophysiology Society (ACNS).   A minimum of 21 electrodes were placed on scalp according to the International 10-20 or/and 10-10 Systems. Supplemental electrodes were placed as needed. Single EKG electrode was also used to detect cardiac arrhythmia. Patient's behavior was continuously recorded on video simultaneously with EEG. A minimum of 16 channels were used for data display. Each epoch of study was reviewed manually daily and as needed using standard referential and bipolar montages. Computerized quantitative EEG analysis (such as compressed spectral array analysis, trending, automated spike & seizure detection) were used as indicated.   Clinical State: Coma Background: Burst suppression with bursts of delta with overriding beta, at times asynchronous between the left and right hemispheres. Bursts last on average 2-4 seconds, suppression 3-10 seconds Overall Amplitude: Suppressed Asymmetry: No Sleep background: Normal sleep architecture was not seen Rhythmic or periodic pattern: None  Epileptiform activity: No Electrographic Seizure: No Events: No  Breach rhythm: No Reactivity: Yes  Stimulation procedures:  Hyperventilation: Not done Photic stimulation: Not done  Impression: This EEG shows evidence of a profound diffuse encephalopathy in the form of burst suppression. No EEG seizures were recorded.

## 2018-06-22 NOTE — Progress Notes (Signed)
OT Cancellation Note  Patient Details Name: Jeremy [name] MRN: [id number] DOB: [birth year]   Cancelled Treatment:    Reason Eval/Treat Not Completed: Patient not medically ready  Richelle Ito, OTR/L  Acute Rehabilitation Services Pager: [phone] Office: [phone] .  06/22/2018, 9:47 AM

## 2018-06-22 NOTE — Progress Notes (Signed)
Patient tolerated heavy sedation overnight. Per Dr. Rory Percy we started titrating Versed (by 1mg /hr) and Prop(by 10mg /hr) down at 1200. PTs temp this morning was 97.6 oral, and has since dropped to 95.2 rectally. Dr. Rory Percy notified as well as Dr. Ardis Hughs. Awaiting further orders and MD evaluation. Will continue to monitor closely.

## 2018-06-22 NOTE — Progress Notes (Addendum)
NAME:  Jeremy [name], MRN:  [id number], DOB:  Jul 18, 1945, LOS: 2 ADMISSION DATE:  06/20/2018, CONSULTATION DATE: June 20, 2018 REFERRING MD: Dr. Tomi Bamberger , CHIEF COMPLAINT: Acute loss of consciousness  Brief History   73 year old male with a past medical history significant for hypertension, cerebrovascular disease was in his usual state of health on June 20, 2018 when he suddenly had a seizure-like episode at [employer].  Was intubated for airway protection in the setting of severe combativeness.  History of present illness   This is a pleasant 73 year old male who came to our facility on June 20, 2018 in the setting of seizure-like episode, acute loss of consciousness followed by severe combativeness.  His family provides history because he was intubated on my arrival.  They state that he has been in his usual state of health.  Apparently had carotid artery surgery several months ago.  At that time he was noted to have some degree of cerebral artery stenosis.  He was awaiting neurology consultation but that had not happened yet.  In the interim since September when he last had his surgery he was complaining of significant headache on a regular basis.  They said that this has been happening off and on.  They denied any sort of alcohol intake, he does not use drugs, he does not smoke cigarettes.  They denied a history of diabetes.  At the [facility] emergency room he required intubation for airway protection.  He had no seizure activity there.  He was noted to have an elevated ammonia.  Past Medical History  Hypertension, coronary artery disease, hypercholesterolemia, gastroesophageal reflux disease, carotid artery stenosis,  Significant Hospital Events   June 20, 2018 admission  Consults:  Neurology  Procedures:  Lumbar puncture January 29: No pleocytosis  Significant Diagnostic Tests:  CT head June 20, 2018 showed no acute intracranial abnormality, chronic right  temporoparietal infarct which had been unchanged, cervical spondylosis CT angiogram chest June 20, 2018 no pulmonary embolism, bibasilar atelectasis, some aortic atherosclerosis noted MRI brain 1/29 > acute / early subacute infarction in left occipital lobe.  No hemorrhage or mass effect. RUQ [place] 1/30 > hepatic steatosis. EEG 1/30 > generalized encephalopathy. Echo 1/30 >  EEG 1/30 > diffuse disturbance, no epileptiform activity.  Micro Data:  CSF culture 1/29 >   Antimicrobials:    Interim history/subjective:  Vent dysynchrony overnight. Concern for neurological etiology.  Given ativan, loaded with fosphenytoin and started on versed gtt as well as phenytoin scheduled.  Propofol gtt and fentanyl also increased. Vent synchrony improved thereafter and ABG also improved.  Objective   Blood pressure 132/68, pulse (!) 50, temperature 97.6 F (36.4 C), temperature source Oral, resp. rate 12, height 5\' 8"  (1.727 m), weight 106.7 kg, SpO2 98 %.    Vent Mode: PCV FiO2 (%):  [40 %] 40 % Set Rate:  [10 bmp-20 bmp] 12 bmp Vt Set:  [550 mL] 550 mL PEEP:  [5 cmH20] 5 cmH20 Plateau Pressure:  [17 cmH20-21 cmH20] 20 cmH20   Intake/Output Summary (Last 24 hours) at 06/22/2018 0955 Last data filed at 06/22/2018 0900 Gross per 24 hour  Intake 5327.22 ml  Output 2995 ml  Net 2332.22 ml   Filed Weights   06/20/18 1103 06/21/18 0500 06/22/18 0500  Weight: 97.5 kg 97 kg 106.7 kg    Examination: General:  Adult male, on vent, in NAD HENT: [place] / AT.  ETT in place PULM: CTA B, vent supported breathing CV: RRR, no  mgr GI: BS+, soft, nontender MSK: normal bulk and tone Neuro: Sedated, does not follow commands   Assessment & Plan:   Acute respiratory failure with hypoxemia due to inability to protect airway. - Continue ventilator support. - Hold SBT given severe dysynchrony evening of 1/30 requiring high amounts of sedation. - Ventilator associated pneumonia prevention protocol. - Follow  CXR.  Acute onset seizure activity - presumed due to left occipital early infarct.  EEG negative for epileptiform activity. - Appreciate neurology consult. - Continue Keppra, phenytoin.  Left occipital CVA. - Neuro following. - Stroke workup per neuro.  Acute on mild CKD. - Continue supportive care. - Monitor BMP.  Troponin bump - downtrending, presumed demand. - F/u on echo.  Bradycardia - stable. - Monitor.  Hx HTN, HLD, CAD. - Continue preadmission ASA, rosuvastatin. - Hold preadmission enalapril, metoprolol, losartan.  Sedation needs in setting of mechanical ventilation. - Continue propofol gtt / Fentanyl PRN / Midazolam gtt. - RASS goal -1 to -2. - Daily WUA, hold for now given vent dysnchrony requiring high amounts of sedation.   Best practice:  Diet: N.p.o.  Start TF's. Pain/Anxiety/Delirium protocol (if indicated): Yes, as above VAP protocol (if indicated): Yes DVT prophylaxis: SCD / heparin. GI prophylaxis: Famotidine Glucose control: None, SSI if glucose consistently > 180 Mobility: Bedrest Code Status: Full Family Communication: Son, daughter in law, and fiance updated at bedside 1/30. Disposition: ICU   Critical care time: 30 min.    Montey Hora, [place] [facility] Pager: [phone].  If no answer, [phone] 06/22/2018, 9:55 AM   Attending note: I have seen and examined the patient. History, labs and imaging reviewed.  Significant vent dyssynchrony last night Now on 2 antiepileptics.  Heavily sedated on propofol, Versed ABG better.  Blood pressure 117/62, pulse (!) 51, temperature (!) 95.5 F (35.3 C), resp. rate 12, height 5\' 8"  (1.727 m), weight 106.7 kg, SpO2 100 %. Gen:      No acute distress HEENT:  EOMI, sclera anicteric, ET tube Neck:     No masses; no thyromegaly Lungs:    Clear to auscultation bilaterally; normal respiratory effort CV:         Regular rate and rhythm; no murmurs Abd:       + bowel sounds; soft, non-tender; no palpable masses, no distension Ext:    No edema; adequate peripheral perfusion Skin:      Warm and dry; no rash Neuro: Sedated, unresponsive.  Labs reviewed, significant for Sodium 143, potassium 4.1, BUN/creatinine 19/1.24 Hemoglobin 11.2  Imaging Chest x-ray 06/22/2018- ETT, NG tube in stable portion Persistent bibasilar atelectasis, infiltrate.  I have reviewed the images personally.  Assessment/plan: 72 year old with seizures, occipital infarct  Seizure, CVA Continue Keppra and Dilantin Sedated with propofol and Versed Follow EEG results  Respiratory failure in the setting of inability to protect airway Vent dyssynchrony Continue vent support  Elevated troponin, trending down > likely demand Echo noted with reduction in EF, no wall motion abnormalities Stress test in 2019 with no ischemia.  The patient is critically ill with multiple organ system failure and requires high complexity decision making for assessment and support, frequent evaluation and titration of therapies, advanced monitoring, review of radiographic studies and interpretation of complex data.   Critical Care Time devoted to patient care services, exclusive of separately billable procedures, described in this note is 35 minutes.   Marshell Garfinkel MD [place] Pager  [phone] If no answer call [phone] 06/22/2018, 4:54 PM

## 2018-06-23 ENCOUNTER — Inpatient Hospital Stay (HOSPITAL_COMMUNITY): Payer: PPO

## 2018-06-23 DIAGNOSIS — I5021 Acute systolic (congestive) heart failure: Secondary | ICD-10-CM

## 2018-06-23 DIAGNOSIS — G9341 Metabolic encephalopathy: Secondary | ICD-10-CM

## 2018-06-23 DIAGNOSIS — E8770 Fluid overload, unspecified: Secondary | ICD-10-CM

## 2018-06-23 DIAGNOSIS — Z9911 Dependence on respirator [ventilator] status: Secondary | ICD-10-CM

## 2018-06-23 LAB — LIPID PANEL
CHOL/HDL RATIO: 4.9 ratio
CHOLESTEROL: 195 mg/dL (ref 0–200)
HDL: 40 mg/dL — ABNORMAL LOW (ref >40)
LDL Cholesterol: 113 mg/dL — ABNORMAL HIGH (ref 0–99)
Triglycerides: 212 mg/dL — ABNORMAL HIGH (ref <150)
VLDL: 42 mg/dL — ABNORMAL HIGH (ref 0–40)

## 2018-06-23 LAB — POCT I-STAT 7, (LYTES, BLD GAS, ICA,H+H)
ACID-BASE DEFICIT: 6 mmol/L — AB (ref 0.0–2.0)
Acid-base deficit: 3 mmol/L — ABNORMAL HIGH (ref 0.0–2.0)
Bicarbonate: 21.6 mmol/L (ref 20.0–28.0)
Bicarbonate: 20.4 mmol/L (ref 20.0–28.0)
CALCIUM ION: 1.17 mmol/L (ref 1.15–1.40)
Calcium, Ion: 1.22 mmol/L (ref 1.15–1.40)
HCT: 37 % — ABNORMAL LOW (ref 39.0–52.0)
HEMATOCRIT: 35 % — AB (ref 39.0–52.0)
Hemoglobin: 11.9 g/dL — ABNORMAL LOW (ref 13.0–17.0)
Hemoglobin: 12.6 g/dL — ABNORMAL LOW (ref 13.0–17.0)
O2 Saturation: 98 %
O2 Saturation: 98 %
PO2 ART: 136 mmHg — AB (ref 83.0–108.0)
PO2 ART: 105 mmHg (ref 83.0–108.0)
Patient temperature: 99.3
Patient temperature: 99
Potassium: 4.7 mmol/L (ref 3.5–5.1)
Potassium: 3.8 mmol/L (ref 3.5–5.1)
Sodium: 143 mmol/L (ref 135–145)
Sodium: 142 mmol/L (ref 135–145)
TCO2: 23 mmol/L (ref 22–32)
TCO2: 21 mmol/L — ABNORMAL LOW (ref 22–32)
pCO2 arterial: 50.7 mmHg — ABNORMAL HIGH (ref 32.0–48.0)
pCO2 arterial: 32.1 mmHg (ref 32.0–48.0)
pH, Arterial: 7.24 — ABNORMAL LOW (ref 7.350–7.450)
pH, Arterial: 7.412 (ref 7.350–7.450)

## 2018-06-23 LAB — GLUCOSE, CAPILLARY
GLUCOSE-CAPILLARY: 133 mg/dL — AB (ref 70–99)
Glucose-Capillary: 107 mg/dL — ABNORMAL HIGH (ref 70–99)
Glucose-Capillary: 120 mg/dL — ABNORMAL HIGH (ref 70–99)
Glucose-Capillary: 135 mg/dL — ABNORMAL HIGH (ref 70–99)
Glucose-Capillary: 160 mg/dL — ABNORMAL HIGH (ref 70–99)
Glucose-Capillary: 94 mg/dL (ref 70–99)

## 2018-06-23 LAB — BASIC METABOLIC PANEL
Anion gap: 9 (ref 5–15)
BUN: 27 mg/dL — AB (ref 8–23)
CO2: 17 mmol/L — ABNORMAL LOW (ref 22–32)
Calcium: 8.1 mg/dL — ABNORMAL LOW (ref 8.9–10.3)
Chloride: 118 mmol/L — ABNORMAL HIGH (ref 98–111)
Creatinine, Ser: 1.26 mg/dL — ABNORMAL HIGH (ref 0.61–1.24)
GFR calc Af Amer: >60 mL/min (ref >60)
GFR, EST NON AFRICAN AMERICAN: 57 mL/min — AB (ref >60)
GLUCOSE: 101 mg/dL — AB (ref 70–99)
Potassium: 5.1 mmol/L (ref 3.5–5.1)
Sodium: 144 mmol/L (ref 135–145)

## 2018-06-23 LAB — CBC
HEMATOCRIT: 40.7 % (ref 39.0–52.0)
HEMOGLOBIN: 13.1 g/dL (ref 13.0–17.0)
MCH: 30.8 pg (ref 26.0–34.0)
MCHC: 32.2 g/dL (ref 30.0–36.0)
MCV: 95.8 fL (ref 80.0–100.0)
Platelets: 168 10*3/uL (ref 150–400)
RBC: 4.25 MIL/uL (ref 4.22–5.81)
RDW: 13.1 % (ref 11.5–15.5)
WBC: 9.5 10*3/uL (ref 4.0–10.5)
nRBC: 0 % (ref 0.0–0.2)

## 2018-06-23 LAB — HEMOGLOBIN A1C
Hgb A1c MFr Bld: 5.3 % (ref 4.8–5.6)
Mean Plasma Glucose: 105.41 mg/dL

## 2018-06-23 LAB — T3: T3, Total: 62 ng/dL — ABNORMAL LOW (ref 71–180)

## 2018-06-23 LAB — PHOSPHORUS
PHOSPHORUS: 3.2 mg/dL (ref 2.5–4.6)
Phosphorus: 4.1 mg/dL (ref 2.5–4.6)

## 2018-06-23 LAB — CSF CULTURE W GRAM STAIN
Culture: NO GROWTH
Gram Stain: NONE SEEN

## 2018-06-23 LAB — MAGNESIUM
Magnesium: 2.2 mg/dL (ref 1.7–2.4)
Magnesium: 2.1 mg/dL (ref 1.7–2.4)

## 2018-06-23 LAB — TRIGLYCERIDES: Triglycerides: 295 mg/dL — ABNORMAL HIGH (ref <150)

## 2018-06-23 LAB — T3, FREE: T3, Free: 2 pg/mL (ref 2.0–4.4)

## 2018-06-23 LAB — T4: T4 TOTAL: 6.4 ug/dL (ref 4.5–12.0)

## 2018-06-23 LAB — TROPONIN I: Troponin I: 1.01 ng/mL (ref <0.03)

## 2018-06-23 MED ORDER — SODIUM CHLORIDE 0.9 % IV SOLN
100.0000 mg | Freq: Two times a day (BID) | INTRAVENOUS | Status: AC
  Administered 2018-06-23 – 2018-06-26 (×6): 100 mg via INTRAVENOUS
  Filled 2018-06-23 (×8): qty 10

## 2018-06-23 MED ORDER — FUROSEMIDE 10 MG/ML IJ SOLN
20.0000 mg | Freq: Three times a day (TID) | INTRAMUSCULAR | Status: DC
  Administered 2018-06-23 – 2018-06-25 (×6): 20 mg via INTRAVENOUS
  Filled 2018-06-23 (×5): qty 2

## 2018-06-23 MED ORDER — SODIUM CHLORIDE 0.9 % IV SOLN
200.0000 mg | Freq: Once | INTRAVENOUS | Status: AC
  Administered 2018-06-23: 200 mg via INTRAVENOUS
  Filled 2018-06-23: qty 20

## 2018-06-23 MED ORDER — IPRATROPIUM-ALBUTEROL 0.5-2.5 (3) MG/3ML IN SOLN
3.0000 mL | Freq: Once | RESPIRATORY_TRACT | Status: AC
  Administered 2018-06-23: 3 mL via RESPIRATORY_TRACT
  Filled 2018-06-23: qty 3

## 2018-06-23 MED ORDER — MIDAZOLAM HCL 2 MG/2ML IJ SOLN
1.0000 mg | INTRAMUSCULAR | Status: DC | PRN
  Administered 2018-06-23 – 2018-06-24 (×8): 1 mg via INTRAVENOUS
  Filled 2018-06-23 (×11): qty 2

## 2018-06-23 MED ORDER — DEXMEDETOMIDINE HCL IN NACL 400 MCG/100ML IV SOLN
0.4000 ug/kg/h | INTRAVENOUS | Status: DC
  Administered 2018-06-23: 1.2 ug/kg/h via INTRAVENOUS
  Administered 2018-06-24: 1 ug/kg/h via INTRAVENOUS
  Administered 2018-06-24 (×2): 1.2 ug/kg/h via INTRAVENOUS
  Filled 2018-06-23 (×4): qty 100

## 2018-06-23 MED ORDER — IPRATROPIUM-ALBUTEROL 0.5-2.5 (3) MG/3ML IN SOLN
3.0000 mL | Freq: Four times a day (QID) | RESPIRATORY_TRACT | Status: DC
  Administered 2018-06-23 – 2018-06-26 (×12): 3 mL via RESPIRATORY_TRACT
  Filled 2018-06-23 (×13): qty 3

## 2018-06-23 MED ORDER — FUROSEMIDE 10 MG/ML IJ SOLN
40.0000 mg | Freq: Once | INTRAMUSCULAR | Status: AC
  Administered 2018-06-23: 40 mg via INTRAVENOUS
  Filled 2018-06-23: qty 4

## 2018-06-23 NOTE — Plan of Care (Signed)
  Problem: Activity: Goal: Ability to tolerate increased activity will improve Outcome: Progressing   Problem: Respiratory: Goal: Ability to maintain a clear airway and adequate ventilation will improve Outcome: Progressing   Problem: Activity: Goal: Risk for activity intolerance will decrease Outcome: Progressing   Problem: Coping: Goal: Level of anxiety will decrease Outcome: Progressing   Problem: Elimination: Goal: Will not experience complications related to urinary retention Outcome: Progressing   Problem: Safety: Goal: Ability to remain free from injury will improve Outcome: Progressing   Problem: Skin Integrity: Goal: Risk for impaired skin integrity will decrease Outcome: Progressing   Problem: Coping: Goal: Ability to adjust to condition or change in health will improve Outcome: Progressing   Problem: Self-Concept: Goal: Level of anxiety will decrease Outcome: Progressing Goal: Ability to verbalize feelings about condition will improve Outcome: Progressing

## 2018-06-23 NOTE — Progress Notes (Signed)
[facility] Progress Note Patient Name: Jeremy [name] DOB: 12-01-1945 MRN: [id number]   Date of Service  06/23/2018  HPI/Events of Note  Notified of ventilatory asynchrony, sedated on Fentanyl, off Versed. Patient auto peeping.   eICU Interventions   Increased PEEP to 10, PC 16 with improved synchrony. Ordered versed prn     Intervention Category Major Interventions: Respiratory failure - evaluation and management  Judd Lien 06/23/2018, 1:45 AM

## 2018-06-23 NOTE — Progress Notes (Signed)
Neurology Progress Note   S:// Patient seen and examined. Has had spells of difficulty maintaining synchrony with the ventilator.  Primary team concern for these being epileptic spells. Long-term EEG overnight showed resolved burst suppression and emergence of sharply contoured generalized rhythmic delta activity which is nonspecific encephalopathy pattern with some underlying cortical irritability.  No discrete seizures were seen.   O:// Current vital signs: BP (!) 155/84   Pulse 83   Temp 99.3 F (37.4 C)   Resp (!) 7   Ht '5\' 8"'$  (1.727 m)   Wt 106.7 kg   SpO2 98%   BMI 35.77 kg/m  Vital signs in last 24 hours: Temp:  [95.1 F (35.1 C)-100.1 F (37.8 C)] 99.3 F (37.4 C) (02/01 0700) Pulse Rate:  [46-95] 83 (02/01 0700) Resp:  [0-17] 7 (02/01 0700) BP: (103-166)/(56-90) 155/84 (02/01 0700) SpO2:  [89 %-100 %] 98 % (02/01 0700) FiO2 (%):  [40 %] 40 % (02/01 0314) General: Sedated intubated-on fentanyl 400 an hour HEENT: Reddish appearing face, redness worsens when he bears down or resists breathing on the ventilator. CVS: Respiratory regular rhythm Respiratory: Vented, at times difficult to synchronize breathing with the vent even on sedation. Abdomen: Appears mildly distended Extremities: Pitting pedal edema bilaterally Neurological exam Sedated intubated Sedation was held for the exam. As soon as the sedation with fentanyl was held, he started to wake up spontaneously, and moved all 4 extremities. His pupils are equal round reactive to light He did not look from side to side and did not follow any commands. He moved all 4 of his extremities vigorously with equal strength. He withdrew all 4 extremities to noxious stimulation  Medications  Current Facility-Administered Medications:  .  [COMPLETED] sodium chloride 0.9 % bolus 500 mL, 500 mL, Intravenous, Once, Stopped at 06/20/18 1058 **AND** 0.9 %  sodium chloride infusion, , Intravenous, Continuous, Dorie Rank, MD,  Last Rate: 125 mL/hr at 06/23/18 0511, 1,000 mL at 06/23/18 0511 .  acetaminophen (TYLENOL) tablet 650 mg, 650 mg, Oral, Q4H PRN, Simonne Maffucci B, MD .  aspirin chewable tablet 81 mg, 81 mg, Per Tube, Daily, Desai, Rahul P, PA-C, 81 mg at 06/22/18 0932 .  chlorhexidine gluconate (MEDLINE KIT) (PERIDEX) 0.12 % solution 15 mL, 15 mL, Mouth Rinse, BID, McQuaid, Douglas B, MD, 15 mL at 06/23/18 0752 .  dexmedetomidine (PRECEDEX) 200 MCG/50ML (4 mcg/mL) infusion, 0.4-1.2 mcg/kg/hr, Intravenous, Titrated, Amie Portland, MD, Stopped at 06/22/18 2126 .  famotidine (PEPCID) 40 MG/5ML suspension 20 mg, 20 mg, Per Tube, BID, Simonne Maffucci B, MD, 20 mg at 06/22/18 2146 .  feeding supplement (PRO-STAT SUGAR FREE 64) liquid 60 mL, 60 mL, Per Tube, TID, Mannam, Praveen, MD, 60 mL at 06/22/18 2145 .  feeding supplement (VITAL HIGH PROTEIN) liquid 1,000 mL, 1,000 mL, Per Tube, Q24H, Mannam, Praveen, MD, Stopped at 06/22/18 2320 .  fentaNYL (SUBLIMAZE) bolus via infusion 25 mcg, 25 mcg, Intravenous, Q1H PRN, Dorie Rank, MD, 50 mcg at 06/22/18 2208 .  fentaNYL (SUBLIMAZE) injection 100 mcg, 100 mcg, Intravenous, Q15 min PRN, McQuaid, Douglas B, MD .  fentaNYL (SUBLIMAZE) injection 100 mcg, 100 mcg, Intravenous, Q2H PRN, McQuaid, Douglas B, MD .  fentaNYL 2510mg in NS 2543m(1044mml) infusion-PREMIX, 25-400 mcg/hr, Intravenous, Continuous, KnaDorie RankD, Last Rate: 40 mL/hr at 06/23/18 0559, 400 mcg/hr at 06/23/18 0559 .  heparin injection 5,000 Units, 5,000 Units, Subcutaneous, Q8H, Desai, Rahul P, PA-C, 5,000 Units at 06/23/18 0616 .  hydrocortisone sodium succinate (SOLU-CORTEF) 100 MG injection  50 mg, 50 mg, Intravenous, Q6H, Rush Farmer, MD, 50 mg at 06/23/18 0319 .  levETIRAcetam (KEPPRA) 100 MG/ML solution 500 mg, 500 mg, Per Tube, BID, Simonne Maffucci B, MD, 500 mg at 06/22/18 2146 .  MEDLINE mouth rinse, 15 mL, Mouth Rinse, 10 times per day, Simonne Maffucci B, MD, 15 mL at 06/23/18 0620 .   midazolam (VERSED) injection 1 mg, 1 mg, Intravenous, Q2H PRN, Judd Lien, MD, 1 mg at 06/23/18 0751 .  multivitamin with minerals tablet 1 tablet, 1 tablet, Per Tube, Daily, Mannam, Praveen, MD .  ondansetron (ZOFRAN) injection 4 mg, 4 mg, Intravenous, Q6H PRN, McQuaid, Douglas B, MD .  phenylephrine (NEOSYNEPHRINE) 10-0.9 MG/250ML-% infusion, 0-200 mcg/min, Intravenous, Titrated, Jennelle Human B, NP, Stopped at 06/23/18 0125 .  phenytoin (DILANTIN) 125 mg in sodium chloride 0.9 % 100 mL IVPB, 125 mg, Intravenous, Q8H, Romona Curls, [facility], Last Rate: 205 mL/hr at 06/22/18 2342, 125 mg at 06/22/18 2342 .  rosuvastatin (CRESTOR) tablet 10 mg, 10 mg, Per Tube, q1800, Desai, Rahul P, PA-C, 10 mg at 06/22/18 1942 .  sodium chloride flush (NS) 0.9 % injection 10-40 mL, 10-40 mL, Intracatheter, PRN, Simonne Maffucci B, MD .  thiamine '500mg'$  in normal saline (71m) IVPB, 500 mg, Intravenous, TID, AAmie Portland MD, Stopped at 06/23/18 0730 Labs CBC    Component Value Date/Time   WBC 11.4 (H) 06/21/2018 0317   RBC 4.51 06/21/2018 0317   HGB 11.9 (L) 06/23/2018 0645   HCT 35.0 (L) 06/23/2018 0645   PLT 194 06/21/2018 0317   MCV 92.9 06/21/2018 0317   MCH 30.2 06/21/2018 0317   MCHC 32.5 06/21/2018 0317   RDW 12.8 06/21/2018 0317   LYMPHSABS 4.4 (H) 06/20/2018 0928   MONOABS 1.2 (H) 06/20/2018 0928   EOSABS 0.8 (H) 06/20/2018 0928   BASOSABS 0.1 06/20/2018 0928    CMP     Component Value Date/Time   NA 143 06/23/2018 0645   K 4.7 06/23/2018 0645   CL 118 (H) 06/23/2018 0626   CO2 17 (L) 06/23/2018 0626   GLUCOSE 101 (H) 06/23/2018 0626   BUN 27 (H) 06/23/2018 0626   CREATININE 1.26 (H) 06/23/2018 0626   CALCIUM 8.1 (L) 06/23/2018 0626   PROT 5.8 (L) 06/20/2018 1844   ALBUMIN 3.3 (L) 06/20/2018 1844   AST 45 (H) 06/20/2018 1844   ALT 55 (H) 06/20/2018 1844   ALKPHOS 40 06/20/2018 1844   BILITOT 0.8 06/20/2018 1844   GFRNONAA 57 (L) 06/23/2018 0626   GFRAA >60 06/23/2018 0626    HSV PCR negative on the CSF  Imaging I have reviewed images in epic and the results pertinent to this consultation are: MRI of the brain with without contrast showed a subcentimeter focus of restricted diffusion in the left occipital lobe consistent with acute/subacute infarct.  Stable chronic microvascular damage and stable right temporoparietal chronic infarction.  LTM EEG SUMMARY: This was an abnormal continuous video EEG due to generalized slowing, resolved burst-suppression, and emergence of sharply contoured GRDA. This was indicative of a non-specific encephalopathy pattern with some underlying cortical irritability. No discrete seizures were seen.   Stroke work-up labs Echocardiogram- reduced systolic function 35 to 429%  Normal left atrial size.  Normal right atrial size.  Normal tricuspid valve.  Normal mitral valve with mild mitral regurgitation.  Mild tricuspid regurgitation.  Mild thickening of aortic valve which is tricuspid.  No atrial level shunt. 2D echocardiogram from July 2019 had a LVEF of 60 to  65%.  LDL 113 Hemoglobin A1c - p  Troponin 1.01  Assessment: 73 year old man with a known history of right carotid stenosis, old by imaging right parietal infarct, presenting with new onset seizure. Description was classic for seizure activity.  MRI showed left occipital punctate acute/subacute stroke which I think is incidental. Treated with antibiotics while an LP was performed to rule out an infection.  LP not concerning for a CNS infection. Continue to have difficulty with vent synchrony and has been sedated heavily. Propofol and Precedex caused bradycardia and hypotension and has been on max doses of fentanyl overnight.  Noted to have low cortisol level.  Steroid replacement in place.  Impression: Seizure, status epilepticus-resolved Acute ischemic stroke-likely incidental Toxic metabolic encephalopathy  Recommendations:  Seizure Continue with Keppra and Dilantin  at the current doses.  Phenytoin level 11.2 Add Vimpat given the generalized rhythmic delta is with sharp waves. Management of the vent per primary team.  Spoke with Dr. Valeta Harms personally.  He is making changes the vent so that we are able to lower the sedation and get him medically optimized for extubation. Continue on LTM for now. Maintain seizure precautions  Stroke -Likely cardioembolic due to deranged cardiac function -systolic heart failure - I7X pending. -Rest of the work-up completed - Continue aspirin.  Respiratory failure -Management per PCCM.  Systolic CHF &Troponinemia -management per PCCM  Low cortisol - Getting steroids - CT abdomen unremarkable for adrenal disease -Management per PCCM.  Concern for redness of the face -he had this on presentation to the hospital.  Less likely due to phenytoin side effect.  -- Amie Portland, MD [facility] Pager: [phone] If 7pm to 7am, please call on call as listed on AMION.  CRITICAL CARE ATTESTATION Performed by: Amie Portland, MD Total critical care time: 45 minutes Critical care time was exclusive of separately billable procedures and treating other patients and/or supervising APPs/Residents/Students Critical care was necessary to treat or prevent imminent or life-threatening deterioration due to seizures, status epilepticus, respiratory failure. This patient is critically ill and at significant risk for neurological worsening and/or death and care requires constant monitoring. Critical care was time spent personally by me on the following activities: development of treatment plan with patient and/or surrogate as well as nursing, discussions with consultants, evaluation of patient's response to treatment, examination of patient, obtaining history from patient or surrogate, ordering and performing treatments and interventions, ordering and review of laboratory studies, ordering and review of radiographic studies,  pulse oximetry, re-evaluation of patient's condition, participation in multidisciplinary rounds and medical decision making of high complexity in the care of this patient.

## 2018-06-23 NOTE — Progress Notes (Addendum)
NAME:  Jeremy [name], MRN:  [id number], DOB:  05-31-1945, LOS: 3 ADMISSION DATE:  06/20/2018, CONSULTATION DATE: June 20, 2018 REFERRING MD: Dr. Tomi Bamberger , CHIEF COMPLAINT: Acute loss of consciousness  Brief History   73 year old male with a past medical history significant for hypertension, cerebrovascular disease was in his usual state of health on June 20, 2018 when he suddenly had a seizure-like episode at [employer].  Was intubated for airway protection in the setting of severe combativeness.  History of present illness   This is a pleasant 73 year old male who came to our facility on June 20, 2018 in the setting of seizure-like episode, acute loss of consciousness followed by severe combativeness.  His family provides history because he was intubated on my arrival.  They state that he has been in his usual state of health.  Apparently had carotid artery surgery several months ago.  At that time he was noted to have some degree of cerebral artery stenosis.  He was awaiting neurology consultation but that had not happened yet.  In the interim since September when he last had his surgery he was complaining of significant headache on a regular basis.  They said that this has been happening off and on.  They denied any sort of alcohol intake, he does not use drugs, he does not smoke cigarettes.  They denied a history of diabetes.  At the [facility] emergency room he required intubation for airway protection.  He had no seizure activity there.  He was noted to have an elevated ammonia.  Past Medical History  Hypertension, coronary artery disease, hypercholesterolemia, gastroesophageal reflux disease, carotid artery stenosis,  Significant Hospital Events   June 20, 2018 admission  Consults:  Neurology  Procedures:  Lumbar puncture January 29: No pleocytosis  ECHO 1/31: EF 35 to 40%, mild MR, mild TR  Significant Diagnostic Tests:  CT head June 20, 2018 showed no acute  intracranial abnormality, chronic right temporoparietal infarct which had been unchanged, cervical spondylosis CT angiogram chest June 20, 2018 no pulmonary embolism, bibasilar atelectasis, some aortic atherosclerosis noted MRI brain 1/29 > acute / early subacute infarction in left occipital lobe.  No hemorrhage or mass effect. RUQ [place] 1/30 > hepatic steatosis. EEG 1/30 > generalized encephalopathy. Echo 1/30 >  EEG 1/30 > diffuse disturbance, no epileptiform activity.  Micro Data:  CSF culture 1/29 >   Antimicrobials:    Interim history/subjective:  Sedation issues overnight.  On 400 fentanyl this morning.  No continuous IV sedative besides opiate.  Patient seen and examined this morning family at bedside.  Was in pressure control with volumes exceeding 1100 cc.  Objective   Blood pressure (!) 155/84, pulse 83, temperature 99.3 F (37.4 C), resp. rate (!) 7, height 5\' 8"  (1.727 m), weight 106.7 kg, SpO2 98 %.    Vent Mode: PCV FiO2 (%):  [40 %] 40 % Set Rate:  [12 bmp] 12 bmp PEEP:  [5 cmH20-10 cmH20] 10 cmH20 Plateau Pressure:  [14 cmH20-24 cmH20] 24 cmH20   Intake/Output Summary (Last 24 hours) at 06/23/2018 0859 Last data filed at 06/23/2018 0700 Gross per 24 hour  Intake 4711.53 ml  Output 1850 ml  Net 2861.53 ml   Filed Weights   06/20/18 1103 06/21/18 0500 06/22/18 0500  Weight: 97.5 kg 97 kg 106.7 kg    Examination: General appearance: 73 y.o., male, intubated on mechanical ventilation Eyes: anicteric sclerae, moist conjunctivae; pupils reactive, tracking appropriately HENT: NCAT; oropharynx, MMM, large neck Neck:  Trachea midline; large neck no JVD, ET tube in place Lungs: Bilateral ventilated breath sounds, bilateral lower lobe crackles CV: Tachycardic, regular, S1-S2, distant heart tones Chest: Slight barrel chest on examination Abdomen: Soft, nontender, very distended, bowel sounds present Extremities: Hassan Rowan lower extremity dependent edema Skin: Normal  temperature, turgor and texture; no rash Neuro: Wakes up quickly off sedation, does not follow commands however moves all 4 extremities spontaneously.  Very strong   Assessment & Plan:   Acute respiratory failure with hypoxemia due to inability to protect airway, intubated on mechanical ventilation Will make changes today to ventilatory settings. Patient was on pressure control this morning with excess tidal volumes Patient also excessively sedated on 400 of fentanyl I have restarted the patient's Precedex at 1 Patient will be sedated with PRN fentanyl bolus Switched from pressure control to volume control attempt to maintain minute ventilation around 10 L. Does not have an oxygenation issue at this time, FiO2 at 40%. Chest x-ray with vascular congestion, The patient's images have been independently reviewed by me.   Patient will need a repeat arterial blood gas in approximately an hour or so after changes of ventilator. Scheduled bronchodilators.  Former remote history of smoking, no prior PFTs.  Acute onset seizure activity - presumed due to left occipital early infarct.  EEG negative for epileptiform activity. Continue Keppra and phenytoin Vimpat added by neurology Appreciate neurology recommendations regarding seizure management. Continue LTV EEG per neurology  Left occipital CVA. Stroke work-up per neurology  Acute on mild CKD. No intervention at this time. We will continue to follow  Acute on chronic systolic heart failure Positive cumulative fluid balance Troponin elevation, likely related to volume overload and heart failure exacerbation - This is a new diagnosis for him.  Prior EF was normal. - We will need to diurese and once extubated will need to be evaluated by cardiology.  Acute metabolic toxic encephalopathy, acute delirium I suspect related to multiple medications used for sedation yesterday and today. We will need to lighten these as much as  possible.  Bradycardia -stable.  Hx HTN, HLD, CAD. Continue aspirin and rosuvastatin As blood pressure tolerates diuresis can restart low-dose beta-blockade  Best practice:  Diet: N.p.o.  Start TF's. Pain/Anxiety/Delirium protocol (if indicated): Yes, as above VAP protocol (if indicated): Yes DVT prophylaxis: SCD / heparin. GI prophylaxis: Famotidine Glucose control: None, SSI if glucose consistently > 180 Mobility: Bedrest Code Status: Full Family Communication: Son, daughter in law, and fiance updated at bedside 1/30. Disposition: ICU  This patient is critically ill with multiple organ system failure; which, requires frequent high complexity decision making, assessment, support, evaluation, and titration of therapies. This was completed through the application of advanced monitoring technologies and extensive interpretation of multiple databases.  Bedside time spent making and adjusting drip rates as well as altering mechanical ventilator settings.  During this encounter critical care time was devoted to patient care services described in this note for 48 minutes.   Garner Nash, DO [facility] 06/23/2018 9:00 AM  Personal pager: [phone] If unanswered, please page CCM On-call: [phone]

## 2018-06-23 NOTE — Progress Notes (Addendum)
Reported during shift change that EEG froze. EEG Tech called with no response, unsure if they are here yet. Will continue to call back so that EEG may be resumed.

## 2018-06-23 NOTE — Progress Notes (Signed)
PT Cancellation Note  Patient Details Name: ARTIST BLOOM MRN: [id number] DOB: January 10, 1946   Cancelled Treatment:    Reason Eval/Treat Not Completed: Patient not medically ready. Will continue to check back.   Leighton Roach, PT  Acute Rehab Services  Pager [phone] Office [place] 06/23/2018, 1:01 PM

## 2018-06-23 NOTE — Procedures (Signed)
LTM-EEG Report  HISTORY: Continuous video-EEG monitoring performed for 73 year old with altered mental status, possible seizures.  ACQUISITION: International 10-20 system for electrode placement; 18 channels with additional eyes linked to ipsilateral ears and EKG. Additional T1-T2 electrodes were used. Continuous video recording obtained.   EEG NUMBER:  MEDICATIONS:  Day 2: see EMR    DAY #2: from 1000 06/22/18 to 0730 06/23/18   BACKGROUND: This was initially a burst-suppression recording with waning of suppression by midday as sedation was weaned. There was emergence of a slow, 1-5Hz  activity with superimposed GRDA in the afternoon. Reactivity was present, however no clear sleep architecture was seen.  EPILEPTIFORM/PERIODIC ACTIVITY: Emergence of generalized rhythmic delta activity (GRDA), sharply contoured at times, with 1Hz  frequency. This showed no clear clinical correlate or ictal evolution. SEIZURES: none EVENTS: none reported  EKG: no significant arrhythmia  SUMMARY: This was an abnormal continuous video EEG due to generalized slowing, resolved burst-suppression, and emergence of sharply contoured GRDA. This was indicative of a non-specific encephalopathy pattern with some underlying cortical irritability. No discrete seizures were seen.

## 2018-06-23 NOTE — Progress Notes (Signed)
SLP Cancellation Note  Patient Details Name: MELQUISEDEC JOURNEY MRN: [id number] DOB: Jul 02, 1945   Cancelled treatment:       Reason Eval/Treat Not Completed: Medical issues which prohibited therapy. Pt remains intubated. Will follow up for cognitive-linguistic assessment when appropriate.  Deneise Lever, [place], CCC-SLP Speech-Language Pathologist Acute Rehabilitation Services Pager: [phone] Office: [phone]    Aliene Altes 06/23/2018, 8:06 AM

## 2018-06-23 NOTE — Progress Notes (Signed)
RT NOTE: RN called RT to bedside about ventilator. When RT arrived RN stated patients ETT was 20cm at the lip. RT advanced ETT back to 22cm with second RT. Bilateral breath sounds noted and vitals stable. RT will continue to monitor.

## 2018-06-23 NOTE — Progress Notes (Signed)
OT Cancellation Note  Patient Details Name: ERICH KOCHAN MRN: [id number] DOB: Jan 05, 1946   Cancelled Treatment:    Reason Eval/Treat Not Completed: Patient not medically ready(Pt intubated, sedated, and with elevated troponins.)Will return as schedule allows. Thank you  [place], OTR/L Acute Rehab Pager: [phone] Office: [phone] 06/23/2018, 7:42 AM

## 2018-06-23 NOTE — Progress Notes (Signed)
maint complete. Pt staying on LTM EEG

## 2018-06-24 ENCOUNTER — Inpatient Hospital Stay (HOSPITAL_COMMUNITY): Payer: PPO

## 2018-06-24 DIAGNOSIS — I5023 Acute on chronic systolic (congestive) heart failure: Secondary | ICD-10-CM

## 2018-06-24 DIAGNOSIS — I10 Essential (primary) hypertension: Secondary | ICD-10-CM

## 2018-06-24 LAB — POCT I-STAT 7, (LYTES, BLD GAS, ICA,H+H)
ACID-BASE EXCESS: 7 mmol/L — AB (ref 0.0–2.0)
Bicarbonate: 30.8 mmol/L — ABNORMAL HIGH (ref 20.0–28.0)
Calcium, Ion: 1.2 mmol/L (ref 1.15–1.40)
HEMATOCRIT: 40 % (ref 39.0–52.0)
Hemoglobin: 13.6 g/dL (ref 13.0–17.0)
O2 Saturation: 98 %
PO2 ART: 100 mmHg (ref 83.0–108.0)
Patient temperature: 97.7
Potassium: 3.1 mmol/L — ABNORMAL LOW (ref 3.5–5.1)
Sodium: 143 mmol/L (ref 135–145)
TCO2: 32 mmol/L (ref 22–32)
pCO2 arterial: 40.4 mmHg (ref 32.0–48.0)
pH, Arterial: 7.488 — ABNORMAL HIGH (ref 7.350–7.450)

## 2018-06-24 LAB — GLUCOSE, CAPILLARY
Glucose-Capillary: 116 mg/dL — ABNORMAL HIGH (ref 70–99)
Glucose-Capillary: 157 mg/dL — ABNORMAL HIGH (ref 70–99)
Glucose-Capillary: 91 mg/dL (ref 70–99)
Glucose-Capillary: 107 mg/dL — ABNORMAL HIGH (ref 70–99)
Glucose-Capillary: 102 mg/dL — ABNORMAL HIGH (ref 70–99)
Glucose-Capillary: 100 mg/dL — ABNORMAL HIGH (ref 70–99)

## 2018-06-24 LAB — MAGNESIUM
Magnesium: 2.1 mg/dL (ref 1.7–2.4)
Magnesium: 1.9 mg/dL (ref 1.7–2.4)

## 2018-06-24 LAB — VITAMIN B1: Vitamin B1 (Thiamine): 151.5 nmol/L (ref 66.5–200.0)

## 2018-06-24 MED ORDER — CARVEDILOL 12.5 MG PO TABS: 12.5000 mg | ORAL_TABLET | Freq: Two times a day (BID) | ORAL | Status: DC

## 2018-06-24 MED ORDER — CARVEDILOL 12.5 MG PO TABS
12.5000 mg | ORAL_TABLET | Freq: Two times a day (BID) | ORAL | Status: DC
  Administered 2018-06-25 – 2018-06-30 (×11): 12.5 mg via ORAL
  Filled 2018-06-24 (×11): qty 1

## 2018-06-24 MED ORDER — FUROSEMIDE 10 MG/ML IJ SOLN
INTRAMUSCULAR | Status: AC
  Filled 2018-06-24: qty 4

## 2018-06-24 MED ORDER — HYDROCORTISONE NA SUCCINATE PF 100 MG IJ SOLR
50.0000 mg | Freq: Every day | INTRAMUSCULAR | Status: DC
  Administered 2018-06-25: 50 mg via INTRAVENOUS
  Filled 2018-06-24: qty 2

## 2018-06-24 MED ORDER — METOPROLOL TARTRATE 50 MG PO TABS
50.0000 mg | ORAL_TABLET | Freq: Two times a day (BID) | ORAL | Status: DC
  Administered 2018-06-24: 50 mg via ORAL
  Filled 2018-06-24 (×2): qty 1

## 2018-06-24 MED ORDER — LABETALOL HCL 5 MG/ML IV SOLN
20.0000 mg | INTRAVENOUS | Status: DC | PRN
  Administered 2018-06-24 – 2018-06-26 (×9): 20 mg via INTRAVENOUS
  Filled 2018-06-24 (×8): qty 4

## 2018-06-24 MED ORDER — HYDRALAZINE HCL 20 MG/ML IJ SOLN
10.0000 mg | INTRAMUSCULAR | Status: DC | PRN
  Administered 2018-06-24: 10 mg via INTRAVENOUS
  Filled 2018-06-24: qty 1

## 2018-06-24 MED ORDER — LABETALOL HCL 5 MG/ML IV SOLN
10.0000 mg | INTRAVENOUS | Status: DC | PRN
  Administered 2018-06-24 (×2): 10 mg via INTRAVENOUS
  Filled 2018-06-24 (×2): qty 4

## 2018-06-24 MED ORDER — HYDRALAZINE HCL 20 MG/ML IJ SOLN
INTRAMUSCULAR | Status: AC
  Filled 2018-06-24: qty 1

## 2018-06-24 MED ORDER — HYDRALAZINE HCL 20 MG/ML IJ SOLN
20.0000 mg | Freq: Once | INTRAMUSCULAR | Status: AC
  Administered 2018-06-24: 20 mg via INTRAVENOUS

## 2018-06-24 MED ORDER — LEVETIRACETAM IN NACL 500 MG/100ML IV SOLN
500.0000 mg | Freq: Two times a day (BID) | INTRAVENOUS | Status: DC
  Administered 2018-06-24 – 2018-06-26 (×4): 500 mg via INTRAVENOUS
  Filled 2018-06-24 (×5): qty 100

## 2018-06-24 NOTE — Progress Notes (Signed)
Neurology Progress Note   S:// Seen and examined.  More awake. Off sedation  O:// Current vital signs: BP (!) 215/90   Pulse 100   Temp 100 F (37.8 C)   Resp (!) 21   Ht _0  (1.727 m)   Wt 106.7 kg   SpO2 97%   BMI 35.77 kg/m  Vital signs in last 24 hours: Temp:  [98.6 F (37 C)-100 F (37.8 C)] 100 F (37.8 C) (02/02 0900) Pulse Rate:  [64-119] 100 (02/02 0900) Resp:  [16-21] 21 (02/02 0900) BP: (136-218)/(56-101) 215/90 (02/02 0900) SpO2:  [96 %-100 %] 97 % (02/02 0904) FiO2 (%):  [40 %] 40 % (02/02 0722) Gen: awake, alert, agitated in bed HEENT: NCA T CVS: S1 S2+, rrr Chest clear Abdomen: Mildly distended, nontender Neurological exam Patient is awake, alert, very uncomfortable with the endotracheal tube. He is able to follow simple commands like giving me a thumbs up on the right but not so much on the left. He is able to move all 4 extremities spontaneously. He is also to move all 4 on command Cranials pupils equal round react to light extraocular movements intact, visual fields appear full, facial symmetry difficult accident. Motor exam: Moving all fours Sensory exam: Withdrawing all 4 to noxious simulation  Medications  Current Facility-Administered Medications:  .  acetaminophen (TYLENOL) tablet 650 mg, 650 mg, Oral, Q4H PRN, Simonne Maffucci B, MD, 650 mg at 06/24/18 0852 .  aspirin chewable tablet 81 mg, 81 mg, Per Tube, Daily, Desai, Rahul P, PA-C, 81 mg at 06/24/18 0852 .  chlorhexidine gluconate (MEDLINE KIT) (PERIDEX) 0.12 % solution 15 mL, 15 mL, Mouth Rinse, BID, McQuaid, Douglas B, MD, 15 mL at 06/24/18 0823 .  dexmedetomidine (PRECEDEX) 400 MCG/100ML (4 mcg/mL) infusion, 0.4-1.2 mcg/kg/hr, Intravenous, Titrated, Icard, Bradley L, DO, Last Rate: 26.7 mL/hr at 06/24/18 0826, 1 mcg/kg/hr at 06/24/18 0826 .  famotidine (PEPCID) 40 MG/5ML suspension 20 mg, 20 mg, Per Tube, BID, Simonne Maffucci B, MD, 20 mg at 06/24/18 0853 .  feeding supplement  (PRO-STAT SUGAR FREE 64) liquid 60 mL, 60 mL, Per Tube, TID, Mannam, Praveen, MD, 60 mL at 06/24/18 0853 .  feeding supplement (VITAL HIGH PROTEIN) liquid 1,000 mL, 1,000 mL, Per Tube, Q24H, Mannam, Praveen, MD, 1,000 mL at 06/23/18 2034 .  fentaNYL (SUBLIMAZE) injection 100 mcg, 100 mcg, Intravenous, Q15 min PRN, Simonne Maffucci B, MD .  fentaNYL (SUBLIMAZE) injection 100 mcg, 100 mcg, Intravenous, Q2H PRN, Simonne Maffucci B, MD, 100 mcg at 06/24/18 0934 .  furosemide (LASIX) injection 20 mg, 20 mg, Intravenous, Q8H, Icard, Bradley L, DO, 20 mg at 06/24/18 0512 .  heparin injection 5,000 Units, 5,000 Units, Subcutaneous, Q8H, Desai, Rahul P, PA-C, 5,000 Units at 06/24/18 0506 .  hydrALAZINE (APRESOLINE) injection 10 mg, 10 mg, Intravenous, Q4H PRN, Anders Simmonds, MD, 10 mg at 06/24/18 0708 .  hydrocortisone sodium succinate (SOLU-CORTEF) 100 MG injection 50 mg, 50 mg, Intravenous, Q6H, Rush Farmer, MD, 50 mg at 06/24/18 0854 .  ipratropium-albuterol (DUONEB) 0.5-2.5 (3) MG/3ML nebulizer solution 3 mL, 3 mL, Nebulization, Q6H, Icard, Bradley L, DO, 3 mL at 06/24/18 0718 .  lacosamide (VIMPAT) 100 mg in sodium chloride 0.9 % 25 mL IVPB, 100 mg, Intravenous, Q12H, Amie Portland, MD, Stopped at 06/23/18 2245 .  levETIRAcetam (KEPPRA) 100 MG/ML solution 500 mg, 500 mg, Per Tube, BID, Simonne Maffucci B, MD, 500 mg at 06/24/18 0853 .  MEDLINE mouth rinse, 15 mL, Mouth Rinse, 10 times  per day, Juanito Doom, MD, 15 mL at 06/24/18 1610 .  metoprolol tartrate (LOPRESSOR) tablet 50 mg, 50 mg, Oral, BID, Anders Simmonds, MD, 50 mg at 06/24/18 0537 .  midazolam (VERSED) injection 1 mg, 1 mg, Intravenous, Q2H PRN, Judd Lien, MD, 1 mg at 06/24/18 0803 .  multivitamin with minerals tablet 1 tablet, 1 tablet, Per Tube, Daily, Mannam, Praveen, MD, 1 tablet at 06/24/18 0853 .  ondansetron (ZOFRAN) injection 4 mg, 4 mg, Intravenous, Q6H PRN, Simonne Maffucci B, MD .  phenytoin (DILANTIN) 125 mg  in sodium chloride 0.9 % 100 mL IVPB, 125 mg, Intravenous, Q8H, Romona Curls, RPH, Stopped at 06/24/18 9604 .  rosuvastatin (CRESTOR) tablet 10 mg, 10 mg, Per Tube, q1800, Desai, Rahul P, PA-C, 10 mg at 06/23/18 1719 .  sodium chloride flush (NS) 0.9 % injection 10-40 mL, 10-40 mL, Intracatheter, PRN, Juanito Doom, MD Labs CBC    Component Value Date/Time   WBC 9.5 06/23/2018 0905   RBC 4.25 06/23/2018 0905   HGB 12.6 (L) 06/23/2018 1230   HCT 37.0 (L) 06/23/2018 1230   PLT 168 06/23/2018 0905   MCV 95.8 06/23/2018 0905   MCH 30.8 06/23/2018 0905   MCHC 32.2 06/23/2018 0905   RDW 13.1 06/23/2018 0905   LYMPHSABS 4.4 (H) 06/20/2018 0928   MONOABS 1.2 (H) 06/20/2018 0928   EOSABS 0.8 (H) 06/20/2018 0928   BASOSABS 0.1 06/20/2018 0928    CMP     Component Value Date/Time   NA 142 06/23/2018 1230   K 3.8 06/23/2018 1230   CL 118 (H) 06/23/2018 0626   CO2 17 (L) 06/23/2018 0626   GLUCOSE 101 (H) 06/23/2018 0626   BUN 27 (H) 06/23/2018 0626   CREATININE 1.26 (H) 06/23/2018 0626   CALCIUM 8.1 (L) 06/23/2018 0626   PROT 5.8 (L) 06/20/2018 1844   ALBUMIN 3.3 (L) 06/20/2018 1844   AST 45 (H) 06/20/2018 1844   ALT 55 (H) 06/20/2018 1844   ALKPHOS 40 06/20/2018 1844   BILITOT 0.8 06/20/2018 1844   GFRNONAA 57 (L) 06/23/2018 0626   GFRAA >60 06/23/2018 0626   LTM EEG with abnormal continuous stimulus induced generalized rhythmic delta or GPD indicative of toxic metabolic encephalopathy.  No epileptiform pattern.  Imaging I have reviewed images in epic and the results pertinent to this consultation are: MRI brain with old right MCA stroke in 2 new punctate strokes in the occipital lobe on the left.  Assessment: 73 year old man with known history of right carotid stenosis and old by moving right parietal infarct presenting with new onset seizure.   Ajit on arrival and intubated for airway protection.  Concern for question seizure-like activity due to some respiratory  synchrony on ventilator.  Maintained on Keppra and Dilantin.  Today he is much more awake alert oriented and following some commands. LP was done due to concern for CNS infection and has been unremarkable.  Impression New onset seizure likely from the old right MCA stroke.  Possible status epilepticus that has resolved. New embolic-looking punctate infarcts in the left occipital lobe. Known right carotid stenosis status post CEA  Recommendations: Seizure Continue Keppra and Dilantin and Vimpat at current doses Extubate when okay with primary team Minimize sedation as much as possible. Discontinue LTM  Stroke Likely cardioembolic due to deranged cardiac function diastolic heart failure V4U 5.3 Continue aspirin  Systolic heart failure and troponinemia Management per CCM  Neurology will follow with you.  -- Amie Portland, MD  [facility] Pager: [phone] If 7pm to 7am, please call on call as listed on AMION.  CRITICAL CARE ATTESTATION Performed by: Amie Portland, MD Total critical care time: 50 minutes Critical care time was exclusive of separately billable procedures and treating other patients and/or supervising APPs/Residents/Students Critical care was necessary to treat or prevent imminent or life-threatening deterioration due to seizures, status epilepticus, cute ischemic stroke This patient is critically ill and at significant risk for neurological worsening and/or death and care requires constant monitoring. Critical care was time spent personally by me on the following activities: development of treatment plan with patient and/or surrogate as well as nursing, discussions with consultants, evaluation of patient's response to treatment, examination of patient, obtaining history from patient or surrogate, ordering and performing treatments and interventions, ordering and review of laboratory studies, ordering and review of radiographic studies, pulse oximetry,  re-evaluation of patient's condition, participation in multidisciplinary rounds and medical decision making of high complexity in the care of this patient.

## 2018-06-24 NOTE — Progress Notes (Signed)
vLTM EEG complete. No skin breakdown 

## 2018-06-24 NOTE — Evaluation (Signed)
Physical Therapy Evaluation Patient Details Name: Jeremy [name] MRN: [id number] DOB: 07/11/1945 Today's Date: 06/24/2018   History of Present Illness  73 year old male presenting with seizure-like episode, acute loss of consciousness followed by severe combativeness. Intubated 1/29-2/2. MRI of the brain shows subcentimeter left occipital areas of restricted diffusion. PMH including HTN, coronary artery disease, hypercholesterolemia, gastroesophageal reflux disease, and carotid artery stenosis.   Clinical Impression  Pt admitted with/for seizure.  Pt presently needing moderate to max assist of 2 persons for mobility.  Pt currently limited functionally due to the problems listed. ( See problems list.)   Pt will benefit from PT to maximize function and safety in order to get ready for next venue listed below.     Follow Up Recommendations CIR    Equipment Recommendations  Other (comment)(TBA)    Recommendations for Other Services Rehab consult     Precautions / Restrictions Precautions Precautions: Fall      Mobility  Bed Mobility Overal bed mobility: Needs Assistance Bed Mobility: Supine to Sit;Sit to Supine     Supine to sit: Mod assist;+2 for physical assistance;HOB elevated Sit to supine: +2 for safety/equipment;+2 for physical assistance;Total assist   General bed mobility comments: Mod A +2 to faciltiate BLEs over EOB and elevate trunk. Requiring Total A +2 to bring BLEs over EOB and then safely lower trunk  Transfers Overall transfer level: Needs assistance Equipment used: 2 person hand held assist Transfers: Sit to/from Stand Sit to Stand: Total assist;+2 physical assistance;From elevated surface;+2 safety/equipment         General transfer comment: Pt requiring Total A +2 to power up into standing. Pt requiring blocking of bilateral knees and presents with significant weakness.   Ambulation/Gait             General Gait Details: Unable today.  Unable to  sidestep toward [place]            Wheelchair Mobility    Modified Rankin (Stroke Patients Only)       Balance Overall balance assessment: Needs assistance Sitting-balance support: No upper extremity supported;Feet supported Sitting balance-Leahy Scale: Fair Sitting balance - Comments: Able to maintain static sitting balance   Standing balance support: No upper extremity supported;During functional activity Standing balance-Leahy Scale: Zero Standing balance comment: Requiring Total A                             Pertinent Vitals/Pain Pain Assessment: Faces Faces Pain Scale: Hurts even more Pain Location: Generalized Pain Descriptors / Indicators: Constant;Grimacing Pain Intervention(s): Monitored during session    Home Living Family/patient expects to be discharged to:: Private residence Living Arrangements: Spouse/significant other Available Help at Discharge: Family Type of Home: House Home Access: Stairs to enter Entrance Stairs-Rails: Right Entrance Stairs-Number of Steps: 2 Home Layout: One level Home Equipment: Walker - 2 wheels Additional Comments: Pt answering yes/no questions and requires significant amount of time. No family present to confirm    Prior Function Level of Independence: Independent         Comments: Pt reporting ADLs, IADLs, drives. Pt answering yes/no questions and requires significant amount of time. No family present to confirm     Hand Dominance   Dominant Hand: Left    Extremity/Trunk Assessment   Upper Extremity Assessment Upper Extremity Assessment: RUE deficits/detail;LUE deficits/detail;Difficult to assess due to impaired cognition RUE Deficits / Details: Pt with poor grasp strength ang initate reach with RUE  but no purposeful movement noted. Edema in hands RUE Coordination: decreased gross motor;decreased fine motor LUE Deficits / Details: Pt with tendency to lean on his LUE for balance at EOB. Poor  grasp strength. Pt moving LUE quickly for balance, but no purposeful movement to commands. LUE Coordination: decreased fine motor;decreased gross motor    Lower Extremity Assessment Lower Extremity Assessment: Generalized weakness(pt with difficulty bearing weight)    Cervical / Trunk Assessment Cervical / Trunk Assessment: Other exceptions Cervical / Trunk Exceptions: Lateral push of hips to left  Communication   Communication: Other (comment)(Pt nodding his head for yes/no questions)  Cognition Arousal/Alertness: Awake/alert Behavior During Therapy: Restless Overall Cognitive Status: Difficult to assess Area of Impairment: Following commands                       Following Commands: Follows one step commands inconsistently;Follows one step commands with increased time       General Comments: Due to decreased communication and restlessness, difficult to assess cognition. Pt requiring significant amount of time for processing thorughout session      General Comments General comments (skin integrity, edema, etc.): VSS. HR in 90s and SpO2 in 90s on 6L throughout session    Exercises     Assessment/Plan    PT Assessment Patient needs continued PT services  PT Problem List Decreased strength;Decreased activity tolerance;Decreased balance;Decreased mobility;Decreased coordination;Decreased cognition       PT Treatment Interventions DME instruction;Gait training;Functional mobility training;Therapeutic activities;Balance training;Neuromuscular re-education;Patient/family education    PT Goals (Current goals can be found in the Care Plan section)  Acute Rehab PT Goals Patient Stated Goal: Unstated PT Goal Formulation: Patient unable to participate in goal setting Time For Goal Achievement: 07/08/18 Potential to Achieve Goals: Good    Frequency Min 3X/week   Barriers to discharge        Co-evaluation PT/OT/SLP Co-Evaluation/Treatment: Yes Reason for  Co-Treatment: For patient/therapist safety PT goals addressed during session: Mobility/safety with mobility OT goals addressed during session: ADL's and self-care       AM-PAC PT "6 Clicks" Mobility  Outcome Measure Help needed turning from your back to your side while in a flat bed without using bedrails?: A Lot Help needed moving from lying on your back to sitting on the side of a flat bed without using bedrails?: A Lot Help needed moving to and from a bed to a chair (including a wheelchair)?: A Lot Help needed standing up from a chair using your arms (e.g., wheelchair or bedside chair)?: A Lot Help needed to walk in hospital room?: Total Help needed climbing 3-5 steps with a railing? : Total 6 Click Score: 10    End of Session   Activity Tolerance: Patient tolerated treatment well Patient left: in bed;with call bell/phone within reach;with bed alarm set;with SCD's reapplied Nurse Communication: Mobility status PT Visit Diagnosis: Muscle weakness (generalized) (M62.81);Other abnormalities of gait and mobility (R26.89)    Time: 1410-1438 PT Time Calculation (min) (ACUTE ONLY): 28 min   Charges:   PT Evaluation $PT Eval Moderate Complexity: 1 Mod          06/24/2018  Donnella Sham, PT Acute Rehabilitation Services [phone]  (pager) [phone]  (office)  Tessie Fass Adaysha Dubinsky 06/24/2018, 4:16 PM

## 2018-06-24 NOTE — Progress Notes (Signed)
NAME:  Jeremy [name], MRN:  [id number], DOB:  Mar 08, 1946, LOS: 4 ADMISSION DATE:  06/20/2018, CONSULTATION DATE: June 20, 2018 REFERRING MD: Dr. Tomi Bamberger , CHIEF COMPLAINT: Acute loss of consciousness  Brief History   73 year old male with a past medical history significant for hypertension, cerebrovascular disease was in his usual state of health on June 20, 2018 when he suddenly had a seizure-like episode at [employer].  Was intubated for airway protection in the setting of severe combativeness.  History of present illness   This is a pleasant 73 year old male who came to our facility on June 20, 2018 in the setting of seizure-like episode, acute loss of consciousness followed by severe combativeness.  His family provides history because he was intubated on my arrival.  They state that he has been in his usual state of health.  Apparently had carotid artery surgery several months ago.  At that time he was noted to have some degree of cerebral artery stenosis.  He was awaiting neurology consultation but that had not happened yet.  In the interim since September when he last had his surgery he was complaining of significant headache on a regular basis.  They said that this has been happening off and on.  They denied any sort of alcohol intake, he does not use drugs, he does not smoke cigarettes.  They denied a history of diabetes.  At the [facility] emergency room he required intubation for airway protection.  He had no seizure activity there.  He was noted to have an elevated ammonia.  Past Medical History  Hypertension, coronary artery disease, hypercholesterolemia, gastroesophageal reflux disease, carotid artery stenosis,  Significant Hospital Events   June 20, 2018 admission  Consults:  Neurology  Procedures:  Lumbar puncture January 29: No pleocytosis  ECHO 1/31: EF 35 to 40%, mild MR, mild TR  Significant Diagnostic Tests:  CT head June 20, 2018 showed no acute  intracranial abnormality, chronic right temporoparietal infarct which had been unchanged, cervical spondylosis CT angiogram chest June 20, 2018 no pulmonary embolism, bibasilar atelectasis, some aortic atherosclerosis noted MRI brain 1/29 > acute / early subacute infarction in left occipital lobe.  No hemorrhage or mass effect. RUQ [place] 1/30 > hepatic steatosis. EEG 1/30 > generalized encephalopathy. Echo 1/30 >  EEG 1/30 > diffuse disturbance, no epileptiform activity.  Micro Data:  CSF culture 1/29 >   Antimicrobials:    Interim history/subjective:  Good urine output overnight.  Family at bedside updated.  Following all basal commands this morning on mechanical ventilation.  Objective   Blood pressure (!) 204/189, pulse (!) 111, temperature (!) 100.4 F (38 C), resp. rate (!) 22, height 5\' 8"  (1.727 m), weight 106.7 kg, SpO2 95 %.    Vent Mode: CPAP;PSV FiO2 (%):  [40 %] 40 % Set Rate:  [16 bmp] 16 bmp Vt Set:  [540 mL] 540 mL PEEP:  [5 cmH20-10 cmH20] 5 cmH20 Pressure Support:  [10 cmH20] 10 cmH20 Plateau Pressure:  [14 cmH20-21 cmH20] 14 cmH20   Intake/Output Summary (Last 24 hours) at 06/24/2018 1112 Last data filed at 06/24/2018 1049 Gross per 24 hour  Intake 1189.43 ml  Output 7900 ml  Net -6710.57 ml   Filed Weights   06/20/18 1103 06/21/18 0500 06/22/18 0500  Weight: 97.5 kg 97 kg 106.7 kg    Examination: General appearance: 73 year old gentleman intubated on mechanical ventilation Eyes: Sclera anicteric, moist conjunctivo-, pupils reactive, tracking appropriately HENT: CAT, large neck Neck: Trachea midline, no JVD,  endotracheal tube in place Lungs: Lateral ventilated breath sounds CV: Echocardiac, regular, S1-S2, distant heart tones Chest: Large barrel chest Abdomen: Soft, nontender, nondistended, bowel sounds present Extremities: Bilateral lower extremity dependent edema Skin: Normal temperature, no rash Neuro: On Precedex, follows all basic commands  answers yes no nods head, wiggles toes and fingers.   Assessment & Plan:   Acute respiratory failure with hypoxemia due to inability to protect airway, intubated on mechanical ventilation Patient has done very well since changes made yesterday with a mechanical ventilator as well as ongoing diuresis out -4 L. At this point I think he is doing well to pass his spontaneous breathing trial and would benefit from liberation from mechanical ventilator. Orders placed for extubation  Acute onset seizure activity - presumed due to left occipital early infarct.  EEG negative for epileptiform activity. Continue Keppra, phenytoin and Vimpat per neurology AEDs managed by neurology service.  Left occipital CVA. Stroke work-up per neurology  Acute on mild CKD. Continue to follow We will hold off on institution of ACE inhibitor this time  Acute on chronic systolic heart failure Positive cumulative fluid balance Troponin elevation, likely related to volume overload and heart failure exacerbation We will continue diuresis and have patient seen by cardiology at some point. Will start goal-directed heart failure regimen Continue diuresis and started Coreg 12.5 twice daily  Acute metabolic toxic encephalopathy, acute delirium Likely related to all the medications and post seizure on admission. Stable at this time we will continue to follow  Bradycardia -stable.  Hx HTN, HLD, CAD. Continue aspirin and rosuvastatin Continue diuresis and restart low-dose beta-blockade   Best practice:  Diet: N.p.o.  Start TF's. Pain/Anxiety/Delirium protocol (if indicated): Yes, as above VAP protocol (if indicated): Yes DVT prophylaxis: SCD / heparin. GI prophylaxis: Famotidine Glucose control: None, SSI if glucose consistently > 180 Mobility: Bedrest Code Status: Full Family Communication: Son, daughter in law, and fiance updated at bedside 1/30. Disposition: ICU  Plans for liberation from mechanical  ventilation today.  Garner Nash, DO [facility] 06/24/2018 11:12 AM  Personal pager: [phone] If unanswered, please page CCM On-call: [phone]

## 2018-06-24 NOTE — Procedures (Signed)
Extubation Procedure Note  Patient Details:   Name: Jeremy [name] DOB: 07/23/1945 MRN: [id number]   Airway Documentation:    Vent end date: (not recorded) Vent end time: (not recorded)   Evaluation  O2 sats: stable throughout Complications: No apparent complications Patient did tolerate procedure well. Bilateral Breath Sounds: Clear, Diminished   Yes   RT extubated patient to 2L [place] with RN at bedside per MD order. Positive cuff leak noted. Patient tolerating well and says breathing feels comfortable. No stridor noted. RT will continue to monitor as needed.   Vernona Rieger 06/24/2018, 11:16 AM

## 2018-06-24 NOTE — Evaluation (Addendum)
Occupational Therapy Evaluation Patient Details Name: Jeremy [name] MRN: [id number] DOB: 06/27/1945 Today's Date: 06/24/2018    History of Present Illness 73 year old male presenting with seizure-like episode, acute loss of consciousness followed by severe combativeness. Intubated 1/29-2/2. MRI of the brain shows subcentimeter left occipital areas of restricted diffusion. PMH including HTN, coronary artery disease, hypercholesterolemia, gastroesophageal reflux disease, and carotid artery stenosis.     Clinical Impression   PTA, pt was independent and lived with his wife. Pt reporting PLOF and home information by answering yes/no questions (with head nods); no family present to confirm information. Pt currently required Max-Total A for ADLs and functional transfers. Pt highly motivated to participate in therapy and benefits from increased time for processing. Pt presenting with decreased strength, coordination, balance, functional use of BUEs, and cognition. Pt denies any diplopia or blurry vision; however, pt with difficulty tracking and maintaining visual focus. VSS throughout on 6L O2. Pt will require further acute OT to facilitate safe dc. Recommend dc to CIR for intensive OT to optimize safety, independence with ADLs, and return to PLOF.      Follow Up Recommendations  CIR;Supervision/Assistance - 24 hour    Equipment Recommendations  Other (comment)(Defer to next venue)    Recommendations for Other Services Rehab consult;PT consult;Speech consult     Precautions / Restrictions Precautions Precautions: Fall      Mobility Bed Mobility Overal bed mobility: Needs Assistance Bed Mobility: Supine to Sit;Sit to Supine     Supine to sit: Mod assist;+2 for physical assistance;HOB elevated Sit to supine: +2 for safety/equipment;+2 for physical assistance;Total assist   General bed mobility comments: Mod A +2 to faciltiate BLEs over EOB and elevate trunk. Requiring Total A +2 to bring  BLEs over EOB and then safely lower trunk  Transfers Overall transfer level: Needs assistance Equipment used: 2 person hand held assist Transfers: Sit to/from Stand Sit to Stand: Total assist;+2 physical assistance;From elevated surface;+2 safety/equipment         General transfer comment: Pt requiring Total A +2 to power up into standing. Pt requiring blocking of bilateral knees and presents with significant weakness.     Balance Overall balance assessment: Needs assistance Sitting-balance support: No upper extremity supported;Feet supported Sitting balance-Leahy Scale: Fair Sitting balance - Comments: Able to maintain static sitting balance   Standing balance support: No upper extremity supported;During functional activity Standing balance-Leahy Scale: Zero Standing balance comment: Requiring Total A                           ADL either performed or assessed with clinical judgement   ADL Overall ADL's : Needs assistance/impaired                                       General ADL Comments: Due to cognition, weakness, and poor balance. Pt requiring Max-Total A for ADLs. Pt eager to participate in therapy. However, with poor activity tolerance and strength     Vision Baseline Vision/History: Wears glasses Wears Glasses: Reading only Patient Visual Report: No change from baseline Additional Comments: Pt denies any blurry or double vision. However, pt with poor tracking. Will continue to assess     Perception     Praxis      Pertinent Vitals/Pain Pain Assessment: Faces Faces Pain Scale: Hurts even more Pain Location: Generalized Pain Descriptors / Indicators: Constant;Grimacing Pain  Intervention(s): Monitored during session;Repositioned     Hand Dominance Left   Extremity/Trunk Assessment Upper Extremity Assessment Upper Extremity Assessment: RUE deficits/detail;LUE deficits/detail;Difficult to assess due to impaired cognition RUE  Deficits / Details: Pt with poor grasp strength ang initate reach with RUE but no purposeful movement noted. Edema in hands RUE Coordination: decreased gross motor;decreased fine motor LUE Deficits / Details: Pt with tendency to lean on his LUE for balance at EOB. Poor grasp strength. Pt moving LUE quickly for balance, but no purposeful movement to commands. LUE Coordination: decreased fine motor;decreased gross motor   Lower Extremity Assessment Lower Extremity Assessment: Defer to PT evaluation   Cervical / Trunk Assessment Cervical / Trunk Assessment: Other exceptions Cervical / Trunk Exceptions: Lateral push of hips to left   Communication Communication Communication: Other (comment)(Pt nodding his head for yes/no questions)   Cognition Arousal/Alertness: Awake/alert Behavior During Therapy: Restless Overall Cognitive Status: Difficult to assess Area of Impairment: Following commands                       Following Commands: Follows one step commands inconsistently;Follows one step commands with increased time       General Comments: Due to decreased communication and restlessness, difficult to assess cognition. Pt requiring significant amount of time for processing thorughout session   General Comments  VSS. HR in 90s and SpO2 in 90s on 6L throughout session    Exercises     Shoulder Instructions      Home Living Family/patient expects to be discharged to:: Private residence Living Arrangements: Spouse/significant other Available Help at Discharge: Family Type of Home: House Home Access: Stairs to enter Technical brewer of Steps: 2 Entrance Stairs-Rails: Right Home Layout: One level               Home Equipment: [place] - 2 wheels   Additional Comments: Pt answering yes/no questions and requires significant amount of time. No family present to confirm      Prior Functioning/Environment Level of Independence: Independent        Comments:  Pt reporting ADLs, IADLs, drives. Pt answering yes/no questions and requires significant amount of time. No family present to confirm        OT Problem List: Decreased strength;Decreased range of motion;Decreased activity tolerance;Impaired balance (sitting and/or standing);Decreased cognition;Decreased knowledge of use of DME or AE;Decreased knowledge of precautions;Pain;Impaired UE functional use;Increased edema      OT Treatment/Interventions: Self-care/ADL training;Therapeutic exercise;Energy conservation;DME and/or AE instruction;Therapeutic activities;Patient/family education    OT Goals(Current goals can be found in the care plan section) Acute Rehab OT Goals Patient Stated Goal: Unstated OT Goal Formulation: Patient unable to participate in goal setting Time For Goal Achievement: 07/08/18 Potential to Achieve Goals: Good  OT Frequency: Min 2X/week   Barriers to D/C:            Co-evaluation PT/OT/SLP Co-Evaluation/Treatment: Yes Reason for Co-Treatment: For patient/therapist safety;To address functional/ADL transfers;Complexity of the patient's impairments (multi-system involvement)   OT goals addressed during session: ADL's and self-care      AM-PAC OT "6 Clicks" Daily Activity     Outcome Measure Help from another person eating meals?: Total Help from another person taking care of personal grooming?: A Lot Help from another person toileting, which includes using toliet, bedpan, or urinal?: Total Help from another person bathing (including washing, rinsing, drying)?: Total Help from another person to put on and taking off regular upper body clothing?: A Lot Help from another  person to put on and taking off regular lower body clothing?: Total 6 Click Score: 8   End of Session Equipment Utilized During Treatment: Oxygen(6L) Nurse Communication: Mobility status;Other (comment)(Pt on bedpan)  Activity Tolerance: Patient limited by lethargy;Patient limited by  fatigue Patient left: in bed;with call bell/phone within reach;with bed alarm set  OT Visit Diagnosis: Unsteadiness on feet (R26.81);Other abnormalities of gait and mobility (R26.89);Muscle weakness (generalized) (M62.81);Pain;Other symptoms and signs involving cognitive function Pain - part of body: (Generalized)                Time: 1410-1438 OT Time Calculation (min): 28 min Charges:  OT General Charges $OT Visit: 1 Visit OT Evaluation $OT Eval Moderate Complexity: [place], OTR/L Acute Rehab Pager: [phone] Office: [place] 06/24/2018, 3:40 PM

## 2018-06-24 NOTE — Progress Notes (Signed)
[place] Progress Note Patient Name: Jeremy [name] DOB: 02/09/1946 MRN: [id number]   Date of Service  06/24/2018  HPI/Events of Note  Hypertension - BP = 200/85 and HR = 70. Patient on Metoprolol PO, Vasotec PO and Cozaar PO at home.    eICU Interventions  Will order: 1. Metoprolol 50 mg PO now and BID. 2. Hydralazine 10 mg IV Q 4 hours PRN SBP > 170 or DBP > 100.     Intervention Category Major Interventions: Hypertension - evaluation and management  Mateusz Neilan Eugene 06/24/2018, 5:20 AM

## 2018-06-24 NOTE — Plan of Care (Signed)
  Problem: Clinical Measurements: Goal: Ability to maintain clinical measurements within normal limits will improve Outcome: Progressing   

## 2018-06-24 NOTE — Procedures (Signed)
LTM-EEG Report  HISTORY: Continuous video-EEG monitoring performed for 73 year old with altered mental status, possible seizures.  ACQUISITION: International 10-20 system for electrode placement; 18 channels with additional eyes linked to ipsilateral ears and EKG. Additional T1-T2 electrodes were used. Continuous video recording obtained.   EEG NUMBER:  MEDICATIONS:  Day 3: see EMR    DAY #3: from 0730 06/23/18 to 0730 06/24/18   BACKGROUND: This was medium voltage, continuous recording with some spontaneous variability and reactivity. The background consisted of medium voltage theta-delta activity diffusely without a posterior basic rhythm. There were atypical arousal patterns with increased slowing and some periodic activity.  EPILEPTIFORM/PERIODIC ACTIVITY: There were stimulus-induced runs of GRDA or GPDs with 1Hz  frequency. These showed no clinical correlate or ictal evolution and were completely resolved with rest. SEIZURES: none EVENTS: some events of agitation during arousal without EEG correlate  EKG: no significant arrhythmia  SUMMARY: This was an abnormal continuous video EEG due to generalized slowing and stimulus-induced GRDA or GPDs. This was indicative of a toxic-metabolic encephalopathy pattern. There were no definite epileptiform discharges or seizures.

## 2018-06-24 NOTE — Progress Notes (Signed)
RT NOTE: RT arrived to patient's room and noticed patient off bipap. MD said no need for ABG. RT will continue to monitor as needed

## 2018-06-25 ENCOUNTER — Inpatient Hospital Stay (HOSPITAL_COMMUNITY): Payer: PPO

## 2018-06-25 DIAGNOSIS — I639 Cerebral infarction, unspecified: Secondary | ICD-10-CM

## 2018-06-25 DIAGNOSIS — J9601 Acute respiratory failure with hypoxia: Secondary | ICD-10-CM

## 2018-06-25 LAB — GLUCOSE, CAPILLARY
Glucose-Capillary: 97 mg/dL (ref 70–99)
Glucose-Capillary: 101 mg/dL — ABNORMAL HIGH (ref 70–99)
Glucose-Capillary: 101 mg/dL — ABNORMAL HIGH (ref 70–99)
Glucose-Capillary: 111 mg/dL — ABNORMAL HIGH (ref 70–99)

## 2018-06-25 LAB — BASIC METABOLIC PANEL
Anion gap: 14 (ref 5–15)
BUN: 32 mg/dL — ABNORMAL HIGH (ref 8–23)
CHLORIDE: 105 mmol/L (ref 98–111)
CO2: 27 mmol/L (ref 22–32)
Calcium: 9.2 mg/dL (ref 8.9–10.3)
Creatinine, Ser: 1.14 mg/dL (ref 0.61–1.24)
GFR calc Af Amer: >60 mL/min (ref >60)
GFR calc non Af Amer: >60 mL/min (ref >60)
Glucose, Bld: 103 mg/dL — ABNORMAL HIGH (ref 70–99)
Potassium: 3.2 mmol/L — ABNORMAL LOW (ref 3.5–5.1)
Sodium: 146 mmol/L — ABNORMAL HIGH (ref 135–145)

## 2018-06-25 LAB — MAGNESIUM: Magnesium: 2.1 mg/dL (ref 1.7–2.4)

## 2018-06-25 LAB — CBC
HCT: 45.7 % (ref 39.0–52.0)
Hemoglobin: 15.1 g/dL (ref 13.0–17.0)
MCH: 30.3 pg (ref 26.0–34.0)
MCHC: 33 g/dL (ref 30.0–36.0)
MCV: 91.8 fL (ref 80.0–100.0)
Platelets: 227 10*3/uL (ref 150–400)
RBC: 4.98 MIL/uL (ref 4.22–5.81)
RDW: 12.9 % (ref 11.5–15.5)
WBC: 9.8 10*3/uL (ref 4.0–10.5)
nRBC: 0 % (ref 0.0–0.2)

## 2018-06-25 LAB — PHOSPHORUS: Phosphorus: 2.7 mg/dL (ref 2.5–4.6)

## 2018-06-25 MED ORDER — POLYVINYL ALCOHOL 1.4 % OP SOLN
1.0000 [drp] | OPHTHALMIC | Status: DC | PRN
  Administered 2018-06-25 – 2018-06-26 (×2): 1 [drp] via OPHTHALMIC
  Filled 2018-06-25: qty 15

## 2018-06-25 MED ORDER — RESOURCE THICKENUP CLEAR PO POWD
ORAL | Status: DC | PRN
  Filled 2018-06-25 (×2): qty 125

## 2018-06-25 MED ORDER — GABAPENTIN 250 MG/5ML PO SOLN
300.0000 mg | Freq: Every day | ORAL | Status: DC
  Administered 2018-06-26 – 2018-06-27 (×2): 300 mg
  Filled 2018-06-25 (×6): qty 6

## 2018-06-25 MED ORDER — IBUPROFEN 100 MG/5ML PO SUSP
400.0000 mg | Freq: Four times a day (QID) | ORAL | Status: DC | PRN
  Administered 2018-06-25 – 2018-06-26 (×2): 400 mg via ORAL
  Filled 2018-06-25 (×3): qty 20

## 2018-06-25 NOTE — Progress Notes (Signed)
[facility] Progress Note Patient Name: Jeremy [name] DOB: 06-11-1945 MRN: [id number]   Date of Service  06/25/2018  HPI/Events of Note  Patient c/o back pain - Patient on Neurontin and NSAID at home. Patient unable to take PO post extubation d/t tenuous respiratory status. AST and ALT both elevated. Creatinine = 1.26.   eICU Interventions  Will order: 1. Replace NG/OG tube.  2. Neurontin solution 300 mg per tube Q HS.  3. Motrin suspension 400 mg per tube Q 6 hours PRN pain. 4. D/C Tylenol.     Intervention Category Intermediate Interventions: Pain - evaluation and management  Makynlee Kressin [place] 06/25/2018, 12:25 AM

## 2018-06-25 NOTE — Progress Notes (Signed)
Modified Barium Swallow Progress Note  Patient Details  Name: Jeremy [name] MRN: [id number] Date of Birth: September 02, 1945  Today's Date: 06/25/2018  Modified Barium Swallow completed.  Full report located under Chart Review in the Imaging Section.  Brief recommendations include the following:  Clinical Impression  Pt exhibited mild-moderate oropharyngeal dysphagia with aspiration and penetration of nectar and honey thick boluses. Oral phase marked by mildly decreased cohesion and coordination of transit with thinner boluses. Pt's ability to elevate larynx and deflect epiglottis to protect airway is adequate however timing to initiate protective mechanisms was suboptimal. Nectar barium was silently aspirated during the swallow and honey thick substantially penetrated despite chin tuck head position, modification of bolus size or presentation. Recommend pt initiate Dys 1 (puree) consistency and nothing thinner than puree (liquids thickened to pudding consistency), alert for po's, upright, small bites and throat clear intermittently.        Swallow Evaluation Recommendations       SLP Diet Recommendations: Pudding thick liquid;Dysphagia 1 (Puree) solids       Medication Administration: Crushed with puree   Supervision: Patient able to self feed;Full supervision/cueing for compensatory strategies   Compensations: Slow rate;Small sips/bites;Clear throat intermittently   Postural Changes: Seated upright at 90 degrees   Oral Care Recommendations: Oral care BID   Other Recommendations: Order thickener from pharmacy    Houston Siren 06/25/2018,2:57 PM   Orbie Pyo [place].Ed Risk analyst [phone] Office [phone]

## 2018-06-25 NOTE — Evaluation (Signed)
Clinical/Bedside Swallow Evaluation Patient Details  Name: Jeremy [name] MRN: [id number] Date of Birth: 02-24-1946  Today's Date: 06/25/2018 Time: SLP Start Time (ACUTE ONLY): 1002 SLP Stop Time (ACUTE ONLY): 1019 SLP Time Calculation (min) (ACUTE ONLY): 17 min  Past Medical History:  Past Medical History:  Diagnosis Date  . Arthritis   . Carotid stenosis, asymptomatic, right   . Carpal tunnel syndrome    bilateral  . Coronary artery disease   . GERD (gastroesophageal reflux disease)   . History of kidney stones   . Hx of colonic polyp   . Hypercholesterolemia   . Hypertension   . Myocardial infarction ([place])    hx of  . Obesity    Past Surgical History:  Past Surgical History:  Procedure Laterality Date  . CARDIAC CATHETERIZATION    . CORONARY STENT PLACEMENT  2000   x3   . ENDARTERECTOMY Right 12/25/2017   Procedure: ENDARTERECTOMY CAROTID RIGHT;  Surgeon: Rosetta Posner, MD;  Location: [facility];  Service: Vascular;  Laterality: Right;  . PATCH ANGIOPLASTY Right 12/25/2017   Procedure: PATCH ANGIOPLASTY RIGHT CAROTID ARTERY;  Surgeon: Rosetta Posner, MD;  Location: [facility] OR;  Service: Vascular;  Laterality: Right;   HPI:  73 year old male presenting with seizure-like episode, acute loss of consciousness followed by severe combativeness. Intubated 1/29-2/2. MRI of the brain shows subcentimeter left occipital areas of restricted diffusion. PMH including HTN, coronary artery disease, hypercholesterolemia, gastroesophageal reflux disease, and carotid artery stenosis. CXR low volume chest without acute finding.   Assessment / Plan / Recommendation Clinical Impression  Suspicious of pharyngeal dysphagia from neurological aspect and mildly prolonged intubation marked by immediate and delayed cough following 1-2 sips water (unable to do 3 oz water challenge). Pt is alert and able to follow commands for oral motor examimation that did not reveal overt abnormalities. Quality of voice is mildly  hoarse with reduced intensity. Pt affirms odonophagia when swallowing. Recommend NPO except for meds in applesauce, occassional ice chip after oral care and MBS scheduled for 1300 today.      SLP Visit Diagnosis: Dysphagia, pharyngeal phase (R13.13)    Aspiration Risk  Moderate aspiration risk    Diet Recommendation NPO except meds   Medication Administration: Crushed with puree    Other  Recommendations Oral Care Recommendations: Oral care QID;Oral care prior to ice chip/H20   Follow up Recommendations Other (comment)(TBD)      Frequency and Duration            Prognosis        Swallow Study   General HPI: 73 year old male presenting with seizure-like episode, acute loss of consciousness followed by severe combativeness. Intubated 1/29-2/2. MRI of the brain shows subcentimeter left occipital areas of restricted diffusion. PMH including HTN, coronary artery disease, hypercholesterolemia, gastroesophageal reflux disease, and carotid artery stenosis. CXR low volume chest without acute finding. Type of Study: Bedside Swallow Evaluation Previous Swallow Assessment: (none) Diet Prior to this Study: NPO Temperature Spikes Noted: No Respiratory Status: Nasal cannula History of Recent Intubation: Yes Length of Intubations (days): 5 days Date extubated: 06/24/18 Behavior/Cognition: Alert;Cooperative;Pleasant mood;Requires cueing;Other (Comment)(restless) Oral Cavity Assessment: Within Functional Limits Oral Care Completed by SLP: No Oral Cavity - Dentition: Other (Comment)(mostly intact, missing posterior lower) Vision: Functional for self-feeding Self-Feeding Abilities: Needs assist;Needs set up Patient Positioning: Upright in bed Baseline Vocal Quality: Low vocal intensity Volitional Cough: Weak Volitional Swallow: Able to elicit    Oral/Motor/Sensory Function Overall Oral Motor/Sensory Function:  Within functional limits   Ice Chips Ice chips: Not tested   Thin Liquid Thin  Liquid: Impaired Presentation: Cup Oral Phase Impairments: Reduced labial seal Oral Phase Functional Implications: Left anterior spillage;Right anterior spillage Pharyngeal  Phase Impairments: Cough - Immediate;Throat Clearing - Delayed    Nectar Thick Nectar Thick Liquid: Not tested   Honey Thick Honey Thick Liquid: Not tested   Puree Puree: Not tested   Solid     Solid: Not tested      Houston Siren 06/25/2018,10:39 AM   Orbie Pyo [place].Ed Risk analyst [phone] Office [phone]

## 2018-06-25 NOTE — Progress Notes (Signed)
Nutrition Follow-up  DOCUMENTATION CODES:   Obesity unspecified  INTERVENTION:   - Advance diet per SLP evaluation, monitor tolerance - If unable to eat by mouth, recommend resuming TF regimen as follows: Osmolite 1.2 at goal rate of 55 mL/hr plus ProStat 1x daily This provides 1684 kcal, 88 g protein, and 1082 mL water daily  - MVI daily   NUTRITION DIAGNOSIS:   Inadequate oral intake related to inability to eat as evidenced by NPO status.  Ongoing  GOAL:   Provide needs based on ASPEN/SCCM guidelines  Met with TF regimen; currently not meeting  MONITOR:   Vent status, TF tolerance  REASON FOR ASSESSMENT:   Consult, Ventilator Enteral/tube feeding initiation and management  ASSESSMENT:   Pt with PMH of HTN, CVD, and GERD who was admitted with seizures presumed due to L occipital early infarct.   2/2 - pt extubated 2/3 - TF d/c, SLP evaluation recommending NPO, pending MBS study this afternoon  Pt sitting up in chair with fiance present at time of visit. Unsure about appetite, appears somewhat uncomfortable. Denies nausea or vomiting. Last BM today. Discussed pending swallow study will determine diet order. Will monitor.   Diet Order:   Diet Order            Diet NPO time specified  Diet effective now              EDUCATION NEEDS:   No education needs have been identified at this time  Skin:  Skin Assessment: Reviewed RN Assessment  Last BM:  2/3, type 7  Height:   Ht Readings from Last 1 Encounters:  06/20/18 _0  (1.727 m)    Weight:   Wt Readings from Last 1 Encounters:  06/25/18 97 kg    Ideal Body Weight:  70 kg  BMI:  Body mass index is 32.52 kg/m.  Estimated Nutritional Needs:   Kcal:  1540-1820 (22-26 kcal/kg IBW)  Protein:  97-126 gm (1.0-1.3 g/kg ABW)  Fluid:  > 1.5 L/day  Althea Grimmer, MS, RDN, LDN Pager: [phone] Available Mondays and Fridays, 9am-2pm

## 2018-06-25 NOTE — Progress Notes (Signed)
NAME:  Jeremy [name], MRN:  [id number], DOB:  March 31, 1946, LOS: 5 ADMISSION DATE:  06/20/2018, CONSULTATION DATE: June 20, 2018 REFERRING MD: Dr. Tomi Bamberger , CHIEF COMPLAINT: Acute loss of consciousness  Brief History   73 year old male with a past medical history significant for hypertension, cerebrovascular disease was in his usual state of health on June 20, 2018 when he suddenly had a seizure-like episode at [employer].  Was intubated for airway protection in the setting of severe combativeness.  History of present illness   This is a pleasant 73 year old male who came to our facility on June 20, 2018 in the setting of seizure-like episode, acute loss of consciousness followed by severe combativeness.  His family provides history because he was intubated on my arrival.  They state that he has been in his usual state of health.  Apparently had carotid artery surgery several months ago.  At that time he was noted to have some degree of cerebral artery stenosis.  He was awaiting neurology consultation but that had not happened yet.  In the interim since September when he last had his surgery he was complaining of significant headache on a regular basis.  They said that this has been happening off and on.  They denied any sort of alcohol intake, he does not use drugs, he does not smoke cigarettes.  They denied a history of diabetes.  At the [facility] emergency room he required intubation for airway protection.  He had no seizure activity there.  He was noted to have an elevated ammonia.  Past Medical History  Hypertension, coronary artery disease, hypercholesterolemia, gastroesophageal reflux disease, carotid artery stenosis,  Significant Hospital Events   June 20, 2018 admission  Consults:  Neurology  Procedures:  Lumbar puncture January 29: No pleocytosis ECHO 1/31: EF 35 to 40%, mild MR, mild TR ETT 1/29 > 2/2  Significant Diagnostic Tests:  CT head June 20, 2018 showed no  acute intracranial abnormality, chronic right temporoparietal infarct which had been unchanged, cervical spondylosis CT angiogram chest June 20, 2018 no pulmonary embolism, bibasilar atelectasis, some aortic atherosclerosis noted MRI brain 1/29 > acute / early subacute infarction in left occipital lobe.  No hemorrhage or mass effect. RUQ [place] 1/30 > hepatic steatosis. EEG 1/30 > generalized encephalopathy. Echo 1/30 >  EEG 1/30 > diffuse disturbance, no epileptiform activity. CT A/P 2/1 > no acute process.  Increased density throughout the GB may represent tiny stones or sludge.  No adrenal gland nodules.  Micro Data:  CSF culture 1/29 > neg  Antimicrobials:    Interim history/subjective:  Tolerated extubation well.  No complaints this AM.  Objective   Blood pressure (!) 163/74, pulse 79, temperature 98.1 F (36.7 C), temperature source Oral, resp. rate 19, height 5\' 8"  (1.727 m), weight 97 kg, SpO2 96 %.    Vent Mode: BIPAP FiO2 (%):  [40 %] 40 % Set Rate:  [12 bmp] 12 bmp PEEP:  [5 cmH20-6 cmH20] 6 cmH20 Pressure Support:  [10 cmH20] 10 cmH20   Intake/Output Summary (Last 24 hours) at 06/25/2018 0840 Last data filed at 06/25/2018 0759 Gross per 24 hour  Intake 384.6 ml  Output 7300 ml  Net -6915.4 ml   Filed Weights   06/21/18 0500 06/22/18 0500 06/25/18 0500  Weight: 97 kg 106.7 kg 97 kg    Examination:  General: Adult male, resting in bed, in NAD. Neuro: Awake, follows all commands. HEENT: /AT. Sclerae anicteric.  EOMI. Cardiovascular: RRR, no M/R/G.  Lungs: Respirations even and unlabored.  CTA bilaterally, No W/R/R. Abdomen: BS x 4, soft, NT/ND.  Musculoskeletal: No gross deformities, no edema.  Skin: Intact, warm, no rashes.  Assessment & Plan:   Acute respiratory failure with hypoxemia due to inability to protect airway, intubated on mechanical ventilation - s/p extubation 2/2. Continue bronchial hygiene.  Acute onset seizure activity - presumed due to  left occipital early infarct.  EEG negative for epileptiform activity. Continue Keppra, phenytoin and Vimpat per neurology. AEDs managed by neurology service.  Left occipital CVA. Stroke work-up per neurology.  Acute on mild CKD. Repeat labs this AM. Continue to follow. We will hold off on institution of ACE inhibitor this time.  Acute on chronic systolic heart failure. Troponin elevation, likely related to volume overload and heart failure exacerbation. Hx HTN, HLD, CAD. Hold further diuresis given net -3.3L (was on 20mg  q8hrs). Continue Coreg, ASA, rosuvastatin.  Acute metabolic toxic encephalopathy, acute delirium - improving. Likely related to all the medications and post seizure on admission. Continue supportive care. Avoid sedating meds.  Bradycardia  - improved. Continue supportive care.  Significantly hypocortisolemia - unclear significance, presumed adrenal insufficiency as TSH normal; therefore, unlikely panhypopituitarism. Continue stress dose steroids. Outpatient endocrinology follow up.  Nutrition. SLP eval. If fails swallow, will need cortrak.   Best practice:  Diet: N.p.o.  SLP eval today Pain/Anxiety/Delirium protocol (if indicated): N/A VAP protocol (if indicated): N/A DVT prophylaxis: SCD / heparin. GI prophylaxis: Famotidine Glucose control: None, SSI if glucose consistently > 180 Mobility: Bedrest Code Status: Full Family Communication: Son, daughter in law, and fiance updated at bedside 1/30. Disposition: Transfer to Tele.  Will ask TRH to assume care in AM 2/4 with PCCM off at that time.   Montey Hora, [facility] Pager: [phone].  If no answer, [phone] 06/25/2018, 8:58 AM

## 2018-06-25 NOTE — Evaluation (Signed)
Speech Language Pathology Evaluation Patient Details Name: Jeremy [name] MRN: [id number] DOB: 1946/01/06 Today's Date: 06/25/2018 Time: 1002-1019 SLP Time Calculation (min) (ACUTE ONLY): 17 min  Problem List:  Patient Active Problem List   Diagnosis Date Noted  . Seizure ([place]) 06/20/2018  . Acute encephalopathy 06/20/2018  . Asymptomatic carotid artery stenosis without infarction, right 12/25/2017  . Pre-operative cardiovascular examination 11/03/2017  . Carotid artery disease ([place]) 11/03/2017  . Shoulder joint pain 09/17/2013  . Numbness around mouth 12/01/2011  . Erectile dysfunction 09/05/2010  . TRIGGER FINGER, LEFT MIDDLE 04/20/2010  . ANAL FISSURE 04/03/2009  . Mixed dyslipidemia 04/01/2009  . RECTAL BLEEDING 04/01/2009  . ABDOMINAL PAIN-LLQ 04/01/2009  . ABDOMINAL PAIN, GENERALIZED 03/30/2009  . OTITIS EXTERNA, LEFT 05/07/2008  . BRONCHITIS, ACUTE 04/21/2008  . CARPAL TUNNEL SYNDROME, BILATERAL 12/25/2007  . COLD SORE 03/12/2007  . CAD (coronary artery disease) 01/01/2007  . HYPERCHOLESTEROLEMIA, PURE 06/19/2006  . OBESITY NOS 06/19/2006  . HYPERTENSION 06/19/2006  . MYOCARDIAL INFARCTION, HX OF 06/19/2006  . COLONIC POLYPS, HX OF 06/19/2006   Past Medical History:  Past Medical History:  Diagnosis Date  . Arthritis   . Carotid stenosis, asymptomatic, right   . Carpal tunnel syndrome    bilateral  . Coronary artery disease   . GERD (gastroesophageal reflux disease)   . History of kidney stones   . Hx of colonic polyp   . Hypercholesterolemia   . Hypertension   . Myocardial infarction ([place])    hx of  . Obesity    Past Surgical History:  Past Surgical History:  Procedure Laterality Date  . CARDIAC CATHETERIZATION    . CORONARY STENT PLACEMENT  2000   x3   . ENDARTERECTOMY Right 12/25/2017   Procedure: ENDARTERECTOMY CAROTID RIGHT;  Surgeon: Rosetta Posner, MD;  Location: [facility];  Service: Vascular;  Laterality: Right;  . PATCH ANGIOPLASTY Right 12/25/2017   Procedure: PATCH ANGIOPLASTY RIGHT CAROTID ARTERY;  Surgeon: Rosetta Posner, MD;  Location: [facility] OR;  Service: Vascular;  Laterality: Right;   HPI:  73 year old male presenting with seizure-like episode, acute loss of consciousness followed by severe combativeness. Intubated 1/29-2/2. MRI of the brain shows subcentimeter left occipital areas of restricted diffusion. PMH including HTN, coronary artery disease, hypercholesterolemia, gastroesophageal reflux disease, and carotid artery stenosis. CXR low volume chest without acute finding.   Assessment / Plan / Recommendation Clinical Impression  On a basic to moderate scale of severity, pt's cognition was functional. He followed all one step commands, stated place, situation and 2/3 temporal orientation independently. Basic problem solving appeared intact. Oriented to biographical information re: family. Required mod assist for hypothetical/functional word problem/calculation. Pt's speech is not fully intelligible due to decreased volume and need for repetition for listener. SLP recommends continued ST for diagnostic treatment of higher level executive functions and rehabilitation of speech intelligibility.      SLP Assessment  SLP Recommendation/Assessment: Patient needs continued Speech Lanaguage Pathology Services SLP Visit Diagnosis: Cognitive communication deficit (R41.841)    Follow Up Recommendations  (TBD)    Frequency and Duration min 2x/week  2 weeks      SLP Evaluation Cognition  Overall Cognitive Status: Impaired/Different from baseline Arousal/Alertness: Awake/alert(drowsy) Orientation Level: Oriented to person;Oriented to place;Oriented to situation;Oriented to time(only not oriented to month but accurate with cues) Attention: Sustained Sustained Attention: Appears intact Memory: (TBD) Awareness: (TBA, question anticipatory) Problem Solving: (assess executive function) Executive Function: (will assess further) Behaviors:  Restless Safety/Judgment: (question?)  Comprehension  Auditory Comprehension Overall Auditory Comprehension: Appears within functional limits for tasks assessed Commands: Within Functional Limits Conversation: Simple Visual Recognition/Discrimination Discrimination: Not tested Reading Comprehension Reading Status: (TBA)    Expression Expression Primary Mode of Expression: Verbal Verbal Expression Overall Verbal Expression: Appears within functional limits for tasks assessed Initiation: No impairment Level of Generative/Spontaneous Verbalization: Sentence Repetition: No impairment Naming: Not tested Pragmatics: No impairment Written Expression Dominant Hand: Right(clarified with fiance, he is right handed) Written Expression: (TBA)   Oral / Motor  Oral Motor/Sensory Function Overall Oral Motor/Sensory Function: Within functional limits Motor Speech Overall Motor Speech: Impaired Respiration: Impaired Level of Impairment: Conversation Phonation: Low vocal intensity Resonance: Within functional limits Articulation: Within functional limitis Intelligibility: Intelligibility reduced Word: 75-100% accurate Phrase: 75-100% accurate Sentence: 50-74% accurate Conversation: 50-74% accurate Motor Planning: Witnin functional limits   GO                    Houston Siren 06/25/2018, 10:57 AM  Orbie Pyo [place].Ed Risk analyst [phone] Office [phone]

## 2018-06-25 NOTE — Progress Notes (Signed)
Rehab Admissions Coordinator Note:  Per PT and OT recommendations, this patient was screened by Jhonnie Garner for appropriateness for an Inpatient Acute Rehab Consult.  At this time, we are recommending an Inpatient Rehab consult. AC will contact MD regarding request for IP Rehab Consult Order.  Jhonnie Garner 06/25/2018, 8:14 AM  I can be reached at [phone].

## 2018-06-25 NOTE — Progress Notes (Signed)
Patient transferred to [place] room 6 via wheelchair. Fiance at bedside. Vitals stable.

## 2018-06-25 NOTE — Consult Note (Signed)
Physical Medicine and Rehabilitation Consult Reason for Consult:  Decreased functional mobility Referring Physician: [facility]   HPI: Jeremy [name] is a 73 y.o.right handed male with history of hypertension, CKDstage II, right carotid stenosis, history of right parietal CVA,CAD with stenting maintained on aspirin. Per chart review patient lives with girlfriend. Independent prior to admission and retired. One level home with 2 steps to entry. Girlfriend reportedly works during the day. Presented 06/20/2018 with reported seizure required intubation. Cranial CT scan negative. CT angiogram of the chest with no pulmonary emboli.EEG negative for seizure. Echocardiogram with ejection fraction of 40%. Reduced systolic function. Noted ammonia level 122,alcohol level negative.MRI the brain showed subcentimeter focus of reduced diffusion left occipital lobe compatible with acute early subacute infarction. Patient was loaded with thin pack, Keppra as well as Dilantin. Subcutaneous heparin for DVT prophylaxis. Patient was extubated 06/24/2018. Patient is NPO with alternative means of nutritional support. Therapy evaluations completed 06/24/2018 with recommendations of physical medicine rehabilitation consult.   Review of Systems  Constitutional: Negative for chills and fever.  HENT: Negative for hearing loss.   Eyes: Negative for blurred vision and double vision.  Respiratory: Positive for shortness of breath. Negative for cough.   Cardiovascular: Positive for palpitations. Negative for chest pain and leg swelling.  Gastrointestinal: Positive for constipation. Negative for nausea.       GERD  Genitourinary: Negative for dysuria, flank pain and hematuria.  Musculoskeletal: Positive for myalgias.  Skin: Negative for rash.  Neurological: Positive for seizures.  All other systems reviewed and are negative.  Past Medical History:  Diagnosis Date  . Arthritis   . Carotid stenosis, asymptomatic, right     . Carpal tunnel syndrome    bilateral  . Coronary artery disease   . GERD (gastroesophageal reflux disease)   . History of kidney stones   . Hx of colonic polyp   . Hypercholesterolemia   . Hypertension   . Myocardial infarction ([place])    hx of  . Obesity    Past Surgical History:  Procedure Laterality Date  . CARDIAC CATHETERIZATION    . CORONARY STENT PLACEMENT  2000   x3   . ENDARTERECTOMY Right 12/25/2017   Procedure: ENDARTERECTOMY CAROTID RIGHT;  Surgeon: Rosetta Posner, MD;  Location: [facility];  Service: Vascular;  Laterality: Right;  . PATCH ANGIOPLASTY Right 12/25/2017   Procedure: PATCH ANGIOPLASTY RIGHT CAROTID ARTERY;  Surgeon: Rosetta Posner, MD;  Location: [facility];  Service: Vascular;  Laterality: Right;   Family History  Problem Relation Age of Onset  . Other Father 68       deceased old age  . Coronary artery disease Mother 93       deceased  . Hypertension Mother   . Other Sister 76       living and healthy  . Coronary artery disease Brother   . Skin cancer Brother   . Cancer Other        family hx of  . Diabetes Other        family hx of  . Coronary artery disease Other        family hx of male < 53  . Arthritis Other        family hx of  . Asthma Other        family hx of   Social History:  reports that he quit smoking about 28 years ago. He has never used smokeless tobacco. He reports that he  does not drink alcohol or use drugs. Allergies:  Allergies  Allergen Reactions  . Codeine Anaphylaxis  . Oxycontin [Oxycodone Hcl] Shortness Of Breath and Itching  . Morphine And Related Itching   Medications Prior to Admission  Medication Sig Dispense Refill  . aspirin EC 81 MG tablet Take 81 mg by mouth daily.    . clobetasol cream (TEMOVATE) 9.38 % Apply 1 application topically as needed (on affected area on skin).     Marland Kitchen diclofenac (VOLTAREN) 75 MG EC tablet Take 75 mg by mouth 2 (two) times daily.     . enalapril (VASOTEC) 5 MG tablet Take 5 mg by mouth  daily.     Marland Kitchen gabapentin (NEURONTIN) 300 MG capsule Take 300 mg by mouth daily as needed for pain.  1  . ketoconazole (NIZORAL) 2 % shampoo Apply 1 application topically 2 (two) times a week.     . losartan (COZAAR) 100 MG tablet Take 100 mg by mouth daily.     . metoprolol tartrate (LOPRESSOR) 50 MG tablet Take 50 mg by mouth 2 (two) times daily.    . nitroGLYCERIN (NITROSTAT) 0.4 MG SL tablet Place 0.4 mg under the tongue every 5 (five) minutes as needed for chest pain.     Marland Kitchen prochlorperazine (COMPAZINE) 10 MG tablet Take 1 tablet (10 mg total) by mouth 2 (two) times daily as needed (Headache). 10 tablet 0  . rosuvastatin (CRESTOR) 10 MG tablet Take 10 mg by mouth daily.     . traMADol (ULTRAM) 50 MG tablet Take 1 tablet (50 mg total) by mouth every 6 (six) hours as needed for moderate pain. 8 tablet 0    Home: Home Living Family/patient expects to be discharged to:: Private residence Living Arrangements: Spouse/significant other Available Help at Discharge: Family Type of Home: House Home Access: Stairs to enter Technical brewer of Steps: 2 Entrance Stairs-Rails: Right Home Layout: One level Home Equipment: Walker - 2 wheels Additional Comments: Pt answering yes/no questions and requires significant amount of time. No family present to confirm  Functional History: Prior Function Level of Independence: Independent Comments: Pt reporting ADLs, IADLs, drives. Pt answering yes/no questions and requires significant amount of time. No family present to confirm Functional Status:  Mobility: Bed Mobility Overal bed mobility: Needs Assistance Bed Mobility: Supine to Sit, Sit to Supine Supine to sit: Mod assist, +2 for physical assistance, HOB elevated Sit to supine: +2 for safety/equipment, +2 for physical assistance, Total assist General bed mobility comments: Mod A +2 to faciltiate BLEs over EOB and elevate trunk. Requiring Total A +2 to bring BLEs over EOB and then safely lower  trunk Transfers Overall transfer level: Needs assistance Equipment used: 2 person hand held assist Transfers: Sit to/from Stand Sit to Stand: Total assist, +2 physical assistance, From elevated surface, +2 safety/equipment General transfer comment: Pt requiring Total A +2 to power up into standing. Pt requiring blocking of bilateral knees and presents with significant weakness.  Ambulation/Gait General Gait Details: Unable today.  Unable to sidestep toward [facility]    ADL: ADL Overall ADL's : Needs assistance/impaired General ADL Comments: Due to cognition, weakness, and poor balance. Pt requiring Max-Total A for ADLs. Pt eager to participate in therapy. However, with poor activity tolerance and strength  Cognition: Cognition Overall Cognitive Status: Difficult to assess Orientation Level: Oriented to person, Disoriented to place, Disoriented to time, Disoriented to situation Cognition Arousal/Alertness: Awake/alert Behavior During Therapy: Restless Overall Cognitive Status: Difficult to assess Area of Impairment: Following commands  Following Commands: Follows one step commands inconsistently, Follows one step commands with increased time General Comments: Due to decreased communication and restlessness, difficult to assess cognition. Pt requiring significant amount of time for processing thorughout session Difficult to assess due to: Level of arousal  Blood pressure (!) 163/74, pulse 79, temperature 98.1 F (36.7 C), temperature source Oral, resp. rate 19, height 5\' 8"  (1.727 m), weight 97 kg, SpO2 96 %. Physical Exam  Constitutional: No distress.  obese  HENT:  Head: Normocephalic.  Eyes: Pupils are equal, round, and reactive to light.  Neck: Normal range of motion.  Cardiovascular: Normal rate.  Respiratory: Effort normal.  GI: Soft. He exhibits distension.  Neurological: He is alert.  Oriented to place, floor, biographical information. Language intact. Dysarthric.  Moves all  4. UE 4/5 prox to distal. LE: 2+/5 HF, 3/5 KE and 4/5 ADF/PF. Fair FMC, no focal sensory abnl  Skin: Skin is warm.  Psychiatric:  flat    Results for orders placed or performed during the hospital encounter of 06/20/18 (from the past 24 hour(s))  Glucose, capillary     Status: None   Collection Time: 06/24/18 11:54 AM  Result Value Ref Range   Glucose-Capillary 91 70 - 99 mg/dL   Comment 1 Notify RN    Comment 2 Document in Chart   Glucose, capillary     Status: Abnormal   Collection Time: 06/24/18  3:49 PM  Result Value Ref Range   Glucose-Capillary 107 (H) 70 - 99 mg/dL   Comment 1 Notify RN    Comment 2 Document in Chart   Magnesium     Status: None   Collection Time: 06/24/18  4:17 PM  Result Value Ref Range   Magnesium 1.9 1.7 - 2.4 mg/dL  Glucose, capillary     Status: Abnormal   Collection Time: 06/24/18  7:32 PM  Result Value Ref Range   Glucose-Capillary 102 (H) 70 - 99 mg/dL  I-STAT 7, (LYTES, BLD GAS, ICA, H+H)     Status: Abnormal   Collection Time: 06/24/18  8:35 PM  Result Value Ref Range   pH, Arterial 7.488 (H) 7.350 - 7.450   pCO2 arterial 40.4 32.0 - 48.0 mmHg   pO2, Arterial 100.0 83.0 - 108.0 mmHg   Bicarbonate 30.8 (H) 20.0 - 28.0 mmol/L   TCO2 32 22 - 32 mmol/L   O2 Saturation 98.0 %   Acid-Base Excess 7.0 (H) 0.0 - 2.0 mmol/L   Sodium 143 135 - 145 mmol/L   Potassium 3.1 (L) 3.5 - 5.1 mmol/L   Calcium, Ion 1.20 1.15 - 1.40 mmol/L   HCT 40.0 39.0 - 52.0 %   Hemoglobin 13.6 13.0 - 17.0 g/dL   Patient temperature 97.7 F    Collection site RADIAL, ALLEN'S TEST ACCEPTABLE    Drawn by RT    Sample type ARTERIAL   Glucose, capillary     Status: Abnormal   Collection Time: 06/24/18 11:22 PM  Result Value Ref Range   Glucose-Capillary 100 (H) 70 - 99 mg/dL  Glucose, capillary     Status: None   Collection Time: 06/25/18  3:19 AM  Result Value Ref Range   Glucose-Capillary 97 70 - 99 mg/dL  Glucose, capillary     Status: Abnormal   Collection Time:  06/25/18  8:08 AM  Result Value Ref Range   Glucose-Capillary 101 (H) 70 - 99 mg/dL   Comment 1 Notify RN    Comment 2 Document in Chart  Dg Chest Port 1 View  Result Date: 06/25/2018 CLINICAL DATA:  Encounter for feeding tube placement EXAM: PORTABLE CHEST 1 VIEW COMPARISON:  06/22/2018 FINDINGS: Interval tracheal and esophageal extubation. Low volume chest with haziness at the bases that is likely from atelectasis. No effusion or pneumothorax. Normal heart size. Severe glenohumeral osteoarthritis on both sides. IMPRESSION: Low volume chest without acute finding. Electronically Signed   By: Monte Fantasia M.D.   On: 06/25/2018 07:54   Dg Abd Portable 1v  Result Date: 06/24/2018 CLINICAL DATA:  NG tube placement EXAM: PORTABLE ABDOMEN - 1 VIEW COMPARISON:  CT abdomen/pelvis dated 06/23/2018 FINDINGS: Enteric tube terminates in the gastric antrum. Nonobstructive bowel gas pattern. Degenerative changes of the lumbar spine. IMPRESSION: Enteric tube terminates in the gastric antrum. Electronically Signed   By: Julian Hy M.D.   On: 06/24/2018 09:04     Assessment/Plan: Diagnosis: left occipital infarct, seizure, intubated with further debility 1. Does the need for close, 24 hr/day medical supervision in concert with the patient's rehab needs make it unreasonable for this patient to be served in a less intensive setting? Yes 2. Co-Morbidities requiring supervision/potential complications: HTN, CKDIII, hx or previous right CVA, CAD, dysphagia, nutrition 3. Due to bladder management, bowel management, safety, skin/wound care, disease management, medication administration, pain management and patient education, does the patient require 24 hr/day rehab nursing? Yes 4. Does the patient require coordinated care of a physician, rehab nurse, PT (1-2 hrs/day, 5 days/week), OT (1-2 hrs/day, 5 days/week) and SLP (1-2 hrs/day, 5 days/week) to address physical and functional deficits in the context of the  above medical diagnosis(es)? Yes Addressing deficits in the following areas: balance, endurance, locomotion, strength, transferring, bowel/bladder control, bathing, dressing, feeding, grooming, toileting, cognition, speech, swallowing and psychosocial support 5. Can the patient actively participate in an intensive therapy program of at least 3 hrs of therapy per day at least 5 days per week? Yes and Potentially 6. The potential for patient to make measurable gains while on inpatient rehab is good 7. Anticipated functional outcomes upon discharge from inpatient rehab are supervision  with PT, supervision with OT, modified independent and supervision with SLP. 8. Estimated rehab length of stay to reach the above functional goals is: 12-18 days 9. Anticipated D/C setting: Home 10. Anticipated post D/C treatments: [place] therapy 11. Overall Rehab/Functional Prognosis: good  RECOMMENDATIONS: This patient's condition is appropriate for continued rehabilitative care in the following setting: CIR Patient has agreed to participate in recommended program. Yes Note that insurance prior authorization may be required for reimbursement for recommended care.  Comment: Rehab Admissions Coordinator to follow up.  Thanks,  Meredith Staggers, MD, Mellody Drown  I have personally performed a face to face diagnostic evaluation of this patient. Additionally, I have reviewed and concur with the physician assistant's documentation above.    Lavon Paganini Angiulli, PA-C 06/25/2018

## 2018-06-25 NOTE — Progress Notes (Addendum)
NEUROLOGY PROGRESS NOTE  Subjective: Patient appears uncomfortable due to the fact that he states he would like to sit up.  Patient is off sedation and able to tell me that he is at [facility] and follows commands.  Exam: Vitals:   06/25/18 0844 06/25/18 0900  BP: (!) 157/86 (!) 183/70  Pulse: 78 81  Resp: (!) 21 20  Temp:    SpO2: 98% 96%    Physical Exam  HEENT-  Normocephalic, no lesions, without obvious abnormality.  Normal external eye and conjunctiva.  Extremities- Warm, dry and intact Musculoskeletal-no joint tenderness, deformity or swelling Skin-warm and dry, face is red and appears irritated  Neuro:  Mental Status: Alert, oriented, thought content appropriate.  Hypophonic fluent without evidence of aphasia.  Able to follow 2 step commands without difficulty. Cranial Nerves: II:  Visual fields grossly normal,  III,IV, VI: ptosis on right eye, extra-ocular motions intact bilaterally pupils equal, round, reactive to light and accommodation V,VII: smile symmetric, facial light touch sensation normal bilaterally VIII: hearing normal bilaterally Motor: Moving all extremities antigravity Sensory: Pinprick and light touch intact throughout, bilaterally Plantars: Right: downgoing   Left: downgoing Cerebellar: normal finger-to-nose,    Medications:  Scheduled: . aspirin  81 mg Per Tube Daily  . carvedilol  12.5 mg Oral BID WC  . famotidine  20 mg Per Tube BID  . gabapentin  300 mg Per Tube QHS  . heparin injection (subcutaneous)  5,000 Units Subcutaneous Q8H  . hydrocortisone sod succinate (SOLU-CORTEF) inj  50 mg Intravenous Daily  . ipratropium-albuterol  3 mL Nebulization Q6H  . multivitamin with minerals  1 tablet Per Tube Daily  . rosuvastatin  10 mg Per Tube q1800   Continuous: . lacosamide (VIMPAT) IV Stopped (06/25/18 0752)  . levETIRAcetam Stopped (06/25/18 0709)  . phenytoin (DILANTIN) IV 125 mg (06/25/18 0556)    Pertinent Labs/Diagnostics: No new  pertinent labs    Etta Quill PA-C [facility] [phone]   Assessment: 73 year old male with known history of right carotid stenosis and old right parietal infarct presenting with new onset seizure.   1. No further seizure-like activity.  Maintained on Keppra, Dilantin and Vimpat. He is much more awake today and following commands. 2. 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  Seizures at this point have resolved. 3. New embolic-looking punctate infarcts in the left occipital lobe.  Recommendations: - At this time would continue Keppra, Dilantin and Vimpat at current doses.  Patient has had no further seizures. - Continue aspirin - Systolic heart failure management per CCM - Will need to follow-up as an outpatient with Neurology -At this point Neurohospitalist service will sign off.  Please call with any questions. -Per [facility] [place] statutes, patients with seizures are not allowed to drive until  they have been seizure-free for six months. Use caution when using heavy equipment or power tools. Avoid working on ladders or at heights. Take showers instead of baths. Ensure the water temperature is not too high on the home water heater. Do not go swimming alone. When caring for infants or small children, sit down when holding, feeding, or changing them to minimize risk of injury to the child in the event you have a seizure. Also, Maintain good sleep hygiene. Avoid alcohol.   Electronically signed: Dr. Kerney Elbe 06/25/2018, 9:32 AM

## 2018-06-26 DIAGNOSIS — J9602 Acute respiratory failure with hypercapnia: Secondary | ICD-10-CM

## 2018-06-26 LAB — CBC
HEMATOCRIT: 43.6 % (ref 39.0–52.0)
Hemoglobin: 14.2 g/dL (ref 13.0–17.0)
MCH: 30 pg (ref 26.0–34.0)
MCHC: 32.6 g/dL (ref 30.0–36.0)
MCV: 92.2 fL (ref 80.0–100.0)
Platelets: 211 10*3/uL (ref 150–400)
RBC: 4.73 MIL/uL (ref 4.22–5.81)
RDW: 12.8 % (ref 11.5–15.5)
WBC: 9.8 10*3/uL (ref 4.0–10.5)
nRBC: 0 % (ref 0.0–0.2)

## 2018-06-26 LAB — BASIC METABOLIC PANEL
Anion gap: 12 (ref 5–15)
BUN: 42 mg/dL — ABNORMAL HIGH (ref 8–23)
CO2: 26 mmol/L (ref 22–32)
CREATININE: 1.26 mg/dL — AB (ref 0.61–1.24)
Calcium: 9 mg/dL (ref 8.9–10.3)
Chloride: 108 mmol/L (ref 98–111)
GFR calc non Af Amer: 57 mL/min — ABNORMAL LOW (ref >60)
Glucose, Bld: 125 mg/dL — ABNORMAL HIGH (ref 70–99)
Potassium: 3.1 mmol/L — ABNORMAL LOW (ref 3.5–5.1)
Sodium: 146 mmol/L — ABNORMAL HIGH (ref 135–145)

## 2018-06-26 LAB — PHOSPHORUS: Phosphorus: 2.2 mg/dL — ABNORMAL LOW (ref 2.5–4.6)

## 2018-06-26 LAB — MAGNESIUM: Magnesium: 2.3 mg/dL (ref 1.7–2.4)

## 2018-06-26 MED ORDER — POTASSIUM CHLORIDE CRYS ER 20 MEQ PO TBCR
40.0000 meq | EXTENDED_RELEASE_TABLET | Freq: Four times a day (QID) | ORAL | Status: AC
  Administered 2018-06-26 (×2): 40 meq via ORAL
  Filled 2018-06-26 (×2): qty 2

## 2018-06-26 MED ORDER — HYDRALAZINE HCL 20 MG/ML IJ SOLN: 5.0000 mg | INTRAMUSCULAR | Status: DC | PRN

## 2018-06-26 MED ORDER — IPRATROPIUM-ALBUTEROL 0.5-2.5 (3) MG/3ML IN SOLN
3.0000 mL | Freq: Two times a day (BID) | RESPIRATORY_TRACT | Status: DC
  Administered 2018-06-26 – 2018-06-30 (×8): 3 mL via RESPIRATORY_TRACT
  Filled 2018-06-26 (×8): qty 3

## 2018-06-26 MED ORDER — HYDROCORTISONE 10 MG PO TABS: 10.0000 mg | ORAL_TABLET | Freq: Two times a day (BID) | ORAL | Status: DC

## 2018-06-26 MED ORDER — HYDROCORTISONE 5 MG PO TABS
15.0000 mg | ORAL_TABLET | Freq: Two times a day (BID) | ORAL | Status: DC
  Administered 2018-06-26: 15 mg via ORAL
  Filled 2018-06-26: qty 1

## 2018-06-26 MED ORDER — FAMOTIDINE 40 MG/5ML PO SUSR
20.0000 mg | Freq: Two times a day (BID) | ORAL | Status: DC
  Administered 2018-06-26 – 2018-06-27 (×3): 20 mg via ORAL
  Filled 2018-06-26 (×4): qty 2.5

## 2018-06-26 MED ORDER — ISOSORB DINITRATE-HYDRALAZINE 20-37.5 MG PO TABS
1.0000 | ORAL_TABLET | Freq: Three times a day (TID) | ORAL | Status: DC
  Administered 2018-06-26 – 2018-06-30 (×12): 1 via ORAL
  Filled 2018-06-26 (×12): qty 1

## 2018-06-26 MED ORDER — LEVETIRACETAM 100 MG/ML PO SOLN
500.0000 mg | Freq: Two times a day (BID) | ORAL | Status: DC
  Administered 2018-06-26 – 2018-06-27 (×3): 500 mg via ORAL
  Filled 2018-06-26 (×4): qty 5

## 2018-06-26 MED ORDER — POTASSIUM PHOSPHATES 15 MMOLE/5ML IV SOLN
20.0000 mmol | Freq: Once | INTRAVENOUS | Status: AC
  Administered 2018-06-26: 20 mmol via INTRAVENOUS
  Filled 2018-06-26: qty 6.67

## 2018-06-26 MED ORDER — LACOSAMIDE 50 MG PO TABS
100.0000 mg | ORAL_TABLET | Freq: Two times a day (BID) | ORAL | Status: DC
  Administered 2018-06-26 – 2018-06-30 (×8): 100 mg via ORAL
  Filled 2018-06-26 (×8): qty 2

## 2018-06-26 MED ORDER — POTASSIUM CHLORIDE IN NACL 20-0.45 MEQ/L-% IV SOLN
INTRAVENOUS | Status: AC
  Administered 2018-06-26: 15:00:00 via INTRAVENOUS
  Filled 2018-06-26 (×2): qty 1000

## 2018-06-26 NOTE — Progress Notes (Signed)
PROGRESS NOTE                                                                                                                                                                                                             Patient Demographics:    Jeremy [name], is a 73 y.o. male, DOB - Nov 14, 1945, GPQ:[id number]  Admit date - 06/20/2018   Admitting Physician Jeremy Doom, MD  Outpatient Primary MD for the patient is Jeremy Squibb, MD  LOS - 6   Chief Complaint  Patient presents with  . Seizures       Brief Narrative    73 year old male with a past medical history significant for hypertension, cerebrovascular disease was in his usual state of health on June 20, 2018 when he suddenly had a seizure-like episode at [employer].  Any pain emergency room patient required intubation for airway protection, in the setting of severe combativeness, transferred to ICU at [facility] on admission, was seen by neurology, started on AED, he was extubated 06/24/2018, transferred to [facility] 06/26/2018.  Lumbar puncture January 29: No pleocytosis ECHO 1/31: EF 35 to 40%, mild MR, mild TR ETT 1/29 > 2/2   Subjective:    Jeremy [name] today reports generalized weakness, cough, nonproductive, denies any fevers or chills .   Assessment  & Plan :    Active Problems:   Seizure ([place])   Acute encephalopathy   Respiratory failure (HCC)    Acute respiratory failure with hypoxemia -  due to inability to protect airway, intubated on mechanical ventilation - s/p extubation 2/2. -Remains on 2 L nasal cannula, encouraged use incentive spirometry today, with staff, to ambulate, out of bed to chair, -Continue with good bronchial hygiene  Acute onset seizure activity  -Neurology input greatly appreciated, need outpatient follow-up for neurology, AED neurology, currently on Keppra, Dilantin and Vimpat, cussed with pharmacy, will change Keppra and Vimpat to p.o. today,  and Dilantin to change to p.o. tomorrow after adjusting dose pending IV level.  Left occipital CVA. - Stroke work-up per neurology.  Condition is to continue aspirin  CKD stage III. -Creatinine at baseline, continue to monitor closely, continue to hold on restitution of ACE inhibitor at this time  Acute on chronic systolic heart failure. -2D echo showing EF 35 to 40%, today appears to be a  euvolemic, diuresis has been held after he was diuresed -3.3 L ICU, given his mild hyponatremia today, will give total of 500 cc of 1/2 NS with KCL. -Due to volume status closely and resume diuresis when appropriate -He is on beta-blockers, creatinine is elevated, will start on BiDil, will resume on losartan in 1 to 2 days. - Troponin elevation, most likely in the setting of demand ischemia from his seizures, intubation, his opponent are trending down, he had low risk stress test July 2019.  Currently denies any chest pain.   Hypertension -Uncontrolled, continue with Coreg, will start on low-dose BiDil, and will resume losartan in 1 to 2 days  Hyperlipidemia -Continue with home dose statin  Hx of CAD -New with aspirin, statin, beta-blockers, resume losartan in 1 to 2 days, low risk stress test July 7322  Acute metabolic toxic encephalopathy, acute delirium - improving. -Likely from sedation while intubated, as well from all the medications related to the seizures,  -Improving    Bradycardia  - improved. Continue supportive care.  Significantly hypocortisolemia -Unclear significant, he was on stress dose cortisol, his TSH is normal, so unlikely panhypopituitarism, unclear why random cortisol level was obtained -I will discontinue steroids, and monitor of steroids, will try to perform troponin stimulation test in 48 to 78 hours to see if is appropriate to resume.  Nutrition. On dysphagia 1 nectar thick diet  Hyponatremia/hypokalemia -Repeat BMP in a.m., repleted potassium, started on  half-normal saline with KCl.    Code Status : Full  Family Communication  : none at bedside  Disposition Plan  : CIR consult pending  Consults  :  PCCm, neurology  Procedures  : Lumbar puncture January 29: No pleocytosis ECHO 1/31: EF 35 to 40%, mild MR, mild TR ETT 1/29 > 2/2 CT head June 20, 2018 showed no acute intracranial abnormality, chronic right temporoparietal infarct which had been unchanged, cervical spondylosis CT angiogram chest June 20, 2018 no pulmonary embolism, bibasilar atelectasis, some aortic atherosclerosis noted MRI brain 1/29 > acute / early subacute infarction in left occipital lobe.  No hemorrhage or mass effect. RUQ [place] 1/30 > hepatic steatosis. EEG 1/30 > generalized encephalopathy. EEG 1/30 > diffuse disturbance, no epileptiform activity. CT A/P 2/1 > no acute process.  Increased density throughout the GB may represent tiny stones or sludge.  No adrenal gland nodules.  DVT Prophylaxis  :  [place] heparin  Lab Results  Component Value Date   PLT 211 06/26/2018    Antibiotics  :    Anti-infectives (From admission, onward)   Start     Dose/Rate Route Frequency Ordered Stop   06/20/18 1330  cefTRIAXone (ROCEPHIN) 2 g in sodium chloride 0.9 % 100 mL IVPB     2 g 200 mL/hr over 30 Minutes Intravenous  Once 06/20/18 1323 06/20/18 1401        Objective:   Vitals:   06/25/18 2331 06/26/18 0137 06/26/18 0737 06/26/18 0853  BP: (!) 163/67   (!) 199/85  Pulse: 73   81  Resp: (!) 21   (!) 21  Temp:    98.6 F (37 C)  TempSrc:    Oral  SpO2: 94% 94% 92% 94%  Weight:      Height:        Wt Readings from Last 3 Encounters:  06/25/18 97 kg  01/24/18 97.5 kg  12/26/17 98.2 kg     Intake/Output Summary (Last 24 hours) at 06/26/2018 1101 Last data filed at 06/26/2018 0254 Gross  per 24 hour  Intake 400 ml  Output 652 ml  Net -252 ml     Physical Exam  Awake Alert,ill appearing ,frail  laying in bed in no apparent distress  Symmetrical  Chest wall movement, diminished air entry at the bases, no wheezing RRR,No Gallops,Rubs or new Murmurs, No Parasternal Heave +ve B.Sounds, Abd Soft, No tenderness,  No rebound - guarding or rigidity. No Cyanosis, Clubbing or edema, No new Rash or bruise      Data Review:    CBC Recent Labs  Lab 06/20/18 0928  06/21/18 0317  06/23/18 0905 06/23/18 1230 06/24/18 2035 06/25/18 1115 06/26/18 0441  WBC 11.9*  --  11.4*  --  9.5  --   --  9.8 9.8  HGB 15.1   < > 13.6   < > 13.1 12.6* 13.6 15.1 14.2  HCT 47.9   < > 41.9   < > 40.7 37.0* 40.0 45.7 43.6  PLT 269  --  194  --  168  --   --  227 211  MCV 95.4  --  92.9  --  95.8  --   --  91.8 92.2  MCH 30.1  --  30.2  --  30.8  --   --  30.3 30.0  MCHC 31.5  --  32.5  --  32.2  --   --  33.0 32.6  RDW 12.9  --  12.8  --  13.1  --   --  12.9 12.8  LYMPHSABS 4.4*  --   --   --   --   --   --   --   --   MONOABS 1.2*  --   --   --   --   --   --   --   --   EOSABS 0.8*  --   --   --   --   --   --   --   --   BASOSABS 0.1  --   --   --   --   --   --   --   --    < > = values in this interval not displayed.    Chemistries  Recent Labs  Lab 06/20/18 0928  06/20/18 1844 06/21/18 0317  06/22/18 1001 06/23/18 0626 06/23/18 0645 06/23/18 1230 06/23/18 1623 06/24/18 0519 06/24/18 1617 06/24/18 2035 06/25/18 1115 06/26/18 0441  NA 138   < > 137 139   < > 143 144 143 142  --   --   --  143 146* 146*  K 3.9   < > 3.5 4.3   < > 4.1 5.1 4.7 3.8  --   --   --  3.1* 3.2* 3.1*  CL 105  --  106 105  --  117* 118*  --   --   --   --   --   --  105 108  CO2 16*  --  19* 24  --  18* 17*  --   --   --   --   --   --  27 26  GLUCOSE 118*  --  147* 128*  --  97 101*  --   --   --   --   --   --  103* 125*  BUN 22  --  18 19  --  19 27*  --   --   --   --   --   --  32* 42*  CREATININE 1.54*  --  1.25* 1.46*  --  1.24 1.26*  --   --   --   --   --   --  1.14 1.26*  CALCIUM 9.2  --  9.1 8.4*  --  8.0* 8.1*  --   --   --   --   --   --  9.2 9.0    MG  --    < > 2.1 2.2  --   --  2.2  --   --  2.1 2.1 1.9  --  2.1 2.3  AST 39  --  45*  --   --   --   --   --   --   --   --   --   --   --   --   ALT 53*  --  55*  --   --   --   --   --   --   --   --   --   --   --   --   ALKPHOS 47  --  40  --   --   --   --   --   --   --   --   --   --   --   --   BILITOT 0.7  --  0.8  --   --   --   --   --   --   --   --   --   --   --   --    < > = values in this interval not displayed.   ------------------------------------------------------------------------------------------------------------------ Recent Labs    06/23/18 1623  TRIG 295*    Lab Results  Component Value Date   HGBA1C 5.3 06/23/2018   ------------------------------------------------------------------------------------------------------------------ No results for input(s): TSH, T4TOTAL, T3FREE, THYROIDAB in the last 72 hours.  Invalid input(s): FREET3 ------------------------------------------------------------------------------------------------------------------ No results for input(s): VITAMINB12, FOLATE, FERRITIN, TIBC, IRON, RETICCTPCT in the last 72 hours.  Coagulation profile Recent Labs  Lab 06/20/18 0938  INR 1.02    No results for input(s): DDIMER in the last 72 hours.  Cardiac Enzymes Recent Labs  Lab 06/21/18 0317 06/21/18 1132 06/23/18 0626  TROPONINI 1.82* 1.37* 1.01*   ------------------------------------------------------------------------------------------------------------------    Component Value Date/Time   BNP 80.5 09/14/2014 1201    Inpatient Medications  Scheduled Meds: . aspirin  81 mg Per Tube Daily  . carvedilol  12.5 mg Oral BID WC  . famotidine  20 mg Per Tube BID  . gabapentin  300 mg Per Tube QHS  . heparin injection (subcutaneous)  5,000 Units Subcutaneous Q8H  . ipratropium-albuterol  3 mL Nebulization BID  . lacosamide  100 mg Oral BID  . levETIRAcetam  500 mg Oral BID  . multivitamin with minerals  1 tablet Per  Tube Daily  . potassium chloride  40 mEq Oral Q6H  . rosuvastatin  10 mg Per Tube q1800   Continuous Infusions: . 0.45 % NaCl with KCl 20 mEq / L    . lacosamide (VIMPAT) IV 100 mg (06/25/18 2331)  . phenytoin (DILANTIN) IV 125 mg (06/26/18 0614)   PRN Meds:.ibuprofen, labetalol, ondansetron (ZOFRAN) IV, polyvinyl alcohol, RESOURCE THICKENUP CLEAR, sodium chloride flush  Micro Results Recent Results (from the past 240 hour(s))  CSF culture     Status: None   Collection Time: 06/20/18  1:23 PM  Result Value Ref Range Status   Specimen Description  CSF  Final   Special Requests NONE  Final   Gram Stain   Final    NO ORGANISMS SEEN WBC PRESENT, PREDOMINANTLY MONONUCLEAR CYTOSPIN SMEAR Gram Stain Report Called to,Read Back By and Verified With: MINTER R. AT 1450 ON [id number] BY THOMPSON S. Performed at [facility], [address] [place], [place] [address]    Culture NO GROWTH  Final   Report Status 06/23/2018 FINAL  Final  MRSA PCR Screening     Status: None   Collection Time: 06/20/18  4:00 PM  Result Value Ref Range Status   MRSA by PCR NEGATIVE NEGATIVE Final    Comment:        The GeneXpert MRSA Assay (FDA approved for NASAL specimens only), is one component of a comprehensive MRSA colonization surveillance program. It is not intended to diagnose MRSA infection nor to guide or monitor treatment for MRSA infections. Performed at [facility], [place] [address] [place], [place] [address]   Culture, blood (Routine X 2) w Reflex to ID Panel     Status: None (Preliminary result)   Collection Time: 06/22/18  3:05 PM  Result Value Ref Range Status   Specimen Description BLOOD RIGHT HAND  Final   Special Requests   Final    BOTTLES DRAWN AEROBIC ONLY Blood Culture adequate volume   Culture   Final    NO GROWTH 4 DAYS Performed at [facility], [place] [address] [place], [place] [address]    Report Status PENDING  Incomplete  Culture, blood (Routine X 2) w Reflex  to ID Panel     Status: None (Preliminary result)   Collection Time: 06/22/18  3:33 PM  Result Value Ref Range Status   Specimen Description BLOOD RIGHT ARM  Final   Special Requests   Final    BOTTLES DRAWN AEROBIC ONLY Blood Culture adequate volume   Culture   Final    NO GROWTH 4 DAYS Performed at [facility], [place] [address] [place], [place] [address]    Report Status PENDING  Incomplete    Radiology Reports Ct Abdomen Pelvis Wo Contrast  Result Date: 06/23/2018 CLINICAL DATA:  Adrenal insufficiency. EXAM: CT ABDOMEN AND PELVIS WITHOUT CONTRAST TECHNIQUE: Multidetector CT imaging of the abdomen and pelvis was performed following the standard protocol without IV contrast. COMPARISON:  08/04/2011 FINDINGS: Lower chest: Atelectasis in the lung bases. Coronary artery calcifications. Hepatobiliary: Increased density diffusely throughout the gallbladder may represent tiny stones or sludge. No wall thickening or inflammatory infiltration identified. No bile duct dilatation. No focal liver lesions. Pancreas: Unenhanced appearance is unremarkable. Spleen: Unenhanced appearance is unremarkable. Adrenals/Urinary Tract: No adrenal gland nodules. Small cysts on the right kidney. Small parapelvic cysts on the left kidney. No hydronephrosis or hydroureter. Bladder is decompressed with a Foley catheter. Stomach/Bowel: Stomach, small bowel, and colon are not abnormally distended. No wall thickening or inflammatory changes appreciated. Duodenal diverticula arising from the second portion. Appendix is normal. Vascular/Lymphatic: Aortic atherosclerosis. No enlarged abdominal or pelvic lymph nodes. Probable calcific stenosis of the distal aorta at the bifurcation. Reproductive: Prostate is unremarkable. Other: No abdominal wall hernia or abnormality. No abdominopelvic ascites. Prominent visceral adipose tissues. Musculoskeletal: Degenerative changes in the spine. No destructive bone lesions. IMPRESSION: 1. No  acute process demonstrated in the abdomen or pelvis. No evidence of bowel obstruction or inflammation. 2. Increased density diffusely throughout the gallbladder may represent tiny stones or sludge. 3. Probable calcific stenosis of the distal aorta at  the bifurcation. 4. No adrenal gland nodules. Aortic Atherosclerosis (ICD10-I70.0). Electronically Signed   By: Lucienne Capers M.D.   On: 06/23/2018 02:38   Ct Head Wo Contrast  Result Date: 06/21/2018 CLINICAL DATA:  New onset seizures.  Stroke follow-up. EXAM: CT HEAD WITHOUT CONTRAST TECHNIQUE: Contiguous axial images were obtained from the base of the skull through the vertex without intravenous contrast. COMPARISON:  CT scan June 20, 2018.  MRI June 20, 2018. FINDINGS: Brain: No subdural, epidural, or subarachnoid hemorrhage. Cerebellum, brainstem, and basal cisterns are normal. The new tiny left occipital infarct could not be seen on this study. The chronic right parietal temporal infarct is stable. Ventricles and sulci are stable. No other acute interval changes identified. Vascular: Calcified atherosclerosis in the intracranial carotids. Skull: Normal. Negative for fracture or focal lesion. Sinuses/Orbits: Opacification of inferior right mastoid air cells without bony erosion. Paranasal sinuses, mastoid air cells, and middle ears are otherwise normal. Other: None. IMPRESSION: 1. The new left occipital lobe infarct is seen on the MRI from yesterday is not seen on today's CT scan. The chronic right temporoparietal infarct is stable. No other interval changes or acute abnormalities. Electronically Signed   By: Dorise Bullion III M.D   On: 06/21/2018 21:14   Ct Head Wo Contrast  Result Date: 06/20/2018 CLINICAL DATA:  Cervical spine trauma.  Seizure EXAM: CT HEAD WITHOUT CONTRAST CT CERVICAL SPINE WITHOUT CONTRAST TECHNIQUE: Multidetector CT imaging of the head and cervical spine was performed following the standard protocol without intravenous  contrast. Multiplanar CT image reconstructions of the cervical spine were also generated. COMPARISON:  CT head 10/26/2017 FINDINGS: CT HEAD FINDINGS Brain: Moderate atrophy. Mild chronic microvascular ischemic change. Chronic infarct right temporoparietal lobe. Negative for acute infarct, hemorrhage, or mass. Vascular: Atherosclerotic calcification. Negative for hyperdense vessel Skull: Negative for fracture Sinuses/Orbits: Negative Other: None CT CERVICAL SPINE FINDINGS Alignment: Normal Skull base and vertebrae: Negative for fracture Soft tissues and spinal canal: Endotracheal tube and NG tubes in satisfactory position. No soft tissue mass or swelling Disc levels: Multilevel disc degeneration and spurring throughout the cervical spine. Upper chest: Mild atelectasis bilaterally. No significant pleural effusion Other: None IMPRESSION: 1. No acute intracranial abnormality. Chronic right temporoparietal infarct unchanged 2. Cervical spondylosis without cervical fracture. Electronically Signed   By: Franchot Gallo M.D.   On: 06/20/2018 11:08   Ct Cervical Spine Wo Contrast  Result Date: 06/20/2018 CLINICAL DATA:  Cervical spine trauma.  Seizure EXAM: CT HEAD WITHOUT CONTRAST CT CERVICAL SPINE WITHOUT CONTRAST TECHNIQUE: Multidetector CT imaging of the head and cervical spine was performed following the standard protocol without intravenous contrast. Multiplanar CT image reconstructions of the cervical spine were also generated. COMPARISON:  CT head 10/26/2017 FINDINGS: CT HEAD FINDINGS Brain: Moderate atrophy. Mild chronic microvascular ischemic change. Chronic infarct right temporoparietal lobe. Negative for acute infarct, hemorrhage, or mass. Vascular: Atherosclerotic calcification. Negative for hyperdense vessel Skull: Negative for fracture Sinuses/Orbits: Negative Other: None CT CERVICAL SPINE FINDINGS Alignment: Normal Skull base and vertebrae: Negative for fracture Soft tissues and spinal canal: Endotracheal  tube and NG tubes in satisfactory position. No soft tissue mass or swelling Disc levels: Multilevel disc degeneration and spurring throughout the cervical spine. Upper chest: Mild atelectasis bilaterally. No significant pleural effusion Other: None IMPRESSION: 1. No acute intracranial abnormality. Chronic right temporoparietal infarct unchanged 2. Cervical spondylosis without cervical fracture. Electronically Signed   By: Franchot Gallo M.D.   On: 06/20/2018 11:08   Mr Brain Wo Contrast  Result  Date: 06/20/2018 CLINICAL DATA:  73 y/o  M; seizure-like episode. EXAM: MRI HEAD WITHOUT CONTRAST TECHNIQUE: Multiplanar, multiecho pulse sequences of the brain and surrounding structures were obtained without intravenous contrast. COMPARISON:  06/20/2018 CT head.  10/26/2017 MRI head. FINDINGS: Brain: Subcentimeter focus of reduced diffusion within the left occipital lobe (series 3, image 26 and series 350, image 26) compatible with acute/early subacute infarction. No associated hemorrhage or mass effect. Chronic right temporoparietal infarction. Stable punctate nonspecific T2 FLAIR hyperintensities in subcortical and periventricular white matter are compatible with mild chronic microvascular ischemic changes. Stable mild volume loss of the brain. There is chronic hemosiderin staining of the right temporoparietal infarction. Few scattered punctate foci of susceptibility hypointensity are compatible hemosiderin deposition of chronic microhemorrhage and in a nonspecific distribution. No extra-axial collection, hydrocephalus, herniation, or acute hemorrhage identified. Vascular: Normal flow voids. Skull and upper cervical spine: Normal marrow signal. Sinuses/Orbits: Mild mucosal thickening of the paranasal sinuses, increased signal of mastoid air cells, and debris within the nasopharynx, likely due to intubation. Orbits are unremarkable. Other: None. IMPRESSION: 1. Subcentimeter focus of reduced diffusion in left occipital  lobe compatible with acute/early subacute infarction. No associated hemorrhage or mass effect. 2. Stable mild chronic microvascular ischemic changes and volume loss of the brain. Stable right temporoparietal chronic infarction. These results will be called to the ordering clinician or representative by the Radiologist Assistant, and communication documented in the PACS or zVision Dashboard. Electronically Signed   By: Kristine Garbe M.D.   On: 06/20/2018 22:06   Dg Chest Port 1 View  Result Date: 06/25/2018 CLINICAL DATA:  Encounter for feeding tube placement EXAM: PORTABLE CHEST 1 VIEW COMPARISON:  06/22/2018 FINDINGS: Interval tracheal and esophageal extubation. Low volume chest with haziness at the bases that is likely from atelectasis. No effusion or pneumothorax. Normal heart size. Severe glenohumeral osteoarthritis on both sides. IMPRESSION: Low volume chest without acute finding. Electronically Signed   By: Monte Fantasia M.D.   On: 06/25/2018 07:54   Dg Chest Port 1 View  Result Date: 06/22/2018 CLINICAL DATA:  Intubation. EXAM: PORTABLE CHEST 1 VIEW COMPARISON:  06/21/2018. FINDINGS: Endotracheal tube and NG tube in stable position. Cardiomegaly. Persistent bibasilar atelectasis/infiltrates, left side greater right. Small left pleural effusion. No pneumothorax. IMPRESSION: 1.  Endotracheal tube and NG tube in stable position. 2. Persistent bibasilar atelectasis/infiltrates, left side greater than right. Persistent small left pleural effusion. Similar findings noted on prior exam. Electronically Signed   By: Marcello Moores  Register   On: 06/22/2018 06:32   Dg Chest Port 1 View  Result Date: 06/21/2018 CLINICAL DATA:  Shortness of breath and abdominal distension EXAM: PORTABLE CHEST 1 VIEW COMPARISON:  June 21, 2018 FINDINGS: Stable cardiomegaly. The hila and mediastinum are unremarkable. The ETT terminates in good position as does the NG tube. Low lung volumes. Haziness over the left base,  likely atelectasis. No suspicious infiltrate. IMPRESSION: 1. The study is limited due to the low volume portable technique. 2. Haziness over the left base favored to represent atelectasis. 3. Support apparatus in good position. Electronically Signed   By: Dorise Bullion III M.D   On: 06/21/2018 18:30   Dg Chest Port 1 View  Result Date: 06/21/2018 CLINICAL DATA:  Seizure activity, check endotracheal tube placement EXAM: PORTABLE CHEST 1 VIEW COMPARISON:  06/20/2018 FINDINGS: The endotracheal tube has been withdrawn and now lies 5.2 cm above the carina. Gastric catheter is noted within the stomach. The cardiac shadow is prominent but accentuated by the portable technique.  The lungs are clear. No acute bony abnormality is noted. IMPRESSION: Tubes and lines as described. Improved aeration although the overall inspiratory effort remains poor. Electronically Signed   By: Inez Catalina M.D.   On: 06/21/2018 08:09   Dg Chest Portable 1 View  Result Date: 06/20/2018 CLINICAL DATA:  73 year old male with a history of fall and head injury EXAM: PORTABLE CHEST 1 VIEW COMPARISON:  09/14/2014 FINDINGS: Endotracheal tube terminates of less than 2 cm above the carina. Gastric tube projects over the mediastinum terminating out of the field of view. Low lung volumes accentuates the interstitium. Cardiomediastinal silhouette projects widened at the level of the vascular pedicle. There is double density along the left heart border, not present on the comparison. Blunting at the left costophrenic angle. No pneumothorax. IMPRESSION: Endotracheal tube terminates less than 2 cm above the carina. This may be better positioned by withdrawing to the region of the clavicular heads, approximately 5-6 cm. Low lung volumes with likely atelectasis. Left basilar effusion/consolidation not excluded. The upper mediastinum appears widened compared to the prior plain film. This may be a consequence of the low lung volumes, however, acute  mediastinal or vascular abnormality can not be excluded on the plain film. These results were called by telephone at the time of interpretation on 06/20/2018 at 10:07 am to Dr. Dorie Rank. Electronically Signed   By: Corrie Mckusick D.O.   On: 06/20/2018 10:07   Dg Abd Portable 1v  Result Date: 06/24/2018 CLINICAL DATA:  NG tube placement EXAM: PORTABLE ABDOMEN - 1 VIEW COMPARISON:  CT abdomen/pelvis dated 06/23/2018 FINDINGS: Enteric tube terminates in the gastric antrum. Nonobstructive bowel gas pattern. Degenerative changes of the lumbar spine. IMPRESSION: Enteric tube terminates in the gastric antrum. Electronically Signed   By: Julian Hy M.D.   On: 06/24/2018 09:04   Dg Abd Portable 1v  Result Date: 06/21/2018 CLINICAL DATA:  Shortness of breath and abdominal distention EXAM: PORTABLE ABDOMEN - 1 VIEW COMPARISON:  None. FINDINGS: The paucity of bowel gas limits evaluation. No evidence of obstruction within this limitation. Degenerative changes in the lumbar spine. No renal stones noted. A few calcifications in the left pelvis are nonspecific but may be vascular in nature. No other acute abnormalities. IMPRESSION: 1. A paucity of bowel gas limits evaluation but there is no evidence of obstruction. 2. Calcifications in the left pelvis are nonspecific but may be vascular in nature. No renal stones noted. Electronically Signed   By: Dorise Bullion III M.D   On: 06/21/2018 18:31   Dg Abd Portable 1v  Result Date: 06/21/2018 CLINICAL DATA:  Orogastric tube placement. EXAM: PORTABLE ABDOMEN - 1 VIEW COMPARISON:  None. FINDINGS: Tip and side port of the enteric tube below the diaphragm in the stomach. Nonobstructive bowel gas pattern. Bibasilar atelectasis with small pleural effusions. IMPRESSION: Tip and side port of the enteric tube below the diaphragm in the stomach. Electronically Signed   By: Keith Rake M.D.   On: 06/21/2018 01:52   Dg Swallowing Func-speech Pathology  Result Date:  06/25/2018 Objective Swallowing Evaluation: Type of Study: MBS-Modified Barium Swallow Study  Patient Details Name: SHAROD PETSCH MRN: [id number] Date of Birth: Jan 25, 1946 Today's Date: 06/25/2018 Time: SLP Start Time (ACUTE ONLY): 6237 -SLP Stop Time (ACUTE ONLY): 1343 SLP Time Calculation (min) (ACUTE ONLY): 20 min Past Medical History: Past Medical History: Diagnosis Date . Arthritis  . Carotid stenosis, asymptomatic, right  . Carpal tunnel syndrome   bilateral . Coronary artery disease  .  GERD (gastroesophageal reflux disease)  . History of kidney stones  . Hx of colonic polyp  . Hypercholesterolemia  . Hypertension  . Myocardial infarction ([place])   hx of . Obesity  Past Surgical History: Past Surgical History: Procedure Laterality Date . CARDIAC CATHETERIZATION   . CORONARY STENT PLACEMENT  2000  x3  . ENDARTERECTOMY Right 12/25/2017  Procedure: ENDARTERECTOMY CAROTID RIGHT;  Surgeon: Rosetta Posner, MD;  Location: [facility];  Service: Vascular;  Laterality: Right; . PATCH ANGIOPLASTY Right 12/25/2017  Procedure: PATCH ANGIOPLASTY RIGHT CAROTID ARTERY;  Surgeon: Rosetta Posner, MD;  Location: [facility];  Service: Vascular;  Laterality: Right; HPI: 73 year old male presenting with seizure-like episode, acute loss of consciousness followed by severe combativeness. Intubated 1/29-2/2. MRI of the brain shows subcentimeter left occipital areas of restricted diffusion. PMH including HTN, coronary artery disease, hypercholesterolemia, gastroesophageal reflux disease, and carotid artery stenosis. CXR low volume chest without acute finding.  No data recorded Assessment / Plan / Recommendation CHL IP CLINICAL IMPRESSIONS 06/25/2018 Clinical Impression Pt exhibited mild-moderate oropharyngeal dysphagia with aspiration and penetration of nectar and honey thick boluses. Oral phase marked by mildly decreased cohesion and coordination of transit with thinner boluses. Pt's ability to elevate larynx and deflect epiglottis to protect airway is  adequate however timing to initiate protective mechanisms was suboptimal. Nectar barium was silently aspirated during the swallow and honey thick substantially penetrated despite chin tuck head position, modification of bolus size or presentation. Recommend pt initiate Dys 1 (puree) consistency and nothing thinner than puree (liquids thickened to pudding consistency), alert for po's, upright, small bites and throat clear intermittently.      SLP Visit Diagnosis Dysphagia, pharyngeal phase (R13.13) Attention and concentration deficit following -- Frontal lobe and executive function deficit following -- Impact on safety and function Moderate aspiration risk;Severe aspiration risk   CHL IP TREATMENT RECOMMENDATION 06/25/2018 Treatment Recommendations Therapy as outlined in treatment plan below   Prognosis 06/25/2018 Prognosis for Safe Diet Advancement Good Barriers to Reach Goals -- Barriers/Prognosis Comment -- CHL IP DIET RECOMMENDATION 06/25/2018 SLP Diet Recommendations Pudding thick liquid;Dysphagia 1 (Puree) solids Liquid Administration via -- Medication Administration Crushed with puree Compensations Slow rate;Small sips/bites;Clear throat intermittently Postural Changes Seated upright at 90 degrees   CHL IP OTHER RECOMMENDATIONS 06/25/2018 Recommended Consults -- Oral Care Recommendations Oral care BID Other Recommendations Order thickener from pharmacy   CHL IP FOLLOW UP RECOMMENDATIONS 06/25/2018 Follow up Recommendations Home health SLP   CHL IP FREQUENCY AND DURATION 06/25/2018 Speech Therapy Frequency (ACUTE ONLY) min 2x/week Treatment Duration 2 weeks      CHL IP ORAL PHASE 06/25/2018 Oral Phase Impaired Oral - Pudding Teaspoon -- Oral - Pudding Cup -- Oral - Honey Teaspoon Decreased bolus cohesion Oral - Honey Cup Decreased bolus cohesion Oral - Nectar Teaspoon -- Oral - Nectar Cup Decreased bolus cohesion Oral - Nectar Straw -- Oral - Thin Teaspoon -- Oral - Thin Cup -- Oral - Thin Straw -- Oral - Puree WFL Oral -  Mech Soft -- Oral - Regular -- Oral - Multi-Consistency -- Oral - Pill -- Oral Phase - Comment --  CHL IP PHARYNGEAL PHASE 06/25/2018 Pharyngeal Phase Impaired Pharyngeal- Pudding Teaspoon -- Pharyngeal -- Pharyngeal- Pudding Cup -- Pharyngeal -- Pharyngeal- Honey Teaspoon Penetration/Aspiration during swallow Pharyngeal Material enters airway, remains ABOVE vocal cords and not ejected out Pharyngeal- Honey Cup Penetration/Aspiration during swallow Pharyngeal Material enters airway, remains ABOVE vocal cords and not ejected out Pharyngeal- Nectar Teaspoon -- Pharyngeal --  Pharyngeal- Nectar Cup Penetration/Aspiration during swallow;Other (Comment) Pharyngeal Material enters airway, passes BELOW cords without attempt by patient to eject out (silent aspiration) Pharyngeal- Nectar Straw -- Pharyngeal -- Pharyngeal- Thin Teaspoon -- Pharyngeal -- Pharyngeal- Thin Cup -- Pharyngeal -- Pharyngeal- Thin Straw -- Pharyngeal -- Pharyngeal- Puree WFL Pharyngeal -- Pharyngeal- Mechanical Soft -- Pharyngeal -- Pharyngeal- Regular -- Pharyngeal -- Pharyngeal- Multi-consistency -- Pharyngeal -- Pharyngeal- Pill -- Pharyngeal -- Pharyngeal Comment --  CHL IP CERVICAL ESOPHAGEAL PHASE 06/25/2018 Cervical Esophageal Phase WFL Pudding Teaspoon -- Pudding Cup -- Honey Teaspoon -- Honey Cup -- Nectar Teaspoon -- Nectar Cup -- Nectar Straw -- Thin Teaspoon -- Thin Cup -- Thin Straw -- Puree -- Mechanical Soft -- Regular -- Multi-consistency -- Pill -- Cervical Esophageal Comment -- Houston Siren 06/25/2018, 2:56 PM Orbie Pyo Litaker M.Ed Actor Pager [phone] Office [phone]              Ct Angio Chest Aorta W And/or Wo Contrast  Result Date: 06/20/2018 CLINICAL DATA:  Chest pain.  Altered mental status.  Seizure. EXAM: CT ANGIOGRAPHY CHEST WITH CONTRAST TECHNIQUE: Multidetector CT imaging of the chest was performed using the standard protocol during bolus administration of intravenous contrast.  Multiplanar CT image reconstructions and MIPs were obtained to evaluate the vascular anatomy. CONTRAST:  168mL ISOVUE-370 IOPAMIDOL (ISOVUE-370) INJECTION 76% COMPARISON:  Chest x-ray dated 06/20/2018 FINDINGS: Cardiovascular: Endotracheal tube 3.2 cm above the carina. NG tube tip in the fundus of the stomach. Aortic atherosclerosis. Heart size is normal. No pericardial effusion. No pulmonary emboli. Mediastinum/Nodes: No enlarged mediastinal, hilar, or axillary lymph nodes. Thyroid gland, trachea, and esophagus demonstrate no significant findings. Lungs/Pleura: There is slight atelectasis at both lung bases posterior medially. No effusions. Upper Abdomen: No significant abnormalities. Musculoskeletal: No chest wall abnormality. No acute or significant osseous findings. Review of the MIP images confirms the above findings. IMPRESSION: 1. No pulmonary emboli. 2. Bibasilar atelectasis. 3.  Aortic Atherosclerosis (ICD10-I70.0). Electronically Signed   By: Lorriane Shire M.D.   On: 06/20/2018 11:09   US Abdomen Limited Ruq  Result Date: 06/21/2018 CLINICAL DATA:  Increased ammonia level EXAM: ULTRASOUND ABDOMEN LIMITED RIGHT UPPER QUADRANT COMPARISON:  10/05/2017 FINDINGS: Gallbladder: No gallstones or wall thickening visualized. No sonographic Murphy sign noted by sonographer. Common bile duct: Limited coverage.  Diameter: 5 mm where where seen. Liver: Echogenic liver consistent with steatosis. No overt signs of cirrhosis. No evident mass. Portal vein is patent on color Doppler imaging with normal direction of blood flow towards the liver. IMPRESSION: Hepatic steatosis. Electronically Signed   By: Monte Fantasia M.D.   On: 06/21/2018 05:46     Phillips Climes M.D on 06/26/2018 at 11:01 AM  Between 7am to 7pm - Pager - [phone]  After 7pm go to [email or web address] - password [facility]  [facility] -  Office  [phone]

## 2018-06-26 NOTE — Consult Note (Signed)
   [facility] CM Inpatient Consult   06/26/2018  ROOK MAUE [birth year] [id number]  Patient transferred out of ICU 06/25/2018.  Patient screened for [employer] member for medium risk score of 16% and 2 hospitalizations in the past 6 months.  Reviewed to check if potential [facility] services are needed. Chart was reviewed and patient per Neurology notes: 73 year old male with known history of right carotid stenosis and old right parietal infarct presenting with new onset seizure.   1. No further seizure-like activity.  Maintained on Keppra, Dilantin and Vimpat. He is much more awake today and following commands. 2. 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  Seizures at this point have resolved. 3. New embolic-looking punctate infarcts in the left occipital lobe. Chart review reveals patient's family and recommendations for an inpatient rehab stay. Will follow for progress and disposition.  Came by to see patient no family at bedside and he was receiving his meal with nursing care.  No family at the bedside. Patient awaiting authorization for CIR stay.  Please place a [facility] Management consult or for questions contact:   Natividad Brood, RN BSN [facility] Liaison  [phone] business mobile phone Toll free office [phone]

## 2018-06-26 NOTE — Progress Notes (Signed)
SP02 90% on RA.  Pt placed back on 2L [place].  Sp02 increased to 95%. Will continue to monitor.

## 2018-06-26 NOTE — Care Management Note (Signed)
Case Management Note  Patient Details  Name: KAEDEN MESTER MRN: [id number] Date of Birth: 1945-08-09  Subjective/Objective:   Admitted with seizures, workup revealed stroke                   Action/Plan:   PTA from home with fiance.  CIR recommended - CSW consulted for SNF as back up plan.  CM will continue to follow for discharge needs   Expected Discharge Date:                  Expected Discharge Plan:  [place]  In-House Referral:  Clinical Social Work  Discharge planning Services  CM Consult  Post Acute Care Choice:    Choice offered to:     DME Arranged:    DME Agency:     HH Arranged:    [place] Agency:     Status of Service:  In process, will continue to follow  If discussed at Long Length of Stay Meetings, dates discussed:    Additional Comments:  Maryclare Labrador, RN 06/26/2018, 9:11 AM

## 2018-06-26 NOTE — Progress Notes (Signed)
Physical Therapy Treatment Patient Details Name: Jeremy [name] MRN: [id number] DOB: [birth year] Today's Date: 06/26/2018    History of Present Illness 73 year old male presenting with seizure-like episode, acute loss of consciousness followed by severe combativeness. Intubated 1/29-2/2. MRI of the brain shows subcentimeter left occipital areas of restricted diffusion. PMH including HTN, coronary artery disease, hypercholesterolemia, gastroesophageal reflux disease, and carotid artery stenosis.     PT Comments    Patient progressing well towards PT goals. Tolerated gait training with min A for balance/safety. Veers left during gait and running into things on left side and not able to self correct. Sp02 remained >90% on RA throughout session. Pt with difficulty following 1 step commands despite repetition and increased time. Also with poor awareness of deficits. Notable visual deficits especially in left upper quadrant during functional mobility. Pt not a great historian so difficult to get great assessment. Further visual testing needed.  Great CIR candidate. Will follow.  Follow Up Recommendations  CIR     Equipment Recommendations  Other (comment)(defer to next venue)    Recommendations for Other Services       Precautions / Restrictions Precautions Precautions: Fall Precaution Comments: visual deficits  Restrictions Weight Bearing Restrictions: No    Mobility  Bed Mobility Overal bed mobility: Needs Assistance Bed Mobility: Rolling;Sidelying to Sit Rolling: Min guard Sidelying to sit: Min assist;HOB elevated       General bed mobility comments: Cues to reach for rail and roll towards left side; needs assist to elevate trunk.  Transfers Overall transfer level: Needs assistance Equipment used: Rolling walker (2 wheeled) Transfers: Sit to/from Stand Sit to Stand: Min assist;Min guard         General transfer comment: Min A initially to power to standing with cues for  hand placement, despite repetition of cues to push from chair, pt still pulling up on RW. Stood from [employer], from chair x2.   Ambulation/Gait Ambulation/Gait assistance: Min assist Gait Distance (Feet): 50 Feet(+50') Assistive device: Rolling walker (2 wheeled) Gait Pattern/deviations: Step-through pattern;Decreased stride length;Staggering left   Gait velocity interpretation: <1.31 ft/sec, indicative of household ambulator General Gait Details: Slow, unsteady gait with pt veering left and running into things on left side; difficulty finding signs/reading things in left upper visual field. Reports blurriness as well. 1 seated rest break. Sp02 >90% on RA.    Stairs             Wheelchair Mobility    Modified Rankin (Stroke Patients Only) Modified Rankin (Stroke Patients Only) Pre-Morbid Rankin Score: No symptoms Modified Rankin: Moderately severe disability     Balance Overall balance assessment: Needs assistance Sitting-balance support: No upper extremity supported;Feet supported Sitting balance-Leahy Scale: Fair     Standing balance support: During functional activity;Bilateral upper extremity supported Standing balance-Leahy Scale: Poor Standing balance comment: Requires BUe support in standing.                             Cognition Arousal/Alertness: Awake/alert Behavior During Therapy: WFL for tasks assessed/performed Overall Cognitive Status: Impaired/Different from baseline Area of Impairment: Following commands;Awareness;Problem solving                       Following Commands: Follows one step commands inconsistently;Follows one step commands with increased time   Awareness: Intellectual Problem Solving: Slow processing;Requires verbal cues General Comments: Increased time to process and to answer questions. Knows he had a stroke but  does not acknowledge visual deficits despite not being able to read signs/find things in left upper visual  field.       Exercises      General Comments General comments (skin integrity, edema, etc.): Wife present during session.       Pertinent Vitals/Pain Pain Assessment: No/denies pain    Home Living                      Prior Function            PT Goals (current goals can now be found in the care plan section) Progress towards PT goals: Progressing toward goals    Frequency    Min 3X/week      PT Plan Current plan remains appropriate    Co-evaluation              AM-PAC PT "6 Clicks" Mobility   Outcome Measure  Help needed turning from your back to your side while in a flat bed without using bedrails?: A Little Help needed moving from lying on your back to sitting on the side of a flat bed without using bedrails?: A Little Help needed moving to and from a bed to a chair (including a wheelchair)?: A Little Help needed standing up from a chair using your arms (e.g., wheelchair or bedside chair)?: A Little Help needed to walk in hospital room?: A Little Help needed climbing 3-5 steps with a railing? : A Lot 6 Click Score: 17    End of Session Equipment Utilized During Treatment: Gait belt Activity Tolerance: Patient tolerated treatment well Patient left: in chair;with call bell/phone within reach;with family/visitor present Nurse Communication: Mobility status PT Visit Diagnosis: Muscle weakness (generalized) (M62.81);Other abnormalities of gait and mobility (R26.89)     Time: 0076-2263 PT Time Calculation (min) (ACUTE ONLY): 24 min  Charges:  $Gait Training: 23-37 mins                     Wray Kearns, [place], DPT Acute Rehabilitation Services Pager [phone] Office [place] 06/26/2018, 3:16 PM

## 2018-06-26 NOTE — Progress Notes (Signed)
  Speech Language Pathology Treatment: Dysphagia  Patient Details Name: Jeremy [name] MRN: [id number] DOB: 06/03/1945 Today's Date: 06/26/2018 Time: 3419-6222 SLP Time Calculation (min) (ACUTE ONLY): 19 min  Assessment / Plan / Recommendation Clinical Impression  Pt needed Mod cues to recall results of MBS on previous date. SLP also reinforced rationale, as pt has reportedly not been eating much of current diet. Pt consumed bites of applesauce with no overt difficulty, taking small bites at a time and using appropriate pacing with Mod I. He describes persistent irritation in his throat s/p extubation, although his significant other says that his voice sounds better from yesterday to today. She also says that he has been fatiguing easily. Would continue current diet and precautions for now, with repeat MBS indicated pending improved endurance, vocal intensity, and subjective irritation.    HPI HPI: 73 year old male presenting with seizure-like episode, acute loss of consciousness followed by severe combativeness. Intubated 1/29-2/2. MRI of the brain shows subcentimeter left occipital areas of restricted diffusion. PMH including HTN, coronary artery disease, hypercholesterolemia, gastroesophageal reflux disease, and carotid artery stenosis. CXR low volume chest without acute finding.      SLP Plan  Continue with current plan of care       Recommendations  Diet recommendations: Dysphagia 1 (puree);Pudding-thick liquid Liquids provided via: Teaspoon Medication Administration: Crushed with puree Supervision: Patient able to self feed;Full supervision/cueing for compensatory strategies Compensations: Slow rate;Small sips/bites;Clear throat intermittently Postural Changes and/or Swallow Maneuvers: Seated upright 90 degrees                Oral Care Recommendations: Oral care BID Follow up Recommendations: Inpatient Rehab SLP Visit Diagnosis: Dysphagia, pharyngeal phase (R13.13) Plan:  Continue with current plan of care       GO                Venita Sheffield Reade Trefz 06/26/2018, 4:38 PM  Germain Osgood Lindalee Huizinga, M.A. [facility] Acute Environmental education officer [phone] Office [phone]

## 2018-06-27 ENCOUNTER — Inpatient Hospital Stay (HOSPITAL_COMMUNITY): Payer: PPO

## 2018-06-27 DIAGNOSIS — E870 Hyperosmolality and hypernatremia: Secondary | ICD-10-CM

## 2018-06-27 DIAGNOSIS — N183 Chronic kidney disease, stage 3 unspecified: Secondary | ICD-10-CM

## 2018-06-27 DIAGNOSIS — E7800 Pure hypercholesterolemia, unspecified: Secondary | ICD-10-CM

## 2018-06-27 LAB — CBC
HCT: 43.9 % (ref 39.0–52.0)
Hemoglobin: 13.9 g/dL (ref 13.0–17.0)
MCH: 29.8 pg (ref 26.0–34.0)
MCHC: 31.7 g/dL (ref 30.0–36.0)
MCV: 94.2 fL (ref 80.0–100.0)
NRBC: 0 % (ref 0.0–0.2)
Platelets: 224 10*3/uL (ref 150–400)
RBC: 4.66 MIL/uL (ref 4.22–5.81)
RDW: 13 % (ref 11.5–15.5)
WBC: 7.6 10*3/uL (ref 4.0–10.5)

## 2018-06-27 LAB — BASIC METABOLIC PANEL
ANION GAP: 6 (ref 5–15)
Anion gap: 7 (ref 5–15)
BUN: 31 mg/dL — ABNORMAL HIGH (ref 8–23)
BUN: 32 mg/dL — ABNORMAL HIGH (ref 8–23)
CO2: 30 mmol/L (ref 22–32)
CO2: 30 mmol/L (ref 22–32)
Calcium: 9.1 mg/dL (ref 8.9–10.3)
Calcium: 9.3 mg/dL (ref 8.9–10.3)
Chloride: 112 mmol/L — ABNORMAL HIGH (ref 98–111)
Chloride: 110 mmol/L (ref 98–111)
Creatinine, Ser: 1.24 mg/dL (ref 0.61–1.24)
Creatinine, Ser: 1.2 mg/dL (ref 0.61–1.24)
GFR calc Af Amer: >60 mL/min (ref >60)
GFR calc Af Amer: >60 mL/min (ref >60)
GFR calc non Af Amer: 58 mL/min — ABNORMAL LOW (ref >60)
GFR calc non Af Amer: >60 mL/min (ref >60)
Glucose, Bld: 96 mg/dL (ref 70–99)
Glucose, Bld: 111 mg/dL — ABNORMAL HIGH (ref 70–99)
POTASSIUM: 3.4 mmol/L — AB (ref 3.5–5.1)
Potassium: 4 mmol/L (ref 3.5–5.1)
Sodium: 148 mmol/L — ABNORMAL HIGH (ref 135–145)
Sodium: 147 mmol/L — ABNORMAL HIGH (ref 135–145)

## 2018-06-27 LAB — CULTURE, BLOOD (ROUTINE X 2)
Culture: NO GROWTH
Culture: NO GROWTH
Special Requests: ADEQUATE
Special Requests: ADEQUATE

## 2018-06-27 LAB — AMMONIA: Ammonia: 18 umol/L (ref 9–35)

## 2018-06-27 LAB — PHENYTOIN LEVEL, TOTAL: Phenytoin Lvl: 8.3 ug/mL — ABNORMAL LOW (ref 10.0–20.0)

## 2018-06-27 LAB — ALBUMIN: Albumin: 3 g/dL — ABNORMAL LOW (ref 3.5–5.0)

## 2018-06-27 MED ORDER — IOPAMIDOL (ISOVUE-370) INJECTION 76%
75.0000 mL | Freq: Once | INTRAVENOUS | Status: AC | PRN
  Administered 2018-06-27: 75 mL via INTRAVENOUS

## 2018-06-27 MED ORDER — DEXTROSE-NACL 5-0.45 % IV SOLN
INTRAVENOUS | Status: DC
  Administered 2018-06-27: 17:00:00 via INTRAVENOUS

## 2018-06-27 MED ORDER — POTASSIUM CHLORIDE 20 MEQ/15ML (10%) PO SOLN
40.0000 meq | Freq: Once | ORAL | Status: AC
  Administered 2018-06-27: 40 meq via ORAL
  Filled 2018-06-27: qty 30

## 2018-06-27 MED ORDER — PHENYTOIN 125 MG/5ML PO SUSP
125.0000 mg | Freq: Three times a day (TID) | ORAL | Status: DC
  Administered 2018-06-27 – 2018-06-29 (×8): 125 mg via ORAL
  Filled 2018-06-27 (×2): qty 8
  Filled 2018-06-27: qty 5
  Filled 2018-06-27: qty 8
  Filled 2018-06-27 (×3): qty 5
  Filled 2018-06-27: qty 8
  Filled 2018-06-27 (×2): qty 5
  Filled 2018-06-27 (×3): qty 8
  Filled 2018-06-27 (×3): qty 5

## 2018-06-27 MED ORDER — IOPAMIDOL (ISOVUE-370) INJECTION 76%
INTRAVENOUS | Status: AC
  Filled 2018-06-27: qty 100

## 2018-06-27 MED ORDER — CLOPIDOGREL BISULFATE 75 MG PO TABS
75.0000 mg | ORAL_TABLET | Freq: Every day | ORAL | Status: DC
  Administered 2018-06-27 – 2018-06-30 (×4): 75 mg via ORAL
  Filled 2018-06-27 (×4): qty 1

## 2018-06-27 MED ORDER — TRAZODONE HCL 50 MG PO TABS
50.0000 mg | ORAL_TABLET | Freq: Every evening | ORAL | Status: DC | PRN
  Administered 2018-06-27: 50 mg via ORAL
  Filled 2018-06-27: qty 1

## 2018-06-27 MED ORDER — ROSUVASTATIN CALCIUM 20 MG PO TABS
20.0000 mg | ORAL_TABLET | Freq: Every day | ORAL | Status: DC
  Administered 2018-06-27 – 2018-06-29 (×3): 20 mg
  Filled 2018-06-27 (×3): qty 1

## 2018-06-27 NOTE — Progress Notes (Signed)
MEDICATION RELATED CONSULT NOTE   Pharmacy Consult:  Dilantin Indication:  Seizure  Allergies  Allergen Reactions  . Codeine Anaphylaxis  . Oxycontin [Oxycodone Hcl] Shortness Of Breath and Itching  . Morphine And Related Itching    Patient Measurements: Height: 5\' 8"  (172.7 cm) Weight: 212 lb 8.4 oz (96.4 kg) IBW/kg (Calculated) : 68.4  Vital Signs: Temp: 98.3 F (36.8 C) (02/05 0553) Temp Source: Oral (02/05 0553) BP: 164/72 (02/05 0553) Pulse Rate: 69 (02/05 0553) Intake/Output from previous day: 02/04 0701 - 02/05 0700 In: 1709.3 [P.O.:490; I.V.:51; IV Piggyback:1168.3] Out: 350 [Urine:350] Intake/Output from this shift: No intake/output data recorded.  Labs: Recent Labs    06/24/18 1617  06/25/18 1115 06/26/18 0441 06/27/18 0438  WBC  --   --  9.8 9.8 7.6  HGB  --    < > 15.1 14.2 13.9  HCT  --    < > 45.7 43.6 43.9  PLT  --   --  227 211 224  CREATININE  --   --  1.14 1.26* 1.24  MG 1.9  --  2.1 2.3  --   PHOS  --   --  2.7 2.2*  --   ALBUMIN  --   --   --   --  3.0*   < > = values in this interval not displayed.   Estimated Creatinine Clearance: 60.6 mL/min (by C-G formula based on SCr of 1.24 mg/dL).   Microbiology: Recent Results (from the past 720 hour(s))  CSF culture     Status: None   Collection Time: 06/20/18  1:23 PM  Result Value Ref Range Status   Specimen Description CSF  Final   Special Requests NONE  Final   Gram Stain   Final    NO ORGANISMS SEEN WBC PRESENT, PREDOMINANTLY MONONUCLEAR CYTOSPIN SMEAR Gram Stain Report Called to,Read Back By and Verified With: MINTER R. AT 1450 ON [id number] BY THOMPSON S. Performed at [facility], [address], [place], [place] [address]    Culture NO GROWTH  Final   Report Status 06/23/2018 FINAL  Final  MRSA PCR Screening     Status: None   Collection Time: 06/20/18  4:00 PM  Result Value Ref Range Status   MRSA by PCR NEGATIVE NEGATIVE Final    Comment:        The GeneXpert MRSA Assay  (FDA approved for NASAL specimens only), is one component of a comprehensive MRSA colonization surveillance program. It is not intended to diagnose MRSA infection nor to guide or monitor treatment for MRSA infections. Performed at [facility], [place] [address] [place], [place] [address]   Culture, blood (Routine X 2) w Reflex to ID Panel     Status: None (Preliminary result)   Collection Time: 06/22/18  3:05 PM  Result Value Ref Range Status   Specimen Description BLOOD RIGHT HAND  Final   Special Requests   Final    BOTTLES DRAWN AEROBIC ONLY Blood Culture adequate volume   Culture   Final    NO GROWTH 4 DAYS Performed at [facility], [place] [address] [place], [place] [address]    Report Status PENDING  Incomplete  Culture, blood (Routine X 2) w Reflex to ID Panel     Status: None (Preliminary result)   Collection Time: 06/22/18  3:33 PM  Result Value Ref Range Status   Specimen Description BLOOD RIGHT ARM  Final   Special Requests   Final  BOTTLES DRAWN AEROBIC ONLY Blood Culture adequate volume   Culture   Final    NO GROWTH 4 DAYS Performed at [facility], [place] [address], [place], [place] [address]    Report Status PENDING  Incomplete     Assessment: 17 YOM presented with new-onset seizure and started on Keppra, Vimpat and Dilantin.  Corrected Dilantin level is therapeutic at 11.9 mcg/mL.  No further seizure reported; renal function stable.  Goal of Therapy:  DPH level 10-20 mcg/mL  Plan:  Continue DPH 125mg  Q8H, change to PO Vimpat and Keppra per MD Monitor renal fxn, s/sx of seizure, repeat level at new Css if still here and write progress note at that time   Denyla Cortese D. Mina Marble, PharmD, BCPS, [place] 06/27/2018, 7:31 AM

## 2018-06-27 NOTE — Progress Notes (Signed)
Occupational Therapy Treatment Patient Details Name: Jeremy [name] MRN: [id number] DOB: [birth year] Today's Date: 06/27/2018    History of present illness 73 year old male presenting with seizure-like episode, acute loss of consciousness followed by severe combativeness. Intubated 1/29-2/2. MRI of the brain shows subcentimeter left occipital areas of restricted diffusion. PMH including HTN, coronary artery disease, hypercholesterolemia, gastroesophageal reflux disease, and carotid artery stenosis.    OT comments  Pt making progress with functional goals. Pt continues to demo impaired balance and safety with ADL mobility and requires min - min guard A with LB ADLs and transfers. Pt participated in grooming and ADL tasks standing at sink with RW this session. OT will continue to follow acutely  Follow Up Recommendations  CIR;Supervision/Assistance - 24 hour    Equipment Recommendations  Other (comment)(TBD at next venue of care)    Recommendations for Other Services      Precautions / Restrictions Precautions Precautions: Fall       Mobility Bed Mobility               General bed mobility comments: pt in recliner upon arrival  Transfers Overall transfer level: Needs assistance Equipment used: Rolling walker (2 wheeled) Transfers: Sit to/from Stand Sit to Stand: Min assist;Min guard         General transfer comment: verbal cues for correct hand placement    Balance Overall balance assessment: Needs assistance Sitting-balance support: No upper extremity supported;Feet supported Sitting balance-Leahy Scale: Fair     Standing balance support: During functional activity;Bilateral upper extremity supported Standing balance-Leahy Scale: Poor                             ADL either performed or assessed with clinical judgement   ADL Overall ADL's : Needs assistance/impaired     Grooming: Wash/dry hands;Wash/dry face;Oral care;Standing;Min guard   Upper  Body Bathing: Minimal assistance;Standing Upper Body Bathing Details (indicate cue type and reason): simulated     Upper Body Dressing : Min guard;Standing   Lower Body Dressing: Minimal assistance   Toilet Transfer: Minimal assistance;Min guard;Ambulation;RW;Cueing for safety   Toileting- Clothing Manipulation and Hygiene: Min guard;Sit to/from stand       Functional mobility during ADLs: Minimal assistance;Min guard;Cueing for safety       Vision Baseline Vision/History: Wears glasses Wears Glasses: Reading only Patient Visual Report: No change from baseline     Perception     Praxis      Cognition Arousal/Alertness: Awake/alert Behavior During Therapy: WFL for tasks assessed/performed Overall Cognitive Status: Impaired/Different from baseline Area of Impairment: Following commands;Awareness;Problem solving;Safety/judgement;Memory                       Following Commands: Follows one step commands inconsistently;Follows one step commands with increased time     Problem Solving: Slow processing;Requires verbal cues General Comments: Poor safety awareness        Exercises     Shoulder Instructions       General Comments      Pertinent Vitals/ Pain       Pain Assessment: No/denies pain Pain Score: 0-No pain Pain Intervention(s): Monitored during session  Home Living                                          Prior Functioning/Environment  Frequency  Min 2X/week        Progress Toward Goals  OT Goals(current goals can now be found in the care plan section)  Progress towards OT goals: Progressing toward goals     Plan Discharge plan remains appropriate    Co-evaluation                 AM-PAC OT "6 Clicks" Daily Activity     Outcome Measure   Help from another person eating meals?: A Little Help from another person taking care of personal grooming?: A Little Help from another person  toileting, which includes using toliet, bedpan, or urinal?: A Little Help from another person bathing (including washing, rinsing, drying)?: A Lot Help from another person to put on and taking off regular upper body clothing?: A Little Help from another person to put on and taking off regular lower body clothing?: A Lot 6 Click Score: 16    End of Session Equipment Utilized During Treatment: Gait belt;Other (comment)(RW)  OT Visit Diagnosis: Unsteadiness on feet (R26.81);Other abnormalities of gait and mobility (R26.89);Muscle weakness (generalized) (M62.81);Pain;Other symptoms and signs involving cognitive function   Activity Tolerance Patient limited by lethargy;Patient limited by fatigue   Patient Left with call bell/phone within reach;in chair;with chair alarm set   Nurse Communication          Time: 4975-3005 OT Time Calculation (min): 24 min  Charges: OT General Charges $OT Visit: 1 Visit OT Treatments $Self Care/Home Management : 8-22 mins $Therapeutic Activity: 8-22 mins     Britt Bottom 06/27/2018, 11:40 AM

## 2018-06-27 NOTE — NC FL2 (Signed)
[place] LEVEL OF CARE SCREENING TOOL     IDENTIFICATION  Patient Name: Jeremy [name] Birthdate: June 11, 1945 Sex: male Admission Date (Current Location): 06/20/2018  [facility] and [place] Number:  Herbalist and Address:  [facility] [place], [place] [address] [place], [place] [address]      Provider Number: [id number]  Attending Physician Name and Address:  Mariel Aloe, MD  Relative Name and Phone Number:  Maudie Mercury, significant other, [phone]    Current Level of Care: Hospital Recommended Level of Care: [place] Prior Approval Number:    Date Approved/Denied:   PASRR Number: [id number]  Discharge Plan: SNF    Current Diagnoses: Patient Active Problem List   Diagnosis Date Noted  . Respiratory failure ([place])   . Seizure ([place]) 06/20/2018  . Acute encephalopathy 06/20/2018  . Asymptomatic carotid artery stenosis without infarction, right 12/25/2017  . Pre-operative cardiovascular examination 11/03/2017  . Carotid artery disease ([place]) 11/03/2017  . Shoulder joint pain 09/17/2013  . Numbness around mouth 12/01/2011  . Erectile dysfunction 09/05/2010  . TRIGGER FINGER, LEFT MIDDLE 04/20/2010  . ANAL FISSURE 04/03/2009  . Mixed dyslipidemia 04/01/2009  . RECTAL BLEEDING 04/01/2009  . ABDOMINAL PAIN-LLQ 04/01/2009  . ABDOMINAL PAIN, GENERALIZED 03/30/2009  . OTITIS EXTERNA, LEFT 05/07/2008  . BRONCHITIS, ACUTE 04/21/2008  . CARPAL TUNNEL SYNDROME, BILATERAL 12/25/2007  . COLD SORE 03/12/2007  . CAD (coronary artery disease) 01/01/2007  . HYPERCHOLESTEROLEMIA, PURE 06/19/2006  . OBESITY NOS 06/19/2006  . HYPERTENSION 06/19/2006  . MYOCARDIAL INFARCTION, HX OF 06/19/2006  . COLONIC POLYPS, HX OF 06/19/2006    Orientation RESPIRATION BLADDER Height & Weight     Self, Time, Situation, Place  Normal Continent Weight: 96.4 kg Height:  5\' 8"  (172.7 cm)  BEHAVIORAL SYMPTOMS/MOOD NEUROLOGICAL BOWEL  NUTRITION STATUS      Continent Diet(please see DC summary)  AMBULATORY STATUS COMMUNICATION OF NEEDS Skin   Limited Assist Verbally Normal                       Personal Care Assistance Level of Assistance  Bathing, Feeding, Dressing Bathing Assistance: Limited assistance Feeding assistance: Independent Dressing Assistance: Limited assistance     Functional Limitations Info  Sight, Hearing, Speech Sight Info: Adequate Hearing Info: Adequate Speech Info: Adequate    SPECIAL CARE FACTORS FREQUENCY  PT (By licensed PT), OT (By licensed OT)     PT Frequency: 5x/week OT Frequency: 5x/week            Contractures Contractures Info: Not present    Additional Factors Info  Code Status, Allergies Code Status Info: Full Allergies Info: Codeine, Oxycontin Oxycodone Hcl, Morphine And Related           Current Medications (06/27/2018):  This is the current hospital active medication list Current Facility-Administered Medications  Medication Dose Route Frequency Provider Last Rate Last Dose  . aspirin chewable tablet 81 mg  81 mg Per Tube Daily Desai, Rahul P, PA-C   81 mg at 06/27/18 1032  . carvedilol (COREG) tablet 12.5 mg  12.5 mg Oral BID WC Icard, Bradley L, DO   12.5 mg at 06/27/18 1032  . clopidogrel (PLAVIX) tablet 75 mg  75 mg Oral Daily Mariel Aloe, MD      . famotidine (PEPCID) 40 MG/5ML suspension 20 mg  20 mg Oral BID Elgergawy, Silver Huguenin, MD   20 mg at 06/27/18 1029  . gabapentin (NEURONTIN) 250  MG/5ML solution 300 mg  300 mg Per Tube QHS Anders Simmonds, MD   300 mg at 06/26/18 2137  . heparin injection 5,000 Units  5,000 Units Subcutaneous Q8H Desai, Rahul P, PA-C   5,000 Units at 06/27/18 0556  . hydrALAZINE (APRESOLINE) injection 5 mg  5 mg Intravenous Q4H PRN Elgergawy, Silver Huguenin, MD      . ibuprofen (ADVIL,MOTRIN) 100 MG/5ML suspension 400 mg  400 mg Oral Q6H PRN Anders Simmonds, MD   400 mg at 06/26/18 6010  . ipratropium-albuterol (DUONEB) 0.5-2.5  (3) MG/3ML nebulizer solution 3 mL  3 mL Nebulization BID Elgergawy, Silver Huguenin, MD   3 mL at 06/27/18 0831  . isosorbide-hydrALAZINE (BIDIL) 20-37.5 MG per tablet 1 tablet  1 tablet Oral TID Elgergawy, Silver Huguenin, MD   1 tablet at 06/27/18 1032  . labetalol (NORMODYNE,TRANDATE) injection 20 mg  20 mg Intravenous Q10 min PRN Icard, Bradley L, DO   20 mg at 06/26/18 0953  . lacosamide (VIMPAT) tablet 100 mg  100 mg Oral BID Dang, Thuy D, RPH   100 mg at 06/27/18 1031  . levETIRAcetam (KEPPRA) 100 MG/ML solution 500 mg  500 mg Oral BID Dang, Thuy D, RPH   500 mg at 06/27/18 1029  . multivitamin with minerals tablet 1 tablet  1 tablet Per Tube Daily Mannam, Praveen, MD   1 tablet at 06/27/18 1032  . ondansetron (ZOFRAN) injection 4 mg  4 mg Intravenous Q6H PRN Juanito Doom, MD      . phenytoin (DILANTIN) 125 MG/5ML suspension 125 mg  125 mg Oral TID Dang, Thuy D, RPH      . polyvinyl alcohol (LIQUIFILM TEARS) 1.4 % ophthalmic solution 1 drop  1 drop Both Eyes PRN Icard, Bradley L, DO   1 drop at 06/26/18 0942  . RESOURCE THICKENUP CLEAR   Oral PRN Icard, Bradley L, DO      . rosuvastatin (CRESTOR) tablet 20 mg  20 mg Per Tube q1800 Mariel Aloe, MD      . sodium chloride flush (NS) 0.9 % injection 10-40 mL  10-40 mL Intracatheter PRN Simonne Maffucci B, MD      . traZODone (DESYREL) tablet 50 mg  50 mg Oral QHS PRN Mariel Aloe, MD         Discharge Medications: Please see discharge summary for a list of discharge medications.  Relevant Imaging Results:  Relevant Lab Results:   Additional Information SSN: [id number]  Estanislado Emms, LCSW

## 2018-06-27 NOTE — Progress Notes (Addendum)
To complete stroke work up the patient will need CTA of head and neck. Loop recorder as an outpatient needed to evaluate for possible intermittent a-fib. Add Plavix to ASA. Continue Crestor or switch to atorvastatin 40 mg po qd.   Stroke team to follow in the AM.   Electronically signed: Dr. Kerney Elbe

## 2018-06-27 NOTE — Clinical Social Work Note (Addendum)
Clinical Social Work Assessment  Patient Details  Name: Jeremy [name] MRN: [id number] Date of Birth: 09-06-1945  Date of referral:  06/27/18               Reason for consult:  Facility Placement, Discharge Planning                Permission sought to share information with:  Facility Sport and exercise psychologist, Family Supports Permission granted to share information::  Yes, Verbal Permission Granted  Name::     Jeremy [name]  Agency::  SNFs  Relationship::  significant other  Contact Information:  [phone]  Housing/Transportation Living arrangements for the past 2 months:  [place] of Information:  Patient Patient Interpreter Needed:  None Criminal Activity/Legal Involvement Pertinent to Current Situation/Hospitalization:  No - Comment as needed Significant Relationships:  Adult Children, Significant Other Lives with:  Self Do you feel safe going back to the place where you live?  Yes Need for family participation in patient care:  Yes (Comment)  Care giving concerns: Patient from home independently. PT recommending CIR, but unfortunately patient's insurance has denied coverage for CIR. CSW consulting for SNF backup.   Social Worker assessment / plan: CSW met with patient and significant other, Jeremy [name], at bedside. Patient alert and oriented, sitting up in bedside chair. CSW introduced self and role and discussed disposition planning.   Patient and Jeremy [name] understand that patient has been denied for CIR. They are agreeable to SNF and prefer [facility] in [place]. CSW explained referral process and insurance authorization process.   Also discussed alternate disposition options if patient is also denied by insurance for SNF. Jeremy [name] indicated patient would likely need to come home with her for a while if denied for SNF and they would want home health. However, she works during the day and patient would not have assistance during the time she was at  work.  CSW sent out initial SNF referrals and placed call to [facility]. They are reviewing the referral and will follow up for bed offer. Called [employer] to request to start SNF auth; patient's CIR auth case has not yet been closed out, awaiting call back from [employer] to start SNF auth.  CSW to follow and support with discharge planning.  Update 2:15 pm: [facility] is not in network with [employer]. CSW has sent referrals to in-network facilities. Did update Jeremy [name] by phone about this, and she is understanding, and agreeable to a facility in [place] or [place], pending bed availability. Awaiting bed offers and insurance.  Employment status:  Retired Char[employer] fundraiser) PT Recommendations:  Inpatient [place] / Referral to community resources:  [place]  Patient/Family's Response to care: Patient and significant other appreciative of care.  Patient/Family's Understanding of and Emotional Response to Diagnosis, Current Treatment, and Prognosis: Patient and significant other with understanding of patient's condition and care needs. Now that he has been denied for CIR, they are hopeful for rehab at [facility].  Emotional Assessment Appearance:  Appears stated age Attitude/Demeanor/Rapport:  Engaged Affect (typically observed):  Accepting, Calm, Appropriate Orientation:  Oriented to Self, Oriented to Place, Oriented to  Time, Oriented to Situation Alcohol / Substance use:  Not Applicable Psych involvement (Current and /or in the community):  No (Comment)  Discharge Needs  Concerns to be addressed:  Discharge Planning Concerns, Care Coordination Readmission within the last 30 days:  No Current discharge risk:  Physical Impairment, Lives alone Barriers to Discharge:  Continued Medical Work up, [place], LCSW 06/27/2018, 1:53 PM

## 2018-06-27 NOTE — Progress Notes (Signed)
Inpatient Rehabilitation-Admissions Coordinator   Met with pt to discuss CIR program. Pt and family interested in program and wanting to pursue. Unfortunately, pt's insurance has denied his request for CIR. Despite peer to peer conference with our admitting PM&R MD, Dr. Naaman Plummer, the denial was upheld. Discussed determination with pt and family who would like to pursue SNF for rehab at this time.   AC has communicated this information and pt request for a specific SNF with CM/SW.   AC to sign off.   Please call if questions.   Jhonnie Garner, OTR/L  Rehab Admissions Coordinator  [phone] 06/27/2018 1:04 PM

## 2018-06-27 NOTE — Progress Notes (Signed)
PT Cancellation Note  Patient Details Name: Jeremy [name] MRN: [id number] DOB: [birth year]   Cancelled Treatment:    Reason Eval/Treat Not Completed: Patient at procedure or test/unavailable (CT). Will follow-up for PT treatment as schedule permits.  Mabeline Caras, PT, DPT Acute Rehabilitation Services  Pager [phone] Office [place] 06/27/2018, 2:45 PM

## 2018-06-27 NOTE — Progress Notes (Signed)
PROGRESS NOTE    Jeremy [name]  GUR:[id number] DOB: [birth year] DOA: 06/20/2018 PCP: Celene Squibb, MD   Brief Narrative: Jeremy [name] is a 73 y.o. male with a history of CVA, hypertension. Patient presented secondary to a seizure-like episode. He required intubation for airway protection and started on AEDs. Extubated on 06/24/18. Acute stroke noticed on MRI from 06/20/18. Now undergoing stroke workup. Transthoracic Echocardiogram performed.    Assessment & Plan:   Active Problems:   Seizure ([place])   Acute encephalopathy   Respiratory failure ([place])   Seizure Clinical suspicion based on history. EEG was not significant for seizure at the time of study. Thought secondary to known history of stroke. -Vimpat 100 BID, Keppra 500 mg BID, phenytoin 125 mg TID  Acute encephalopathy Likely metabolic from seizure but unknown.  Acute respiratory failure with hypoxia Patient required mechanical ventilation from 1/29 to 2/2. Now on 2L via nasal canula. -Wean to room air  Acute/subacute occipital lobe CVA Per neuro, thought to be embolic. Transthoracic Echocardiogram significant for no thrombus. Discussed with neurology today. Plan for full stroke workup. -Continue aspirin and increase to Crestor 20 mg -Neurology recommendations: CTA head/neck, increase statin therapy, DAPT, likely need Transesophageal Echocardiogram w/loop, stroke team to see  Essential hypertension -Continue Coreg and Imdur  Hyperlipidemia History of CAD -Continue Crestor and aspirin  CKD stage III Stable  History of stroke -Crestor, aspirin as mentioned above  Sleeplessness Has this issue as an outpatient at times. Takes medication but is unsure of what he takes. -Trazodone  Hypernatremia Secondary to poor oral fluid intake -D5 1/2 NS IV fluids  Hypokalemia -potassium supplementation   DVT prophylaxis: Heparin subq Code Status:   Code Status: Full Code Family Communication: Girlfriend at  bedside Disposition Plan: Discharge to SNF (insurance declined CIR)   Consultants:   Neurology  Procedures:   Transthoracic Echocardiogram (06/22/2018) IMPRESSIONS    1. The left ventricle has moderately reduced systolic function of 28-31%. The cavity size is normal. There is severe left ventricular wall thickness. Echo evidence of pseudonormal diastolic filling patterns.  2. Normal left atrial size.  3. Normal right atrial size.  4. The mitral valve normal in structure. Regurgitation is mild by color flow Doppler.  5. Normal tricuspid valve.  6. Tricuspid regurgitation is mild.  7. The aortic valve tricuspid. There is mild thickening of the aortic valve.  8. No atrial level shunt detected by color flow Doppler.  Antimicrobials:  Ceftriaxone (1/29)    Subjective: Some issues with sleeping.  Objective: Vitals:   06/27/18 0500 06/27/18 0553 06/27/18 0831 06/27/18 1029  BP:  (!) 164/72  (!) 174/68  Pulse:  69  76  Resp:  14    Temp:  98.3 F (36.8 C)    TempSrc:  Oral    SpO2:  97% 93%   Weight: 96.4 kg     Height:        Intake/Output Summary (Last 24 hours) at 06/27/2018 1308 Last data filed at 06/27/2018 0900 Gross per 24 hour  Intake 1000.27 ml  Output 350 ml  Net 650.27 ml   Filed Weights   06/22/18 0500 06/25/18 0500 06/27/18 0500  Weight: 106.7 kg 97 kg 96.4 kg    Examination:  General exam: Appears calm and comfortable Respiratory system: Clear to auscultation. Respiratory effort normal. Cardiovascular system: S1 & S2 heard, RRR. No murmurs, rubs, gallops or clicks. Gastrointestinal system: Abdomen is nondistended, soft and nontender. No organomegaly or masses felt. Normal bowel  sounds heard. Central nervous system: Alert and oriented. CN intact. Strength intact Extremities: No edema. No calf tenderness Skin: No cyanosis. No rashes Psychiatry: Judgement and insight appear normal. Mood & affect appropriate.     Data Reviewed: I have personally  reviewed following labs and imaging studies  CBC: Recent Labs  Lab 06/21/18 0317  06/23/18 0905 06/23/18 1230 06/24/18 2035 06/25/18 1115 06/26/18 0441 06/27/18 0438  WBC 11.4*  --  9.5  --   --  9.8 9.8 7.6  HGB 13.6   < > 13.1 12.6* 13.6 15.1 14.2 13.9  HCT 41.9   < > 40.7 37.0* 40.0 45.7 43.6 43.9  MCV 92.9  --  95.8  --   --  91.8 92.2 94.2  PLT 194  --  168  --   --  227 211 224   < > = values in this interval not displayed.   Basic Metabolic Panel: Recent Labs  Lab 06/22/18 1001 06/22/18 1533 06/23/18 0626  06/23/18 1230 06/23/18 1623 06/24/18 0519 06/24/18 1617 06/24/18 2035 06/25/18 1115 06/26/18 0441 06/27/18 0438  NA 143  --  144   < > 142  --   --   --  143 146* 146* 148*  K 4.1  --  5.1   < > 3.8  --   --   --  3.1* 3.2* 3.1* 3.4*  CL 117*  --  118*  --   --   --   --   --   --  105 108 112*  CO2 18*  --  17*  --   --   --   --   --   --  27 26 30   GLUCOSE 97  --  101*  --   --   --   --   --   --  103* 125* 96  BUN 19  --  27*  --   --   --   --   --   --  32* 42* 31*  CREATININE 1.24  --  1.26*  --   --   --   --   --   --  1.14 1.26* 1.24  CALCIUM 8.0*  --  8.1*  --   --   --   --   --   --  9.2 9.0 9.1  MG  --   --  2.2  --   --  2.1 2.1 1.9  --  2.1 2.3  --   PHOS  --  2.2* 4.1  --   --  3.2  --   --   --  2.7 2.2*  --    < > = values in this interval not displayed.   GFR: Estimated Creatinine Clearance: 60.6 mL/min (by C-G formula based on SCr of 1.24 mg/dL). Liver Function Tests: Recent Labs  Lab 06/20/18 1844 06/27/18 0438  AST 45*  --   ALT 55*  --   ALKPHOS 40  --   BILITOT 0.8  --   PROT 5.8*  --   ALBUMIN 3.3* 3.0*   No results for input(s): LIPASE, AMYLASE in the last 168 hours. Recent Labs  Lab 06/21/18 0540 06/27/18 0438  AMMONIA 20 18   Coagulation Profile: No results for input(s): INR, PROTIME in the last 168 hours. Cardiac Enzymes: Recent Labs  Lab 06/20/18 1844 06/21/18 0042 06/21/18 0317 06/21/18 1132  06/23/18 0626  TROPONINI 2.56* 2.23* 1.82* 1.37* 1.01*   BNP (last  3 results) No results for input(s): PROBNP in the last 8760 hours. HbA1C: No results for input(s): HGBA1C in the last 72 hours. CBG: Recent Labs  Lab 06/24/18 2322 06/25/18 0319 06/25/18 0808 06/25/18 1219 06/25/18 1559  GLUCAP 100* 97 101* 101* 111*   Lipid Profile: No results for input(s): CHOL, HDL, LDLCALC, TRIG, CHOLHDL, LDLDIRECT in the last 72 hours. Thyroid Function Tests: No results for input(s): TSH, T4TOTAL, FREET4, T3FREE, THYROIDAB in the last 72 hours. Anemia Panel: No results for input(s): VITAMINB12, FOLATE, FERRITIN, TIBC, IRON, RETICCTPCT in the last 72 hours. Sepsis Labs: No results for input(s): PROCALCITON, LATICACIDVEN in the last 168 hours.  Recent Results (from the past 240 hour(s))  CSF culture     Status: None   Collection Time: 06/20/18  1:23 PM  Result Value Ref Range Status   Specimen Description CSF  Final   Special Requests NONE  Final   Gram Stain   Final    NO ORGANISMS SEEN WBC PRESENT, PREDOMINANTLY MONONUCLEAR CYTOSPIN SMEAR Gram Stain Report Called to,Read Back By and Verified With: MINTER R. AT 1450 ON [id number] BY THOMPSON S. Performed at [facility], [address] [place], [place] [address]    Culture NO GROWTH  Final   Report Status 06/23/2018 FINAL  Final  MRSA PCR Screening     Status: None   Collection Time: 06/20/18  4:00 PM  Result Value Ref Range Status   MRSA by PCR NEGATIVE NEGATIVE Final    Comment:        The GeneXpert MRSA Assay (FDA approved for NASAL specimens only), is one component of a comprehensive MRSA colonization surveillance program. It is not intended to diagnose MRSA infection nor to guide or monitor treatment for MRSA infections. Performed at [facility], [place] [address], [place], [place] [address]   Culture, blood (Routine X 2) w Reflex to ID Panel     Status: None (Preliminary result)   Collection Time: 06/22/18  3:05  PM  Result Value Ref Range Status   Specimen Description BLOOD RIGHT HAND  Final   Special Requests   Final    BOTTLES DRAWN AEROBIC ONLY Blood Culture adequate volume   Culture   Final    NO GROWTH 4 DAYS Performed at [facility], [place] [address], [place], [place] [address]    Report Status PENDING  Incomplete  Culture, blood (Routine X 2) w Reflex to ID Panel     Status: None (Preliminary result)   Collection Time: 06/22/18  3:33 PM  Result Value Ref Range Status   Specimen Description BLOOD RIGHT ARM  Final   Special Requests   Final    BOTTLES DRAWN AEROBIC ONLY Blood Culture adequate volume   Culture   Final    NO GROWTH 4 DAYS Performed at [facility], [place] [address], [place], [place] [address]    Report Status PENDING  Incomplete         Radiology Studies: Dg Swallowing Func-speech Pathology  Result Date: 06/25/2018 Objective Swallowing Evaluation: Type of Study: MBS-Modified Barium Swallow Study  Patient Details Name: AVYAY COGER MRN: [id number] Date of Birth: 20-Dec-1945 Today's Date: 06/25/2018 Time: SLP Start Time (ACUTE ONLY): 7371 -SLP Stop Time (ACUTE ONLY): 1343 SLP Time Calculation (min) (ACUTE ONLY): 20 min Past Medical History: Past Medical History: Diagnosis Date . Arthritis  . Carotid stenosis, asymptomatic, right  . Carpal tunnel syndrome   bilateral . Coronary artery disease  . GERD (  gastroesophageal reflux disease)  . History of kidney stones  . Hx of colonic polyp  . Hypercholesterolemia  . Hypertension  . Myocardial infarction ([place])   hx of . Obesity  Past Surgical History: Past Surgical History: Procedure Laterality Date . CARDIAC CATHETERIZATION   . CORONARY STENT PLACEMENT  2000  x3  . ENDARTERECTOMY Right 12/25/2017  Procedure: ENDARTERECTOMY CAROTID RIGHT;  Surgeon: Rosetta Posner, MD;  Location: [facility];  Service: Vascular;  Laterality: Right; . PATCH ANGIOPLASTY Right 12/25/2017  Procedure: PATCH ANGIOPLASTY RIGHT CAROTID ARTERY;  Surgeon: Rosetta Posner, MD;  Location: [facility] OR;  Service: Vascular;  Laterality: Right; HPI: 73 year old male presenting with seizure-like episode, acute loss of consciousness followed by severe combativeness. Intubated 1/29-2/2. MRI of the brain shows subcentimeter left occipital areas of restricted diffusion. PMH including HTN, coronary artery disease, hypercholesterolemia, gastroesophageal reflux disease, and carotid artery stenosis. CXR low volume chest without acute finding.  No data recorded Assessment / Plan / Recommendation CHL IP CLINICAL IMPRESSIONS 06/25/2018 Clinical Impression Pt exhibited mild-moderate oropharyngeal dysphagia with aspiration and penetration of nectar and honey thick boluses. Oral phase marked by mildly decreased cohesion and coordination of transit with thinner boluses. Pt's ability to elevate larynx and deflect epiglottis to protect airway is adequate however timing to initiate protective mechanisms was suboptimal. Nectar barium was silently aspirated during the swallow and honey thick substantially penetrated despite chin tuck head position, modification of bolus size or presentation. Recommend pt initiate Dys 1 (puree) consistency and nothing thinner than puree (liquids thickened to pudding consistency), alert for po's, upright, small bites and throat clear intermittently.      SLP Visit Diagnosis Dysphagia, pharyngeal phase (R13.13) Attention and concentration deficit following -- Frontal lobe and executive function deficit following -- Impact on safety and function Moderate aspiration risk;Severe aspiration risk   CHL IP TREATMENT RECOMMENDATION 06/25/2018 Treatment Recommendations Therapy as outlined in treatment plan below   Prognosis 06/25/2018 Prognosis for Safe Diet Advancement Good Barriers to Reach Goals -- Barriers/Prognosis Comment -- CHL IP DIET RECOMMENDATION 06/25/2018 SLP Diet Recommendations Pudding thick liquid;Dysphagia 1 (Puree) solids Liquid Administration via -- Medication Administration  Crushed with puree Compensations Slow rate;Small sips/bites;Clear throat intermittently Postural Changes Seated upright at 90 degrees   CHL IP OTHER RECOMMENDATIONS 06/25/2018 Recommended Consults -- Oral Care Recommendations Oral care BID Other Recommendations Order thickener from pharmacy   CHL IP FOLLOW UP RECOMMENDATIONS 06/25/2018 Follow up Recommendations Home health SLP   CHL IP FREQUENCY AND DURATION 06/25/2018 Speech Therapy Frequency (ACUTE ONLY) min 2x/week Treatment Duration 2 weeks      CHL IP ORAL PHASE 06/25/2018 Oral Phase Impaired Oral - Pudding Teaspoon -- Oral - Pudding Cup -- Oral - Honey Teaspoon Decreased bolus cohesion Oral - Honey Cup Decreased bolus cohesion Oral - Nectar Teaspoon -- Oral - Nectar Cup Decreased bolus cohesion Oral - Nectar Straw -- Oral - Thin Teaspoon -- Oral - Thin Cup -- Oral - Thin Straw -- Oral - Puree WFL Oral - Mech Soft -- Oral - Regular -- Oral - Multi-Consistency -- Oral - Pill -- Oral Phase - Comment --  CHL IP PHARYNGEAL PHASE 06/25/2018 Pharyngeal Phase Impaired Pharyngeal- Pudding Teaspoon -- Pharyngeal -- Pharyngeal- Pudding Cup -- Pharyngeal -- Pharyngeal- Honey Teaspoon Penetration/Aspiration during swallow Pharyngeal Material enters airway, remains ABOVE vocal cords and not ejected out Pharyngeal- Honey Cup Penetration/Aspiration during swallow Pharyngeal Material enters airway, remains ABOVE vocal cords and not ejected out Pharyngeal- Nectar Teaspoon -- Pharyngeal -- Pharyngeal-  Nectar Cup Penetration/Aspiration during swallow;Other (Comment) Pharyngeal Material enters airway, passes BELOW cords without attempt by patient to eject out (silent aspiration) Pharyngeal- Nectar Straw -- Pharyngeal -- Pharyngeal- Thin Teaspoon -- Pharyngeal -- Pharyngeal- Thin Cup -- Pharyngeal -- Pharyngeal- Thin Straw -- Pharyngeal -- Pharyngeal- Puree WFL Pharyngeal -- Pharyngeal- Mechanical Soft -- Pharyngeal -- Pharyngeal- Regular -- Pharyngeal -- Pharyngeal- Multi-consistency --  Pharyngeal -- Pharyngeal- Pill -- Pharyngeal -- Pharyngeal Comment --  CHL IP CERVICAL ESOPHAGEAL PHASE 06/25/2018 Cervical Esophageal Phase WFL Pudding Teaspoon -- Pudding Cup -- Honey Teaspoon -- Honey Cup -- Nectar Teaspoon -- Nectar Cup -- Nectar Straw -- Thin Teaspoon -- Thin Cup -- Thin Straw -- Puree -- Mechanical Soft -- Regular -- Multi-consistency -- Pill -- Cervical Esophageal Comment -- Houston Siren 06/25/2018, 2:56 PM Orbie Pyo Litaker M.Ed Actor Pager [phone] Office [phone]                   Scheduled Meds: . aspirin  81 mg Per Tube Daily  . carvedilol  12.5 mg Oral BID WC  . famotidine  20 mg Oral BID  . gabapentin  300 mg Per Tube QHS  . heparin injection (subcutaneous)  5,000 Units Subcutaneous Q8H  . ipratropium-albuterol  3 mL Nebulization BID  . isosorbide-hydrALAZINE  1 tablet Oral TID  . lacosamide  100 mg Oral BID  . levETIRAcetam  500 mg Oral BID  . multivitamin with minerals  1 tablet Per Tube Daily  . phenytoin  125 mg Oral TID  . rosuvastatin  10 mg Per Tube q1800   Continuous Infusions:   LOS: 7 days     Cordelia Poche, MD [facility] 06/27/2018, 1:08 PM  If 7PM-7AM, please contact night-coverage [email or web address]

## 2018-06-27 NOTE — Care Management Important Message (Signed)
Important Message  Patient Details  Name: Jeremy [name] MRN: [id number] Date of Birth: 01-04-1946   Medicare Important Message Given:  Yes    Jeremy [name] 06/27/2018, 2:37 PM

## 2018-06-28 ENCOUNTER — Other Ambulatory Visit: Payer: Self-pay

## 2018-06-28 DIAGNOSIS — I5041 Acute combined systolic (congestive) and diastolic (congestive) heart failure: Secondary | ICD-10-CM

## 2018-06-28 DIAGNOSIS — B370 Candidal stomatitis: Secondary | ICD-10-CM

## 2018-06-28 DIAGNOSIS — I6330 Cerebral infarction due to thrombosis of unspecified cerebral artery: Secondary | ICD-10-CM

## 2018-06-28 LAB — BASIC METABOLIC PANEL
Anion gap: 11 (ref 5–15)
BUN: 28 mg/dL — ABNORMAL HIGH (ref 8–23)
CALCIUM: 8.8 mg/dL — AB (ref 8.9–10.3)
CO2: 25 mmol/L (ref 22–32)
Chloride: 109 mmol/L (ref 98–111)
Creatinine, Ser: 1.21 mg/dL (ref 0.61–1.24)
GFR calc Af Amer: >60 mL/min (ref >60)
GFR, EST NON AFRICAN AMERICAN: 59 mL/min — AB (ref >60)
Glucose, Bld: 95 mg/dL (ref 70–99)
Potassium: 3.8 mmol/L (ref 3.5–5.1)
Sodium: 145 mmol/L (ref 135–145)

## 2018-06-28 MED ORDER — LEVETIRACETAM 500 MG PO TABS
500.0000 mg | ORAL_TABLET | Freq: Two times a day (BID) | ORAL | Status: DC
  Administered 2018-06-28 – 2018-06-30 (×5): 500 mg via ORAL
  Filled 2018-06-28 (×5): qty 1

## 2018-06-28 MED ORDER — FAMOTIDINE 20 MG PO TABS
20.0000 mg | ORAL_TABLET | Freq: Two times a day (BID) | ORAL | Status: DC
  Administered 2018-06-28 – 2018-06-30 (×5): 20 mg via ORAL
  Filled 2018-06-28 (×5): qty 1

## 2018-06-28 MED ORDER — LOSARTAN POTASSIUM 25 MG PO TABS
25.0000 mg | ORAL_TABLET | Freq: Every day | ORAL | Status: DC
  Administered 2018-06-28 – 2018-06-30 (×3): 25 mg via ORAL
  Filled 2018-06-28 (×3): qty 1

## 2018-06-28 MED ORDER — NYSTATIN 100000 UNIT/ML MT SUSP: 5.0000 mL | Freq: Four times a day (QID) | OROMUCOSAL | Status: DC

## 2018-06-28 MED ORDER — GABAPENTIN 300 MG PO CAPS
300.0000 mg | ORAL_CAPSULE | Freq: Two times a day (BID) | ORAL | Status: DC
  Administered 2018-06-28 – 2018-06-30 (×5): 300 mg via ORAL
  Filled 2018-06-28 (×5): qty 1

## 2018-06-28 MED ORDER — MAGIC MOUTHWASH
5.0000 mL | Freq: Four times a day (QID) | ORAL | Status: DC
  Administered 2018-06-28 – 2018-06-29 (×7): 5 mL via ORAL
  Filled 2018-06-28 (×10): qty 5

## 2018-06-28 NOTE — Consult Note (Addendum)
Cardiology Consultation:   Patient ID: Jeremy [name] MRN: [id number]; DOB: [birth year]  Admit date: 06/20/2018 Date of Consult: 06/28/2018  Primary Care Provider: Celene Squibb, MD Primary Cardiologist: Jenean Lindau, MD  Primary Electrophysiologist:  None    Patient Profile:   Jeremy [name] is a 73 y.o. male with a hx of carotid artery stenosis s/p R CEA (12/25/17), CAD s/p 2 stents (15 years ago), HTN, HLD, CKD stage II, and hx of CVA who is being seen today for the evaluation of new onset systolic heart failure at the request of Dr. Lonny Prude.  History of Present Illness:   Mr. Blanke recently re-established cardiac care with Dr. Geraldo Pitter for preoperative clearance for his CEA (11/03/17). At that time, he underwent nuclear stress test which was negative for reversible ischemia and he successfully underwent R CEA. Echo 11/2017 with normal EF.    He presented on 06/20/18 with reported seizure requiring intubation. Head CT negative and CTA negative for PE. Ammonia was 122. MRI brain with acute early subacute infarction. He was extubated 06/24/18. Stroke workup initiated. Echo with EF of 35-40%. Pt currently on ASA, plavix, coreg, bidil, and losartan.   On my interview, he denies chest pain, SOB, orthopnea, lower extremity swelling, and recent illness. He states he felt fine prior to his seizure. Troponins were trended and found to be elevated on 06/20/18: 2.56 --> 2.23 --> 1.82 --> 1.37 --> 1.01. EKG with tachycardia and ST abnormalities following seizure and while intubated.   Home meds: enalapril, losartan, lopressor, crestor.    Past Medical History:  Diagnosis Date  . Arthritis   . Carotid stenosis, asymptomatic, right   . Carpal tunnel syndrome    bilateral  . Coronary artery disease   . GERD (gastroesophageal reflux disease)   . History of kidney stones   . Hx of colonic polyp   . Hypercholesterolemia   . Hypertension   . Myocardial infarction ([place])    hx of  . Obesity     Past  Surgical History:  Procedure Laterality Date  . CARDIAC CATHETERIZATION    . CORONARY STENT PLACEMENT  2000   x3   . ENDARTERECTOMY Right 12/25/2017   Procedure: ENDARTERECTOMY CAROTID RIGHT;  Surgeon: Rosetta Posner, MD;  Location: [facility];  Service: Vascular;  Laterality: Right;  . PATCH ANGIOPLASTY Right 12/25/2017   Procedure: PATCH ANGIOPLASTY RIGHT CAROTID ARTERY;  Surgeon: Rosetta Posner, MD;  Location: [facility];  Service: Vascular;  Laterality: Right;     Home Medications:  Prior to Admission medications   Medication Sig Start Date End Date Taking? Authorizing Provider  aspirin EC 81 MG tablet Take 81 mg by mouth daily.   Yes [provider]  clobetasol cream (TEMOVATE) 1.70 % Apply 1 application topically as needed (on affected area on skin).  07/11/13  Yes [provider]  diclofenac (VOLTAREN) 75 MG EC tablet Take 75 mg by mouth 2 (two) times daily.    Yes [provider]  enalapril (VASOTEC) 5 MG tablet Take 5 mg by mouth daily.  08/26/13  Yes [provider]  gabapentin (NEURONTIN) 300 MG capsule Take 300 mg by mouth daily as needed for pain. 11/30/17  Yes [provider]  ketoconazole (NIZORAL) 2 % shampoo Apply 1 application topically 2 (two) times a week.  03/05/18  Yes [provider]  losartan (COZAAR) 100 MG tablet Take 100 mg by mouth daily.  05/14/18  Yes [provider]  metoprolol tartrate (  LOPRESSOR) 50 MG tablet Take 50 mg by mouth 2 (two) times daily.   Yes [provider]  nitroGLYCERIN (NITROSTAT) 0.4 MG SL tablet Place 0.4 mg under the tongue every 5 (five) minutes as needed for chest pain.    Yes [provider]  prochlorperazine (COMPAZINE) 10 MG tablet Take 1 tablet (10 mg total) by mouth 2 (two) times daily as needed (Headache). 10/27/17  Yes Upstill, Nehemiah Settle, PA-C  rosuvastatin (CRESTOR) 10 MG tablet Take 10 mg by mouth daily.  03/05/18  Yes [provider]  traMADol (ULTRAM) 50 MG tablet  Take 1 tablet (50 mg total) by mouth every 6 (six) hours as needed for moderate pain. 12/26/17  Yes Rhyne, Hulen Shouts, PA-C    Inpatient Medications: Scheduled Meds: . aspirin  81 mg Per Tube Daily  . carvedilol  12.5 mg Oral BID WC  . clopidogrel  75 mg Oral Daily  . famotidine  20 mg Oral BID  . gabapentin  300 mg Oral BID  . heparin injection (subcutaneous)  5,000 Units Subcutaneous Q8H  . ipratropium-albuterol  3 mL Nebulization BID  . isosorbide-hydrALAZINE  1 tablet Oral TID  . lacosamide  100 mg Oral BID  . levETIRAcetam  500 mg Oral BID  . losartan  25 mg Oral Daily  . magic mouthwash  5 mL Oral QID  . multivitamin with minerals  1 tablet Per Tube Daily  . phenytoin  125 mg Oral TID  . rosuvastatin  20 mg Per Tube q1800   Continuous Infusions:  PRN Meds: hydrALAZINE, ibuprofen, labetalol, ondansetron (ZOFRAN) IV, polyvinyl alcohol, RESOURCE THICKENUP CLEAR, sodium chloride flush, traZODone  Allergies:    Allergies  Allergen Reactions  . Codeine Anaphylaxis  . Oxycontin [Oxycodone Hcl] Shortness Of Breath and Itching  . Morphine And Related Itching    Social History:   Social History   Socioeconomic History  . Marital status: Divorced    Spouse name: Not on file  . Number of children: Not on file  . Years of education: Not on file  . Highest education level: Not on file  Occupational History  . Occupation: retired    Comment: Building control surveyor  Social Needs  . Financial resource strain: Not on file  . Food insecurity:    Worry: Not on file    Inability: Not on file  . Transportation needs:    Medical: Not on file    Non-medical: Not on file  Tobacco Use  . Smoking status: Former Smoker    Last attempt to quit: 05/23/1990    Years since quitting: 28.1  . Smokeless tobacco: Never Used  . Tobacco comment: smoked 2 packs per day for 20 years  Substance and Sexual Activity  . Alcohol use: No  . Drug use: No  . Sexual activity: Not on file  Lifestyle  . Physical  activity:    Days per week: Not on file    Minutes per session: Not on file  . Stress: Not on file  Relationships  . Social connections:    Talks on phone: Not on file    Gets together: Not on file    Attends religious service: Not on file    Active member of club or organization: Not on file    Attends meetings of clubs or organizations: Not on file    Relationship status: Not on file  . Intimate partner violence:    Fear of current or ex partner: Not on file    Emotionally  abused: Not on file    Physically abused: Not on file    Forced sexual activity: Not on file  Other Topics Concern  . Not on file  Social History Narrative  . Not on file    Family History:    Family History  Problem Relation Age of Onset  . Other Father 67       deceased old age  . Coronary artery disease Mother 84       deceased  . Hypertension Mother   . Other Sister 65       living and healthy  . Coronary artery disease Brother   . Skin cancer Brother   . Cancer Other        family hx of  . Diabetes Other        family hx of  . Coronary artery disease Other        family hx of male < 14  . Arthritis Other        family hx of  . Asthma Other        family hx of     ROS:  Please see the history of present illness.   All other ROS reviewed and negative.     Physical Exam/Data:   Vitals:   06/27/18 2107 06/28/18 0334 06/28/18 0735 06/28/18 1105  BP: (!) 150/61 (!) 179/63  (!) 179/73  Pulse: 74 74  73  Resp:  (!) 22  (!) 22  Temp: 98.9 F (37.2 C) 97.6 F (36.4 C)  98.4 F (36.9 C)  TempSrc: Oral   Oral  SpO2: 94% 96% 93% 95%  Weight:  95.3 kg    Height:        Intake/Output Summary (Last 24 hours) at 06/28/2018 1245 Last data filed at 06/28/2018 0500 Gross per 24 hour  Intake 1323.05 ml  Output 200 ml  Net 1123.05 ml   Last 3 Weights 06/28/2018 06/27/2018 06/25/2018  Weight (lbs) 210 lb 212 lb 8.4 oz 213 lb 13.5 oz  Weight (kg) 95.255 kg 96.4 kg 97 kg     Body mass index is  31.93 kg/m.  General:  Well nourished, well developed, in no acute distress Lymph: no adenopathy Neck: no JVD Vascular: No carotid bruits  Cardiac:  normal S1, S2; RRR; no murmur - distant heart sounds Lungs:  clear to auscultation bilaterally in upper lobes, diminished in bases L > R Abd: soft, nontender, no hepatomegaly  Ext: no edema Musculoskeletal:  No deformities, BUE and BLE strength normal and equal Skin: warm and dry  Neuro:  CNs 2-12 intact, no focal abnormalities noted Psych:  Normal affect   EKG:  The EKG was personally reviewed and demonstrates:  ST changes while intubated Telemetry:  Telemetry was personally reviewed and demonstrates:  N/A  Relevant CV Studies:  Echo 06/22/18  1. The left ventricle has moderately reduced systolic function of 86-76%. The cavity size is normal. There is severe left ventricular wall thickness. Echo evidence of pseudonormal diastolic filling patterns.  2. Normal left atrial size.  3. Normal right atrial size.  4. The mitral valve normal in structure. Regurgitation is mild by color flow Doppler.  5. Normal tricuspid valve.  6. Tricuspid regurgitation is mild.  7. The aortic valve tricuspid. There is mild thickening of the aortic valve.  8. No atrial level shunt detected by color flow Doppler.   Myoview 12/07/17:  Nuclear stress EF: 55%.  There was no ST segment deviation noted during stress.  The study is normal.  This is a low risk study.  The left ventricular ejection fraction is normal (55-65%).   Normal pharmacologic nuclear stress test with no evidence for prior infarct or ischemia.    Laboratory Data:  Chemistry Recent Labs  Lab 06/27/18 0438 06/27/18 1808 06/28/18 0349  NA 148* 147* 145  K 3.4* 4.0 3.8  CL 112* 110 109  CO2 30 30 25   GLUCOSE 96 111* 95  BUN 31* 32* 28*  CREATININE 1.24 1.20 1.21  CALCIUM 9.1 9.3 8.8*  GFRNONAA 58* >60 59*  GFRAA >60 >60 >60  ANIONGAP 6 7 11     Recent Labs  Lab  06/27/18 0438  ALBUMIN 3.0*   Hematology Recent Labs  Lab 06/25/18 1115 06/26/18 0441 06/27/18 0438  WBC 9.8 9.8 7.6  RBC 4.98 4.73 4.66  HGB 15.1 14.2 13.9  HCT 45.7 43.6 43.9  MCV 91.8 92.2 94.2  MCH 30.3 30.0 29.8  MCHC 33.0 32.6 31.7  RDW 12.9 12.8 13.0  PLT 227 211 224   Cardiac Enzymes Recent Labs  Lab 06/23/18 0626  TROPONINI 1.01*   No results for input(s): TROPIPOC in the last 168 hours.  BNPNo results for input(s): BNP, PROBNP in the last 168 hours.  DDimer No results for input(s): DDIMER in the last 168 hours.  Radiology/Studies:  Ct Angio Head W Or Wo Contrast  Result Date: 06/27/2018 CLINICAL DATA:  73 y/o  M; EXAM: CT ANGIOGRAPHY HEAD AND NECK TECHNIQUE: Multidetector CT imaging of the head and neck was performed using the standard protocol during bolus administration of intravenous contrast. Multiplanar CT image reconstructions and MIPs were obtained to evaluate the vascular anatomy. Carotid stenosis measurements (when applicable) are obtained utilizing NASCET criteria, using the distal internal carotid diameter as the denominator. CONTRAST:  38mL ISOVUE-370 IOPAMIDOL (ISOVUE-370) INJECTION 76% COMPARISON:  06/21/2018 CT head. 10/26/2017 MRI and MRA head. FINDINGS: CT HEAD FINDINGS Brain: Small focus of recent stroke in the left occipital lobe is similar in distribution of prior MRI given differences in technique. No evidence of new acute infarction, hemorrhage, hydrocephalus, extra-axial collection or mass lesion/mass effect. Chronic right posterior MCA distribution infarction. Stable chronic microvascular ischemic changes and volume loss of the brain. Vascular: Calcific atherosclerosis of carotid siphons and vertebral arteries. Skull: Normal. Negative for fracture or focal lesion. Sinuses: Normal aeration of paranasal sinuses. Partial opacification of the right mastoid air cells. Normal aeration of the left mastoid air cells. Orbits: No acute finding. Review of the MIP  images confirms the above findings CTA NECK FINDINGS Aortic arch: Bovine variant branching. Imaged portion shows no evidence of aneurysm or dissection. No significant stenosis of the major arch vessel origins. Calcific aortic atherosclerosis. Right carotid system: No evidence of dissection, stenosis (50% or greater) or occlusion. Left carotid system: No evidence of dissection, stenosis (50% or greater) or occlusion. Non stenotic calcified plaque of the carotid bifurcation. Vertebral arteries: Right dominant. Segments of predominantly fibrofatty plaque with up to moderate 50% stenosis of the V3 segment. Patent diminutive left vertebral artery. Skeleton: Moderate spondylosis of the cervical spine predominant discogenic degenerative changes. No high-grade bony spinal canal stenosis. No acute osseous abnormality is evident. Other neck: Negative. Upper chest: Negative. Review of the MIP images confirms the above findings CTA HEAD FINDINGS Anterior circulation: Calcific atherosclerosis of carotid siphons with mild right and moderate left paraclinoid ICA stenosis. Right M2 inferior division origin moderate stenosis. Severe proximal right A1 stenosis. Tandem segments of moderate to severe stenosis of left  A2. mild right A2 stenosis. No large vessel occlusion, aneurysm, or vascular malformation. Posterior circulation: Multiple segments of mild-to-moderate stenosis in the bilateral P2 segments. No large vessel occlusion, aneurysm, or vascular malformation. Venous sinuses: As permitted by contrast timing, patent. Anatomic variants: None significant. Delayed phase: No abnormal intracranial enhancement. Review of the MIP images confirms the above findings IMPRESSION: CT head: 1. Small focus of recent stroke in the left occipital lobe is similar in distribution to prior MRI given differences in technique. 2. No new acute intracranial abnormality identified. 3. Chronic right posterior MCA distribution infarct. Stable chronic  microvascular ischemic changes and volume loss of the brain. CTA neck: 1. Moderate 50% stenosis of the V3 segment of the right vertebral artery. 2. Otherwise no hemodynamically significant stenosis or occlusion of the carotid and vertebral arteries of the neck. 3. Moderate spondylosis of the cervical spine predominant discogenic degenerative changes. CTA head: 1. No large vessel occlusion, aneurysm, or vascular malformation. 2. Stable advanced intracranial atherosclerosis with multiple segments of stenosis in the anterior and posterior circulation. Electronically Signed   By: Kristine Garbe M.D.   On: 06/27/2018 16:14   Ct Angio Neck W Or Wo Contrast  Result Date: 06/27/2018 CLINICAL DATA:  73 y/o  M; EXAM: CT ANGIOGRAPHY HEAD AND NECK TECHNIQUE: Multidetector CT imaging of the head and neck was performed using the standard protocol during bolus administration of intravenous contrast. Multiplanar CT image reconstructions and MIPs were obtained to evaluate the vascular anatomy. Carotid stenosis measurements (when applicable) are obtained utilizing NASCET criteria, using the distal internal carotid diameter as the denominator. CONTRAST:  3mL ISOVUE-370 IOPAMIDOL (ISOVUE-370) INJECTION 76% COMPARISON:  06/21/2018 CT head. 10/26/2017 MRI and MRA head. FINDINGS: CT HEAD FINDINGS Brain: Small focus of recent stroke in the left occipital lobe is similar in distribution of prior MRI given differences in technique. No evidence of new acute infarction, hemorrhage, hydrocephalus, extra-axial collection or mass lesion/mass effect. Chronic right posterior MCA distribution infarction. Stable chronic microvascular ischemic changes and volume loss of the brain. Vascular: Calcific atherosclerosis of carotid siphons and vertebral arteries. Skull: Normal. Negative for fracture or focal lesion. Sinuses: Normal aeration of paranasal sinuses. Partial opacification of the right mastoid air cells. Normal aeration of the left  mastoid air cells. Orbits: No acute finding. Review of the MIP images confirms the above findings CTA NECK FINDINGS Aortic arch: Bovine variant branching. Imaged portion shows no evidence of aneurysm or dissection. No significant stenosis of the major arch vessel origins. Calcific aortic atherosclerosis. Right carotid system: No evidence of dissection, stenosis (50% or greater) or occlusion. Left carotid system: No evidence of dissection, stenosis (50% or greater) or occlusion. Non stenotic calcified plaque of the carotid bifurcation. Vertebral arteries: Right dominant. Segments of predominantly fibrofatty plaque with up to moderate 50% stenosis of the V3 segment. Patent diminutive left vertebral artery. Skeleton: Moderate spondylosis of the cervical spine predominant discogenic degenerative changes. No high-grade bony spinal canal stenosis. No acute osseous abnormality is evident. Other neck: Negative. Upper chest: Negative. Review of the MIP images confirms the above findings CTA HEAD FINDINGS Anterior circulation: Calcific atherosclerosis of carotid siphons with mild right and moderate left paraclinoid ICA stenosis. Right M2 inferior division origin moderate stenosis. Severe proximal right A1 stenosis. Tandem segments of moderate to severe stenosis of left A2. mild right A2 stenosis. No large vessel occlusion, aneurysm, or vascular malformation. Posterior circulation: Multiple segments of mild-to-moderate stenosis in the bilateral P2 segments. No large vessel occlusion, aneurysm, or vascular malformation.  Venous sinuses: As permitted by contrast timing, patent. Anatomic variants: None significant. Delayed phase: No abnormal intracranial enhancement. Review of the MIP images confirms the above findings IMPRESSION: CT head: 1. Small focus of recent stroke in the left occipital lobe is similar in distribution to prior MRI given differences in technique. 2. No new acute intracranial abnormality identified. 3. Chronic  right posterior MCA distribution infarct. Stable chronic microvascular ischemic changes and volume loss of the brain. CTA neck: 1. Moderate 50% stenosis of the V3 segment of the right vertebral artery. 2. Otherwise no hemodynamically significant stenosis or occlusion of the carotid and vertebral arteries of the neck. 3. Moderate spondylosis of the cervical spine predominant discogenic degenerative changes. CTA head: 1. No large vessel occlusion, aneurysm, or vascular malformation. 2. Stable advanced intracranial atherosclerosis with multiple segments of stenosis in the anterior and posterior circulation. Electronically Signed   By: Kristine Garbe M.D.   On: 06/27/2018 16:14   Dg Chest Port 1 View  Result Date: 06/25/2018 CLINICAL DATA:  Encounter for feeding tube placement EXAM: PORTABLE CHEST 1 VIEW COMPARISON:  06/22/2018 FINDINGS: Interval tracheal and esophageal extubation. Low volume chest with haziness at the bases that is likely from atelectasis. No effusion or pneumothorax. Normal heart size. Severe glenohumeral osteoarthritis on both sides. IMPRESSION: Low volume chest without acute finding. Electronically Signed   By: Monte Fantasia M.D.   On: 06/25/2018 07:54   Dg Swallowing Func-speech Pathology  Result Date: 06/25/2018 Objective Swallowing Evaluation: Type of Study: MBS-Modified Barium Swallow Study  Patient Details Name: ISSAAC SHIPPER MRN: [id number] Date of Birth: 14-May-1946 Today's Date: 06/25/2018 Time: SLP Start Time (ACUTE ONLY): 6440 -SLP Stop Time (ACUTE ONLY): 1343 SLP Time Calculation (min) (ACUTE ONLY): 20 min Past Medical History: Past Medical History: Diagnosis Date . Arthritis  . Carotid stenosis, asymptomatic, right  . Carpal tunnel syndrome   bilateral . Coronary artery disease  . GERD (gastroesophageal reflux disease)  . History of kidney stones  . Hx of colonic polyp  . Hypercholesterolemia  . Hypertension  . Myocardial infarction ([place])   hx of . Obesity  Past Surgical  History: Past Surgical History: Procedure Laterality Date . CARDIAC CATHETERIZATION   . CORONARY STENT PLACEMENT  2000  x3  . ENDARTERECTOMY Right 12/25/2017  Procedure: ENDARTERECTOMY CAROTID RIGHT;  Surgeon: Rosetta Posner, MD;  Location: [facility];  Service: Vascular;  Laterality: Right; . PATCH ANGIOPLASTY Right 12/25/2017  Procedure: PATCH ANGIOPLASTY RIGHT CAROTID ARTERY;  Surgeon: Rosetta Posner, MD;  Location: [facility] OR;  Service: Vascular;  Laterality: Right; HPI: 73 year old male presenting with seizure-like episode, acute loss of consciousness followed by severe combativeness. Intubated 1/29-2/2. MRI of the brain shows subcentimeter left occipital areas of restricted diffusion. PMH including HTN, coronary artery disease, hypercholesterolemia, gastroesophageal reflux disease, and carotid artery stenosis. CXR low volume chest without acute finding.  No data recorded Assessment / Plan / Recommendation CHL IP CLINICAL IMPRESSIONS 06/25/2018 Clinical Impression Pt exhibited mild-moderate oropharyngeal dysphagia with aspiration and penetration of nectar and honey thick boluses. Oral phase marked by mildly decreased cohesion and coordination of transit with thinner boluses. Pt's ability to elevate larynx and deflect epiglottis to protect airway is adequate however timing to initiate protective mechanisms was suboptimal. Nectar barium was silently aspirated during the swallow and honey thick substantially penetrated despite chin tuck head position, modification of bolus size or presentation. Recommend pt initiate Dys 1 (puree) consistency and nothing thinner than puree (liquids thickened to pudding consistency), alert  for po's, upright, small bites and throat clear intermittently.      SLP Visit Diagnosis Dysphagia, pharyngeal phase (R13.13) Attention and concentration deficit following -- Frontal lobe and executive function deficit following -- Impact on safety and function Moderate aspiration risk;Severe aspiration risk   CHL  IP TREATMENT RECOMMENDATION 06/25/2018 Treatment Recommendations Therapy as outlined in treatment plan below   Prognosis 06/25/2018 Prognosis for Safe Diet Advancement Good Barriers to Reach Goals -- Barriers/Prognosis Comment -- CHL IP DIET RECOMMENDATION 06/25/2018 SLP Diet Recommendations Pudding thick liquid;Dysphagia 1 (Puree) solids Liquid Administration via -- Medication Administration Crushed with puree Compensations Slow rate;Small sips/bites;Clear throat intermittently Postural Changes Seated upright at 90 degrees   CHL IP OTHER RECOMMENDATIONS 06/25/2018 Recommended Consults -- Oral Care Recommendations Oral care BID Other Recommendations Order thickener from pharmacy   CHL IP FOLLOW UP RECOMMENDATIONS 06/25/2018 Follow up Recommendations Home health SLP   CHL IP FREQUENCY AND DURATION 06/25/2018 Speech Therapy Frequency (ACUTE ONLY) min 2x/week Treatment Duration 2 weeks      CHL IP ORAL PHASE 06/25/2018 Oral Phase Impaired Oral - Pudding Teaspoon -- Oral - Pudding Cup -- Oral - Honey Teaspoon Decreased bolus cohesion Oral - Honey Cup Decreased bolus cohesion Oral - Nectar Teaspoon -- Oral - Nectar Cup Decreased bolus cohesion Oral - Nectar Straw -- Oral - Thin Teaspoon -- Oral - Thin Cup -- Oral - Thin Straw -- Oral - Puree WFL Oral - Mech Soft -- Oral - Regular -- Oral - Multi-Consistency -- Oral - Pill -- Oral Phase - Comment --  CHL IP PHARYNGEAL PHASE 06/25/2018 Pharyngeal Phase Impaired Pharyngeal- Pudding Teaspoon -- Pharyngeal -- Pharyngeal- Pudding Cup -- Pharyngeal -- Pharyngeal- Honey Teaspoon Penetration/Aspiration during swallow Pharyngeal Material enters airway, remains ABOVE vocal cords and not ejected out Pharyngeal- Honey Cup Penetration/Aspiration during swallow Pharyngeal Material enters airway, remains ABOVE vocal cords and not ejected out Pharyngeal- Nectar Teaspoon -- Pharyngeal -- Pharyngeal- Nectar Cup Penetration/Aspiration during swallow;Other (Comment) Pharyngeal Material enters airway, passes  BELOW cords without attempt by patient to eject out (silent aspiration) Pharyngeal- Nectar Straw -- Pharyngeal -- Pharyngeal- Thin Teaspoon -- Pharyngeal -- Pharyngeal- Thin Cup -- Pharyngeal -- Pharyngeal- Thin Straw -- Pharyngeal -- Pharyngeal- Puree WFL Pharyngeal -- Pharyngeal- Mechanical Soft -- Pharyngeal -- Pharyngeal- Regular -- Pharyngeal -- Pharyngeal- Multi-consistency -- Pharyngeal -- Pharyngeal- Pill -- Pharyngeal -- Pharyngeal Comment --  CHL IP CERVICAL ESOPHAGEAL PHASE 06/25/2018 Cervical Esophageal Phase WFL Pudding Teaspoon -- Pudding Cup -- Honey Teaspoon -- Honey Cup -- Nectar Teaspoon -- Nectar Cup -- Nectar Straw -- Thin Teaspoon -- Thin Cup -- Thin Straw -- Puree -- Mechanical Soft -- Regular -- Multi-consistency -- Pill -- Cervical Esophageal Comment -- [place] Siren 06/25/2018, 2:56 PM Orbie Pyo Litaker M.Ed Actor Pager [phone] Office [phone]               Assessment and Plan:   1. New onset systolic heart failure 2. Elevated troponin, nonspecific EKG changes while intubated - history of CAD - EF 35-40%, previously normal in 2019 -  Troponin found to be elevated on 06/20/18 following seizure and stroke: 2.56 --> 2.23 --> 1.82 --> 1.37 --> 1.01.  - EKG with tachycardia and ST abnormalities following seizure and and while intubated - pt states he has not had anginal symptoms or symptoms of volume overload - suspect this cardiomyopathy and elevated troponin may be in response to his seizure and stroke with subsequent intubation - will treat medically for now - if  he in fact had an event, was likely prior to his seizure 06/20/18, troponin now down-trended - will discuss with attending and interventional - agree with current medication regimen of BB, bidil and losartan - he is not a good cath candidate in the setting of a stroke - recommend repeat echo in 6 weeks - if EF still reduced, may be a candidate for repeat heart catheterization or  noninvasive ischemia evaluation, continue ASA and plavix per neurology - bradycardia mentioned in previous note, would recommend placing on telemetry - he is euvolemic on exam, will defer diuretic for now   3. HTN - unclear if he is a candidate for permissive hypertension and BP goals - consider titrating losartan for better pressure control - will defer to neurology   4. CKD stage III - on bidil and losartan - per primary, follow BMP outpatient   5. HLD - continue crestor - 06/23/2018: Cholesterol 195; HDL 40; LDL Cholesterol 113; Triglycerides 295; VLDL 42   Will need close cardiology follow up outpatient. We will continue to follow.     For questions or updates, please contact [place] Please consult [email or web address] for contact info under     Signed, Ledora Bottcher, PA  06/28/2018 12:45 PM  ---------------------------------------------------------------------------------------------   History and all data above reviewed.  Patient examined.  I agree with the findings as above.  JAXTON CASALE feels well and has no chest pain or heart failure symptoms.   Constitutional: No acute distress Cardiovascular: regular rhythm, normal rate, no murmurs. S1 and S2 normal. Radial pulses normal bilaterally. No jugular venous distention.  Respiratory: clear to auscultation bilaterally GI : normal bowel sounds, soft and nontender. No distention.   MSK: extremities warm, well perfused. No edema.  PSYCH: alert and oriented x 3, normal mood and affect.   All available labs, radiology testing, previous records reviewed. Agree with documented assessment and plan of my colleague as stated above with the following additions or changes:  Principal Problem:   Seizure ([place]) Active Problems:   HYPERCHOLESTEROLEMIA, PURE   Essential hypertension   Acute encephalopathy   Acute respiratory failure with hypoxia (HCC)   Hypernatremia   CKD (chronic kidney disease), stage III (HCC)    Cerebral thrombosis with cerebral infarction    Plan: On presentation the patient had an elevated troponin at an out of abnormal ECG, however these could be correlated with his stroke and seizure.  He was asymptomatic with exertion prior to this episode, and is currently asymptomatic as well.  I have participated in shared decision making with the patient and we have determined that we will defer urgent ischemia evaluation in the setting of recent stroke.  His ejection fraction is low, and was normal in July 2019 when he saw his cardiologist.  This is certainly concerning for ischemic disease, however there is also the possibility that this is a stress/medical illness cardiomyopathy and will recover.  It would be reasonable to continue adequate heart failure therapy and reassess the patient in 1 month.  We will arrange an appointment with the patient's cardiologist Dr. Geraldo Pitter, and will defer to his office visit as to a repeat assessment of ejection fraction versus stress assessment for ischemia.  Certainly if the patient develops chest pain, or asymptomatic on hospital discharge, an appointment sooner with an ischemia evaluation is warranted.   Length of Stay:  LOS: 8 days   Elouise Munroe, MD [facility] 5:47 PM  06/28/2018

## 2018-06-28 NOTE — Progress Notes (Signed)
Physical Therapy Treatment Patient Details Name: Jeremy [name] MRN: [id number] DOB: December 18, 1945 Today's Date: 06/28/2018    History of Present Illness Pt is a 73 y.o. male admitted 06/20/18 with seizure-like episode, acute loss of consciousness followed by severe combativeness. MRI showed L occipital areas of restricted diffusion. ETT 1/29-2/2. PMH includes HTN, CAD< GERD, carotid artery stenosis.   PT Comments    Pt progressing with mobility, although remains limited by weakness, decreased safety awareness, poor insight and difficulty problem solving. Pt able to perform gait training without DME, requiring intermittent min-modA to prevent multiple bouts of LOB; demonstrates poor postural strategies/postural reactions. Pt frequently requesting ice water despite educ on current thickened liquids diet recs. Visual fields tested well.   Follow Up Recommendations  SNF;CIR;Supervision/Assistance - 24 hour(insurance declined CIR)     Equipment Recommendations  (TBD next venue)    Recommendations for Other Services       Precautions / Restrictions Precautions Precautions: Fall Precaution Comments: Impulsive Restrictions Weight Bearing Restrictions: No    Mobility  Bed Mobility Overal bed mobility: Modified Independent                Transfers Overall transfer level: Needs assistance Equipment used: 1 person hand held assist Transfers: Sit to/from Stand Sit to Stand: Min assist         General transfer comment: Pt attempted standing 2x, falling backwards onto bed before achieving fully upright; reliant on HHA and minA to fully stand and prevent LOB  Ambulation/Gait Ambulation/Gait assistance: Min assist;Mod assist Gait Distance (Feet): 120 Feet Assistive device: 1 person hand held assist Gait Pattern/deviations: Step-through pattern;Decreased stride length;Staggering left;Staggering right;Scissoring Gait velocity: Decreased   General Gait Details: Slow, unsteady gait  with frequent scissoring causing pt to stagger R/L, requiring frequent min-modA to prevent lateral LOB. Pt reports he does not note visual deficits; initially running PT guarding on R-side into R-wall, but able to correct with cues. Intermittently reaching to touch items on L-side. Frequent cues for safety   Stairs             Wheelchair Mobility    Modified Rankin (Stroke Patients Only) Modified Rankin (Stroke Patients Only) Pre-Morbid Rankin Score: No symptoms Modified Rankin: Moderately severe disability     Balance Overall balance assessment: Needs assistance Sitting-balance support: No upper extremity supported;Feet supported Sitting balance-Leahy Scale: Fair Sitting balance - Comments: Able to maintain static sitting balance   Standing balance support: During functional activity;Bilateral upper extremity supported Standing balance-Leahy Scale: Poor Standing balance comment: Reliant on intermittent UE support and external assist                            Cognition Arousal/Alertness: Awake/alert Behavior During Therapy: WFL for tasks assessed/performed Overall Cognitive Status: Impaired/Different from baseline Area of Impairment: Attention;Following commands;Safety/judgement;Awareness;Problem solving                   Current Attention Level: Selective   Following Commands: Follows one step commands inconsistently;Follows one step commands with increased time Safety/Judgement: Decreased awareness of safety;Decreased awareness of deficits Awareness: Emergent Problem Solving: Slow processing;Requires verbal cues        Exercises      General Comments        Pertinent Vitals/Pain Pain Assessment: Faces Faces Pain Scale: Hurts a little bit Pain Location: throat Pain Descriptors / Indicators: Discomfort Pain Intervention(s): Monitored during session;Patient requesting pain meds-RN notified    Home Living  Prior Function            PT Goals (current goals can now be found in the care plan section) Acute Rehab PT Goals Patient Stated Goal: Drink some ice water PT Goal Formulation: With patient Time For Goal Achievement: 07/08/18 Potential to Achieve Goals: Fair Progress towards PT goals: Progressing toward goals    Frequency    Min 3X/week      PT Plan Current plan remains appropriate    Co-evaluation              AM-PAC PT "6 Clicks" Mobility   Outcome Measure  Help needed turning from your back to your side while in a flat bed without using bedrails?: A Little Help needed moving from lying on your back to sitting on the side of a flat bed without using bedrails?: A Little Help needed moving to and from a bed to a chair (including a wheelchair)?: A Little Help needed standing up from a chair using your arms (e.g., wheelchair or bedside chair)?: A Little Help needed to walk in hospital room?: A Lot Help needed climbing 3-5 steps with a railing? : A Lot 6 Click Score: 16    End of Session Equipment Utilized During Treatment: Gait belt Activity Tolerance: Patient tolerated treatment well Patient left: in bed;with call bell/phone within reach;with bed alarm set Nurse Communication: Mobility status PT Visit Diagnosis: Muscle weakness (generalized) (M62.81);Other abnormalities of gait and mobility (R26.89)     Time: 0938-1829 PT Time Calculation (min) (ACUTE ONLY): 16 min  Charges:  $Gait Training: 8-22 mins                    Mabeline Caras, PT, DPT Acute Rehabilitation Services  Pager [phone] Office [place] 06/28/2018, 1:57 PM

## 2018-06-28 NOTE — Progress Notes (Signed)
  Speech Language Pathology Treatment: Dysphagia  Patient Details Name: Jeremy [name] MRN: [id number] DOB: [birth year] Today's Date: 06/28/2018 Time: 6286-3817 SLP Time Calculation (min) (ACUTE ONLY): 15 min  Assessment / Plan / Recommendation Clinical Impression  Pt seen for dysphagia therapy; reviewed need for thickener (fiance not present) without significant complaints about puree texture or pudding thick liquids. Plan to repeat MBS tomorrow to determine improvements in swallow function and ability to advance diet. Consumed pudding thick lemonade without s/s aspiration, however silently aspirated during MBS. Verbal cues to swallow hard with intermittent throat clears.    HPI HPI: 73 year old male presenting with seizure-like episode, acute loss of consciousness followed by severe combativeness. Intubated 1/29-2/2. MRI of the brain shows subcentimeter left occipital areas of restricted diffusion. PMH including HTN, coronary artery disease, hypercholesterolemia, gastroesophageal reflux disease, and carotid artery stenosis. CXR low volume chest without acute finding.      SLP Plan  Continue with current plan of care;MBS       Recommendations  Diet recommendations: Dysphagia 1 (puree);Pudding-thick liquid Liquids provided via: Teaspoon Medication Administration: Crushed with puree Supervision: Patient able to self feed;Full supervision/cueing for compensatory strategies Compensations: Slow rate;Small sips/bites;Clear throat intermittently Postural Changes and/or Swallow Maneuvers: Seated upright 90 degrees                Oral Care Recommendations: Oral care BID Follow up Recommendations: Skilled Nursing facility SLP Visit Diagnosis: Dysphagia, pharyngeal phase (R13.13) Plan: Continue with current plan of care;MBS                       Houston Siren 06/28/2018, 2:13 PM   Orbie Pyo Colvin Caroli.Ed Risk analyst [phone] Office  [phone]

## 2018-06-28 NOTE — Social Work (Signed)
Patient has been approved by [facility] [employer] for SNF. Auth [id number] (valid for 7 days from today). Reviewed bed offers with patient and significant other, [name]. They have chosen [facility].  CSW will follow for patient's medical readiness and support with discharge planning.  Estanislado Emms, LCSW [phone]

## 2018-06-28 NOTE — Progress Notes (Signed)
PROGRESS NOTE    Jeremy [name]  YFV:[id number] DOB: September 10, 1945 DOA: 06/20/2018 PCP: Celene Squibb, MD   Brief Narrative: Jeremy [name] is a 73 y.o. male with a history of CVA, hypertension. Patient presented secondary to a seizure-like episode. He required intubation for airway protection and started on AEDs. Extubated on 06/24/18. Acute stroke noticed on MRI from 06/20/18. Now undergoing stroke workup. Transthoracic Echocardiogram performed.    Assessment & Plan:   Principal Problem:   Seizure ([place]) Active Problems:   HYPERCHOLESTEROLEMIA, PURE   Essential hypertension   Acute encephalopathy   Acute respiratory failure with hypoxia (HCC)   Hypernatremia   CKD (chronic kidney disease), stage III (HCC)   Seizure Clinical suspicion based on history. EEG was not significant for seizure at the time of study. Thought secondary to known history of stroke. -Vimpat 100 BID, Keppra 500 mg BID, phenytoin 125 mg TID  Acute encephalopathy Likely metabolic from seizure but unknown.  Acute respiratory failure with hypoxia Patient required mechanical ventilation from 1/29 to 2/2. Now on 2L via nasal canula. -Wean to room air  Acute/subacute occipital lobe CVA Per neuro, thought to be embolic. Transthoracic Echocardiogram significant for no thrombus. Discussed with neurology today. Plan for full stroke workup. -Continue aspirin and increased Crestor 20 mg -Neurology recommendations: CTA head/neck, increase statin therapy. Transesophageal Echocardiogram planned. No loops secondary to patient's low EF. Will need medical management by cardiology and future loop vs ICD depending on improvement of EF. Further recommendations pending.  Essential hypertension Not controlled -Continue Coreg and Imdur -Add losartan 25 mg daily  Hyperlipidemia History of CAD -Continue Crestor and aspirin  Chronic systolic heart failure EF of 35-40%. Stable. Currently not on an ACEi -Continue Coreg,  Bidil -Start losartan 25 mg daily  CKD stage III Stable  History of stroke -Crestor, aspirin as mentioned above  Sleeplessness Has this issue as an outpatient at times. Takes medication but is unsure of what he takes. -Trazodone prn  Hypernatremia Secondary to poor oral fluid intake. Improved with IV fluids -Encourage oral fluid intake  Hypokalemia -potassium supplementation  Oral candidiasis -Nystatin   DVT prophylaxis: Heparin subq Code Status:   Code Status: Full Code Family Communication: Girlfriend at bedside Disposition Plan: Discharge to SNF when stroke workup complete. (insurance declined CIR)   Consultants:   Neurology  Cardiology  Procedures:   Transthoracic Echocardiogram (06/22/2018) IMPRESSIONS    1. The left ventricle has moderately reduced systolic function of 16-38%. The cavity size is normal. There is severe left ventricular wall thickness. Echo evidence of pseudonormal diastolic filling patterns.  2. Normal left atrial size.  3. Normal right atrial size.  4. The mitral valve normal in structure. Regurgitation is mild by color flow Doppler.  5. Normal tricuspid valve.  6. Tricuspid regurgitation is mild.  7. The aortic valve tricuspid. There is mild thickening of the aortic valve.  8. No atrial level shunt detected by color flow Doppler.  Antimicrobials:  Ceftriaxone (1/29)    Subjective: Some throat pain that started recently. Afebrile. No cough.   Objective: Vitals:   06/27/18 2107 06/28/18 0334 06/28/18 0735 06/28/18 1105  BP: (!) 150/61 (!) 179/63  (!) 179/73  Pulse: 74 74  73  Resp:  (!) 22  (!) 22  Temp: 98.9 F (37.2 C) 97.6 F (36.4 C)  98.4 F (36.9 C)  TempSrc: Oral   Oral  SpO2: 94% 96% 93% 95%  Weight:  95.3 kg    Height:  Intake/Output Summary (Last 24 hours) at 06/28/2018 1228 Last data filed at 06/28/2018 0500 Gross per 24 hour  Intake 1323.05 ml  Output 200 ml  Net 1123.05 ml   Filed Weights    06/25/18 0500 06/27/18 0500 06/28/18 0334  Weight: 97 kg 96.4 kg 95.3 kg    Examination:  General exam: Appears calm and comfortable Mouth: Tongue with yellowish plaque, no buccal lesions noted Respiratory system: Clear to auscultation. Respiratory effort normal. Cardiovascular system: S1 & S2 heard, RRR. No murmurs, rubs, gallops or clicks. Gastrointestinal system: Abdomen is nondistended, soft and nontender. No organomegaly or masses felt. Normal bowel sounds heard. Central nervous system: Alert and oriented. No focal neurological deficits. Extremities: No edema. No calf tenderness Skin: No cyanosis. No rashes Psychiatry: Judgement and insight appear normal. Mood & affect appropriate.    Data Reviewed: I have personally reviewed following labs and imaging studies  CBC: Recent Labs  Lab 06/23/18 0905 06/23/18 1230 06/24/18 2035 06/25/18 1115 06/26/18 0441 06/27/18 0438  WBC 9.5  --   --  9.8 9.8 7.6  HGB 13.1 12.6* 13.6 15.1 14.2 13.9  HCT 40.7 37.0* 40.0 45.7 43.6 43.9  MCV 95.8  --   --  91.8 92.2 94.2  PLT 168  --   --  227 211 976   Basic Metabolic Panel: Recent Labs  Lab 06/22/18 1533  06/23/18 0626  06/23/18 1623 06/24/18 0519 06/24/18 1617  06/25/18 1115 06/26/18 0441 06/27/18 0438 06/27/18 1808 06/28/18 0349  NA  --   --  144   < >  --   --   --    < > 146* 146* 148* 147* 145  K  --   --  5.1   < >  --   --   --    < > 3.2* 3.1* 3.4* 4.0 3.8  CL  --   --  118*  --   --   --   --   --  105 108 112* 110 109  CO2  --   --  17*  --   --   --   --   --  27 26 30 30 25   GLUCOSE  --   --  101*  --   --   --   --   --  103* 125* 96 111* 95  BUN  --   --  27*  --   --   --   --   --  32* 42* 31* 32* 28*  CREATININE  --   --  1.26*  --   --   --   --   --  1.14 1.26* 1.24 1.20 1.21  CALCIUM  --   --  8.1*  --   --   --   --   --  9.2 9.0 9.1 9.3 8.8*  MG  --    < > 2.2  --  2.1 2.1 1.9  --  2.1 2.3  --   --   --   PHOS 2.2*  --  4.1  --  3.2  --   --   --  2.7  2.2*  --   --   --    < > = values in this interval not displayed.   GFR: Estimated Creatinine Clearance: 61.8 mL/min (by C-G formula based on SCr of 1.21 mg/dL). Liver Function Tests: Recent Labs  Lab 06/27/18 0438  ALBUMIN 3.0*   No results for input(s):  LIPASE, AMYLASE in the last 168 hours. Recent Labs  Lab 06/27/18 0438  AMMONIA 18   Coagulation Profile: No results for input(s): INR, PROTIME in the last 168 hours. Cardiac Enzymes: Recent Labs  Lab 06/23/18 0626  TROPONINI 1.01*   BNP (last 3 results) No results for input(s): PROBNP in the last 8760 hours. HbA1C: No results for input(s): HGBA1C in the last 72 hours. CBG: Recent Labs  Lab 06/24/18 2322 06/25/18 0319 06/25/18 0808 06/25/18 1219 06/25/18 1559  GLUCAP 100* 97 101* 101* 111*   Lipid Profile: No results for input(s): CHOL, HDL, LDLCALC, TRIG, CHOLHDL, LDLDIRECT in the last 72 hours. Thyroid Function Tests: No results for input(s): TSH, T4TOTAL, FREET4, T3FREE, THYROIDAB in the last 72 hours. Anemia Panel: No results for input(s): VITAMINB12, FOLATE, FERRITIN, TIBC, IRON, RETICCTPCT in the last 72 hours. Sepsis Labs: No results for input(s): PROCALCITON, LATICACIDVEN in the last 168 hours.  Recent Results (from the past 240 hour(s))  CSF culture     Status: None   Collection Time: 06/20/18  1:23 PM  Result Value Ref Range Status   Specimen Description CSF  Final   Special Requests NONE  Final   Gram Stain   Final    NO ORGANISMS SEEN WBC PRESENT, PREDOMINANTLY MONONUCLEAR CYTOSPIN SMEAR Gram Stain Report Called to,Read Back By and Verified With: MINTER R. AT 1450 ON [id number] BY THOMPSON S. Performed at [facility], [address] [place], [place] [address]    Culture NO GROWTH  Final   Report Status 06/23/2018 FINAL  Final  MRSA PCR Screening     Status: None   Collection Time: 06/20/18  4:00 PM  Result Value Ref Range Status   MRSA by PCR NEGATIVE NEGATIVE Final    Comment:         The GeneXpert MRSA Assay (FDA approved for NASAL specimens only), is one component of a comprehensive MRSA colonization surveillance program. It is not intended to diagnose MRSA infection nor to guide or monitor treatment for MRSA infections. Performed at [facility], [place] [address], [place], [place] [address]   Culture, blood (Routine X 2) w Reflex to ID Panel     Status: None   Collection Time: 06/22/18  3:05 PM  Result Value Ref Range Status   Specimen Description BLOOD RIGHT HAND  Final   Special Requests   Final    BOTTLES DRAWN AEROBIC ONLY Blood Culture adequate volume   Culture   Final    NO GROWTH 5 DAYS Performed at [facility], [place] [address], [place], [place] [address]    Report Status 06/27/2018 FINAL  Final  Culture, blood (Routine X 2) w Reflex to ID Panel     Status: None   Collection Time: 06/22/18  3:33 PM  Result Value Ref Range Status   Specimen Description BLOOD RIGHT ARM  Final   Special Requests   Final    BOTTLES DRAWN AEROBIC ONLY Blood Culture adequate volume   Culture   Final    NO GROWTH 5 DAYS Performed at [facility], [place] [address] [place],  [address]    Report Status 06/27/2018 FINAL  Final         Radiology Studies: Ct Angio Head W Or Wo Contrast  Result Date: 06/27/2018 CLINICAL DATA:  73 y/o  M; EXAM: CT ANGIOGRAPHY HEAD AND NECK TECHNIQUE: Multidetector CT imaging of the head and neck was performed using the standard protocol during bolus administration  of intravenous contrast. Multiplanar CT image reconstructions and MIPs were obtained to evaluate the vascular anatomy. Carotid stenosis measurements (when applicable) are obtained utilizing NASCET criteria, using the distal internal carotid diameter as the denominator. CONTRAST:  59mL ISOVUE-370 IOPAMIDOL (ISOVUE-370) INJECTION 76% COMPARISON:  06/21/2018 CT head. 10/26/2017 MRI and MRA head. FINDINGS: CT HEAD FINDINGS Brain: Small focus of recent stroke in the  left occipital lobe is similar in distribution of prior MRI given differences in technique. No evidence of new acute infarction, hemorrhage, hydrocephalus, extra-axial collection or mass lesion/mass effect. Chronic right posterior MCA distribution infarction. Stable chronic microvascular ischemic changes and volume loss of the brain. Vascular: Calcific atherosclerosis of carotid siphons and vertebral arteries. Skull: Normal. Negative for fracture or focal lesion. Sinuses: Normal aeration of paranasal sinuses. Partial opacification of the right mastoid air cells. Normal aeration of the left mastoid air cells. Orbits: No acute finding. Review of the MIP images confirms the above findings CTA NECK FINDINGS Aortic arch: Bovine variant branching. Imaged portion shows no evidence of aneurysm or dissection. No significant stenosis of the major arch vessel origins. Calcific aortic atherosclerosis. Right carotid system: No evidence of dissection, stenosis (50% or greater) or occlusion. Left carotid system: No evidence of dissection, stenosis (50% or greater) or occlusion. Non stenotic calcified plaque of the carotid bifurcation. Vertebral arteries: Right dominant. Segments of predominantly fibrofatty plaque with up to moderate 50% stenosis of the V3 segment. Patent diminutive left vertebral artery. Skeleton: Moderate spondylosis of the cervical spine predominant discogenic degenerative changes. No high-grade bony spinal canal stenosis. No acute osseous abnormality is evident. Other neck: Negative. Upper chest: Negative. Review of the MIP images confirms the above findings CTA HEAD FINDINGS Anterior circulation: Calcific atherosclerosis of carotid siphons with mild right and moderate left paraclinoid ICA stenosis. Right M2 inferior division origin moderate stenosis. Severe proximal right A1 stenosis. Tandem segments of moderate to severe stenosis of left A2. mild right A2 stenosis. No large vessel occlusion, aneurysm, or  vascular malformation. Posterior circulation: Multiple segments of mild-to-moderate stenosis in the bilateral P2 segments. No large vessel occlusion, aneurysm, or vascular malformation. Venous sinuses: As permitted by contrast timing, patent. Anatomic variants: None significant. Delayed phase: No abnormal intracranial enhancement. Review of the MIP images confirms the above findings IMPRESSION: CT head: 1. Small focus of recent stroke in the left occipital lobe is similar in distribution to prior MRI given differences in technique. 2. No new acute intracranial abnormality identified. 3. Chronic right posterior MCA distribution infarct. Stable chronic microvascular ischemic changes and volume loss of the brain. CTA neck: 1. Moderate 50% stenosis of the V3 segment of the right vertebral artery. 2. Otherwise no hemodynamically significant stenosis or occlusion of the carotid and vertebral arteries of the neck. 3. Moderate spondylosis of the cervical spine predominant discogenic degenerative changes. CTA head: 1. No large vessel occlusion, aneurysm, or vascular malformation. 2. Stable advanced intracranial atherosclerosis with multiple segments of stenosis in the anterior and posterior circulation. Electronically Signed   By: Kristine Garbe M.D.   On: 06/27/2018 16:14   Ct Angio Neck W Or Wo Contrast  Result Date: 06/27/2018 CLINICAL DATA:  73 y/o  M; EXAM: CT ANGIOGRAPHY HEAD AND NECK TECHNIQUE: Multidetector CT imaging of the head and neck was performed using the standard protocol during bolus administration of intravenous contrast. Multiplanar CT image reconstructions and MIPs were obtained to evaluate the vascular anatomy. Carotid stenosis measurements (when applicable) are obtained utilizing NASCET criteria, using the distal internal carotid diameter as  the denominator. CONTRAST:  72mL ISOVUE-370 IOPAMIDOL (ISOVUE-370) INJECTION 76% COMPARISON:  06/21/2018 CT head. 10/26/2017 MRI and MRA head.  FINDINGS: CT HEAD FINDINGS Brain: Small focus of recent stroke in the left occipital lobe is similar in distribution of prior MRI given differences in technique. No evidence of new acute infarction, hemorrhage, hydrocephalus, extra-axial collection or mass lesion/mass effect. Chronic right posterior MCA distribution infarction. Stable chronic microvascular ischemic changes and volume loss of the brain. Vascular: Calcific atherosclerosis of carotid siphons and vertebral arteries. Skull: Normal. Negative for fracture or focal lesion. Sinuses: Normal aeration of paranasal sinuses. Partial opacification of the right mastoid air cells. Normal aeration of the left mastoid air cells. Orbits: No acute finding. Review of the MIP images confirms the above findings CTA NECK FINDINGS Aortic arch: Bovine variant branching. Imaged portion shows no evidence of aneurysm or dissection. No significant stenosis of the major arch vessel origins. Calcific aortic atherosclerosis. Right carotid system: No evidence of dissection, stenosis (50% or greater) or occlusion. Left carotid system: No evidence of dissection, stenosis (50% or greater) or occlusion. Non stenotic calcified plaque of the carotid bifurcation. Vertebral arteries: Right dominant. Segments of predominantly fibrofatty plaque with up to moderate 50% stenosis of the V3 segment. Patent diminutive left vertebral artery. Skeleton: Moderate spondylosis of the cervical spine predominant discogenic degenerative changes. No high-grade bony spinal canal stenosis. No acute osseous abnormality is evident. Other neck: Negative. Upper chest: Negative. Review of the MIP images confirms the above findings CTA HEAD FINDINGS Anterior circulation: Calcific atherosclerosis of carotid siphons with mild right and moderate left paraclinoid ICA stenosis. Right M2 inferior division origin moderate stenosis. Severe proximal right A1 stenosis. Tandem segments of moderate to severe stenosis of left  A2. mild right A2 stenosis. No large vessel occlusion, aneurysm, or vascular malformation. Posterior circulation: Multiple segments of mild-to-moderate stenosis in the bilateral P2 segments. No large vessel occlusion, aneurysm, or vascular malformation. Venous sinuses: As permitted by contrast timing, patent. Anatomic variants: None significant. Delayed phase: No abnormal intracranial enhancement. Review of the MIP images confirms the above findings IMPRESSION: CT head: 1. Small focus of recent stroke in the left occipital lobe is similar in distribution to prior MRI given differences in technique. 2. No new acute intracranial abnormality identified. 3. Chronic right posterior MCA distribution infarct. Stable chronic microvascular ischemic changes and volume loss of the brain. CTA neck: 1. Moderate 50% stenosis of the V3 segment of the right vertebral artery. 2. Otherwise no hemodynamically significant stenosis or occlusion of the carotid and vertebral arteries of the neck. 3. Moderate spondylosis of the cervical spine predominant discogenic degenerative changes. CTA head: 1. No large vessel occlusion, aneurysm, or vascular malformation. 2. Stable advanced intracranial atherosclerosis with multiple segments of stenosis in the anterior and posterior circulation. Electronically Signed   By: Kristine Garbe M.D.   On: 06/27/2018 16:14        Scheduled Meds: . aspirin  81 mg Per Tube Daily  . carvedilol  12.5 mg Oral BID WC  . clopidogrel  75 mg Oral Daily  . famotidine  20 mg Oral BID  . gabapentin  300 mg Oral BID  . heparin injection (subcutaneous)  5,000 Units Subcutaneous Q8H  . ipratropium-albuterol  3 mL Nebulization BID  . isosorbide-hydrALAZINE  1 tablet Oral TID  . lacosamide  100 mg Oral BID  . levETIRAcetam  500 mg Oral BID  . multivitamin with minerals  1 tablet Per Tube Daily  . phenytoin  125 mg Oral  TID  . rosuvastatin  20 mg Per Tube q1800   Continuous Infusions:   LOS:  8 days     Cordelia Poche, MD [facility] 06/28/2018, 12:28 PM  If 7PM-7AM, please contact night-coverage [email or web address]

## 2018-06-28 NOTE — Progress Notes (Signed)
Stroke Team  PROGRESS NOTE  Subjective: .we were asked by Dr. Lonny Prude  o comment on patients need for stroke risk stratification workup.he was previously followed by the neuro hospitalist team for seizures. MRI scan the brain was obtained on 06/20/18 which shows a tiny embolic left occipital infarct.CT angiogram of the brain shows advanced intracranial atherosclerosis with multiple segments of stenosis in anterior and posterior circulation. CTA of the neck shows no significant extracranial carotid stenosis and 50% stenosis of the right vertebral artery in the V3 segment Patient has not had any focal left-sided weakness. He denies any prior history of strokes. MRI scan interestingly also shows area of encephalomalacia in the right temporal lobe which could be a previous and in stroke. There is no apparent history of atrial fibrillation. He however has multiple vascular risk factors of hypertension, hyperlipidemia, prior history of stroke, systolic heart failure, right carotid endarterectomy in August 2019 and coronary artery disease. There is no documented history of A. fib patient denies any prior known history of strokes Exam: Vitals:   06/28/18 0735 06/28/18 1105  BP:  (!) 179/73  Pulse:  73  Resp:  (!) 22  Temp:  98.4 F (36.9 C)  SpO2: 93% 95%    Physical Exam  HEENT-  Normocephalic, no lesions, without obvious abnormality.  Normal external eye and conjunctiva.  Extremities- Warm, dry and intact Musculoskeletal-no joint tenderness, deformity or swelling Skin-warm and dry, face is red and appears irritated  Neuro:  Mental Status: Alert, oriented, thought content appropriate.  Hypophonic fluent without evidence of aphasia.  Able to follow 2 step commands without difficulty. Cranial Nerves: II:  Visual fields grossly normal,  III,IV, VI: ptosis on right eye, extra-ocular motions intact bilaterally pupils equal, round, reactive to light and accommodation V,VII: smile symmetric, facial light  touch sensation normal bilaterally VIII: hearing normal bilaterally Motor: Moving all extremities antigravity Sensory: Pinprick and light touch intact throughout, bilaterally Plantars: Right: downgoing   Left: downgoing Cerebellar: normal finger-to-nose,    Medications:  Scheduled: . aspirin  81 mg Per Tube Daily  . carvedilol  12.5 mg Oral BID WC  . clopidogrel  75 mg Oral Daily  . famotidine  20 mg Oral BID  . gabapentin  300 mg Oral BID  . heparin injection (subcutaneous)  5,000 Units Subcutaneous Q8H  . ipratropium-albuterol  3 mL Nebulization BID  . isosorbide-hydrALAZINE  1 tablet Oral TID  . lacosamide  100 mg Oral BID  . levETIRAcetam  500 mg Oral BID  . losartan  25 mg Oral Daily  . magic mouthwash  5 mL Oral QID  . multivitamin with minerals  1 tablet Per Tube Daily  . phenytoin  125 mg Oral TID  . rosuvastatin  20 mg Per Tube q1800   Continuous:   Pertinent Labs/Diagnostics: No new pertinent labs      Assessment: 73 year old male with known history of right carotid stenosis and old right parietal infarct presenting with new onset seizure.   1. No further seizure-like activity.  Maintained on Keppra, Dilantin and Vimpat. He is much more awake today and following commands. 2. 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  Seizures at this point have resolved. 3. New embolic-looking punctate infarcts in the left occipital lobe.  Recommendations: - At this time would continue Keppra, Dilantin and Vimpat at current doses.  Patient has had no further seizures. - Continue aspirin and plavix x 3 weeks and then aspirin alone -  -Per  [facility] statutes, patients with seizures are not allowed to drive until  they have been seizure-free for six months. Use caution when using heavy equipment or power tools. Avoid working on ladders or at heights. Take showers instead of baths. Ensure the water temperature is not too high on the home water heater. Do  not go swimming alone. When caring for infants or small children, sit down when holding, feeding, or changing them to minimize risk of injury to the child in the event you have a seizure. Also, Maintain good sleep hygiene. Avoid alcohol. Recommend transesophageal echocardiogram to look for cardiac source of embolism. May benefit with loop recorder but if cardiology is considering defibrillator due to his low ejection fraction this may substitute instead. Maintain aggressive risk factor modification with blood pressure goal below 130/90, lipids with LDL cholesterol goal below 70 mg percent and diabetes with inflammation A1c goal below 6.5. Discussed with cardiology team and Dr. Elder Love. Greater than 50% time during this 35 minute visit was spent on counseling and coordination of care about his embolic strokes and discussion about evaluation and treatment plan and answering questions.   PramodSethi, MD 06/28/2018, 5:10 PM

## 2018-06-29 ENCOUNTER — Encounter (HOSPITAL_COMMUNITY)
Admission: EM | Disposition: A | Discharge status: Home / Self Care | Payer: Self-pay | Source: Home / Self Care | Attending: Family Medicine

## 2018-06-29 ENCOUNTER — Inpatient Hospital Stay (HOSPITAL_COMMUNITY): Payer: PPO

## 2018-06-29 ENCOUNTER — Encounter (HOSPITAL_COMMUNITY): Payer: Self-pay | Admitting: *Deleted

## 2018-06-29 DIAGNOSIS — R9431 Abnormal electrocardiogram [ECG] [EKG]: Secondary | ICD-10-CM

## 2018-06-29 HISTORY — PX: TEE WITHOUT CARDIOVERSION: SHX5443

## 2018-06-29 SURGERY — ECHOCARDIOGRAM, TRANSESOPHAGEAL
Anesthesia: Moderate Sedation

## 2018-06-29 MED ORDER — DIPHENHYDRAMINE HCL 50 MG/ML IJ SOLN
INTRAMUSCULAR | Status: AC
  Filled 2018-06-29: qty 1

## 2018-06-29 MED ORDER — FENTANYL CITRATE (PF) 100 MCG/2ML IJ SOLN
INTRAMUSCULAR | Status: AC
  Filled 2018-06-29: qty 2

## 2018-06-29 MED ORDER — BUTAMBEN-TETRACAINE-BENZOCAINE 2-2-14 % EX AERO
INHALATION_SPRAY | CUTANEOUS | Status: DC | PRN
  Administered 2018-06-29: 2 via TOPICAL

## 2018-06-29 MED ORDER — MIDAZOLAM HCL (PF) 5 MG/ML IJ SOLN
INTRAMUSCULAR | Status: AC
  Filled 2018-06-29: qty 2

## 2018-06-29 MED ORDER — MIDAZOLAM HCL (PF) 10 MG/2ML IJ SOLN
INTRAMUSCULAR | Status: DC | PRN
  Administered 2018-06-29 (×2): 2 mg via INTRAVENOUS

## 2018-06-29 MED ORDER — FENTANYL CITRATE (PF) 100 MCG/2ML IJ SOLN
INTRAMUSCULAR | Status: DC | PRN
  Administered 2018-06-29: 25 ug via INTRAVENOUS

## 2018-06-29 NOTE — Progress Notes (Signed)
PROGRESS NOTE    Jeremy [name]  DVV:[id number] DOB: [birth year] DOA: 06/20/2018 PCP: Jeremy Squibb, MD   Brief Narrative: Jeremy [name] is a 73 y.o. male with a history of CVA, hypertension. Patient presented secondary to a seizure-like episode. He required intubation for airway protection and started on AEDs. Extubated on 06/24/18. Acute stroke noticed on MRI from 06/20/18. Now undergoing stroke workup. Transthoracic Echocardiogram performed.    Assessment & Plan:   Principal Problem:   Seizure ([place]) Active Problems:   HYPERCHOLESTEROLEMIA, PURE   Essential hypertension   Acute encephalopathy   Acute respiratory failure with hypoxia (HCC)   Hypernatremia   CKD (chronic kidney disease), stage III (HCC)   Cerebral thrombosis with cerebral infarction   Seizure Clinical suspicion based on history. EEG was not significant for seizure at the time of study. Thought secondary to known history of stroke. -Vimpat 100 BID, Keppra 500 mg BID, phenytoin 125 mg TID  Acute encephalopathy Likely metabolic from seizure but unknown.  Acute respiratory failure with hypoxia Patient required mechanical ventilation from 1/29 to 2/2. Weaned to room air.  Acute/subacute occipital lobe CVA Per neuro, thought to be embolic. Transthoracic Echocardiogram significant for no thrombus. Discussed with neurology today. Plan for full stroke workup. -Continue aspirin and increased Crestor 20 mg -Neurology recommendations: CTA head/neck, increase statin therapy. Transesophageal Echocardiogram planned. No loop secondary to patient's low EF. Will need medical management by cardiology and future loop vs ICD depending on improvement of EF.   Essential hypertension Not controlled -Continue Coreg and Imdur -Continue losartan 25 mg daily  Hyperlipidemia History of CAD -Continue Crestor and aspirin  Chronic systolic heart failure EF of 35-40%. Stable. Currently not on an ACEi -Continue Coreg, Bidil -Continue  losartan 25 mg daily  CKD stage III Stable  History of stroke -Crestor, aspirin as mentioned above  Sleeplessness Has this issue as an outpatient at times. Takes medication but is unsure of what he takes. -Trazodone prn  Hypernatremia Secondary to poor oral fluid intake. Improved with IV fluids -Encourage oral fluid intake  Hypokalemia -potassium supplementation  Oral candidiasis -Magic mouth wash   DVT prophylaxis: Heparin subq Code Status:   Code Status: Full Code Family Communication: Girlfriend at bedside Disposition Plan: Discharge to SNF when stroke workup complete. (insurance declined CIR)   Consultants:   Neurology  Cardiology  Procedures:   Transthoracic Echocardiogram (06/22/2018) IMPRESSIONS    1. The left ventricle has moderately reduced systolic function of 62-69%. The cavity size is normal. There is severe left ventricular wall thickness. Echo evidence of pseudonormal diastolic filling patterns.  2. Normal left atrial size.  3. Normal right atrial size.  4. The mitral valve normal in structure. Regurgitation is mild by color flow Doppler.  5. Normal tricuspid valve.  6. Tricuspid regurgitation is mild.  7. The aortic valve tricuspid. There is mild thickening of the aortic valve.  8. No atrial level shunt detected by color flow Doppler.  Antimicrobials:  Ceftriaxone (1/29)    Subjective: Throat pain improving slightly.  Objective: Vitals:   06/28/18 2115 06/29/18 0434 06/29/18 0741 06/29/18 1000  BP:  (!) 156/81  (!) 169/74  Pulse:  70 71 68  Resp:  19 16   Temp:  (!) 97.5 F (36.4 C)    TempSrc:  Oral    SpO2: 90% 95% 95%   Weight:  94.5 kg    Height:       No intake or output data in the 24 hours ending  06/29/18 1310 Filed Weights   06/27/18 0500 06/28/18 0334 06/29/18 0434  Weight: 96.4 kg 95.3 kg 94.5 kg    Examination:  General exam: Appears calm and comfortable Respiratory system: Clear to auscultation. Respiratory  effort normal. Cardiovascular system: S1 & S2 heard, RRR. No murmurs, rubs, gallops or clicks. Gastrointestinal system: Abdomen is nondistended, soft and nontender. No organomegaly or masses felt. Normal bowel sounds heard. Central nervous system: Alert and oriented. No focal neurological deficits. Extremities: No edema. No calf tenderness Skin: No cyanosis. No rashes Psychiatry: Judgement and insight appear normal. Mood & affect appropriate.     Data Reviewed: I have personally reviewed following labs and imaging studies  CBC: Recent Labs  Lab 06/23/18 0905 06/23/18 1230 06/24/18 2035 06/25/18 1115 06/26/18 0441 06/27/18 0438  WBC 9.5  --   --  9.8 9.8 7.6  HGB 13.1 12.6* 13.6 15.1 14.2 13.9  HCT 40.7 37.0* 40.0 45.7 43.6 43.9  MCV 95.8  --   --  91.8 92.2 94.2  PLT 168  --   --  227 211 024   Basic Metabolic Panel: Recent Labs  Lab 06/22/18 1533  06/23/18 0626  06/23/18 1623 06/24/18 0519 06/24/18 1617  06/25/18 1115 06/26/18 0441 06/27/18 0438 06/27/18 1808 06/28/18 0349  NA  --   --  144   < >  --   --   --    < > 146* 146* 148* 147* 145  K  --   --  5.1   < >  --   --   --    < > 3.2* 3.1* 3.4* 4.0 3.8  CL  --    < > 118*  --   --   --   --   --  105 108 112* 110 109  CO2  --    < > 17*  --   --   --   --   --  27 26 30 30 25   GLUCOSE  --    < > 101*  --   --   --   --   --  103* 125* 96 111* 95  BUN  --    < > 27*  --   --   --   --   --  32* 42* 31* 32* 28*  CREATININE  --    < > 1.26*  --   --   --   --   --  1.14 1.26* 1.24 1.20 1.21  CALCIUM  --    < > 8.1*  --   --   --   --   --  9.2 9.0 9.1 9.3 8.8*  MG  --    < > 2.2  --  2.1 2.1 1.9  --  2.1 2.3  --   --   --   PHOS 2.2*  --  4.1  --  3.2  --   --   --  2.7 2.2*  --   --   --    < > = values in this interval not displayed.   GFR: Estimated Creatinine Clearance: 61.5 mL/min (by C-G formula based on SCr of 1.21 mg/dL). Liver Function Tests: Recent Labs  Lab 06/27/18 0438  ALBUMIN 3.0*   No  results for input(s): LIPASE, AMYLASE in the last 168 hours. Recent Labs  Lab 06/27/18 0438  AMMONIA 18   Coagulation Profile: No results for input(s): INR, PROTIME in the last 168 hours. Cardiac  Enzymes: Recent Labs  Lab 06/23/18 0626  TROPONINI 1.01*   BNP (last 3 results) No results for input(s): PROBNP in the last 8760 hours. HbA1C: No results for input(s): HGBA1C in the last 72 hours. CBG: Recent Labs  Lab 06/24/18 2322 06/25/18 0319 06/25/18 0808 06/25/18 1219 06/25/18 1559  GLUCAP 100* 97 101* 101* 111*   Lipid Profile: No results for input(s): CHOL, HDL, LDLCALC, TRIG, CHOLHDL, LDLDIRECT in the last 72 hours. Thyroid Function Tests: No results for input(s): TSH, T4TOTAL, FREET4, T3FREE, THYROIDAB in the last 72 hours. Anemia Panel: No results for input(s): VITAMINB12, FOLATE, FERRITIN, TIBC, IRON, RETICCTPCT in the last 72 hours. Sepsis Labs: No results for input(s): PROCALCITON, LATICACIDVEN in the last 168 hours.  Recent Results (from the past 240 hour(s))  CSF culture     Status: None   Collection Time: 06/20/18  1:23 PM  Result Value Ref Range Status   Specimen Description CSF  Final   Special Requests NONE  Final   Gram Stain   Final    NO ORGANISMS SEEN WBC PRESENT, PREDOMINANTLY MONONUCLEAR CYTOSPIN SMEAR Gram Stain Report Called to,Read Back By and Verified With: MINTER R. AT 1450 ON [id number] BY THOMPSON S. Performed at [facility], [address] [place], [place] [address]    Culture NO GROWTH  Final   Report Status 06/23/2018 FINAL  Final  MRSA PCR Screening     Status: None   Collection Time: 06/20/18  4:00 PM  Result Value Ref Range Status   MRSA by PCR NEGATIVE NEGATIVE Final    Comment:        The GeneXpert MRSA Assay (FDA approved for NASAL specimens only), is one component of a comprehensive MRSA colonization surveillance program. It is not intended to diagnose MRSA infection nor to guide or monitor treatment for MRSA  infections. Performed at [facility], [place] [address] [place], [place] [address]   Culture, blood (Routine X 2) w Reflex to ID Panel     Status: None   Collection Time: 06/22/18  3:05 PM  Result Value Ref Range Status   Specimen Description BLOOD RIGHT HAND  Final   Special Requests   Final    BOTTLES DRAWN AEROBIC ONLY Blood Culture adequate volume   Culture   Final    NO GROWTH 5 DAYS Performed at [facility], [place] [address] [place], [place] [address]    Report Status 06/27/2018 FINAL  Final  Culture, blood (Routine X 2) w Reflex to ID Panel     Status: None   Collection Time: 06/22/18  3:33 PM  Result Value Ref Range Status   Specimen Description BLOOD RIGHT ARM  Final   Special Requests   Final    BOTTLES DRAWN AEROBIC ONLY Blood Culture adequate volume   Culture   Final    NO GROWTH 5 DAYS Performed at [facility], [place] [address], [place], [place] [address]    Report Status 06/27/2018 FINAL  Final         Radiology Studies: Ct Angio Head W Or Wo Contrast  Result Date: 06/27/2018 CLINICAL DATA:  73 y/o  M; EXAM: CT ANGIOGRAPHY HEAD AND NECK TECHNIQUE: Multidetector CT imaging of the head and neck was performed using the standard protocol during bolus administration of intravenous contrast. Multiplanar CT image reconstructions and MIPs were obtained to evaluate the vascular anatomy. Carotid stenosis measurements (when applicable) are obtained utilizing NASCET criteria, using the distal internal carotid diameter  as the denominator. CONTRAST:  39mL ISOVUE-370 IOPAMIDOL (ISOVUE-370) INJECTION 76% COMPARISON:  06/21/2018 CT head. 10/26/2017 MRI and MRA head. FINDINGS: CT HEAD FINDINGS Brain: Small focus of recent stroke in the left occipital lobe is similar in distribution of prior MRI given differences in technique. No evidence of new acute infarction, hemorrhage, hydrocephalus, extra-axial collection or mass lesion/mass effect. Chronic right posterior MCA  distribution infarction. Stable chronic microvascular ischemic changes and volume loss of the brain. Vascular: Calcific atherosclerosis of carotid siphons and vertebral arteries. Skull: Normal. Negative for fracture or focal lesion. Sinuses: Normal aeration of paranasal sinuses. Partial opacification of the right mastoid air cells. Normal aeration of the left mastoid air cells. Orbits: No acute finding. Review of the MIP images confirms the above findings CTA NECK FINDINGS Aortic arch: Bovine variant branching. Imaged portion shows no evidence of aneurysm or dissection. No significant stenosis of the major arch vessel origins. Calcific aortic atherosclerosis. Right carotid system: No evidence of dissection, stenosis (50% or greater) or occlusion. Left carotid system: No evidence of dissection, stenosis (50% or greater) or occlusion. Non stenotic calcified plaque of the carotid bifurcation. Vertebral arteries: Right dominant. Segments of predominantly fibrofatty plaque with up to moderate 50% stenosis of the V3 segment. Patent diminutive left vertebral artery. Skeleton: Moderate spondylosis of the cervical spine predominant discogenic degenerative changes. No high-grade bony spinal canal stenosis. No acute osseous abnormality is evident. Other neck: Negative. Upper chest: Negative. Review of the MIP images confirms the above findings CTA HEAD FINDINGS Anterior circulation: Calcific atherosclerosis of carotid siphons with mild right and moderate left paraclinoid ICA stenosis. Right M2 inferior division origin moderate stenosis. Severe proximal right A1 stenosis. Tandem segments of moderate to severe stenosis of left A2. mild right A2 stenosis. No large vessel occlusion, aneurysm, or vascular malformation. Posterior circulation: Multiple segments of mild-to-moderate stenosis in the bilateral P2 segments. No large vessel occlusion, aneurysm, or vascular malformation. Venous sinuses: As permitted by contrast timing,  patent. Anatomic variants: None significant. Delayed phase: No abnormal intracranial enhancement. Review of the MIP images confirms the above findings IMPRESSION: CT head: 1. Small focus of recent stroke in the left occipital lobe is similar in distribution to prior MRI given differences in technique. 2. No new acute intracranial abnormality identified. 3. Chronic right posterior MCA distribution infarct. Stable chronic microvascular ischemic changes and volume loss of the brain. CTA neck: 1. Moderate 50% stenosis of the V3 segment of the right vertebral artery. 2. Otherwise no hemodynamically significant stenosis or occlusion of the carotid and vertebral arteries of the neck. 3. Moderate spondylosis of the cervical spine predominant discogenic degenerative changes. CTA head: 1. No large vessel occlusion, aneurysm, or vascular malformation. 2. Stable advanced intracranial atherosclerosis with multiple segments of stenosis in the anterior and posterior circulation. Electronically Signed   By: Kristine Garbe M.D.   On: 06/27/2018 16:14   Ct Angio Neck W Or Wo Contrast  Result Date: 06/27/2018 CLINICAL DATA:  73 y/o  M; EXAM: CT ANGIOGRAPHY HEAD AND NECK TECHNIQUE: Multidetector CT imaging of the head and neck was performed using the standard protocol during bolus administration of intravenous contrast. Multiplanar CT image reconstructions and MIPs were obtained to evaluate the vascular anatomy. Carotid stenosis measurements (when applicable) are obtained utilizing NASCET criteria, using the distal internal carotid diameter as the denominator. CONTRAST:  24mL ISOVUE-370 IOPAMIDOL (ISOVUE-370) INJECTION 76% COMPARISON:  06/21/2018 CT head. 10/26/2017 MRI and MRA head. FINDINGS: CT HEAD FINDINGS Brain: Small focus of recent stroke in the  left occipital lobe is similar in distribution of prior MRI given differences in technique. No evidence of new acute infarction, hemorrhage, hydrocephalus, extra-axial  collection or mass lesion/mass effect. Chronic right posterior MCA distribution infarction. Stable chronic microvascular ischemic changes and volume loss of the brain. Vascular: Calcific atherosclerosis of carotid siphons and vertebral arteries. Skull: Normal. Negative for fracture or focal lesion. Sinuses: Normal aeration of paranasal sinuses. Partial opacification of the right mastoid air cells. Normal aeration of the left mastoid air cells. Orbits: No acute finding. Review of the MIP images confirms the above findings CTA NECK FINDINGS Aortic arch: Bovine variant branching. Imaged portion shows no evidence of aneurysm or dissection. No significant stenosis of the major arch vessel origins. Calcific aortic atherosclerosis. Right carotid system: No evidence of dissection, stenosis (50% or greater) or occlusion. Left carotid system: No evidence of dissection, stenosis (50% or greater) or occlusion. Non stenotic calcified plaque of the carotid bifurcation. Vertebral arteries: Right dominant. Segments of predominantly fibrofatty plaque with up to moderate 50% stenosis of the V3 segment. Patent diminutive left vertebral artery. Skeleton: Moderate spondylosis of the cervical spine predominant discogenic degenerative changes. No high-grade bony spinal canal stenosis. No acute osseous abnormality is evident. Other neck: Negative. Upper chest: Negative. Review of the MIP images confirms the above findings CTA HEAD FINDINGS Anterior circulation: Calcific atherosclerosis of carotid siphons with mild right and moderate left paraclinoid ICA stenosis. Right M2 inferior division origin moderate stenosis. Severe proximal right A1 stenosis. Tandem segments of moderate to severe stenosis of left A2. mild right A2 stenosis. No large vessel occlusion, aneurysm, or vascular malformation. Posterior circulation: Multiple segments of mild-to-moderate stenosis in the bilateral P2 segments. No large vessel occlusion, aneurysm, or vascular  malformation. Venous sinuses: As permitted by contrast timing, patent. Anatomic variants: None significant. Delayed phase: No abnormal intracranial enhancement. Review of the MIP images confirms the above findings IMPRESSION: CT head: 1. Small focus of recent stroke in the left occipital lobe is similar in distribution to prior MRI given differences in technique. 2. No new acute intracranial abnormality identified. 3. Chronic right posterior MCA distribution infarct. Stable chronic microvascular ischemic changes and volume loss of the brain. CTA neck: 1. Moderate 50% stenosis of the V3 segment of the right vertebral artery. 2. Otherwise no hemodynamically significant stenosis or occlusion of the carotid and vertebral arteries of the neck. 3. Moderate spondylosis of the cervical spine predominant discogenic degenerative changes. CTA head: 1. No large vessel occlusion, aneurysm, or vascular malformation. 2. Stable advanced intracranial atherosclerosis with multiple segments of stenosis in the anterior and posterior circulation. Electronically Signed   By: Kristine Garbe M.D.   On: 06/27/2018 16:14        Scheduled Meds: . aspirin  81 mg Per Tube Daily  . carvedilol  12.5 mg Oral BID WC  . clopidogrel  75 mg Oral Daily  . famotidine  20 mg Oral BID  . gabapentin  300 mg Oral BID  . heparin injection (subcutaneous)  5,000 Units Subcutaneous Q8H  . ipratropium-albuterol  3 mL Nebulization BID  . isosorbide-hydrALAZINE  1 tablet Oral TID  . lacosamide  100 mg Oral BID  . levETIRAcetam  500 mg Oral BID  . losartan  25 mg Oral Daily  . magic mouthwash  5 mL Oral QID  . multivitamin with minerals  1 tablet Per Tube Daily  . phenytoin  125 mg Oral TID  . rosuvastatin  20 mg Per Tube q1800   Continuous Infusions:  LOS: 9 days     Cordelia Poche, MD [facility] 06/29/2018, 1:10 PM  If 7PM-7AM, please contact night-coverage [email or web address]

## 2018-06-29 NOTE — CV Procedure (Signed)
TEE  Pt sedated with IV Propofol and Versed  TEE probe advanced to mid esophagus without problem     LA, LAA without masses No PFO by color doppler or with injection of agitated saline  MV normal  Mild MR TV is normal    AV is mildly thickened   Trivial AI PV is normal LVEF appears mild to mod depressed Mild fixed plaquing in the thoracic aorta.    Full report to follow.

## 2018-06-29 NOTE — Progress Notes (Signed)
   OT treatment  Pt is a 73 y.o. male admitted 06/20/18 with seizure-like episode, acute loss of consciousness followed by severe combativeness. MRI showed L occipital areas of restricted diffusion. ETT 1/29-2/2. PMH includes HTN, CAD< GERD, carotid artery stenosis.  treat: Pt performing mobility with increased independence at this time. Pt performing mobility in room with no AD, good safety awareness and no LOB episodes or scissoring of gait. Pt performing ADL tasks for upper body with modified independence; lower body ADL with set-upA. Pt performing coordination tasks with no difficulty; visual fields are accurate. Pt reporting no sensation changes. Pt would benefit from continued OT skilled services for ADL, mobility and safety in [place] setting.   Recommend: SNF versus HHOT with intermittent supervisionA   Charges: OT eval : self care 8-22 mins  Domingo Mend, OTR/L  Ebony Hail Harold Hedge) Marsa Aris OTR/L Acute Rehabilitation Services Pager: [phone] Office: [phone]   ** Pt away for TEE and unable to copy entire note to this page.

## 2018-06-29 NOTE — Progress Notes (Signed)
  Echocardiogram Echocardiogram Transesophageal has been performed.  Jeremy [name] 06/29/2018, 4:44 PM

## 2018-06-29 NOTE — Progress Notes (Addendum)
Physical Therapy Treatment Patient Details Name: Jeremy [name] MRN: [id number] DOB: 1945/12/07 Today's Date: 06/29/2018    History of Present Illness Pt is a 73 y.o. male admitted 06/20/18 with seizure-like episode, acute loss of consciousness followed by severe combativeness. MRI showed L occipital areas of restricted diffusion. ETT 1/29-2/2. PMH includes HTN, CAD< GERD, carotid artery stenosis.    PT Comments    Pt received in chair asleep, easily awakened. Pt reports near-full resolution of acute visual impairment. AMB remains limited in stability, still with scissoring intermittently, which is worse with head turns. Pt tolerates simple balance interventions in room at end of session, balance is improving significantly since prior sessions. Pt doing better with following complex commands for treatment this date.     Follow Up Recommendations  SNF;Supervision/Assistance - 24 hour     Equipment Recommendations       Recommendations for Other Services Rehab consult     Precautions / Restrictions Precautions Precautions: Fall Precaution Comments: Impulsive Restrictions Weight Bearing Restrictions: No    Mobility  Bed Mobility               General bed mobility comments: pt in recliner upon arrival  Transfers Overall transfer level: Needs assistance Equipment used: None Transfers: Sit to/from Stand Sit to Stand: Supervision;From elevated surface         General transfer comment: 10x hands free for exercise/dynamic balance training   Ambulation/Gait Ambulation/Gait assistance: Min assist Gait Distance (Feet): 140 Feet Assistive device: 1 person hand held assist Gait Pattern/deviations: Step-through pattern;Decreased stride length;Staggering left;Staggering right;Scissoring     General Gait Details: a little staggering, but mod for support, then cued for head turns and wall scannign for return to room, with significant decline in gait stability.    Stairs             Wheelchair Mobility    Modified Rankin (Stroke Patients Only)       Balance Overall balance assessment: Needs assistance         Standing balance support: During functional activity Standing balance-Leahy Scale: Good                              Cognition Arousal/Alertness: Awake/alert Behavior During Therapy: WFL for tasks assessed/performed Overall Cognitive Status: Within Functional Limits for tasks assessed                                        Exercises Other Exercises Other Exercises: Narrow stance eyes closed 2x30sec;  Other Exercises: fwd step taps without UE support x16, alternating pattern  Other Exercises: AMB with head turns cued (28ft)  Other Exercises: narrow stance with bilat horizontal head turns 10x q directions    General Comments        Pertinent Vitals/Pain Pain Assessment: No/denies pain    Home Living                      Prior Function            PT Goals (current goals can now be found in the care plan section) Acute Rehab PT Goals Patient Stated Goal: Drink some ice water PT Goal Formulation: With patient Time For Goal Achievement: 07/08/18 Potential to Achieve Goals: Fair Progress towards PT goals: Progressing toward goals    Frequency    Min  3X/week      PT Plan Current plan remains appropriate    Co-evaluation              AM-PAC PT "6 Clicks" Mobility   Outcome Measure  Help needed turning from your back to your side while in a flat bed without using bedrails?: A Little Help needed moving from lying on your back to sitting on the side of a flat bed without using bedrails?: A Little Help needed moving to and from a bed to a chair (including a wheelchair)?: A Little Help needed standing up from a chair using your arms (e.g., wheelchair or bedside chair)?: A Little Help needed to walk in hospital room?: A Little Help needed climbing 3-5 steps with a railing? : A  Lot 6 Click Score: 17    End of Session Equipment Utilized During Treatment: Gait belt Activity Tolerance: Patient tolerated treatment well Patient left: with call bell/phone within reach;in chair;with family/visitor present Nurse Communication: Mobility status PT Visit Diagnosis: Muscle weakness (generalized) (M62.81);Other abnormalities of gait and mobility (R26.89)     Time: 1751-0258 PT Time Calculation (min) (ACUTE ONLY): 10 min  Charges:  $Neuromuscular Re-education: 8-22 mins                     1:43 PM, 06/29/18 Etta Grandchild, PT, DPT Physical Therapist - [place] [phone] (Pager)  [phone] (Office)      Edd Reppert C 06/29/2018, 1:41 PM

## 2018-06-29 NOTE — Progress Notes (Signed)
Stroke Team  PROGRESS NOTE  Subjective: Jeremy [name] Kitchen Girlfriend is at the bedside. She corroborates his history that he has not had a documented stroke in the past. Patient has been seen by cardiology and TEE is planned but I'm not sure it is on the schedule for today. No neurological changes. Exam: Vitals:   06/29/18 1600 06/29/18 1605  BP: (!) 192/66 (!) 214/69  Pulse: 70 71  Resp: 20 19  Temp:    SpO2: 97% 98%    Physical Exam  HEENT-  Normocephalic, no lesions, without obvious abnormality.  Normal external eye and conjunctiva.  Extremities- Warm, dry and intact Musculoskeletal-no joint tenderness, deformity or swelling Skin-warm and dry, face is red and appears irritated  Neuro:  Mental Status: Alert, oriented, thought content appropriate.  Hypophonic fluent without evidence of aphasia.  Able to follow 2 step commands without difficulty. Cranial Nerves: II:  Visual fields grossly normal,  III,IV, VI: ptosis on right eye, extra-ocular motions intact bilaterally pupils equal, round, reactive to light and accommodation V,VII: smile symmetric, facial light touch sensation normal bilaterally VIII: hearing normal bilaterally Motor: Moving all extremities antigravity Sensory: Pinprick and light touch intact throughout, bilaterally Plantars: Right: downgoing   Left: downgoing Cerebellar: normal finger-to-nose,    Medications:  Scheduled: . [MAR Hold] aspirin  81 mg Per Tube Daily  . [MAR Hold] carvedilol  12Jeremy [name]5 mg Oral BID WC  . [MAR Hold] clopidogrel  75 mg Oral Daily  . [MAR Hold] famotidine  20 mg Oral BID  . [MAR Hold] gabapentin  300 mg Oral BID  . [MAR Hold] heparin injection (subcutaneous)  5,000 Units Subcutaneous Q8H  . [MAR Hold] ipratropium-albuterol  3 mL Nebulization BID  . [MAR Hold] isosorbide-hydrALAZINE  1 tablet Oral TID  . [MAR Hold] lacosamide  100 mg Oral BID  . [MAR Hold] levETIRAcetam  500 mg Oral BID  . [MAR Hold] losartan  25 mg Oral Daily  . [MAR Hold] magic  mouthwash  5 mL Oral QID  . [MAR Hold] multivitamin with minerals  1 tablet Per Tube Daily  . [MAR Hold] phenytoin  125 mg Oral TID  . [MAR Hold] rosuvastatin  20 mg Per Tube q1800   Continuous:   Pertinent Labs/Diagnostics: No new pertinent labs      Assessment: 73 year old male with known history of right carotid stenosis and old right parietal infarct presenting with new onset seizure.   1. No further seizure-like activity.  Maintained on Keppra, Dilantin and Vimpat. He is much more awake today and following commands. 2. 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  Seizures at this point have resolved. 3. New embolic-looking punctate infarcts in the left occipital lobe.  Recommendations:    - Continue aspirin and plavix x 3 weeks and then aspirin alone -   continue Keppra, Dilantin and Vimpat at current doses Recommend transesophageal echocardiogram to look for cardiac source of embolism. May benefit with loop recorder but if cardiology is considering defibrillator due to his low ejection fraction this may substitute instead. Maintain aggressive risk factor modification with blood pressure goal below 130/90, lipids with LDL cholesterol goal below 70 mg percent and diabetes with inflammation A1c goal below 6Jeremy [name]5. Discussed with cardiology team and Dr. Lonny Prude.    Jeremy [place]uth, MD 06/29/2018, 4:09 PM

## 2018-06-29 NOTE — Progress Notes (Signed)
SLP Cancellation Note  Patient Details Name: Jeremy [name] MRN: [id number] DOB: 10/31/1945   Cancelled treatment:       Reason Eval/Treat Not Completed: Other (comment) MBS scheduled for this morning is now postponed as pt is NPO pending TEE, scheduled for later today. Will f/u for completion of MBS as able. After TEE, would resume previously recommended diet (Dys 1, pudding thick liquids) until MBS is done.   Venita Sheffield [name] 06/29/2018, 10:02 AM  Germain Osgood [name], M.A. [place] [facility] Environmental education officer [phone] Office [phone]

## 2018-06-29 NOTE — Interval H&P Note (Signed)
History and Physical Interval Note:  06/29/2018 2:52 PM  Jeremy [name]  has presented today for surgery, with the diagnosis of STROKE  The various methods of treatment have been discussed with the patient and family. After consideration of risks, benefits and other options for treatment, the patient has consented to  Procedure(s): TRANSESOPHAGEAL ECHOCARDIOGRAM (TEE) (N/A) as a surgical intervention .  The patient's history has been reviewed, patient examined, no change in status, stable for surgery.  I have reviewed the patient's chart and labs.  Questions were answered to the patient's satisfaction.     [name]

## 2018-06-29 NOTE — H&P (View-Only) (Signed)
PROGRESS NOTE    BRAEDYN KAUK  VZD:[id number] DOB: 03-11-1946 DOA: 06/20/2018 PCP: Celene Squibb, MD   Brief Narrative: DELRICO MINEHART is a 73 y.o. male with a history of CVA, hypertension. Patient presented secondary to a seizure-like episode. He required intubation for airway protection and started on AEDs. Extubated on 06/24/18. Acute stroke noticed on MRI from 06/20/18. Now undergoing stroke workup. Transthoracic Echocardiogram performed.    Assessment & Plan:   Principal Problem:   Seizure ([place]) Active Problems:   HYPERCHOLESTEROLEMIA, PURE   Essential hypertension   Acute encephalopathy   Acute respiratory failure with hypoxia (HCC)   Hypernatremia   CKD (chronic kidney disease), stage III (HCC)   Cerebral thrombosis with cerebral infarction   Seizure Clinical suspicion based on history. EEG was not significant for seizure at the time of study. Thought secondary to known history of stroke. -Vimpat 100 BID, Keppra 500 mg BID, phenytoin 125 mg TID  Acute encephalopathy Likely metabolic from seizure but unknown.  Acute respiratory failure with hypoxia Patient required mechanical ventilation from 1/29 to 2/2. Weaned to room air.  Acute/subacute occipital lobe CVA Per neuro, thought to be embolic. Transthoracic Echocardiogram significant for no thrombus. Discussed with neurology today. Plan for full stroke workup. -Continue aspirin and increased Crestor 20 mg -Neurology recommendations: CTA head/neck, increase statin therapy. Transesophageal Echocardiogram planned. No loop secondary to patient's low EF. Will need medical management by cardiology and future loop vs ICD depending on improvement of EF.   Essential hypertension Not controlled -Continue Coreg and Imdur -Continue losartan 25 mg daily  Hyperlipidemia History of CAD -Continue Crestor and aspirin  Chronic systolic heart failure EF of 35-40%. Stable. Currently not on an ACEi -Continue Coreg, Bidil -Continue  losartan 25 mg daily  CKD stage III Stable  History of stroke -Crestor, aspirin as mentioned above  Sleeplessness Has this issue as an outpatient at times. Takes medication but is unsure of what he takes. -Trazodone prn  Hypernatremia Secondary to poor oral fluid intake. Improved with IV fluids -Encourage oral fluid intake  Hypokalemia -potassium supplementation  Oral candidiasis -Magic mouth wash   DVT prophylaxis: Heparin subq Code Status:   Code Status: Full Code Family Communication: Girlfriend at bedside Disposition Plan: Discharge to SNF when stroke workup complete. (insurance declined CIR)   Consultants:   Neurology  Cardiology  Procedures:   Transthoracic Echocardiogram (06/22/2018) IMPRESSIONS    1. The left ventricle has moderately reduced systolic function of 29-51%. The cavity size is normal. There is severe left ventricular wall thickness. Echo evidence of pseudonormal diastolic filling patterns.  2. Normal left atrial size.  3. Normal right atrial size.  4. The mitral valve normal in structure. Regurgitation is mild by color flow Doppler.  5. Normal tricuspid valve.  6. Tricuspid regurgitation is mild.  7. The aortic valve tricuspid. There is mild thickening of the aortic valve.  8. No atrial level shunt detected by color flow Doppler.  Antimicrobials:  Ceftriaxone (1/29)    Subjective: Throat pain improving slightly.  Objective: Vitals:   06/28/18 2115 06/29/18 0434 06/29/18 0741 06/29/18 1000  BP:  (!) 156/81  (!) 169/74  Pulse:  70 71 68  Resp:  19 16   Temp:  (!) 97.5 F (36.4 C)    TempSrc:  Oral    SpO2: 90% 95% 95%   Weight:  94.5 kg    Height:       No intake or output data in the 24 hours ending  06/29/18 1310 Filed Weights   06/27/18 0500 06/28/18 0334 06/29/18 0434  Weight: 96.4 kg 95.3 kg 94.5 kg    Examination:  General exam: Appears calm and comfortable Respiratory system: Clear to auscultation. Respiratory  effort normal. Cardiovascular system: S1 & S2 heard, RRR. No murmurs, rubs, gallops or clicks. Gastrointestinal system: Abdomen is nondistended, soft and nontender. No organomegaly or masses felt. Normal bowel sounds heard. Central nervous system: Alert and oriented. No focal neurological deficits. Extremities: No edema. No calf tenderness Skin: No cyanosis. No rashes Psychiatry: Judgement and insight appear normal. Mood & affect appropriate.     Data Reviewed: I have personally reviewed following labs and imaging studies  CBC: Recent Labs  Lab 06/23/18 0905 06/23/18 1230 06/24/18 2035 06/25/18 1115 06/26/18 0441 06/27/18 0438  WBC 9.5  --   --  9.8 9.8 7.6  HGB 13.1 12.6* 13.6 15.1 14.2 13.9  HCT 40.7 37.0* 40.0 45.7 43.6 43.9  MCV 95.8  --   --  91.8 92.2 94.2  PLT 168  --   --  227 211 102   Basic Metabolic Panel: Recent Labs  Lab 06/22/18 1533  06/23/18 0626  06/23/18 1623 06/24/18 0519 06/24/18 1617  06/25/18 1115 06/26/18 0441 06/27/18 0438 06/27/18 1808 06/28/18 0349  NA  --   --  144   < >  --   --   --    < > 146* 146* 148* 147* 145  K  --   --  5.1   < >  --   --   --    < > 3.2* 3.1* 3.4* 4.0 3.8  CL  --    < > 118*  --   --   --   --   --  105 108 112* 110 109  CO2  --    < > 17*  --   --   --   --   --  27 26 30 30 25   GLUCOSE  --    < > 101*  --   --   --   --   --  103* 125* 96 111* 95  BUN  --    < > 27*  --   --   --   --   --  32* 42* 31* 32* 28*  CREATININE  --    < > 1.26*  --   --   --   --   --  1.14 1.26* 1.24 1.20 1.21  CALCIUM  --    < > 8.1*  --   --   --   --   --  9.2 9.0 9.1 9.3 8.8*  MG  --    < > 2.2  --  2.1 2.1 1.9  --  2.1 2.3  --   --   --   PHOS 2.2*  --  4.1  --  3.2  --   --   --  2.7 2.2*  --   --   --    < > = values in this interval not displayed.   GFR: Estimated Creatinine Clearance: 61.5 mL/min (by C-G formula based on SCr of 1.21 mg/dL). Liver Function Tests: Recent Labs  Lab 06/27/18 0438  ALBUMIN 3.0*   No  results for input(s): LIPASE, AMYLASE in the last 168 hours. Recent Labs  Lab 06/27/18 0438  AMMONIA 18   Coagulation Profile: No results for input(s): INR, PROTIME in the last 168 hours. Cardiac  Enzymes: Recent Labs  Lab 06/23/18 0626  TROPONINI 1.01*   BNP (last 3 results) No results for input(s): PROBNP in the last 8760 hours. HbA1C: No results for input(s): HGBA1C in the last 72 hours. CBG: Recent Labs  Lab 06/24/18 2322 06/25/18 0319 06/25/18 0808 06/25/18 1219 06/25/18 1559  GLUCAP 100* 97 101* 101* 111*   Lipid Profile: No results for input(s): CHOL, HDL, LDLCALC, TRIG, CHOLHDL, LDLDIRECT in the last 72 hours. Thyroid Function Tests: No results for input(s): TSH, T4TOTAL, FREET4, T3FREE, THYROIDAB in the last 72 hours. Anemia Panel: No results for input(s): VITAMINB12, FOLATE, FERRITIN, TIBC, IRON, RETICCTPCT in the last 72 hours. Sepsis Labs: No results for input(s): PROCALCITON, LATICACIDVEN in the last 168 hours.  Recent Results (from the past 240 hour(s))  CSF culture     Status: None   Collection Time: 06/20/18  1:23 PM  Result Value Ref Range Status   Specimen Description CSF  Final   Special Requests NONE  Final   Gram Stain   Final    NO ORGANISMS SEEN WBC PRESENT, PREDOMINANTLY MONONUCLEAR CYTOSPIN SMEAR Gram Stain Report Called to,Read Back By and Verified With: MINTER R. AT 1450 ON [id number] BY THOMPSON S. Performed at [facility], [address], [place], [place] [address]    Culture NO GROWTH  Final   Report Status 06/23/2018 FINAL  Final  MRSA PCR Screening     Status: None   Collection Time: 06/20/18  4:00 PM  Result Value Ref Range Status   MRSA by PCR NEGATIVE NEGATIVE Final    Comment:        The GeneXpert MRSA Assay (FDA approved for NASAL specimens only), is one component of a comprehensive MRSA colonization surveillance program. It is not intended to diagnose MRSA infection nor to guide or monitor treatment for MRSA  infections. Performed at [facility], [place] [address], [place], [place] [address]   Culture, blood (Routine X 2) w Reflex to ID Panel     Status: None   Collection Time: 06/22/18  3:05 PM  Result Value Ref Range Status   Specimen Description BLOOD RIGHT HAND  Final   Special Requests   Final    BOTTLES DRAWN AEROBIC ONLY Blood Culture adequate volume   Culture   Final    NO GROWTH 5 DAYS Performed at [facility], [place] [address], [place], [place] [address]    Report Status 06/27/2018 FINAL  Final  Culture, blood (Routine X 2) w Reflex to ID Panel     Status: None   Collection Time: 06/22/18  3:33 PM  Result Value Ref Range Status   Specimen Description BLOOD RIGHT ARM  Final   Special Requests   Final    BOTTLES DRAWN AEROBIC ONLY Blood Culture adequate volume   Culture   Final    NO GROWTH 5 DAYS Performed at [facility], [place] [address] [place], [place] [address]    Report Status 06/27/2018 FINAL  Final         Radiology Studies: Ct Angio Head W Or Wo Contrast  Result Date: 06/27/2018 CLINICAL DATA:  73 y/o  M; EXAM: CT ANGIOGRAPHY HEAD AND NECK TECHNIQUE: Multidetector CT imaging of the head and neck was performed using the standard protocol during bolus administration of intravenous contrast. Multiplanar CT image reconstructions and MIPs were obtained to evaluate the vascular anatomy. Carotid stenosis measurements (when applicable) are obtained utilizing NASCET criteria, using the distal internal carotid diameter  as the denominator. CONTRAST:  23mL ISOVUE-370 IOPAMIDOL (ISOVUE-370) INJECTION 76% COMPARISON:  06/21/2018 CT head. 10/26/2017 MRI and MRA head. FINDINGS: CT HEAD FINDINGS Brain: Small focus of recent stroke in the left occipital lobe is similar in distribution of prior MRI given differences in technique. No evidence of new acute infarction, hemorrhage, hydrocephalus, extra-axial collection or mass lesion/mass effect. Chronic right posterior MCA  distribution infarction. Stable chronic microvascular ischemic changes and volume loss of the brain. Vascular: Calcific atherosclerosis of carotid siphons and vertebral arteries. Skull: Normal. Negative for fracture or focal lesion. Sinuses: Normal aeration of paranasal sinuses. Partial opacification of the right mastoid air cells. Normal aeration of the left mastoid air cells. Orbits: No acute finding. Review of the MIP images confirms the above findings CTA NECK FINDINGS Aortic arch: Bovine variant branching. Imaged portion shows no evidence of aneurysm or dissection. No significant stenosis of the major arch vessel origins. Calcific aortic atherosclerosis. Right carotid system: No evidence of dissection, stenosis (50% or greater) or occlusion. Left carotid system: No evidence of dissection, stenosis (50% or greater) or occlusion. Non stenotic calcified plaque of the carotid bifurcation. Vertebral arteries: Right dominant. Segments of predominantly fibrofatty plaque with up to moderate 50% stenosis of the V3 segment. Patent diminutive left vertebral artery. Skeleton: Moderate spondylosis of the cervical spine predominant discogenic degenerative changes. No high-grade bony spinal canal stenosis. No acute osseous abnormality is evident. Other neck: Negative. Upper chest: Negative. Review of the MIP images confirms the above findings CTA HEAD FINDINGS Anterior circulation: Calcific atherosclerosis of carotid siphons with mild right and moderate left paraclinoid ICA stenosis. Right M2 inferior division origin moderate stenosis. Severe proximal right A1 stenosis. Tandem segments of moderate to severe stenosis of left A2. mild right A2 stenosis. No large vessel occlusion, aneurysm, or vascular malformation. Posterior circulation: Multiple segments of mild-to-moderate stenosis in the bilateral P2 segments. No large vessel occlusion, aneurysm, or vascular malformation. Venous sinuses: As permitted by contrast timing,  patent. Anatomic variants: None significant. Delayed phase: No abnormal intracranial enhancement. Review of the MIP images confirms the above findings IMPRESSION: CT head: 1. Small focus of recent stroke in the left occipital lobe is similar in distribution to prior MRI given differences in technique. 2. No new acute intracranial abnormality identified. 3. Chronic right posterior MCA distribution infarct. Stable chronic microvascular ischemic changes and volume loss of the brain. CTA neck: 1. Moderate 50% stenosis of the V3 segment of the right vertebral artery. 2. Otherwise no hemodynamically significant stenosis or occlusion of the carotid and vertebral arteries of the neck. 3. Moderate spondylosis of the cervical spine predominant discogenic degenerative changes. CTA head: 1. No large vessel occlusion, aneurysm, or vascular malformation. 2. Stable advanced intracranial atherosclerosis with multiple segments of stenosis in the anterior and posterior circulation. Electronically Signed   By: Kristine Garbe M.D.   On: 06/27/2018 16:14   Ct Angio Neck W Or Wo Contrast  Result Date: 06/27/2018 CLINICAL DATA:  73 y/o  M; EXAM: CT ANGIOGRAPHY HEAD AND NECK TECHNIQUE: Multidetector CT imaging of the head and neck was performed using the standard protocol during bolus administration of intravenous contrast. Multiplanar CT image reconstructions and MIPs were obtained to evaluate the vascular anatomy. Carotid stenosis measurements (when applicable) are obtained utilizing NASCET criteria, using the distal internal carotid diameter as the denominator. CONTRAST:  67mL ISOVUE-370 IOPAMIDOL (ISOVUE-370) INJECTION 76% COMPARISON:  06/21/2018 CT head. 10/26/2017 MRI and MRA head. FINDINGS: CT HEAD FINDINGS Brain: Small focus of recent stroke in the  left occipital lobe is similar in distribution of prior MRI given differences in technique. No evidence of new acute infarction, hemorrhage, hydrocephalus, extra-axial  collection or mass lesion/mass effect. Chronic right posterior MCA distribution infarction. Stable chronic microvascular ischemic changes and volume loss of the brain. Vascular: Calcific atherosclerosis of carotid siphons and vertebral arteries. Skull: Normal. Negative for fracture or focal lesion. Sinuses: Normal aeration of paranasal sinuses. Partial opacification of the right mastoid air cells. Normal aeration of the left mastoid air cells. Orbits: No acute finding. Review of the MIP images confirms the above findings CTA NECK FINDINGS Aortic arch: Bovine variant branching. Imaged portion shows no evidence of aneurysm or dissection. No significant stenosis of the major arch vessel origins. Calcific aortic atherosclerosis. Right carotid system: No evidence of dissection, stenosis (50% or greater) or occlusion. Left carotid system: No evidence of dissection, stenosis (50% or greater) or occlusion. Non stenotic calcified plaque of the carotid bifurcation. Vertebral arteries: Right dominant. Segments of predominantly fibrofatty plaque with up to moderate 50% stenosis of the V3 segment. Patent diminutive left vertebral artery. Skeleton: Moderate spondylosis of the cervical spine predominant discogenic degenerative changes. No high-grade bony spinal canal stenosis. No acute osseous abnormality is evident. Other neck: Negative. Upper chest: Negative. Review of the MIP images confirms the above findings CTA HEAD FINDINGS Anterior circulation: Calcific atherosclerosis of carotid siphons with mild right and moderate left paraclinoid ICA stenosis. Right M2 inferior division origin moderate stenosis. Severe proximal right A1 stenosis. Tandem segments of moderate to severe stenosis of left A2. mild right A2 stenosis. No large vessel occlusion, aneurysm, or vascular malformation. Posterior circulation: Multiple segments of mild-to-moderate stenosis in the bilateral P2 segments. No large vessel occlusion, aneurysm, or vascular  malformation. Venous sinuses: As permitted by contrast timing, patent. Anatomic variants: None significant. Delayed phase: No abnormal intracranial enhancement. Review of the MIP images confirms the above findings IMPRESSION: CT head: 1. Small focus of recent stroke in the left occipital lobe is similar in distribution to prior MRI given differences in technique. 2. No new acute intracranial abnormality identified. 3. Chronic right posterior MCA distribution infarct. Stable chronic microvascular ischemic changes and volume loss of the brain. CTA neck: 1. Moderate 50% stenosis of the V3 segment of the right vertebral artery. 2. Otherwise no hemodynamically significant stenosis or occlusion of the carotid and vertebral arteries of the neck. 3. Moderate spondylosis of the cervical spine predominant discogenic degenerative changes. CTA head: 1. No large vessel occlusion, aneurysm, or vascular malformation. 2. Stable advanced intracranial atherosclerosis with multiple segments of stenosis in the anterior and posterior circulation. Electronically Signed   By: Kristine Garbe M.D.   On: 06/27/2018 16:14        Scheduled Meds: . aspirin  81 mg Per Tube Daily  . carvedilol  12.5 mg Oral BID WC  . clopidogrel  75 mg Oral Daily  . famotidine  20 mg Oral BID  . gabapentin  300 mg Oral BID  . heparin injection (subcutaneous)  5,000 Units Subcutaneous Q8H  . ipratropium-albuterol  3 mL Nebulization BID  . isosorbide-hydrALAZINE  1 tablet Oral TID  . lacosamide  100 mg Oral BID  . levETIRAcetam  500 mg Oral BID  . losartan  25 mg Oral Daily  . magic mouthwash  5 mL Oral QID  . multivitamin with minerals  1 tablet Per Tube Daily  . phenytoin  125 mg Oral TID  . rosuvastatin  20 mg Per Tube q1800   Continuous Infusions:  LOS: 9 days     Cordelia Poche, MD [facility] 06/29/2018, 1:10 PM  If 7PM-7AM, please contact night-coverage [email or web address]

## 2018-06-30 ENCOUNTER — Inpatient Hospital Stay (HOSPITAL_COMMUNITY): Payer: PPO

## 2018-06-30 MED ORDER — PHENYTOIN SODIUM EXTENDED 100 MG PO CAPS
100.0000 mg | ORAL_CAPSULE | Freq: Three times a day (TID) | ORAL | Status: DC
  Filled 2018-06-30: qty 1

## 2018-06-30 MED ORDER — NYSTATIN 100000 UNIT/ML MT SUSP: 5.0000 mL | Freq: Four times a day (QID) | OROMUCOSAL | 0 refills | Status: AC

## 2018-06-30 MED ORDER — PHENOL 1.4 % MT LIQD: 1.0000 | OROMUCOSAL | Status: DC | PRN

## 2018-06-30 MED ORDER — LACOSAMIDE 100 MG PO TABS: 100.0000 mg | ORAL_TABLET | Freq: Two times a day (BID) | ORAL | 0 refills | Status: DC

## 2018-06-30 MED ORDER — NYSTATIN 100000 UNIT/ML MT SUSP: 5.0000 mL | Freq: Four times a day (QID) | OROMUCOSAL | Status: DC

## 2018-06-30 MED ORDER — CLOPIDOGREL BISULFATE 75 MG PO TABS: 75.0000 mg | ORAL_TABLET | Freq: Every day | ORAL | Status: DC

## 2018-06-30 MED ORDER — FAMOTIDINE 20 MG PO TABS: 20.0000 mg | ORAL_TABLET | Freq: Two times a day (BID) | ORAL | Status: DC

## 2018-06-30 MED ORDER — LEVETIRACETAM 500 MG PO TABS: 500.0000 mg | ORAL_TABLET | Freq: Two times a day (BID) | ORAL | Status: DC

## 2018-06-30 MED ORDER — ROSUVASTATIN CALCIUM 20 MG PO TABS: 20.0000 mg | ORAL_TABLET | Freq: Every day | ORAL | 0 refills | Status: DC

## 2018-06-30 MED ORDER — ISOSORB DINITRATE-HYDRALAZINE 20-37.5 MG PO TABS: 1.0000 | ORAL_TABLET | Freq: Three times a day (TID) | ORAL | Status: DC

## 2018-06-30 MED ORDER — ROSUVASTATIN CALCIUM 20 MG PO TABS: 20.0000 mg | ORAL_TABLET | Freq: Every day | ORAL | Status: DC

## 2018-06-30 MED ORDER — ISOSORB DINITRATE-HYDRALAZINE 20-37.5 MG PO TABS: 1.0000 | ORAL_TABLET | Freq: Three times a day (TID) | ORAL | 0 refills | Status: DC

## 2018-06-30 MED ORDER — PHENYTOIN SODIUM EXTENDED 100 MG PO CAPS: 100.0000 mg | ORAL_CAPSULE | Freq: Three times a day (TID) | ORAL | Status: DC

## 2018-06-30 MED ORDER — CARVEDILOL 12.5 MG PO TABS: 12.5000 mg | ORAL_TABLET | Freq: Two times a day (BID) | ORAL | 0 refills | Status: DC

## 2018-06-30 MED ORDER — PHENOL 1.4 % MT LIQD: 1.0000 | OROMUCOSAL | 0 refills | Status: DC | PRN

## 2018-06-30 MED ORDER — CARVEDILOL 12.5 MG PO TABS: 12.5000 mg | ORAL_TABLET | Freq: Two times a day (BID) | ORAL | Status: DC

## 2018-06-30 MED ORDER — ADULT MULTIVITAMIN W/MINERALS CH: 1.0000 | ORAL_TABLET | Freq: Every day | ORAL | Status: DC

## 2018-06-30 MED ORDER — LACOSAMIDE 100 MG PO TABS: 100.0000 mg | ORAL_TABLET | Freq: Two times a day (BID) | ORAL | Status: DC

## 2018-06-30 MED ORDER — CLOPIDOGREL BISULFATE 75 MG PO TABS: 75.0000 mg | ORAL_TABLET | Freq: Every day | ORAL | 0 refills | Status: AC

## 2018-06-30 MED ORDER — PHENYTOIN SODIUM EXTENDED 100 MG PO CAPS: 100.0000 mg | ORAL_CAPSULE | Freq: Three times a day (TID) | ORAL | 0 refills | Status: DC

## 2018-06-30 MED ORDER — LEVETIRACETAM 500 MG PO TABS: 500.0000 mg | ORAL_TABLET | Freq: Two times a day (BID) | ORAL | 0 refills | Status: DC

## 2018-06-30 NOTE — Progress Notes (Signed)
CSW spoke with fiance who reports she discussed with OT yesterday that patient was not appropriate for SNF and should do fine at home. Fiance reports she is home and could care for patient, and states they have plenty of equipment and a ramp to get inside the house. Fiance states patient's son is planning on picking him up and taking him home.   No further CSW needs at this time.  RNCM notified.   CSW signing off.   [place], [place]

## 2018-06-30 NOTE — Discharge Summary (Addendum)
Physician Discharge Summary  Jeremy [name] GQQ:[id number] DOB: 1946/02/08 DOA: 06/20/2018  PCP: Celene Squibb, MD  Admit date: 06/20/2018 Discharge date: 06/30/2018  Admitted From: Home Disposition: SNF  Recommendations for Outpatient Follow-up:  1. Follow up with PCP in 1 week 2. Follow up with Neurology as an outpatient 3. Please obtain BMP/CBC in one week 4. Speech therapy follow-up for cognition and swallow 5. Please follow up on the following pending results: None  Home Health: SNF Equipment/Devices: None  Discharge Condition: Stable CODE STATUS: Full code Diet recommendation: Heart healthy, thin liquid  Liquid Administration via: Cup;No straw  Medication Administration: Whole meds with liquid  Supervision: Patient able to self feed;Intermittent supervision to cue for compensatory strategies  Compensations: Slow rate;Small sips/bites;Clear throat intermittently  Postural Changes: Seated upright at 90 degrees  Brief/Interim Summary:  Admission HPI written by Juanito Doom, MD   History of present illness  This is a pleasant 73 year old male who came to our facility on June 20, 2018 in the setting of seizure-like episode, acute loss of consciousness followed by severe combativeness.  His family provides history because he was intubated on my arrival.  They state that he has been in his usual state of health.  Apparently had carotid artery surgery several months ago.  At that time he was noted to have some degree of cerebral artery stenosis.  He was awaiting neurology consultation but that had not happened yet.  In the interim since September when he last had his surgery he was complaining of significant headache on a regular basis.  They said that this has been happening off and on.  They denied any sort of alcohol intake, he does not use drugs, he does not smoke cigarettes.  They denied a history of diabetes.  At the [facility] emergency room he required intubation  for airway protection.  He had no seizure activity there.  He was noted to have an elevated ammonia.   Hospital course:  Seizure Clinical suspicion based on history. EEG was not significant for seizure at the time of study. Thought secondary to known history of stroke. Vimpat 100 BID, Keppra 500 mg BID, phenytoin 125 mg TID.  Acute encephalopathy Likely metabolic from seizure but unknown.  Acute respiratory failure with hypoxia Patient required mechanical ventilation from 1/29 to 2/2. Weaned to room air.  Acute/subacute occipital lobe CVA Per neuro, thought to be embolic. Transthoracic Echocardiogram and Transesophageal Echocardiogram significant for no thrombus. Increased to Crestor 20 mg. CTA head/neck performed. Neurology recommending aspirin and Plavix for three weeks followed by aspirin alone. As an outpatient, patient will need either a loop recorder or AICD depending on heart failure management. Outpatient neurology follow-up.  Essential hypertension Not controlled. Continue Coreg, losartan and Imdur.  Hyperlipidemia History of CAD Continue Crestor and aspirin  Chronic systolic heart failure EF of 35-40%. Stable. On losartan as an outpatient. Metoprolol discontinued and started on Coreg. Continue home losartan. Also started on Bidil. Outpatient cardiology follow-up.  CKD stage III Stable  History of stroke Crestor, aspirin as mentioned above  Sleeplessness Has this issue as an outpatient at times. Takes medication but is unsure of what he takes. Trazodone prn.  Hypernatremia Secondary to poor oral fluid intake. Improved with IV fluids. Diet advanced. Patient to keep well hydrated.  Hypokalemia Potassium supplemented  Oral candidiasis Nystatin suspension. Treat for 7 to 14 days based on response to treatment  Discharge Diagnoses:  Principal Problem:   Seizure [facility]) Active Problems:  HYPERCHOLESTEROLEMIA, PURE   Essential hypertension   Acute  encephalopathy   Acute respiratory failure with hypoxia (HCC)   Hypernatremia   CKD (chronic kidney disease), stage III (HCC)   Cerebral thrombosis with cerebral infarction    Discharge Instructions  Discharge Instructions    Ambulatory referral to Neurology   Complete by:  As directed    Follow up with Dr. Leonie Man at [facility] in 4-6 weeks. Too complicated for RN to follow. Thanks.     Allergies as of 06/30/2018      Reactions   Codeine Anaphylaxis   Oxycontin [oxycodone Hcl] Shortness Of Breath, Itching   Morphine And Related Itching      Medication List    STOP taking these medications   diclofenac 75 MG EC tablet Commonly known as:  VOLTAREN   enalapril 5 MG tablet Commonly known as:  VASOTEC   metoprolol tartrate 50 MG tablet Commonly known as:  LOPRESSOR   traMADol 50 MG tablet Commonly known as:  ULTRAM     TAKE these medications   aspirin EC 81 MG tablet Take 81 mg by mouth daily.   carvedilol 12.5 MG tablet Commonly known as:  COREG Take 1 tablet (12.5 mg total) by mouth 2 (two) times daily with a meal.   clobetasol cream 0.05 % Commonly known as:  TEMOVATE Apply 1 application topically as needed (on affected area on skin).   clopidogrel 75 MG tablet Commonly known as:  PLAVIX Take 1 tablet (75 mg total) by mouth daily for 21 days. Start taking on:  July 01, 2018   famotidine 20 MG tablet Commonly known as:  PEPCID Take 1 tablet (20 mg total) by mouth 2 (two) times daily.   gabapentin 300 MG capsule Commonly known as:  NEURONTIN Take 300 mg by mouth daily as needed for pain.   isosorbide-hydrALAZINE 20-37.5 MG tablet Commonly known as:  BIDIL Take 1 tablet by mouth 3 (three) times daily.   ketoconazole 2 % shampoo Commonly known as:  NIZORAL Apply 1 application topically 2 (two) times a week.   Lacosamide 100 MG Tabs Take 1 tablet (100 mg total) by mouth 2 (two) times daily.   levETIRAcetam 500 MG tablet Commonly known as:  KEPPRA Take 1  tablet (500 mg total) by mouth 2 (two) times daily.   losartan 100 MG tablet Commonly known as:  COZAAR Take 100 mg by mouth daily.   multivitamin with minerals Tabs tablet Take 1 tablet by mouth daily. Start taking on:  July 01, 2018   nitroGLYCERIN 0.4 MG SL tablet Commonly known as:  NITROSTAT Place 0.4 mg under the tongue every 5 (five) minutes as needed for chest pain.   nystatin 100000 UNIT/ML suspension Commonly known as:  MYCOSTATIN Take 5 mLs (500,000 Units total) by mouth 4 (four) times daily.   phenol 1.4 % Liqd Commonly known as:  CHLORASEPTIC Use as directed 1 spray in the mouth or throat as needed for throat irritation / pain.   phenytoin 100 MG ER capsule Commonly known as:  DILANTIN Take 1 capsule (100 mg total) by mouth 3 (three) times daily.   prochlorperazine 10 MG tablet Commonly known as:  COMPAZINE Take 1 tablet (10 mg total) by mouth 2 (two) times daily as needed (Headache).   rosuvastatin 20 MG tablet Commonly known as:  CRESTOR Take 1 tablet (20 mg total) by mouth daily at 6 PM. What changed:    medication strength  how much to take  when to  take this       Contact information for follow-up providers    Revankar, Reita Cliche, MD Follow up on 07/24/2018.   Specialty:  Cardiology Why:  10:00 am Contact information: [place]  [place] [address] [phone]        Garvin Fila, MD. Schedule an appointment as soon as possible for a visit in 4 week(s).   Specialties:  Neurology, Radiology Contact information: [address] [place] [address] [phone]            Contact information for after-discharge care    [facility] .   Service:  Skilled Nursing Contact information: [address] [place] [phone]                 Allergies  Allergen Reactions  . Codeine Anaphylaxis  . Oxycontin [Oxycodone Hcl] Shortness  Of Breath and Itching  . Morphine And Related Itching    Consultations:  Neurology  Critical care medicine  Cardiology   Procedures/Studies: Ct Abdomen Pelvis Wo Contrast  Result Date: 06/23/2018 CLINICAL DATA:  Adrenal insufficiency. EXAM: CT ABDOMEN AND PELVIS WITHOUT CONTRAST TECHNIQUE: Multidetector CT imaging of the abdomen and pelvis was performed following the standard protocol without IV contrast. COMPARISON:  08/04/2011 FINDINGS: Lower chest: Atelectasis in the lung bases. Coronary artery calcifications. Hepatobiliary: Increased density diffusely throughout the gallbladder may represent tiny stones or sludge. No wall thickening or inflammatory infiltration identified. No bile duct dilatation. No focal liver lesions. Pancreas: Unenhanced appearance is unremarkable. Spleen: Unenhanced appearance is unremarkable. Adrenals/Urinary Tract: No adrenal gland nodules. Small cysts on the right kidney. Small parapelvic cysts on the left kidney. No hydronephrosis or hydroureter. Bladder is decompressed with a Foley catheter. Stomach/Bowel: Stomach, small bowel, and colon are not abnormally distended. No wall thickening or inflammatory changes appreciated. Duodenal diverticula arising from the second portion. Appendix is normal. Vascular/Lymphatic: Aortic atherosclerosis. No enlarged abdominal or pelvic lymph nodes. Probable calcific stenosis of the distal aorta at the bifurcation. Reproductive: Prostate is unremarkable. Other: No abdominal wall hernia or abnormality. No abdominopelvic ascites. Prominent visceral adipose tissues. Musculoskeletal: Degenerative changes in the spine. No destructive bone lesions. IMPRESSION: 1. No acute process demonstrated in the abdomen or pelvis. No evidence of bowel obstruction or inflammation. 2. Increased density diffusely throughout the gallbladder may represent tiny stones or sludge. 3. Probable calcific stenosis of the distal aorta at the bifurcation. 4. No adrenal  gland nodules. Aortic Atherosclerosis (ICD10-I70.0). Electronically Signed   By: Lucienne Capers M.D.   On: 06/23/2018 02:38   Ct Angio Head W Or Wo Contrast  Result Date: 06/27/2018 CLINICAL DATA:  73 y/o  M; EXAM: CT ANGIOGRAPHY HEAD AND NECK TECHNIQUE: Multidetector CT imaging of the head and neck was performed using the standard protocol during bolus administration of intravenous contrast. Multiplanar CT image reconstructions and MIPs were obtained to evaluate the vascular anatomy. Carotid stenosis measurements (when applicable) are obtained utilizing NASCET criteria, using the distal internal carotid diameter as the denominator. CONTRAST:  66mL ISOVUE-370 IOPAMIDOL (ISOVUE-370) INJECTION 76% COMPARISON:  06/21/2018 CT head. 10/26/2017 MRI and MRA head. FINDINGS: CT HEAD FINDINGS Brain: Small focus of recent stroke in the left occipital lobe is similar in distribution of prior MRI given differences in technique. No evidence of new acute infarction, hemorrhage, hydrocephalus, extra-axial collection or mass lesion/mass effect. Chronic right posterior MCA distribution infarction. Stable chronic microvascular ischemic changes and volume loss  of the brain. Vascular: Calcific atherosclerosis of carotid siphons and vertebral arteries. Skull: Normal. Negative for fracture or focal lesion. Sinuses: Normal aeration of paranasal sinuses. Partial opacification of the right mastoid air cells. Normal aeration of the left mastoid air cells. Orbits: No acute finding. Review of the MIP images confirms the above findings CTA NECK FINDINGS Aortic arch: Bovine variant branching. Imaged portion shows no evidence of aneurysm or dissection. No significant stenosis of the major arch vessel origins. Calcific aortic atherosclerosis. Right carotid system: No evidence of dissection, stenosis (50% or greater) or occlusion. Left carotid system: No evidence of dissection, stenosis (50% or greater) or occlusion. Non stenotic calcified  plaque of the carotid bifurcation. Vertebral arteries: Right dominant. Segments of predominantly fibrofatty plaque with up to moderate 50% stenosis of the V3 segment. Patent diminutive left vertebral artery. Skeleton: Moderate spondylosis of the cervical spine predominant discogenic degenerative changes. No high-grade bony spinal canal stenosis. No acute osseous abnormality is evident. Other neck: Negative. Upper chest: Negative. Review of the MIP images confirms the above findings CTA HEAD FINDINGS Anterior circulation: Calcific atherosclerosis of carotid siphons with mild right and moderate left paraclinoid ICA stenosis. Right M2 inferior division origin moderate stenosis. Severe proximal right A1 stenosis. Tandem segments of moderate to severe stenosis of left A2. mild right A2 stenosis. No large vessel occlusion, aneurysm, or vascular malformation. Posterior circulation: Multiple segments of mild-to-moderate stenosis in the bilateral P2 segments. No large vessel occlusion, aneurysm, or vascular malformation. Venous sinuses: As permitted by contrast timing, patent. Anatomic variants: None significant. Delayed phase: No abnormal intracranial enhancement. Review of the MIP images confirms the above findings IMPRESSION: CT head: 1. Small focus of recent stroke in the left occipital lobe is similar in distribution to prior MRI given differences in technique. 2. No new acute intracranial abnormality identified. 3. Chronic right posterior MCA distribution infarct. Stable chronic microvascular ischemic changes and volume loss of the brain. CTA neck: 1. Moderate 50% stenosis of the V3 segment of the right vertebral artery. 2. Otherwise no hemodynamically significant stenosis or occlusion of the carotid and vertebral arteries of the neck. 3. Moderate spondylosis of the cervical spine predominant discogenic degenerative changes. CTA head: 1. No large vessel occlusion, aneurysm, or vascular malformation. 2. Stable advanced  intracranial atherosclerosis with multiple segments of stenosis in the anterior and posterior circulation. Electronically Signed   By: Kristine Garbe M.D.   On: 06/27/2018 16:14   Ct Head Wo Contrast  Result Date: 06/21/2018 CLINICAL DATA:  New onset seizures.  Stroke follow-up. EXAM: CT HEAD WITHOUT CONTRAST TECHNIQUE: Contiguous axial images were obtained from the base of the skull through the vertex without intravenous contrast. COMPARISON:  CT scan June 20, 2018.  MRI June 20, 2018. FINDINGS: Brain: No subdural, epidural, or subarachnoid hemorrhage. Cerebellum, brainstem, and basal cisterns are normal. The new tiny left occipital infarct could not be seen on this study. The chronic right parietal temporal infarct is stable. Ventricles and sulci are stable. No other acute interval changes identified. Vascular: Calcified atherosclerosis in the intracranial carotids. Skull: Normal. Negative for fracture or focal lesion. Sinuses/Orbits: Opacification of inferior right mastoid air cells without bony erosion. Paranasal sinuses, mastoid air cells, and middle ears are otherwise normal. Other: None. IMPRESSION: 1. The new left occipital lobe infarct is seen on the MRI from yesterday is not seen on today's CT scan. The chronic right temporoparietal infarct is stable. No other interval changes or acute abnormalities. Electronically Signed   By: Dorise Bullion  III M.D   On: 06/21/2018 21:14   Ct Head Wo Contrast  Result Date: 06/20/2018 CLINICAL DATA:  Cervical spine trauma.  Seizure EXAM: CT HEAD WITHOUT CONTRAST CT CERVICAL SPINE WITHOUT CONTRAST TECHNIQUE: Multidetector CT imaging of the head and cervical spine was performed following the standard protocol without intravenous contrast. Multiplanar CT image reconstructions of the cervical spine were also generated. COMPARISON:  CT head 10/26/2017 FINDINGS: CT HEAD FINDINGS Brain: Moderate atrophy. Mild chronic microvascular ischemic change.  Chronic infarct right temporoparietal lobe. Negative for acute infarct, hemorrhage, or mass. Vascular: Atherosclerotic calcification. Negative for hyperdense vessel Skull: Negative for fracture Sinuses/Orbits: Negative Other: None CT CERVICAL SPINE FINDINGS Alignment: Normal Skull base and vertebrae: Negative for fracture Soft tissues and spinal canal: Endotracheal tube and NG tubes in satisfactory position. No soft tissue mass or swelling Disc levels: Multilevel disc degeneration and spurring throughout the cervical spine. Upper chest: Mild atelectasis bilaterally. No significant pleural effusion Other: None IMPRESSION: 1. No acute intracranial abnormality. Chronic right temporoparietal infarct unchanged 2. Cervical spondylosis without cervical fracture. Electronically Signed   By: Franchot Gallo M.D.   On: 06/20/2018 11:08   Ct Angio Neck W Or Wo Contrast  Result Date: 06/27/2018 CLINICAL DATA:  73 y/o  M; EXAM: CT ANGIOGRAPHY HEAD AND NECK TECHNIQUE: Multidetector CT imaging of the head and neck was performed using the standard protocol during bolus administration of intravenous contrast. Multiplanar CT image reconstructions and MIPs were obtained to evaluate the vascular anatomy. Carotid stenosis measurements (when applicable) are obtained utilizing NASCET criteria, using the distal internal carotid diameter as the denominator. CONTRAST:  32mL ISOVUE-370 IOPAMIDOL (ISOVUE-370) INJECTION 76% COMPARISON:  06/21/2018 CT head. 10/26/2017 MRI and MRA head. FINDINGS: CT HEAD FINDINGS Brain: Small focus of recent stroke in the left occipital lobe is similar in distribution of prior MRI given differences in technique. No evidence of new acute infarction, hemorrhage, hydrocephalus, extra-axial collection or mass lesion/mass effect. Chronic right posterior MCA distribution infarction. Stable chronic microvascular ischemic changes and volume loss of the brain. Vascular: Calcific atherosclerosis of carotid siphons and  vertebral arteries. Skull: Normal. Negative for fracture or focal lesion. Sinuses: Normal aeration of paranasal sinuses. Partial opacification of the right mastoid air cells. Normal aeration of the left mastoid air cells. Orbits: No acute finding. Review of the MIP images confirms the above findings CTA NECK FINDINGS Aortic arch: Bovine variant branching. Imaged portion shows no evidence of aneurysm or dissection. No significant stenosis of the major arch vessel origins. Calcific aortic atherosclerosis. Right carotid system: No evidence of dissection, stenosis (50% or greater) or occlusion. Left carotid system: No evidence of dissection, stenosis (50% or greater) or occlusion. Non stenotic calcified plaque of the carotid bifurcation. Vertebral arteries: Right dominant. Segments of predominantly fibrofatty plaque with up to moderate 50% stenosis of the V3 segment. Patent diminutive left vertebral artery. Skeleton: Moderate spondylosis of the cervical spine predominant discogenic degenerative changes. No high-grade bony spinal canal stenosis. No acute osseous abnormality is evident. Other neck: Negative. Upper chest: Negative. Review of the MIP images confirms the above findings CTA HEAD FINDINGS Anterior circulation: Calcific atherosclerosis of carotid siphons with mild right and moderate left paraclinoid ICA stenosis. Right M2 inferior division origin moderate stenosis. Severe proximal right A1 stenosis. Tandem segments of moderate to severe stenosis of left A2. mild right A2 stenosis. No large vessel occlusion, aneurysm, or vascular malformation. Posterior circulation: Multiple segments of mild-to-moderate stenosis in the bilateral P2 segments. No large vessel occlusion, aneurysm, or vascular malformation.  Venous sinuses: As permitted by contrast timing, patent. Anatomic variants: None significant. Delayed phase: No abnormal intracranial enhancement. Review of the MIP images confirms the above findings IMPRESSION:  CT head: 1. Small focus of recent stroke in the left occipital lobe is similar in distribution to prior MRI given differences in technique. 2. No new acute intracranial abnormality identified. 3. Chronic right posterior MCA distribution infarct. Stable chronic microvascular ischemic changes and volume loss of the brain. CTA neck: 1. Moderate 50% stenosis of the V3 segment of the right vertebral artery. 2. Otherwise no hemodynamically significant stenosis or occlusion of the carotid and vertebral arteries of the neck. 3. Moderate spondylosis of the cervical spine predominant discogenic degenerative changes. CTA head: 1. No large vessel occlusion, aneurysm, or vascular malformation. 2. Stable advanced intracranial atherosclerosis with multiple segments of stenosis in the anterior and posterior circulation. Electronically Signed   By: Kristine Garbe M.D.   On: 06/27/2018 16:14   Ct Cervical Spine Wo Contrast  Result Date: 06/20/2018 CLINICAL DATA:  Cervical spine trauma.  Seizure EXAM: CT HEAD WITHOUT CONTRAST CT CERVICAL SPINE WITHOUT CONTRAST TECHNIQUE: Multidetector CT imaging of the head and cervical spine was performed following the standard protocol without intravenous contrast. Multiplanar CT image reconstructions of the cervical spine were also generated. COMPARISON:  CT head 10/26/2017 FINDINGS: CT HEAD FINDINGS Brain: Moderate atrophy. Mild chronic microvascular ischemic change. Chronic infarct right temporoparietal lobe. Negative for acute infarct, hemorrhage, or mass. Vascular: Atherosclerotic calcification. Negative for hyperdense vessel Skull: Negative for fracture Sinuses/Orbits: Negative Other: None CT CERVICAL SPINE FINDINGS Alignment: Normal Skull base and vertebrae: Negative for fracture Soft tissues and spinal canal: Endotracheal tube and NG tubes in satisfactory position. No soft tissue mass or swelling Disc levels: Multilevel disc degeneration and spurring throughout the cervical  spine. Upper chest: Mild atelectasis bilaterally. No significant pleural effusion Other: None IMPRESSION: 1. No acute intracranial abnormality. Chronic right temporoparietal infarct unchanged 2. Cervical spondylosis without cervical fracture. Electronically Signed   By: Franchot Gallo M.D.   On: 06/20/2018 11:08   Mr Brain Wo Contrast  Result Date: 06/20/2018 CLINICAL DATA:  73 y/o  M; seizure-like episode. EXAM: MRI HEAD WITHOUT CONTRAST TECHNIQUE: Multiplanar, multiecho pulse sequences of the brain and surrounding structures were obtained without intravenous contrast. COMPARISON:  06/20/2018 CT head.  10/26/2017 MRI head. FINDINGS: Brain: Subcentimeter focus of reduced diffusion within the left occipital lobe (series 3, image 26 and series 350, image 26) compatible with acute/early subacute infarction. No associated hemorrhage or mass effect. Chronic right temporoparietal infarction. Stable punctate nonspecific T2 FLAIR hyperintensities in subcortical and periventricular white matter are compatible with mild chronic microvascular ischemic changes. Stable mild volume loss of the brain. There is chronic hemosiderin staining of the right temporoparietal infarction. Few scattered punctate foci of susceptibility hypointensity are compatible hemosiderin deposition of chronic microhemorrhage and in a nonspecific distribution. No extra-axial collection, hydrocephalus, herniation, or acute hemorrhage identified. Vascular: Normal flow voids. Skull and upper cervical spine: Normal marrow signal. Sinuses/Orbits: Mild mucosal thickening of the paranasal sinuses, increased signal of mastoid air cells, and debris within the nasopharynx, likely due to intubation. Orbits are unremarkable. Other: None. IMPRESSION: 1. Subcentimeter focus of reduced diffusion in left occipital lobe compatible with acute/early subacute infarction. No associated hemorrhage or mass effect. 2. Stable mild chronic microvascular ischemic changes and  volume loss of the brain. Stable right temporoparietal chronic infarction. These results will be called to the ordering clinician or representative by the Radiologist Assistant, and communication documented  in the PACS or zVision Dashboard. Electronically Signed   By: Kristine Garbe M.D.   On: 06/20/2018 22:06   Dg Chest Port 1 View  Result Date: 06/25/2018 CLINICAL DATA:  Encounter for feeding tube placement EXAM: PORTABLE CHEST 1 VIEW COMPARISON:  06/22/2018 FINDINGS: Interval tracheal and esophageal extubation. Low volume chest with haziness at the bases that is likely from atelectasis. No effusion or pneumothorax. Normal heart size. Severe glenohumeral osteoarthritis on both sides. IMPRESSION: Low volume chest without acute finding. Electronically Signed   By: Monte Fantasia M.D.   On: 06/25/2018 07:54   Dg Chest Port 1 View  Result Date: 06/22/2018 CLINICAL DATA:  Intubation. EXAM: PORTABLE CHEST 1 VIEW COMPARISON:  06/21/2018. FINDINGS: Endotracheal tube and NG tube in stable position. Cardiomegaly. Persistent bibasilar atelectasis/infiltrates, left side greater right. Small left pleural effusion. No pneumothorax. IMPRESSION: 1.  Endotracheal tube and NG tube in stable position. 2. Persistent bibasilar atelectasis/infiltrates, left side greater than right. Persistent small left pleural effusion. Similar findings noted on prior exam. Electronically Signed   By: Marcello Moores  Register   On: 06/22/2018 06:32   Dg Chest Port 1 View  Result Date: 06/21/2018 CLINICAL DATA:  Shortness of breath and abdominal distension EXAM: PORTABLE CHEST 1 VIEW COMPARISON:  June 21, 2018 FINDINGS: Stable cardiomegaly. The hila and mediastinum are unremarkable. The ETT terminates in good position as does the NG tube. Low lung volumes. Haziness over the left base, likely atelectasis. No suspicious infiltrate. IMPRESSION: 1. The study is limited due to the low volume portable technique. 2. Haziness over the left  base favored to represent atelectasis. 3. Support apparatus in good position. Electronically Signed   By: Dorise Bullion III M.D   On: 06/21/2018 18:30   Dg Chest Port 1 View  Result Date: 06/21/2018 CLINICAL DATA:  Seizure activity, check endotracheal tube placement EXAM: PORTABLE CHEST 1 VIEW COMPARISON:  06/20/2018 FINDINGS: The endotracheal tube has been withdrawn and now lies 5.2 cm above the carina. Gastric catheter is noted within the stomach. The cardiac shadow is prominent but accentuated by the portable technique. The lungs are clear. No acute bony abnormality is noted. IMPRESSION: Tubes and lines as described. Improved aeration although the overall inspiratory effort remains poor. Electronically Signed   By: Inez Catalina M.D.   On: 06/21/2018 08:09   Dg Chest Portable 1 View  Result Date: 06/20/2018 CLINICAL DATA:  73 year old male with a history of fall and head injury EXAM: PORTABLE CHEST 1 VIEW COMPARISON:  09/14/2014 FINDINGS: Endotracheal tube terminates of less than 2 cm above the carina. Gastric tube projects over the mediastinum terminating out of the field of view. Low lung volumes accentuates the interstitium. Cardiomediastinal silhouette projects widened at the level of the vascular pedicle. There is double density along the left heart border, not present on the comparison. Blunting at the left costophrenic angle. No pneumothorax. IMPRESSION: Endotracheal tube terminates less than 2 cm above the carina. This may be better positioned by withdrawing to the region of the clavicular heads, approximately 5-6 cm. Low lung volumes with likely atelectasis. Left basilar effusion/consolidation not excluded. The upper mediastinum appears widened compared to the prior plain film. This may be a consequence of the low lung volumes, however, acute mediastinal or vascular abnormality can not be excluded on the plain film. These results were called by telephone at the time of interpretation on  06/20/2018 at 10:07 am to Dr. Dorie Rank. Electronically Signed   By: Corrie Mckusick D.O.  On: 06/20/2018 10:07   Dg Abd Portable 1v  Result Date: 06/24/2018 CLINICAL DATA:  NG tube placement EXAM: PORTABLE ABDOMEN - 1 VIEW COMPARISON:  CT abdomen/pelvis dated 06/23/2018 FINDINGS: Enteric tube terminates in the gastric antrum. Nonobstructive bowel gas pattern. Degenerative changes of the lumbar spine. IMPRESSION: Enteric tube terminates in the gastric antrum. Electronically Signed   By: Julian Hy M.D.   On: 06/24/2018 09:04   Dg Abd Portable 1v  Result Date: 06/21/2018 CLINICAL DATA:  Shortness of breath and abdominal distention EXAM: PORTABLE ABDOMEN - 1 VIEW COMPARISON:  None. FINDINGS: The paucity of bowel gas limits evaluation. No evidence of obstruction within this limitation. Degenerative changes in the lumbar spine. No renal stones noted. A few calcifications in the left pelvis are nonspecific but may be vascular in nature. No other acute abnormalities. IMPRESSION: 1. A paucity of bowel gas limits evaluation but there is no evidence of obstruction. 2. Calcifications in the left pelvis are nonspecific but may be vascular in nature. No renal stones noted. Electronically Signed   By: Dorise Bullion III M.D   On: 06/21/2018 18:31   Dg Abd Portable 1v  Result Date: 06/21/2018 CLINICAL DATA:  Orogastric tube placement. EXAM: PORTABLE ABDOMEN - 1 VIEW COMPARISON:  None. FINDINGS: Tip and side port of the enteric tube below the diaphragm in the stomach. Nonobstructive bowel gas pattern. Bibasilar atelectasis with small pleural effusions. IMPRESSION: Tip and side port of the enteric tube below the diaphragm in the stomach. Electronically Signed   By: Keith Rake M.D.   On: 06/21/2018 01:52   Dg Swallowing Func-speech Pathology  Result Date: 06/25/2018 Objective Swallowing Evaluation: Type of Study: MBS-Modified Barium Swallow Study  Patient Details Name: Jeremy [name] MRN: [id number] Date of  Birth: 04/10/1946 Today's Date: 06/25/2018 Time: SLP Start Time (ACUTE ONLY): 9675 -SLP Stop Time (ACUTE ONLY): 1343 SLP Time Calculation (min) (ACUTE ONLY): 20 min Past Medical History: Past Medical History: Diagnosis Date . Arthritis  . Carotid stenosis, asymptomatic, right  . Carpal tunnel syndrome   bilateral . Coronary artery disease  . GERD (gastroesophageal reflux disease)  . History of kidney stones  . Hx of colonic polyp  . Hypercholesterolemia  . Hypertension  . Myocardial infarction ([place])   hx of . Obesity  Past Surgical History: Past Surgical History: Procedure Laterality Date . CARDIAC CATHETERIZATION   . CORONARY STENT PLACEMENT  2000  x3  . ENDARTERECTOMY Right 12/25/2017  Procedure: ENDARTERECTOMY CAROTID RIGHT;  Surgeon: Rosetta Posner, MD;  Location: [facility];  Service: Vascular;  Laterality: Right; . PATCH ANGIOPLASTY Right 12/25/2017  Procedure: PATCH ANGIOPLASTY RIGHT CAROTID ARTERY;  Surgeon: Rosetta Posner, MD;  Location: [facility] OR;  Service: Vascular;  Laterality: Right; HPI: 73 year old male presenting with seizure-like episode, acute loss of consciousness followed by severe combativeness. Intubated 1/29-2/2. MRI of the brain shows subcentimeter left occipital areas of restricted diffusion. PMH including HTN, coronary artery disease, hypercholesterolemia, gastroesophageal reflux disease, and carotid artery stenosis. CXR low volume chest without acute finding.  No data recorded Assessment / Plan / Recommendation CHL IP CLINICAL IMPRESSIONS 06/25/2018 Clinical Impression Pt exhibited mild-moderate oropharyngeal dysphagia with aspiration and penetration of nectar and honey thick boluses. Oral phase marked by mildly decreased cohesion and coordination of transit with thinner boluses. Pt's ability to elevate larynx and deflect epiglottis to protect airway is adequate however timing to initiate protective mechanisms was suboptimal. Nectar barium was silently aspirated during the swallow and honey thick  substantially  penetrated despite chin tuck head position, modification of bolus size or presentation. Recommend pt initiate Dys 1 (puree) consistency and nothing thinner than puree (liquids thickened to pudding consistency), alert for po's, upright, small bites and throat clear intermittently.      SLP Visit Diagnosis Dysphagia, pharyngeal phase (R13.13) Attention and concentration deficit following -- Frontal lobe and executive function deficit following -- Impact on safety and function Moderate aspiration risk;Severe aspiration risk   CHL IP TREATMENT RECOMMENDATION 06/25/2018 Treatment Recommendations Therapy as outlined in treatment plan below   Prognosis 06/25/2018 Prognosis for Safe Diet Advancement Good Barriers to Reach Goals -- Barriers/Prognosis Comment -- CHL IP DIET RECOMMENDATION 06/25/2018 SLP Diet Recommendations Pudding thick liquid;Dysphagia 1 (Puree) solids Liquid Administration via -- Medication Administration Crushed with puree Compensations Slow rate;Small sips/bites;Clear throat intermittently Postural Changes Seated upright at 90 degrees   CHL IP OTHER RECOMMENDATIONS 06/25/2018 Recommended Consults -- Oral Care Recommendations Oral care BID Other Recommendations Order thickener from pharmacy   CHL IP FOLLOW UP RECOMMENDATIONS 06/25/2018 Follow up Recommendations Home health SLP   CHL IP FREQUENCY AND DURATION 06/25/2018 Speech Therapy Frequency (ACUTE ONLY) min 2x/week Treatment Duration 2 weeks      CHL IP ORAL PHASE 06/25/2018 Oral Phase Impaired Oral - Pudding Teaspoon -- Oral - Pudding Cup -- Oral - Honey Teaspoon Decreased bolus cohesion Oral - Honey Cup Decreased bolus cohesion Oral - Nectar Teaspoon -- Oral - Nectar Cup Decreased bolus cohesion Oral - Nectar Straw -- Oral - Thin Teaspoon -- Oral - Thin Cup -- Oral - Thin Straw -- Oral - Puree WFL Oral - Mech Soft -- Oral - Regular -- Oral - Multi-Consistency -- Oral - Pill -- Oral Phase - Comment --  CHL IP PHARYNGEAL PHASE 06/25/2018 Pharyngeal Phase  Impaired Pharyngeal- Pudding Teaspoon -- Pharyngeal -- Pharyngeal- Pudding Cup -- Pharyngeal -- Pharyngeal- Honey Teaspoon Penetration/Aspiration during swallow Pharyngeal Material enters airway, remains ABOVE vocal cords and not ejected out Pharyngeal- Honey Cup Penetration/Aspiration during swallow Pharyngeal Material enters airway, remains ABOVE vocal cords and not ejected out Pharyngeal- Nectar Teaspoon -- Pharyngeal -- Pharyngeal- Nectar Cup Penetration/Aspiration during swallow;Other (Comment) Pharyngeal Material enters airway, passes BELOW cords without attempt by patient to eject out (silent aspiration) Pharyngeal- Nectar Straw -- Pharyngeal -- Pharyngeal- Thin Teaspoon -- Pharyngeal -- Pharyngeal- Thin Cup -- Pharyngeal -- Pharyngeal- Thin Straw -- Pharyngeal -- Pharyngeal- Puree WFL Pharyngeal -- Pharyngeal- Mechanical Soft -- Pharyngeal -- Pharyngeal- Regular -- Pharyngeal -- Pharyngeal- Multi-consistency -- Pharyngeal -- Pharyngeal- Pill -- Pharyngeal -- Pharyngeal Comment --  CHL IP CERVICAL ESOPHAGEAL PHASE 06/25/2018 Cervical Esophageal Phase WFL Pudding Teaspoon -- Pudding Cup -- Honey Teaspoon -- Honey Cup -- Nectar Teaspoon -- Nectar Cup -- Nectar Straw -- Thin Teaspoon -- Thin Cup -- Thin Straw -- Puree -- Mechanical Soft -- Regular -- Multi-consistency -- Pill -- Cervical Esophageal Comment -- [place] Siren 06/25/2018, 2:56 PM Orbie Pyo Litaker M.Ed Actor Pager [phone] Office [phone]              Ct Angio Chest Aorta W And/or Wo Contrast  Result Date: 06/20/2018 CLINICAL DATA:  Chest pain.  Altered mental status.  Seizure. EXAM: CT ANGIOGRAPHY CHEST WITH CONTRAST TECHNIQUE: Multidetector CT imaging of the chest was performed using the standard protocol during bolus administration of intravenous contrast. Multiplanar CT image reconstructions and MIPs were obtained to evaluate the vascular anatomy. CONTRAST:  167mL ISOVUE-370 IOPAMIDOL (ISOVUE-370) INJECTION  76% COMPARISON:  Chest x-ray dated 06/20/2018 FINDINGS: Cardiovascular: Endotracheal  tube 3.2 cm above the carina. NG tube tip in the fundus of the stomach. Aortic atherosclerosis. Heart size is normal. No pericardial effusion. No pulmonary emboli. Mediastinum/Nodes: No enlarged mediastinal, hilar, or axillary lymph nodes. Thyroid gland, trachea, and esophagus demonstrate no significant findings. Lungs/Pleura: There is slight atelectasis at both lung bases posterior medially. No effusions. Upper Abdomen: No significant abnormalities. Musculoskeletal: No chest wall abnormality. No acute or significant osseous findings. Review of the MIP images confirms the above findings. IMPRESSION: 1. No pulmonary emboli. 2. Bibasilar atelectasis. 3.  Aortic Atherosclerosis (ICD10-I70.0). Electronically Signed   By: Lorriane Shire M.D.   On: 06/20/2018 11:09   US Abdomen Limited Ruq  Result Date: 06/21/2018 CLINICAL DATA:  Increased ammonia level EXAM: ULTRASOUND ABDOMEN LIMITED RIGHT UPPER QUADRANT COMPARISON:  10/05/2017 FINDINGS: Gallbladder: No gallstones or wall thickening visualized. No sonographic Murphy sign noted by sonographer. Common bile duct: Limited coverage.  Diameter: 5 mm where where seen. Liver: Echogenic liver consistent with steatosis. No overt signs of cirrhosis. No evident mass. Portal vein is patent on color Doppler imaging with normal direction of blood flow towards the liver. IMPRESSION: Hepatic steatosis. Electronically Signed   By: Monte Fantasia M.D.   On: 06/21/2018 05:46    Transthoracic Echocardiogram (06/22/2018) IMPRESSIONS    1. The left ventricle has moderately reduced systolic function of 97-98%. The cavity size is normal. There is severe left ventricular wall thickness. Echo evidence of pseudonormal diastolic filling patterns.  2. Normal left atrial size.  3. Normal right atrial size.  4. The mitral valve normal in structure. Regurgitation is mild by color flow Doppler.  5.  Normal tricuspid valve.  6. Tricuspid regurgitation is mild.  7. The aortic valve tricuspid. There is mild thickening of the aortic valve.  8. No atrial level shunt detected by color flow Doppler.  Transesophageal Echocardiogram (06/29/2018) IMPRESSIONS    1. The right ventricle has normal systolic function. The cavity was normal.  2. The mitral valve is normal in structure.  3. The tricuspid valve was normal in structure.  4. The aortic valve is normal in structure. Aortic valve regurgitation is trivial by color flow Doppler.  5. The pulmonic valve was normal in structure.  6. The interatrial septum appears to be lipomatous.  7. The left ventricle has mildly reduced systolic function of 92-11%.   Subjective: Some throat pain.  Discharge Exam: Vitals:   06/30/18 0742 06/30/18 0840  BP:  (!) 178/72  Pulse:  75  Resp:    Temp:    SpO2: 95%    Vitals:   06/29/18 2143 06/30/18 0555 06/30/18 0742 06/30/18 0840  BP: 135/61 (!) 158/61  (!) 178/72  Pulse: 75 70  75  Resp:  16    Temp: 98.7 F (37.1 C) 98.2 F (36.8 C)    TempSrc: Oral Oral    SpO2: 93% 95% 95%   Weight:      Height:        General: Pt is alert, awake, not in acute distress Mouth: yellow plaque appears improved Cardiovascular: RRR, S1/S2 +, no rubs, no gallops Respiratory: CTA bilaterally, no wheezing, no rhonchi Abdominal: Soft, NT, ND, bowel sounds + Extremities: no edema, no cyanosis    The results of significant diagnostics from this hospitalization (including imaging, microbiology, ancillary and laboratory) are listed below for reference.     Microbiology: Recent Results (from the past 240 hour(s))  CSF culture     Status: None   Collection Time: 06/20/18  1:23 PM  Result Value Ref Range Status   Specimen Description CSF  Final   Special Requests NONE  Final   Gram Stain   Final    NO ORGANISMS SEEN WBC PRESENT, PREDOMINANTLY MONONUCLEAR CYTOSPIN SMEAR Gram Stain Report Called to,Read  Back By and Verified With: MINTER R. AT 1450 ON [id number] BY THOMPSON S. Performed at [facility], [address] [place], [place] [address]    Culture NO GROWTH  Final   Report Status 06/23/2018 FINAL  Final  MRSA PCR Screening     Status: None   Collection Time: 06/20/18  4:00 PM  Result Value Ref Range Status   MRSA by PCR NEGATIVE NEGATIVE Final    Comment:        The GeneXpert MRSA Assay (FDA approved for NASAL specimens only), is one component of a comprehensive MRSA colonization surveillance program. It is not intended to diagnose MRSA infection nor to guide or monitor treatment for MRSA infections. Performed at [facility], [place] [address], [place],  [address]   Culture, blood (Routine X 2) w Reflex to ID Panel     Status: None   Collection Time: 06/22/18  3:05 PM  Result Value Ref Range Status   Specimen Description BLOOD RIGHT HAND  Final   Special Requests   Final    BOTTLES DRAWN AEROBIC ONLY Blood Culture adequate volume   Culture   Final    NO GROWTH 5 DAYS Performed at [facility], [place] [address], [place], [place] [address]    Report Status 06/27/2018 FINAL  Final  Culture, blood (Routine X 2) w Reflex to ID Panel     Status: None   Collection Time: 06/22/18  3:33 PM  Result Value Ref Range Status   Specimen Description BLOOD RIGHT ARM  Final   Special Requests   Final    BOTTLES DRAWN AEROBIC ONLY Blood Culture adequate volume   Culture   Final    NO GROWTH 5 DAYS Performed at [facility], [place] [address], [place], [place] [address]    Report Status 06/27/2018 FINAL  Final     Labs: BNP (last 3 results) No results for input(s): BNP in the last 8760 hours. Basic Metabolic Panel: Recent Labs  Lab 06/23/18 1623 06/24/18 0519 06/24/18 1617  06/25/18 1115 06/26/18 0441 06/27/18 0438 06/27/18 1808 06/28/18 0349  NA  --   --   --    < > 146* 146* 148* 147* 145  K  --   --   --    < > 3.2* 3.1* 3.4* 4.0 3.8  CL  --   --    --   --  105 108 112* 110 109  CO2  --   --   --   --  27 26 30 30 25   GLUCOSE  --   --   --   --  103* 125* 96 111* 95  BUN  --   --   --   --  32* 42* 31* 32* 28*  CREATININE  --   --   --   --  1.14 1.26* 1.24 1.20 1.21  CALCIUM  --   --   --   --  9.2 9.0 9.1 9.3 8.8*  MG 2.1 2.1 1.9  --  2.1 2.3  --   --   --   PHOS 3.2  --   --   --  2.7 2.2*  --   --   --    < > =  values in this interval not displayed.   Liver Function Tests: Recent Labs  Lab 06/27/18 0438  ALBUMIN 3.0*   No results for input(s): LIPASE, AMYLASE in the last 168 hours. Recent Labs  Lab 06/27/18 0438  AMMONIA 18   CBC: Recent Labs  Lab 06/24/18 2035 06/25/18 1115 06/26/18 0441 06/27/18 0438  WBC  --  9.8 9.8 7.6  HGB 13.6 15.1 14.2 13.9  HCT 40.0 45.7 43.6 43.9  MCV  --  91.8 92.2 94.2  PLT  --  227 211 224   Cardiac Enzymes: No results for input(s): CKTOTAL, CKMB, CKMBINDEX, TROPONINI in the last 168 hours. BNP: Invalid input(s): POCBNP CBG: Recent Labs  Lab 06/24/18 2322 06/25/18 0319 06/25/18 0808 06/25/18 1219 06/25/18 1559  GLUCAP 100* 97 101* 101* 111*   D-Dimer No results for input(s): DDIMER in the last 72 hours. Hgb A1c No results for input(s): HGBA1C in the last 72 hours. Lipid Profile No results for input(s): CHOL, HDL, LDLCALC, TRIG, CHOLHDL, LDLDIRECT in the last 72 hours. Thyroid function studies No results for input(s): TSH, T4TOTAL, T3FREE, THYROIDAB in the last 72 hours.  Invalid input(s): FREET3 Anemia work up No results for input(s): VITAMINB12, FOLATE, FERRITIN, TIBC, IRON, RETICCTPCT in the last 72 hours. Urinalysis    Component Value Date/Time   COLORURINE YELLOW 06/20/2018 0958   APPEARANCEUR HAZY (A) 06/20/2018 0958   LABSPEC 1.017 06/20/2018 0958   PHURINE 5.0 06/20/2018 0958   GLUCOSEU NEGATIVE 06/20/2018 0958   HGBUR SMALL (A) 06/20/2018 0958   HGBUR negative 12/25/2007 0845   BILIRUBINUR NEGATIVE 06/20/2018 0958   KETONESUR NEGATIVE 06/20/2018  0958   PROTEINUR 100 (A) 06/20/2018 0958   UROBILINOGEN 0.2 12/25/2007 0845   NITRITE NEGATIVE 06/20/2018 0958   LEUKOCYTESUR NEGATIVE 06/20/2018 0958   Sepsis Labs Invalid input(s): PROCALCITONIN,  WBC,  LACTICIDVEN Microbiology Recent Results (from the past 240 hour(s))  CSF culture     Status: None   Collection Time: 06/20/18  1:23 PM  Result Value Ref Range Status   Specimen Description CSF  Final   Special Requests NONE  Final   Gram Stain   Final    NO ORGANISMS SEEN WBC PRESENT, PREDOMINANTLY MONONUCLEAR CYTOSPIN SMEAR Gram Stain Report Called to,Read Back By and Verified With: MINTER R. AT 1450 ON [id number] BY THOMPSON S. Performed at [facility], [address] [place], [place] [address]    Culture NO GROWTH  Final   Report Status 06/23/2018 FINAL  Final  MRSA PCR Screening     Status: None   Collection Time: 06/20/18  4:00 PM  Result Value Ref Range Status   MRSA by PCR NEGATIVE NEGATIVE Final    Comment:        The GeneXpert MRSA Assay (FDA approved for NASAL specimens only), is one component of a comprehensive MRSA colonization surveillance program. It is not intended to diagnose MRSA infection nor to guide or monitor treatment for MRSA infections. Performed at [facility], [place] [address] [place], [place] [address]   Culture, blood (Routine X 2) w Reflex to ID Panel     Status: None   Collection Time: 06/22/18  3:05 PM  Result Value Ref Range Status   Specimen Description BLOOD RIGHT HAND  Final   Special Requests   Final    BOTTLES DRAWN AEROBIC ONLY Blood Culture adequate volume   Culture   Final    NO GROWTH 5 DAYS Performed at [facility], [place]  [address] [place], [place] [address]    Report Status 06/27/2018 FINAL  Final  Culture, blood (Routine X 2) w Reflex to ID Panel     Status: None   Collection Time: 06/22/18  3:33 PM  Result Value Ref Range Status   Specimen Description BLOOD RIGHT ARM  Final   Special Requests   Final     BOTTLES DRAWN AEROBIC ONLY Blood Culture adequate volume   Culture   Final    NO GROWTH 5 DAYS Performed at [facility], [place] [address], [place], [place] [address]    Report Status 06/27/2018 FINAL  Final     SIGNED:   Cordelia Poche, MD [facility] 06/30/2018, 12:52 PM

## 2018-06-30 NOTE — Discharge Instructions (Signed)
Jeremy [name],  You were here because of concern for seizure and found to also have a stroke. Your medications have been adjusted. You have also been found to have thrush and have a prescription for treatment. Please follow-up with the neurologist.  Per [facility] statutes, patients with seizures are not allowed to drive until  they have been seizure-free for six months. Use caution when using heavy equipment or power tools. Avoid working on ladders or at heights. Take showers instead of baths. Ensure the water temperature is not too high on the home water heater. Do not go swimming alone. When caring for infants or small children, sit down when holding, feeding, or changing them to minimize risk of injury to the child in the event you have a seizure.    Also, Maintain good sleep hygiene. Avoid alcohol.   --> Call 911 and bring the patient back to the ED if:               A.  The seizure lasts longer than 5 minutes.                  B.  The patient doesn't awaken shortly after the seizure             C.  The patient has new problems such as difficulty seeing, speaking or moving             D.  The patient was injured during the seizure             E.  The patient has a temperature over 102 F (39C)             F.  The patient vomited and now is having trouble breathing

## 2018-06-30 NOTE — Care Management (Signed)
Spoke to patient, he states that he plans on working from the directions that PT left with him after he gets home and he declines [place] [place] services. He states that he has RW and a WC at home and declines further DME. Instructed him to contact his PCP if he determines that he needs [facility] after he goes home. He verbalized understanding.

## 2018-06-30 NOTE — Progress Notes (Signed)
Modified Barium Swallow Progress Note  Patient Details  Name: Jeremy [name] MRN: [id number] Date of Birth: 07/25/1945  Today's Date: 06/30/2018  Modified Barium Swallow completed.  Full report located under Chart Review in the Imaging Section.  Brief recommendations include the following:  Clinical Impression  Pt has a mild pharyngeal dysphagia that is likely near baseline, showing significant improvements since most recent MBS. He consistently triggers a swallow at the pyrifrom sinuses with thin and nectar thick liquids, with thin liquids consequently entering the laryngeal vestibule before the swallow. Regardless of volume or rate, penetration is trace and shallow with cup sips, clearing upon completion of the swallow or with subsequent swallows. Straw sips do not clear as quickly or as consistently, with penetrates resting on the true vocal folds. Solids leave no residuals behind and do not enter the airway. Recommend advancing diet to regular textures and thin liquids via cup (no straw). SLP will follow for tolerance.    Swallow Evaluation Recommendations       SLP Diet Recommendations: Regular solids;Thin liquid   Liquid Administration via: Cup;No straw   Medication Administration: Whole meds with liquid   Supervision: Patient able to self feed;Intermittent supervision to cue for compensatory strategies   Compensations: Slow rate;Small sips/bites;Clear throat intermittently   Postural Changes: Seated upright at 90 degrees   Oral Care Recommendations: Oral care BID        Talbert Nan 06/30/2018,11:06 AM   Nuala Alpha, M.A. [place] Acute Environmental education officer [phone] Office [phone]

## 2018-06-30 NOTE — Progress Notes (Signed)
MEDICATION RELATED CONSULT NOTE   Pharmacy Consult:  Dilantin Indication:  Seizure  Allergies  Allergen Reactions  . Codeine Anaphylaxis  . Oxycontin [Oxycodone Hcl] Shortness Of Breath and Itching  . Morphine And Related Itching    Patient Measurements: Height: 5\' 8"  (172.7 cm) Weight: 208 lb 6.4 oz (94.5 kg) IBW/kg (Calculated) : 68.4  Vital Signs: Temp: 98.2 F (36.8 C) (02/08 0555) Temp Source: Oral (02/08 0555) BP: 178/72 (02/08 0840) Pulse Rate: 75 (02/08 0840) Intake/Output from previous day: No intake/output data recorded. Intake/Output from this shift: No intake/output data recorded.  Labs: Recent Labs    06/27/18 1808 06/28/18 0349  CREATININE 1.20 1.21   Estimated Creatinine Clearance: 61.5 mL/min (by C-G formula based on SCr of 1.21 mg/dL).   Microbiology: Recent Results (from the past 720 hour(s))  CSF culture     Status: None   Collection Time: 06/20/18  1:23 PM  Result Value Ref Range Status   Specimen Description CSF  Final   Special Requests NONE  Final   Gram Stain   Final    NO ORGANISMS SEEN WBC PRESENT, PREDOMINANTLY MONONUCLEAR CYTOSPIN SMEAR Gram Stain Report Called to,Read Back By and Verified With: MINTER R. AT 1450 ON [id number] BY THOMPSON S. Performed at [facility], [address] [place], [place] [address]    Culture NO GROWTH  Final   Report Status 06/23/2018 FINAL  Final  MRSA PCR Screening     Status: None   Collection Time: 06/20/18  4:00 PM  Result Value Ref Range Status   MRSA by PCR NEGATIVE NEGATIVE Final    Comment:        The GeneXpert MRSA Assay (FDA approved for NASAL specimens only), is one component of a comprehensive MRSA colonization surveillance program. It is not intended to diagnose MRSA infection nor to guide or monitor treatment for MRSA infections. Performed at [facility], [place] [address], [place], [place] [address]   Culture, blood (Routine X 2) w Reflex to ID Panel     Status: None   Collection Time: 06/22/18  3:05 PM  Result Value Ref Range Status   Specimen Description BLOOD RIGHT HAND  Final   Special Requests   Final    BOTTLES DRAWN AEROBIC ONLY Blood Culture adequate volume   Culture   Final    NO GROWTH 5 DAYS Performed at [facility], [place] [address] [place], [place] [address]    Report Status 06/27/2018 FINAL  Final  Culture, blood (Routine X 2) w Reflex to ID Panel     Status: None   Collection Time: 06/22/18  3:33 PM  Result Value Ref Range Status   Specimen Description BLOOD RIGHT ARM  Final   Special Requests   Final    BOTTLES DRAWN AEROBIC ONLY Blood Culture adequate volume   Culture   Final    NO GROWTH 5 DAYS Performed at [facility], [place] [address], [place],  [address]    Report Status 06/27/2018 FINAL  Final     Assessment: 58 YOM presented with new-onset seizure and started on Keppra, Vimpat and Dilantin.  He is now s/p barium swallow and tolerating capsules.     Goal of Therapy:  DPH level 10-20 mcg/mL  Plan:  -Change phenytoin to 100mg  po tid -Recheck phenytoin level in 5-7 days  Hildred Laser, PharmD Clinical Pharmacist **Pharmacist phone directory can now be found on [email or web address] ([email or web address]).  Listed under [place].

## 2018-07-02 ENCOUNTER — Other Ambulatory Visit: Payer: Self-pay

## 2018-07-03 ENCOUNTER — Encounter (HOSPITAL_COMMUNITY): Payer: Self-pay | Admitting: Internal Medicine

## 2018-07-10 DIAGNOSIS — N401 Enlarged prostate with lower urinary tract symptoms: Secondary | ICD-10-CM | POA: Diagnosis not present

## 2018-07-10 DIAGNOSIS — E876 Hypokalemia: Secondary | ICD-10-CM | POA: Diagnosis not present

## 2018-07-10 DIAGNOSIS — M545 Low back pain: Secondary | ICD-10-CM | POA: Diagnosis not present

## 2018-07-10 DIAGNOSIS — E782 Mixed hyperlipidemia: Secondary | ICD-10-CM | POA: Diagnosis not present

## 2018-07-10 DIAGNOSIS — N529 Male erectile dysfunction, unspecified: Secondary | ICD-10-CM | POA: Diagnosis not present

## 2018-07-10 DIAGNOSIS — E1122 Type 2 diabetes mellitus with diabetic chronic kidney disease: Secondary | ICD-10-CM | POA: Diagnosis not present

## 2018-07-10 DIAGNOSIS — M5440 Lumbago with sciatica, unspecified side: Secondary | ICD-10-CM | POA: Diagnosis not present

## 2018-07-10 DIAGNOSIS — Z8673 Personal history of transient ischemic attack (TIA), and cerebral infarction without residual deficits: Secondary | ICD-10-CM | POA: Diagnosis not present

## 2018-07-10 DIAGNOSIS — I259 Chronic ischemic heart disease, unspecified: Secondary | ICD-10-CM | POA: Diagnosis not present

## 2018-07-10 DIAGNOSIS — R1012 Left upper quadrant pain: Secondary | ICD-10-CM | POA: Diagnosis not present

## 2018-07-10 DIAGNOSIS — N183 Chronic kidney disease, stage 3 (moderate): Secondary | ICD-10-CM | POA: Diagnosis not present

## 2018-07-10 DIAGNOSIS — I10 Essential (primary) hypertension: Secondary | ICD-10-CM | POA: Diagnosis not present

## 2018-07-10 DIAGNOSIS — N400 Enlarged prostate without lower urinary tract symptoms: Secondary | ICD-10-CM | POA: Diagnosis not present

## 2018-07-10 DIAGNOSIS — G589 Mononeuropathy, unspecified: Secondary | ICD-10-CM | POA: Diagnosis not present

## 2018-07-11 DIAGNOSIS — E782 Mixed hyperlipidemia: Secondary | ICD-10-CM | POA: Diagnosis not present

## 2018-07-11 DIAGNOSIS — N183 Chronic kidney disease, stage 3 (moderate): Secondary | ICD-10-CM | POA: Diagnosis not present

## 2018-07-11 DIAGNOSIS — E1122 Type 2 diabetes mellitus with diabetic chronic kidney disease: Secondary | ICD-10-CM | POA: Diagnosis not present

## 2018-07-11 DIAGNOSIS — I2510 Atherosclerotic heart disease of native coronary artery without angina pectoris: Secondary | ICD-10-CM | POA: Diagnosis not present

## 2018-07-11 DIAGNOSIS — I10 Essential (primary) hypertension: Secondary | ICD-10-CM | POA: Diagnosis not present

## 2018-07-20 ENCOUNTER — Other Ambulatory Visit: Payer: Self-pay

## 2018-07-20 DIAGNOSIS — I6521 Occlusion and stenosis of right carotid artery: Secondary | ICD-10-CM

## 2018-07-24 ENCOUNTER — Ambulatory Visit (INDEPENDENT_AMBULATORY_CARE_PROVIDER_SITE_OTHER): Payer: PPO | Admitting: Cardiology

## 2018-07-24 ENCOUNTER — Encounter: Payer: Self-pay | Admitting: Vascular Surgery

## 2018-07-24 ENCOUNTER — Ambulatory Visit (HOSPITAL_COMMUNITY)
Admission: RE | Admit: 2018-07-24 | Discharge: 2018-07-24 | Disposition: A | Discharge status: Home / Self Care | Payer: PPO | Source: Ambulatory Visit | Attending: Vascular Surgery | Admitting: Vascular Surgery

## 2018-07-24 ENCOUNTER — Other Ambulatory Visit: Payer: Self-pay

## 2018-07-24 ENCOUNTER — Ambulatory Visit (INDEPENDENT_AMBULATORY_CARE_PROVIDER_SITE_OTHER): Payer: PPO | Admitting: Vascular Surgery

## 2018-07-24 ENCOUNTER — Ambulatory Visit: Payer: PPO | Admitting: Cardiology

## 2018-07-24 ENCOUNTER — Encounter: Payer: Self-pay | Admitting: Cardiology

## 2018-07-24 VITALS — BP 141/79 | HR 60 | Temp 97.5°F | Resp 20 | Ht 68.0 in | Wt 216.1 lb

## 2018-07-24 VITALS — BP 128/72 | HR 67 | Ht 68.0 in | Wt 217.0 lb

## 2018-07-24 DIAGNOSIS — E782 Mixed hyperlipidemia: Secondary | ICD-10-CM | POA: Diagnosis not present

## 2018-07-24 DIAGNOSIS — I6521 Occlusion and stenosis of right carotid artery: Secondary | ICD-10-CM

## 2018-07-24 DIAGNOSIS — Z8673 Personal history of transient ischemic attack (TIA), and cerebral infarction without residual deficits: Secondary | ICD-10-CM | POA: Diagnosis not present

## 2018-07-24 DIAGNOSIS — I10 Essential (primary) hypertension: Secondary | ICD-10-CM | POA: Diagnosis not present

## 2018-07-24 DIAGNOSIS — I2510 Atherosclerotic heart disease of native coronary artery without angina pectoris: Secondary | ICD-10-CM | POA: Diagnosis not present

## 2018-07-24 NOTE — Patient Instructions (Signed)
Medication Instructions:  Your physician recommends that you continue on your current medications as directed. Please refer to the Current Medication list given to you today.  If you need a refill on your cardiac medications before your next appointment, please call your pharmacy.   Lab work: None  If you have labs (blood work) drawn today and your tests are completely normal, you will receive your results only by: . MyChart Message (if you have MyChart) OR . A paper copy in the mail If you have any lab test that is abnormal or we need to change your treatment, we will call you to review the results.  Testing/Procedures: None  Follow-Up: At [facility], you and your health needs are our priority.  As part of our continuing mission to provide you with exceptional heart care, we have created designated Provider Care Teams.  These Care Teams include your primary Cardiologist (physician) and Advanced Practice Providers (APPs -  Physician Assistants and Nurse Practitioners) who all work together to provide you with the care you need, when you need it. You will need a follow up appointment in 6 months.  Please call our office 2 months in advance to schedule this appointment.  You may see Rajan R Revankar, MD or another member of our [facility] Provider Team in [place]: Robert Krasowski, MD . Brian Munley, MD  Any Other Special Instructions Will Be Listed Below (If Applicable).    

## 2018-07-24 NOTE — Progress Notes (Signed)
Cardiology Office Note:    Date:  07/24/2018   ID:  Jeremy [name], DOB Jan 29, 1946, MRN [id number]  PCP:  Celene Squibb, MD  Cardiologist:  Jenean Lindau, MD   Referring MD: Celene Squibb, MD    ASSESSMENT:    1. Coronary artery disease involving native coronary artery of native heart without angina pectoris   2. Essential hypertension   3. Mixed dyslipidemia   4. History of stroke    PLAN:    In order of problems listed above:  1. Hospital records were reviewed extensively and I discussed this with the patient 2. Secondary prevention stressed with the patient.  Importance of compliance with diet and medication stressed and he vocalized understanding.  His blood pressure is stable. 3. Diet was discussed for dyslipidemia.  Weight reduction was stressed the risks of obesity explained.  He will be having blood work done by his primary care physician including fasting lipids in a month I asked him to make sure they are forwarded to me. 4. Patient will be seen in follow-up appointment in 6 months or earlier if the patient has any concerns    Medication Adjustments/Labs and Tests Ordered: Current medicines are reviewed at length with the patient today.  Concerns regarding medicines are outlined above.  No orders of the defined types were placed in this encounter.  No orders of the defined types were placed in this encounter.    No chief complaint on file.    History of Present Illness:    Jeremy [name] is a 73 y.o. male.  Patient has known coronary artery disease.  He recently went to the hospital with stroke and was treated and released.  Fortunately he has not had much in terms of disabilities or residual effects from the stroke.  No chest pain orthopnea or PND.  He is very cheerful.  At the time of my evaluation, the patient is alert awake oriented and in no distress.  Past Medical History:  Diagnosis Date  . Arthritis   . Carotid stenosis, asymptomatic, right   . Carpal  tunnel syndrome    bilateral  . Coronary artery disease   . GERD (gastroesophageal reflux disease)   . History of kidney stones   . Hx of colonic polyp   . Hypercholesterolemia   . Hypertension   . Myocardial infarction ([place])    hx of  . Obesity   . Stroke [facility])     Past Surgical History:  Procedure Laterality Date  . CARDIAC CATHETERIZATION    . CORONARY STENT PLACEMENT  2000   x3   . ENDARTERECTOMY Right 12/25/2017   Procedure: ENDARTERECTOMY CAROTID RIGHT;  Surgeon: Rosetta Posner, MD;  Location: [facility];  Service: Vascular;  Laterality: Right;  . PATCH ANGIOPLASTY Right 12/25/2017   Procedure: PATCH ANGIOPLASTY RIGHT CAROTID ARTERY;  Surgeon: Rosetta Posner, MD;  Location: [facility];  Service: Vascular;  Laterality: Right;  . TEE WITHOUT CARDIOVERSION N/A 06/29/2018   Procedure: TRANSESOPHAGEAL ECHOCARDIOGRAM (TEE);  Surgeon: Fay Records, MD;  [facility] ENDOSCOPY;  Service: Cardiovascular;  Laterality: N/A;    Current Medications: Current Meds  Medication Sig  . aspirin EC 81 MG tablet Take 81 mg by mouth daily.  . carvedilol (COREG) 12.5 MG tablet Take 1 tablet (12.5 mg total) by mouth 2 (two) times daily with a meal.  . gabapentin (NEURONTIN) 300 MG capsule Take 300 mg by mouth daily as needed for pain.  Marland Kitchen ketoconazole (NIZORAL)  2 % shampoo Apply 1 application topically 2 (two) times a week.   . Lacosamide 100 MG TABS Take 1 tablet (100 mg total) by mouth 2 (two) times daily.  Marland Kitchen losartan (COZAAR) 100 MG tablet Take 100 mg by mouth daily.   . nitroGLYCERIN (NITROSTAT) 0.4 MG SL tablet Place 0.4 mg under the tongue every 5 (five) minutes as needed for chest pain.   Marland Kitchen prochlorperazine (COMPAZINE) 10 MG tablet Take 1 tablet (10 mg total) by mouth 2 (two) times daily as needed (Headache).  . rosuvastatin (CRESTOR) 20 MG tablet Take 1 tablet (20 mg total) by mouth daily at 6 PM.     Allergies:   Codeine; Oxycontin [oxycodone hcl]; and Morphine and related   Social History    Socioeconomic History  . Marital status: Divorced    Spouse name: Not on file  . Number of children: Not on file  . Years of education: Not on file  . Highest education level: Not on file  Occupational History  . Occupation: retired    Comment: Building control surveyor  Social Needs  . Financial resource strain: Not on file  . Food insecurity:    Worry: Not on file    Inability: Not on file  . Transportation needs:    Medical: Not on file    Non-medical: Not on file  Tobacco Use  . Smoking status: Former Smoker    Last attempt to quit: 05/23/1990    Years since quitting: 28.1  . Smokeless tobacco: Never Used  . Tobacco comment: smoked 2 packs per day for 20 years  Substance and Sexual Activity  . Alcohol use: No  . Drug use: No  . Sexual activity: Not on file  Lifestyle  . Physical activity:    Days per week: Not on file    Minutes per session: Not on file  . Stress: Not on file  Relationships  . Social connections:    Talks on phone: Not on file    Gets together: Not on file    Attends religious service: Not on file    Active member of club or organization: Not on file    Attends meetings of clubs or organizations: Not on file    Relationship status: Not on file  Other Topics Concern  . Not on file  Social History Narrative  . Not on file     Family History: The patient's family history includes Arthritis in an other family member; Asthma in an other family member; Cancer in an other family member; Coronary artery disease in his brother and another family member; Coronary artery disease (age of onset: 51) in his mother; Diabetes in an other family member; Hypertension in his mother; Other (age of onset: 69) in his father; Other (age of onset: 62) in his sister; Skin cancer in his brother.  ROS:   Please see the history of present illness.    All other systems reviewed and are negative.  EKGs/Labs/Other Studies Reviewed:    The following studies were reviewed today: I  discussed my findings with the patient at extensive length   Recent Labs: 06/20/2018: ALT 55 06/22/2018: TSH 0.634 06/26/2018: Magnesium 2.3 06/27/2018: Hemoglobin 13.9; Platelets 224 06/28/2018: BUN 28; Creatinine, Ser 1.21; Potassium 3.8; Sodium 145  Recent Lipid Panel    Component Value Date/Time   CHOL 195 06/23/2018 0626   TRIG 295 (H) 06/23/2018 1623   HDL 40 (L) 06/23/2018 0626   CHOLHDL 4.9 06/23/2018 0626   VLDL 42 (H)  06/23/2018 0626   LDLCALC 113 (H) 06/23/2018 4715    Physical Exam:    VS:  BP 128/72 (BP Location: Right Arm, Patient Position: Sitting, Cuff Size: Normal)   Pulse 67   Ht 5\' 8"  (1.727 m)   Wt 217 lb (98.4 kg)   SpO2 96%   BMI 32.99 kg/m     Wt Readings from Last 3 Encounters:  07/24/18 217 lb (98.4 kg)  07/24/18 216 lb 0.8 oz (98 kg)  06/29/18 208 lb 6.4 oz (94.5 kg)     GEN: Patient is in no acute distress HEENT: Normal NECK: No JVD; No carotid bruits LYMPHATICS: No lymphadenopathy CARDIAC: Hear sounds regular, 2/6 systolic murmur at the apex. RESPIRATORY:  Clear to auscultation without rales, wheezing or rhonchi  ABDOMEN: Soft, non-tender, non-distended MUSCULOSKELETAL:  No edema; No deformity  SKIN: Warm and dry NEUROLOGIC:  Alert and oriented x 3 PSYCHIATRIC:  Normal affect   Signed, Jenean Lindau, MD  07/24/2018 1:19 PM    [facility]

## 2018-07-24 NOTE — Progress Notes (Signed)
Vascular and Vein Specialist of [place]  Patient name: Jeremy [name] MRN: [id number] DOB: [birth year] Sex: male  REASON FOR VISIT: Follow-up right carotid endarterectomy  HPI: Jeremy [name] is a 73 y.o. male here today for follow-up.  He had undergone a uneventful right carotid endarterectomy for severe asymptomatic disease in August 2019.  In January 2020 he presented with what initially was felt to be a seizure.  He was intubated and was found to have a stroke.  This appeared to have been occipital stroke.  He had that work-up to include CTA at that time which revealed widely patent endarterectomy and no evidence of stenosis in his left carotid.  Work-up included transesophageal echo for cardiogenic source with no evidence found.  He is continued to improve from a rehab standpoint.  He is here today with his wife.  Past Medical History:  Diagnosis Date  . Arthritis   . Carotid stenosis, asymptomatic, right   . Carpal tunnel syndrome    bilateral  . Coronary artery disease   . GERD (gastroesophageal reflux disease)   . History of kidney stones   . Hx of colonic polyp   . Hypercholesterolemia   . Hypertension   . Myocardial infarction ([place])    hx of  . Obesity   . Stroke [facility])     Family History  Problem Relation Age of Onset  . Other Father 24       deceased old age  . Coronary artery disease Mother 53       deceased  . Hypertension Mother   . Other Sister 63       living and healthy  . Coronary artery disease Brother   . Skin cancer Brother   . Cancer Other        family hx of  . Diabetes Other        family hx of  . Coronary artery disease Other        family hx of male < 66  . Arthritis Other        family hx of  . Asthma Other        family hx of    SOCIAL HISTORY: Social History   Tobacco Use  . Smoking status: Former Smoker    Last attempt to quit: 05/23/1990    Years since quitting: 28.1  . Smokeless tobacco:  Never Used  . Tobacco comment: smoked 2 packs per day for 20 years  Substance Use Topics  . Alcohol use: No    Allergies  Allergen Reactions  . Codeine Anaphylaxis  . Oxycontin [Oxycodone Hcl] Shortness Of Breath and Itching  . Morphine And Related Itching    Current Outpatient Medications  Medication Sig Dispense Refill  . aspirin EC 81 MG tablet Take 81 mg by mouth daily.    . carvedilol (COREG) 12.5 MG tablet Take 1 tablet (12.5 mg total) by mouth 2 (two) times daily with a meal. 60 tablet 0  . gabapentin (NEURONTIN) 300 MG capsule Take 300 mg by mouth daily as needed for pain.  1  . ketoconazole (NIZORAL) 2 % shampoo Apply 1 application topically 2 (two) times a week.     . Lacosamide 100 MG TABS Take 1 tablet (100 mg total) by mouth 2 (two) times daily. 60 tablet 0  . losartan (COZAAR) 100 MG tablet Take 100 mg by mouth daily.     . nitroGLYCERIN (NITROSTAT) 0.4 MG SL tablet Place 0.4 mg under the  tongue every 5 (five) minutes as needed for chest pain.     Marland Kitchen prochlorperazine (COMPAZINE) 10 MG tablet Take 1 tablet (10 mg total) by mouth 2 (two) times daily as needed (Headache). 10 tablet 0  . rosuvastatin (CRESTOR) 20 MG tablet Take 1 tablet (20 mg total) by mouth daily at 6 PM. 30 tablet 0   No current facility-administered medications for this visit.     REVIEW OF SYSTEMS:  [X]  denotes positive finding, [ ]  denotes negative finding Cardiac  Comments:  Chest pain or chest pressure:    Shortness of breath upon exertion:    Short of breath when lying flat:    Irregular heart rhythm:        Vascular    Pain in calf, thigh, or hip brought on by ambulation:    Pain in feet at night that wakes you up from your sleep:     Blood clot in your veins:    Leg swelling:           PHYSICAL EXAM: Vitals:   07/24/18 1003 07/24/18 1006  BP: (!) 145/76 (!) 141/79  Pulse: 60   Resp: 20   Temp: (!) 97.5 F (36.4 C)   SpO2: 98%   Weight: 216 lb 0.8 oz (98 kg)   Height: 5\' 8"   (1.727 m)     GENERAL: The patient is a well-nourished male, in no acute distress. The vital signs are documented above. CARDIOVASCULAR: Right neck incision is well-healed.  He has no bruits bilaterally. PULMONARY: There is good air exchange  MUSCULOSKELETAL: There are no major deformities or cyanosis. NEUROLOGIC: No focal weakness or paresthesias are detected. SKIN: There are no ulcers or rashes noted. PSYCHIATRIC: The patient has a normal affect.  DATA:  Carotid duplex today reveals widely patent endarterectomy on the right and no significant stenosis in the left carotid.  MEDICAL ISSUES: Unclear as to the etiology of his stroke.  I does have questions regarding medications specifically antiseizure medications.  Will continue to address these with neurology.  We will see him again in 1 year with repeat carotid duplex    Rosetta Posner, MD [facility] Vascular and Vein Specialists of [facility] Tel [phone] Pager [phone]

## 2018-07-27 DIAGNOSIS — J209 Acute bronchitis, unspecified: Secondary | ICD-10-CM | POA: Diagnosis not present

## 2018-07-27 DIAGNOSIS — R05 Cough: Secondary | ICD-10-CM | POA: Diagnosis not present

## 2018-08-07 DIAGNOSIS — J069 Acute upper respiratory infection, unspecified: Secondary | ICD-10-CM | POA: Diagnosis not present

## 2018-08-07 DIAGNOSIS — R05 Cough: Secondary | ICD-10-CM | POA: Diagnosis not present

## 2018-08-15 ENCOUNTER — Telehealth: Payer: Self-pay | Admitting: Neurology

## 2018-08-15 NOTE — Telephone Encounter (Signed)
Called and spoke to patient and he gave consent for telephone call and consent to bill his insurance . Dr, Leonie Man patient is looking forward to your telephone call . [phone] . Thanks Hinton Dyer .

## 2018-08-16 ENCOUNTER — Other Ambulatory Visit: Payer: Self-pay

## 2018-08-16 ENCOUNTER — Encounter: Payer: Self-pay | Admitting: Neurology

## 2018-08-16 ENCOUNTER — Ambulatory Visit (INDEPENDENT_AMBULATORY_CARE_PROVIDER_SITE_OTHER): Payer: PPO | Admitting: Neurology

## 2018-08-16 DIAGNOSIS — I6381 Other cerebral infarction due to occlusion or stenosis of small artery: Secondary | ICD-10-CM

## 2018-08-16 DIAGNOSIS — R569 Unspecified convulsions: Secondary | ICD-10-CM

## 2018-08-16 NOTE — Progress Notes (Addendum)
Virtual Visit via Telephone Note  I connected with Jeremy [name] on 08/16/18 at  2:30 PM EDT by a video enabled telemedicine application and verified that I am speaking with the correct person using two identifiers.   I discussed the limitations of evaluation and management by telemedicine and the availability of in person appointments. The patient expressed understanding and agreed to proceed.  History of Present Illness: History is obtained from the patient and his girlfriend via telephone.  He was admitted in January 2020 with an episode of unresponsiveness followed by agitation and believed to have a seizure.  He required intubation.  He was treated with IV Keppra, Dilantin and Vimpat.  His MRI scan showed old right parietal infarct with encephalomalacia and tiny left occipital punctate infarct.  Patient was discharged home and had significant altered mental status and sleepiness which was felt to be related to seizure medications and primary care physician has tapered and discontinued seizure medications.  Is done well without recurrent seizures.  He has no new complaints.   Observations/Objective: Hospital electronic medical records as well as imaging films were reviewed personally. Assessment and Plan: 73 year old African-American male with episode of unresponsiveness followed by agitation possibly unwitnessed seizure in January 2020.  Remote history of right MCA infarct with carotid surgery for carotid stenosis.  He was unable to tolerate Keppra, Dilantin and Vimpat due to cognitive side effects.   Follow Up Instructions: I had a long d/w patient and his girlfriendabout his recent   ? seizure stroke, risk for recurrent stroke/TIAs, personally independently reviewed imaging studies and stroke evaluation results and answered questions.Continue  Aspirin   for secondary stroke prevention and d/w his cardiologist wether he still needs plavix for his cardiac stents .Maintain strict control of  hypertension with blood pressure goal below 130/90, diabetes with hemoglobin A1c goal below 6.5% and lipids with LDL cholesterol goal below 70 mg/dL. I also advised the patient to eat a healthy diet with plenty of whole grains, cereals, fruits and vegetables, exercise regularly and maintain ideal body weight. Increase gabapentin dose to 300 mg three times daily for seizure prevention. Followup in the future with my nurse practitioner Janett Billow in 3 months or call earlier if needed    I discussed the assessment and treatment plan with the patient. The patient was provided an opportunity to ask questions and all were answered. The patient agreed with the plan and demonstrated an understanding of the instructions.   The patient was advised to call back or seek an in-person evaluation if the symptoms worsen or if the condition fails to improve as anticipated.  I provided 28minutes of non-face-to-face time during this encounter.   Antony Contras, MD

## 2018-08-16 NOTE — Patient Instructions (Signed)
I had a long d/w patient and his girlfriendabout his recent   ? seizure stroke, risk for recurrent stroke/TIAs, personally independently reviewed imaging studies and stroke evaluation results and answered questions.Continue  Aspirin   for secondary stroke prevention and d/w his cardiologist wether he still needs plavix for his cardiac stents .Maintain strict control of hypertension with blood pressure goal below 130/90, diabetes with hemoglobin A1c goal below 6.5% and lipids with LDL cholesterol goal below 70 mg/dL. I also advised the patient to eat a healthy diet with plenty of whole grains, cereals, fruits and vegetables, exercise regularly and maintain ideal body weight. Increase gabapentin dose to 300 mg three times daily for seizure prevention. Followup in the future with my nurse practitioner Janett Billow in 3 months or call earlier if needed

## 2018-08-17 DIAGNOSIS — E782 Mixed hyperlipidemia: Secondary | ICD-10-CM | POA: Diagnosis not present

## 2018-08-17 DIAGNOSIS — I10 Essential (primary) hypertension: Secondary | ICD-10-CM | POA: Diagnosis not present

## 2018-08-17 DIAGNOSIS — R05 Cough: Secondary | ICD-10-CM | POA: Diagnosis not present

## 2018-08-17 DIAGNOSIS — E1122 Type 2 diabetes mellitus with diabetic chronic kidney disease: Secondary | ICD-10-CM | POA: Diagnosis not present

## 2018-08-17 DIAGNOSIS — I2510 Atherosclerotic heart disease of native coronary artery without angina pectoris: Secondary | ICD-10-CM | POA: Diagnosis not present

## 2018-08-17 DIAGNOSIS — N183 Chronic kidney disease, stage 3 (moderate): Secondary | ICD-10-CM | POA: Diagnosis not present

## 2018-08-21 ENCOUNTER — Telehealth: Payer: Self-pay | Admitting: Neurology

## 2018-08-21 ENCOUNTER — Other Ambulatory Visit: Payer: Self-pay

## 2018-08-21 MED ORDER — GABAPENTIN 300 MG PO CAPS: 300.0000 mg | ORAL_CAPSULE | Freq: Three times a day (TID) | ORAL | 6 refills | Status: AC

## 2018-08-21 NOTE — Telephone Encounter (Signed)
Medication resent again to [facility] [place].

## 2018-08-21 NOTE — Telephone Encounter (Signed)
Jeremy [name] called stating that the pharmacy has not received the RX for the pts gabapentin (NEURONTIN) 300 MG capsule please fax again.

## 2018-08-23 DIAGNOSIS — I2510 Atherosclerotic heart disease of native coronary artery without angina pectoris: Secondary | ICD-10-CM | POA: Diagnosis not present

## 2018-08-23 DIAGNOSIS — I10 Essential (primary) hypertension: Secondary | ICD-10-CM | POA: Diagnosis not present

## 2018-08-23 DIAGNOSIS — E1122 Type 2 diabetes mellitus with diabetic chronic kidney disease: Secondary | ICD-10-CM | POA: Diagnosis not present

## 2018-08-23 DIAGNOSIS — N183 Chronic kidney disease, stage 3 (moderate): Secondary | ICD-10-CM | POA: Diagnosis not present

## 2018-08-23 DIAGNOSIS — E782 Mixed hyperlipidemia: Secondary | ICD-10-CM | POA: Diagnosis not present

## 2018-09-11 DIAGNOSIS — Z0000 Encounter for general adult medical examination without abnormal findings: Secondary | ICD-10-CM | POA: Diagnosis not present

## 2018-09-21 DIAGNOSIS — I10 Essential (primary) hypertension: Secondary | ICD-10-CM | POA: Diagnosis not present

## 2018-09-21 DIAGNOSIS — R5383 Other fatigue: Secondary | ICD-10-CM | POA: Diagnosis not present

## 2018-09-21 DIAGNOSIS — R069 Unspecified abnormalities of breathing: Secondary | ICD-10-CM | POA: Diagnosis not present

## 2018-10-22 DIAGNOSIS — E782 Mixed hyperlipidemia: Secondary | ICD-10-CM | POA: Diagnosis not present

## 2018-10-22 DIAGNOSIS — I10 Essential (primary) hypertension: Secondary | ICD-10-CM | POA: Diagnosis not present

## 2018-10-22 DIAGNOSIS — E1122 Type 2 diabetes mellitus with diabetic chronic kidney disease: Secondary | ICD-10-CM | POA: Diagnosis not present

## 2018-10-22 DIAGNOSIS — N183 Chronic kidney disease, stage 3 (moderate): Secondary | ICD-10-CM | POA: Diagnosis not present

## 2018-10-22 DIAGNOSIS — I2510 Atherosclerotic heart disease of native coronary artery without angina pectoris: Secondary | ICD-10-CM | POA: Diagnosis not present

## 2018-10-23 DIAGNOSIS — E1122 Type 2 diabetes mellitus with diabetic chronic kidney disease: Secondary | ICD-10-CM | POA: Diagnosis not present

## 2018-10-23 DIAGNOSIS — I259 Chronic ischemic heart disease, unspecified: Secondary | ICD-10-CM | POA: Diagnosis not present

## 2018-10-23 DIAGNOSIS — E782 Mixed hyperlipidemia: Secondary | ICD-10-CM | POA: Diagnosis not present

## 2018-10-23 DIAGNOSIS — I10 Essential (primary) hypertension: Secondary | ICD-10-CM | POA: Diagnosis not present

## 2018-10-23 DIAGNOSIS — M5440 Lumbago with sciatica, unspecified side: Secondary | ICD-10-CM | POA: Diagnosis not present

## 2018-10-23 DIAGNOSIS — M545 Low back pain: Secondary | ICD-10-CM | POA: Diagnosis not present

## 2018-10-23 DIAGNOSIS — N401 Enlarged prostate with lower urinary tract symptoms: Secondary | ICD-10-CM | POA: Diagnosis not present

## 2018-10-23 DIAGNOSIS — N400 Enlarged prostate without lower urinary tract symptoms: Secondary | ICD-10-CM | POA: Diagnosis not present

## 2018-10-23 DIAGNOSIS — N529 Male erectile dysfunction, unspecified: Secondary | ICD-10-CM | POA: Diagnosis not present

## 2018-10-23 DIAGNOSIS — G589 Mononeuropathy, unspecified: Secondary | ICD-10-CM | POA: Diagnosis not present

## 2018-10-23 DIAGNOSIS — R1012 Left upper quadrant pain: Secondary | ICD-10-CM | POA: Diagnosis not present

## 2018-10-23 DIAGNOSIS — N183 Chronic kidney disease, stage 3 (moderate): Secondary | ICD-10-CM | POA: Diagnosis not present

## 2018-10-26 DIAGNOSIS — E782 Mixed hyperlipidemia: Secondary | ICD-10-CM | POA: Diagnosis not present

## 2018-10-26 DIAGNOSIS — M19011 Primary osteoarthritis, right shoulder: Secondary | ICD-10-CM | POA: Diagnosis not present

## 2018-10-26 DIAGNOSIS — I129 Hypertensive chronic kidney disease with stage 1 through stage 4 chronic kidney disease, or unspecified chronic kidney disease: Secondary | ICD-10-CM | POA: Diagnosis not present

## 2018-10-26 DIAGNOSIS — N183 Chronic kidney disease, stage 3 (moderate): Secondary | ICD-10-CM | POA: Diagnosis not present

## 2018-10-26 DIAGNOSIS — F5221 Male erectile disorder: Secondary | ICD-10-CM | POA: Diagnosis not present

## 2018-10-26 DIAGNOSIS — Z8673 Personal history of transient ischemic attack (TIA), and cerebral infarction without residual deficits: Secondary | ICD-10-CM | POA: Diagnosis not present

## 2018-10-26 DIAGNOSIS — I2510 Atherosclerotic heart disease of native coronary artery without angina pectoris: Secondary | ICD-10-CM | POA: Diagnosis not present

## 2019-01-29 DIAGNOSIS — M25512 Pain in left shoulder: Secondary | ICD-10-CM | POA: Diagnosis not present

## 2019-01-29 DIAGNOSIS — M25511 Pain in right shoulder: Secondary | ICD-10-CM | POA: Diagnosis not present

## 2019-01-30 ENCOUNTER — Emergency Department (HOSPITAL_COMMUNITY): Payer: PPO

## 2019-01-30 ENCOUNTER — Other Ambulatory Visit: Payer: Self-pay

## 2019-01-30 ENCOUNTER — Emergency Department (HOSPITAL_COMMUNITY)
Admission: EM | Admit: 2019-01-30 | Discharge: 2019-01-30 | Disposition: A | Discharge status: Home / Self Care | Payer: PPO | Attending: Emergency Medicine | Admitting: Emergency Medicine

## 2019-01-30 ENCOUNTER — Encounter (HOSPITAL_COMMUNITY): Payer: Self-pay | Admitting: Emergency Medicine

## 2019-01-30 DIAGNOSIS — Z7982 Long term (current) use of aspirin: Secondary | ICD-10-CM | POA: Insufficient documentation

## 2019-01-30 DIAGNOSIS — R110 Nausea: Secondary | ICD-10-CM

## 2019-01-30 DIAGNOSIS — I129 Hypertensive chronic kidney disease with stage 1 through stage 4 chronic kidney disease, or unspecified chronic kidney disease: Secondary | ICD-10-CM | POA: Diagnosis not present

## 2019-01-30 DIAGNOSIS — R0602 Shortness of breath: Secondary | ICD-10-CM | POA: Insufficient documentation

## 2019-01-30 DIAGNOSIS — Z6832 Body mass index (BMI) 32.0-32.9, adult: Secondary | ICD-10-CM | POA: Diagnosis not present

## 2019-01-30 DIAGNOSIS — Z79899 Other long term (current) drug therapy: Secondary | ICD-10-CM | POA: Diagnosis not present

## 2019-01-30 DIAGNOSIS — R531 Weakness: Secondary | ICD-10-CM | POA: Diagnosis not present

## 2019-01-30 DIAGNOSIS — Z20828 Contact with and (suspected) exposure to other viral communicable diseases: Secondary | ICD-10-CM | POA: Insufficient documentation

## 2019-01-30 DIAGNOSIS — Z8673 Personal history of transient ischemic attack (TIA), and cerebral infarction without residual deficits: Secondary | ICD-10-CM | POA: Diagnosis not present

## 2019-01-30 DIAGNOSIS — I2510 Atherosclerotic heart disease of native coronary artery without angina pectoris: Secondary | ICD-10-CM | POA: Diagnosis not present

## 2019-01-30 DIAGNOSIS — E669 Obesity, unspecified: Secondary | ICD-10-CM | POA: Diagnosis not present

## 2019-01-30 DIAGNOSIS — Z87891 Personal history of nicotine dependence: Secondary | ICD-10-CM | POA: Diagnosis not present

## 2019-01-30 DIAGNOSIS — N183 Chronic kidney disease, stage 3 (moderate): Secondary | ICD-10-CM | POA: Diagnosis not present

## 2019-01-30 DIAGNOSIS — R0600 Dyspnea, unspecified: Secondary | ICD-10-CM

## 2019-01-30 DIAGNOSIS — I252 Old myocardial infarction: Secondary | ICD-10-CM | POA: Diagnosis not present

## 2019-01-30 DIAGNOSIS — Z955 Presence of coronary angioplasty implant and graft: Secondary | ICD-10-CM | POA: Insufficient documentation

## 2019-01-30 LAB — CBC WITH DIFFERENTIAL/PLATELET
Abs Immature Granulocytes: 0.02 10*3/uL (ref 0.00–0.07)
Basophils Absolute: 0 10*3/uL (ref 0.0–0.1)
Basophils Relative: 0 %
Eosinophils Absolute: 0 10*3/uL (ref 0.0–0.5)
Eosinophils Relative: 0 %
HCT: 42.9 % (ref 39.0–52.0)
Hemoglobin: 14.4 g/dL (ref 13.0–17.0)
Immature Granulocytes: 0 %
Lymphocytes Relative: 6 %
Lymphs Abs: 0.4 10*3/uL — ABNORMAL LOW (ref 0.7–4.0)
MCH: 30.4 pg (ref 26.0–34.0)
MCHC: 33.6 g/dL (ref 30.0–36.0)
MCV: 90.7 fL (ref 80.0–100.0)
Monocytes Absolute: 0.1 10*3/uL (ref 0.1–1.0)
Monocytes Relative: 1 %
Neutro Abs: 7.2 10*3/uL (ref 1.7–7.7)
Neutrophils Relative %: 93 %
Platelets: 230 10*3/uL (ref 150–400)
RBC: 4.73 MIL/uL (ref 4.22–5.81)
RDW: 12.2 % (ref 11.5–15.5)
WBC: 7.8 10*3/uL (ref 4.0–10.5)
nRBC: 0 % (ref 0.0–0.2)

## 2019-01-30 LAB — COMPREHENSIVE METABOLIC PANEL
ALT: 24 U/L (ref 0–44)
AST: 20 U/L (ref 15–41)
Albumin: 3.9 g/dL (ref 3.5–5.0)
Alkaline Phosphatase: 55 U/L (ref 38–126)
Anion gap: 9 (ref 5–15)
BUN: 26 mg/dL — ABNORMAL HIGH (ref 8–23)
CO2: 23 mmol/L (ref 22–32)
Calcium: 9.3 mg/dL (ref 8.9–10.3)
Chloride: 103 mmol/L (ref 98–111)
Creatinine, Ser: 1.3 mg/dL — ABNORMAL HIGH (ref 0.61–1.24)
GFR calc Af Amer: >60 mL/min (ref >60)
GFR calc non Af Amer: 55 mL/min — ABNORMAL LOW (ref >60)
Glucose, Bld: 138 mg/dL — ABNORMAL HIGH (ref 70–99)
Potassium: 4.2 mmol/L (ref 3.5–5.1)
Sodium: 135 mmol/L (ref 135–145)
Total Bilirubin: 0.6 mg/dL (ref 0.3–1.2)
Total Protein: 7.2 g/dL (ref 6.5–8.1)

## 2019-01-30 LAB — D-DIMER, QUANTITATIVE: D-Dimer, Quant: 0.88 ug/mL-FEU — ABNORMAL HIGH (ref 0.00–0.50)

## 2019-01-30 LAB — CBG MONITORING, ED: Glucose-Capillary: 130 mg/dL — ABNORMAL HIGH (ref 70–99)

## 2019-01-30 LAB — TROPONIN I (HIGH SENSITIVITY)
Troponin I (High Sensitivity): 5 ng/L (ref <18)
Troponin I (High Sensitivity): 5 ng/L (ref <18)

## 2019-01-30 LAB — SARS CORONAVIRUS 2 BY RT PCR (HOSPITAL ORDER, PERFORMED IN ~~LOC~~ HOSPITAL LAB): SARS Coronavirus 2: NEGATIVE

## 2019-01-30 LAB — BRAIN NATRIURETIC PEPTIDE: B Natriuretic Peptide: 105 pg/mL — ABNORMAL HIGH (ref 0.0–100.0)

## 2019-01-30 MED ORDER — ONDANSETRON HCL 4 MG/2ML IJ SOLN
4.0000 mg | Freq: Once | INTRAMUSCULAR | Status: AC
  Administered 2019-01-30: 4 mg via INTRAVENOUS
  Filled 2019-01-30: qty 2

## 2019-01-30 MED ORDER — ONDANSETRON 4 MG PO TBDP: 4.0000 mg | ORAL_TABLET | Freq: Three times a day (TID) | ORAL | 0 refills | Status: DC | PRN

## 2019-01-30 MED ORDER — SODIUM CHLORIDE 0.9 % IV BOLUS
500.0000 mL | Freq: Once | INTRAVENOUS | Status: AC
  Administered 2019-01-30: 500 mL via INTRAVENOUS

## 2019-01-30 MED ORDER — IOHEXOL 350 MG/ML SOLN
100.0000 mL | Freq: Once | INTRAVENOUS | Status: AC | PRN
  Administered 2019-01-30: 100 mL via INTRAVENOUS

## 2019-01-30 NOTE — ED Provider Notes (Signed)
[facility] EMERGENCY DEPARTMENT Provider Note   CSN: [id number] Arrival date & time: 01/30/19  [id number]     History   Chief Complaint Chief Complaint  Patient presents with   Shortness of Breath    HPI Jeremy [name] is a 73 y.o. male.     Patient with history of CAD, hypertension, previous stroke, CKD, chronic arthritis with injections in his shoulders bilaterally yesterday presenting from home with episode of "not feeling right" upon waking this morning but now feels improved.  States he went to bed feeling fine.  He woke up about 4 AM with some nausea, shortness of breath, clamminess and some tingling in his legs.  His wife states his blood pressure was elevated at [id number] systolic.  Patient states the symptoms lasted about 10 minutes but are now resolved and he feels better.  This is never happened to him before.  He denies having any chest pain or abdominal pain.  No recent cough or fever or illness.  No recent medication changes.  States he did have chest pain with his heart attacks.  No pain with urination or blood in the urine.  He states he was not feeling dizzy or lightheaded when he woke up but just felt uneasy with nausea and clamminess and some tingling in his legs.  No focal deficits.  He now feels improved. Notably did present with a seizure in setting of his stroke in February but has not had further seizures and has been weaned from antiepileptics by his neurologist.   The history is provided by the patient and the spouse.    Past Medical History:  Diagnosis Date   Arthritis    Carotid stenosis, asymptomatic, right    Carpal tunnel syndrome    bilateral   Coronary artery disease    GERD (gastroesophageal reflux disease)    History of kidney stones    Hx of colonic polyp    Hypercholesterolemia    Hypertension    Myocardial infarction ([place])    hx of   Obesity    Stroke [facility])     Patient Active Problem List   Diagnosis Date Noted   Cerebral  thrombosis with cerebral infarction 06/28/2018   Hypernatremia 06/27/2018   CKD (chronic kidney disease), stage III ([place]) 06/27/2018   Acute respiratory failure with hypoxia ([place])    Seizure ([place]) 06/20/2018   Acute encephalopathy 06/20/2018   Asymptomatic carotid artery stenosis without infarction, right 12/25/2017   Pre-operative cardiovascular examination 11/03/2017   Carotid artery disease ([place]) 11/03/2017   Shoulder joint pain 09/17/2013   Numbness around mouth 12/01/2011   Erectile dysfunction 09/05/2010   TRIGGER FINGER, LEFT MIDDLE 04/20/2010   ANAL FISSURE 04/03/2009   Mixed dyslipidemia 04/01/2009   RECTAL BLEEDING 04/01/2009   ABDOMINAL PAIN-LLQ 04/01/2009   ABDOMINAL PAIN, GENERALIZED 03/30/2009   OTITIS EXTERNA, LEFT 05/07/2008   BRONCHITIS, ACUTE 04/21/2008   CARPAL TUNNEL SYNDROME, BILATERAL 12/25/2007   COLD SORE 03/12/2007   CAD (coronary artery disease) 01/01/2007   HYPERCHOLESTEROLEMIA, PURE 06/19/2006   OBESITY NOS 06/19/2006   Essential hypertension 06/19/2006   MYOCARDIAL INFARCTION, HX OF 06/19/2006   COLONIC POLYPS, HX OF 06/19/2006    Past Surgical History:  Procedure Laterality Date   CARDIAC CATHETERIZATION     CORONARY STENT PLACEMENT  2000   x3    ENDARTERECTOMY Right 12/25/2017   Procedure: ENDARTERECTOMY CAROTID RIGHT;  Surgeon: Rosetta Posner, MD;  Location: [place];  Service: Vascular;  Laterality: Right;   PATCH  ANGIOPLASTY Right 12/25/2017   Procedure: PATCH ANGIOPLASTY RIGHT CAROTID ARTERY;  Surgeon: Rosetta Posner, MD;  Location: [place];  Service: Vascular;  Laterality: Right;   TEE WITHOUT CARDIOVERSION N/A 06/29/2018   Procedure: TRANSESOPHAGEAL ECHOCARDIOGRAM (TEE);  Surgeon: Fay Records, MD;  Location: [facility] ENDOSCOPY;  Service: Cardiovascular;  Laterality: N/A;        Home Medications    Prior to Admission medications   Medication Sig Start Date End Date Taking? Authorizing Provider  aspirin EC 81 MG  tablet Take 81 mg by mouth daily.    [provider]  carvedilol (COREG) 12.5 MG tablet Take 1 tablet (12.5 mg total) by mouth 2 (two) times daily with a meal. 06/30/18   Mariel Aloe, MD  gabapentin (NEURONTIN) 300 MG capsule Take 1 capsule (300 mg total) by mouth 3 (three) times daily. 08/21/18   Garvin Fila, MD  ketoconazole (NIZORAL) 2 % shampoo Apply 1 application topically 2 (two) times a week.  03/05/18   [provider]  losartan (COZAAR) 100 MG tablet Take 100 mg by mouth daily.  05/14/18   [provider]  nitroGLYCERIN (NITROSTAT) 0.4 MG SL tablet Place 0.4 mg under the tongue every 5 (five) minutes as needed for chest pain.     [provider]  prochlorperazine (COMPAZINE) 10 MG tablet Take 1 tablet (10 mg total) by mouth 2 (two) times daily as needed (Headache). 10/27/17   Charlann Lange, PA-C  rosuvastatin (CRESTOR) 20 MG tablet Take 1 tablet (20 mg total) by mouth daily at 6 PM. 06/30/18   Mariel Aloe, MD    Family History Family History  Problem Relation Age of Onset   Other Father 56       deceased old age   Coronary artery disease Mother 33       deceased   Hypertension Mother    Other Sister 21       living and healthy   Coronary artery disease Brother    Skin cancer Brother    Cancer Other        family hx of   Diabetes Other        family hx of   Coronary artery disease Other        family hx of male < 80   Arthritis Other        family hx of   Asthma Other        family hx of    Social History Social History   Tobacco Use   Smoking status: Former Smoker    Quit date: 05/23/1990    Years since quitting: 28.7   Smokeless tobacco: Never Used   Tobacco comment: smoked 2 packs per day for 20 years  Substance Use Topics   Alcohol use: No   Drug use: No     Allergies   Codeine, Oxycontin [oxycodone hcl], and Morphine and related   Review of Systems Review of Systems  Constitutional: Positive  for diaphoresis. Negative for activity change, appetite change and fatigue.  HENT: Negative for congestion and rhinorrhea.   Respiratory: Positive for shortness of breath. Negative for chest tightness.   Cardiovascular: Negative for chest pain.  Gastrointestinal: Negative for abdominal pain, nausea and vomiting.  Genitourinary: Negative for dysuria and hematuria.  Musculoskeletal: Negative for arthralgias and myalgias.  Skin: Negative for wound.  Neurological: Positive for weakness. Negative for dizziness and light-headedness.   all other systems are negative except as noted in the  HPI and PMH.     Physical Exam Updated Vital Signs BP (!) 158/68    Pulse 84    Temp 98.4 F (36.9 C) (Oral)    Resp 15    Ht 5\' 8"  (1.727 m)    Wt 97.5 kg    SpO2 96%    BMI 32.69 kg/m   Physical Exam Vitals signs and nursing note reviewed.  Constitutional:      General: He is not in acute distress.    Appearance: He is well-developed. He is obese.  HENT:     Head: Normocephalic and atraumatic.     Mouth/Throat:     Pharynx: No oropharyngeal exudate.  Eyes:     Conjunctiva/sclera: Conjunctivae normal.     Pupils: Pupils are equal, round, and reactive to light.  Neck:     Musculoskeletal: Normal range of motion and neck supple.     Comments: No meningismus. Cardiovascular:     Rate and Rhythm: Normal rate and regular rhythm.     Heart sounds: Normal heart sounds. No murmur.     Comments: Equal femoral, DP and PT pulses. Pulmonary:     Effort: Pulmonary effort is normal. No respiratory distress.     Breath sounds: Normal breath sounds.  Chest:     Chest wall: No tenderness.  Abdominal:     Palpations: Abdomen is soft.     Tenderness: There is no abdominal tenderness. There is no guarding or rebound.  Musculoskeletal: Normal range of motion.        General: No tenderness.  Skin:    General: Skin is warm.     Capillary Refill: Capillary refill takes less than 2 seconds.  Neurological:      General: No focal deficit present.     Mental Status: He is alert and oriented to person, place, and time. Mental status is at baseline.     Cranial Nerves: No cranial nerve deficit.     Motor: No abnormal muscle tone.     Coordination: Coordination normal.     Comments: No ataxia on finger to nose bilaterally. No pronator drift. 5/5 strength throughout. CN 2-12 intact.Equal grip strength. Sensation intact.   Psychiatric:        Behavior: Behavior normal.      ED Treatments / Results  Labs (all labs ordered are listed, but only abnormal results are displayed) Labs Reviewed  CBC WITH DIFFERENTIAL/PLATELET - Abnormal; Notable for the following components:      Result Value   Lymphs Abs 0.4 (*)    All other components within normal limits  COMPREHENSIVE METABOLIC PANEL - Abnormal; Notable for the following components:   Glucose, Bld 138 (*)    BUN 26 (*)    Creatinine, Ser 1.30 (*)    GFR calc non Af Amer 55 (*)    All other components within normal limits  BRAIN NATRIURETIC PEPTIDE - Abnormal; Notable for the following components:   B Natriuretic Peptide 105.0 (*)    All other components within normal limits  CBG MONITORING, ED - Abnormal; Notable for the following components:   Glucose-Capillary 130 (*)    All other components within normal limits  SARS CORONAVIRUS 2 (HOSPITAL ORDER, [place] [facility])  URINALYSIS, ROUTINE W REFLEX MICROSCOPIC  D-DIMER, QUANTITATIVE (NOT AT [facility])  TROPONIN I (HIGH SENSITIVITY)  TROPONIN I (HIGH SENSITIVITY)    EKG EKG Interpretation  Date/Time:  Wednesday January 30 2019 04:51:42 EDT Ventricular Rate:  85 PR Interval:  QRS Duration: 126 QT Interval:  373 QTC Calculation: 444 R Axis:   4 Text Interpretation:  Sinus rhythm Prolonged PR interval Left bundle branch block now LBBB Confirmed by Ezequiel Essex [phone]) on 01/30/2019 4:57:45 AM   Radiology Dg Chest 2 View  Result Date: 01/30/2019 CLINICAL DATA:   Weakness EXAM: CHEST - 2 VIEW COMPARISON:  06/25/2018 FINDINGS: Normal heart size and mediastinal contours. Saber trachea. There is no edema, consolidation, effusion, or pneumothorax. Artifact from EKG leads. Generalized spondylosis. IMPRESSION: No evidence of acute disease. Electronically Signed   By: Monte Fantasia M.D.   On: 01/30/2019 06:28    Procedures Procedures (including critical care time)  Medications Ordered in ED Medications - No data to display   Initial Impression / Assessment and Plan / ED Course  I have reviewed the triage vital signs and the nursing notes.  Pertinent labs & imaging results that were available during my care of the patient were reviewed by me and considered in my medical decision making (see chart for details).       Episode of nausea, clamminess and shortness of breath that has since resolved.  Patient feels back to baseline. CBG is 130.  EKG shows left bundle branch block. Neurological exam is nonfocal.  Abdominal imaging in February showed no evidence of AAA.   Heart rate 81 sitting, 98 standing.  Patient complains of nausea when he stands.  His EF is 35 to 40% on echocardiogram in February.  Will give gentle hydration  Labs are reassuring.  Stable hemoglobin is stable creatinine.  Troponin is negative. No evidence of CHF exacerbation.  Patient states he still feels little short of breath.  His chest x-ray is negative.  His O2 saturation varies between 92 to 97%.  His lungs are clear.  He agrees to coronavirus swab as well as d-dimer.  Unclear etiology of his episode of dyspnea and nausea. Second troponin will be obtained as well as d-dimer. Patient does feel improved and is tolerating p.o. and ambulatory.  Care will be transferred to Dr. Laverta Baltimore at shift change.  Final Clinical Impressions(s) / ED Diagnoses   Final diagnoses:  Dyspnea, unspecified type    ED Discharge Orders    None       Amilyah Nack, Annie Main, MD 01/30/19 [phone]

## 2019-01-30 NOTE — ED Notes (Signed)
Pt got very nauseated after getting CT contrast. Notified Dr. Laverta Baltimore. Gave verbal order to give 4mg  Zofran IV.

## 2019-01-30 NOTE — ED Notes (Signed)
Patient transported to XR. 

## 2019-01-30 NOTE — ED Notes (Signed)
Patient transported back from X-ray 

## 2019-01-30 NOTE — ED Provider Notes (Signed)
Blood pressure (!) 158/68, pulse 84, resp. rate 15, height 5\' 8"  (1.727 m), weight 97.5 kg, SpO2 96 %.  Assuming care from Dr. Wyvonnia Dusky.  In short, Jeremy [name] is a 73 y.o. male with a chief complaint of Shortness of Breath .  Refer to the original H&P for additional details.  The current plan of care is to f/u on troponin and d-dimer.  07:30 AM  D-dimer slightly elevated. Will perform CTA.   09:45 AM  CTA negative for PE or other acute abnormality causing the patient's symptoms.  He developed nausea and mild dyspnea on the way back from CT.  No rash, throat tightness, or other symptoms to suspect contrast dye allergy.  Plan for Zofran along with PO challenge.   10 :00 AM  Patient feeling much better after Zofran. No SOB. Repeat EKG unchanged. Normal CTA chest. Plan for discharge with close PCP follow up and ED return precautions.    EKG Interpretation  Date/Time:  Wednesday January 30 2019 09:49:30 EDT Ventricular Rate:  91 PR Interval:    QRS Duration: 128 QT Interval:  377 QTC Calculation: 464 R Axis:   1 Text Interpretation:  Sinus rhythm Left bundle branch block No STEMI  Confirmed by Nanda Quinton [phone]) on 01/30/2019 10:08:09 AM         Long, Wonda Olds, MD 01/30/19 1009

## 2019-01-30 NOTE — Discharge Instructions (Signed)
You were seen in the ED today with trouble breathing, tingling, and nausea. Your ED evaluation here was normal. Call your PCP to schedule a follow up and take the Zofran as needed for nausea. Return to the ED with any new or suddenly worsening symptoms.

## 2019-01-30 NOTE — ED Notes (Signed)
Pt ambulatory to restroom

## 2019-01-30 NOTE — ED Triage Notes (Signed)
Pt states he woke from sleep this morning "not feeling right." Pt states his legs felt "tingly" and he felt SOB. Pt denies pain at this time.

## 2019-02-01 DIAGNOSIS — R112 Nausea with vomiting, unspecified: Secondary | ICD-10-CM | POA: Diagnosis not present

## 2019-02-01 DIAGNOSIS — M25511 Pain in right shoulder: Secondary | ICD-10-CM | POA: Diagnosis not present

## 2019-02-01 DIAGNOSIS — R0602 Shortness of breath: Secondary | ICD-10-CM | POA: Diagnosis not present

## 2019-02-04 ENCOUNTER — Other Ambulatory Visit (HOSPITAL_COMMUNITY): Payer: Self-pay | Admitting: Internal Medicine

## 2019-02-04 ENCOUNTER — Other Ambulatory Visit: Payer: Self-pay | Admitting: Internal Medicine

## 2019-02-04 ENCOUNTER — Encounter: Payer: Self-pay | Admitting: Physician Assistant

## 2019-02-04 DIAGNOSIS — R0602 Shortness of breath: Secondary | ICD-10-CM | POA: Diagnosis not present

## 2019-02-04 DIAGNOSIS — R112 Nausea with vomiting, unspecified: Secondary | ICD-10-CM | POA: Diagnosis not present

## 2019-02-05 ENCOUNTER — Ambulatory Visit (HOSPITAL_COMMUNITY)
Admission: RE | Admit: 2019-02-05 | Discharge: 2019-02-05 | Disposition: A | Discharge status: Home / Self Care | Payer: PPO | Source: Ambulatory Visit | Attending: Internal Medicine | Admitting: Internal Medicine

## 2019-02-05 ENCOUNTER — Other Ambulatory Visit: Payer: Self-pay

## 2019-02-05 DIAGNOSIS — I2510 Atherosclerotic heart disease of native coronary artery without angina pectoris: Secondary | ICD-10-CM | POA: Diagnosis not present

## 2019-02-05 DIAGNOSIS — I700 Atherosclerosis of aorta: Secondary | ICD-10-CM | POA: Insufficient documentation

## 2019-02-05 DIAGNOSIS — E1122 Type 2 diabetes mellitus with diabetic chronic kidney disease: Secondary | ICD-10-CM | POA: Diagnosis not present

## 2019-02-05 DIAGNOSIS — E782 Mixed hyperlipidemia: Secondary | ICD-10-CM | POA: Diagnosis not present

## 2019-02-05 DIAGNOSIS — R112 Nausea with vomiting, unspecified: Secondary | ICD-10-CM | POA: Diagnosis not present

## 2019-02-05 DIAGNOSIS — R0602 Shortness of breath: Secondary | ICD-10-CM | POA: Insufficient documentation

## 2019-02-05 DIAGNOSIS — R1030 Lower abdominal pain, unspecified: Secondary | ICD-10-CM | POA: Insufficient documentation

## 2019-02-05 DIAGNOSIS — N183 Chronic kidney disease, stage 3 (moderate): Secondary | ICD-10-CM | POA: Diagnosis not present

## 2019-02-05 DIAGNOSIS — I10 Essential (primary) hypertension: Secondary | ICD-10-CM | POA: Diagnosis not present

## 2019-02-11 ENCOUNTER — Ambulatory Visit (INDEPENDENT_AMBULATORY_CARE_PROVIDER_SITE_OTHER): Payer: PPO | Admitting: Cardiology

## 2019-02-11 ENCOUNTER — Encounter: Payer: Self-pay | Admitting: Cardiology

## 2019-02-11 ENCOUNTER — Other Ambulatory Visit: Payer: Self-pay

## 2019-02-11 VITALS — BP 144/78 | HR 55 | Temp 97.9°F | Ht 68.0 in | Wt 211.0 lb

## 2019-02-11 DIAGNOSIS — N183 Chronic kidney disease, stage 3 unspecified: Secondary | ICD-10-CM

## 2019-02-11 DIAGNOSIS — I2510 Atherosclerotic heart disease of native coronary artery without angina pectoris: Secondary | ICD-10-CM | POA: Diagnosis not present

## 2019-02-11 DIAGNOSIS — I10 Essential (primary) hypertension: Secondary | ICD-10-CM

## 2019-02-11 DIAGNOSIS — E782 Mixed hyperlipidemia: Secondary | ICD-10-CM

## 2019-02-11 NOTE — Progress Notes (Signed)
Cardiology Office Note:    Date:  02/11/2019   ID:  Jeremy [name], DOB 13-May-1946, MRN [id number]  PCP:  Celene Squibb, MD  Cardiologist:  Jenean Lindau, MD   Referring MD: Celene Squibb, MD    ASSESSMENT:    1. Coronary artery disease involving native coronary artery of native heart without angina pectoris   2. Mixed dyslipidemia   3. CKD (chronic kidney disease), stage III ([place])   4. Essential hypertension    PLAN:    In order of problems listed above:  1. Coronary artery disease: Secondary prevention stressed with the patient.  Importance of compliance with diet and medication stressed and he vocalized understanding.  His blood pressure stable.  Diet was discussed. 2. Essential hypertension: Importance of regular exercise stressed.  He has an element of whitecoat hypertension.  His blood pressure at home is fine. 3. Mixed dyslipidemia: Diet was discussed.  He will have blood work today including fasting lipids. 4. Patient will be seen in follow-up appointment in 6 months or earlier if the patient has any concerns 5. As mentioned above he is seeing his gastroenterologist for black stools.  Emergency room visit records were reviewed extensively and discussed with the patient.   Medication Adjustments/Labs and Tests Ordered: Current medicines are reviewed at length with the patient today.  Concerns regarding medicines are outlined above.  No orders of the defined types were placed in this encounter.  No orders of the defined types were placed in this encounter.    Chief Complaint  Patient presents with  . Follow-up     History of Present Illness:    Jeremy [name] is a 73 y.o. male.  Patient has past medical history of coronary artery disease, essential hypertension, dyslipidemia and stroke.  He is taking clopidogrel on a daily basis.  He leads a sedentary lifestyle.  No chest pain orthopnea or PND.  He went to the emergency room recently and I reviewed his records  extensively.  At the time of my evaluation, the patient is alert awake oriented and in no distress.  Patient mentions to me that he has black stools at times and he is seeing a gastroenterologist for evaluation of this.  Past Medical History:  Diagnosis Date  . Arthritis   . Carotid stenosis, asymptomatic, right   . Carpal tunnel syndrome    bilateral  . Coronary artery disease   . GERD (gastroesophageal reflux disease)   . History of kidney stones   . Hx of colonic polyp   . Hypercholesterolemia   . Hypertension   . Myocardial infarction ([place])    hx of  . Obesity   . Stroke [facility])     Past Surgical History:  Procedure Laterality Date  . CARDIAC CATHETERIZATION    . CORONARY STENT PLACEMENT  2000   x3   . ENDARTERECTOMY Right 12/25/2017   Procedure: ENDARTERECTOMY CAROTID RIGHT;  Surgeon: Rosetta Posner, MD;  Location: [place];  Service: Vascular;  Laterality: Right;  . PATCH ANGIOPLASTY Right 12/25/2017   Procedure: PATCH ANGIOPLASTY RIGHT CAROTID ARTERY;  Surgeon: Rosetta Posner, MD;  Location: [place];  Service: Vascular;  Laterality: Right;  . TEE WITHOUT CARDIOVERSION N/A 06/29/2018   Procedure: TRANSESOPHAGEAL ECHOCARDIOGRAM (TEE);  Surgeon: Fay Records, MD;  Location: [facility] ENDOSCOPY;  Service: Cardiovascular;  Laterality: N/A;    Current Medications: Current Meds  Medication Sig  . carvedilol (COREG) 12.5 MG tablet Take 1 tablet (12.5 mg total)  by mouth 2 (two) times daily with a meal.  . clopidogrel (PLAVIX) 75 MG tablet Take 1 tablet by mouth daily.  . diclofenac (VOLTAREN) 75 MG EC tablet TAKE 1 TABLET BY MOUTH TWICE DAILY AS NEEDED FOR PAIN ONLY TAKE FOR 1 2 WEEKS ROUTINELY  . enalapril (VASOTEC) 10 MG tablet Take 10 mg by mouth daily.  Marland Kitchen gabapentin (NEURONTIN) 300 MG capsule Take 1 capsule (300 mg total) by mouth 3 (three) times daily.  Marland Kitchen ketoconazole (NIZORAL) 2 % shampoo Apply 1 application topically 2 (two) times a week.   . nitroGLYCERIN (NITROSTAT) 0.4 MG SL tablet Place  0.4 mg under the tongue every 5 (five) minutes as needed for chest pain.   Marland Kitchen omeprazole (PRILOSEC) 40 MG capsule TAKE 1 CAPSULE BY MOUTH ONCE DAILY FOR REFLUX  . ondansetron (ZOFRAN ODT) 4 MG disintegrating tablet Take 1 tablet (4 mg total) by mouth every 8 (eight) hours as needed.  Marland Kitchen oxyCODONE-acetaminophen (PERCOCET) 7.5-325 MG tablet Take 1 tablet by mouth 3 (three) times daily as needed. for pain  . prochlorperazine (COMPAZINE) 10 MG tablet Take 1 tablet (10 mg total) by mouth 2 (two) times daily as needed (Headache).  . rosuvastatin (CRESTOR) 20 MG tablet Take 1 tablet (20 mg total) by mouth daily at 6 PM.  . traMADol (ULTRAM) 50 MG tablet Take 1 tablet by mouth every 6 (six) hours as needed.     Allergies:   Codeine, Oxycontin [oxycodone hcl], Contrast media [iodinated diagnostic agents], and Morphine and related   Social History   Socioeconomic History  . Marital status: Divorced    Spouse name: Not on file  . Number of children: Not on file  . Years of education: Not on file  . Highest education level: Not on file  Occupational History  . Occupation: retired    Comment: Building control surveyor  Social Needs  . Financial resource strain: Not on file  . Food insecurity    Worry: Not on file    Inability: Not on file  . Transportation needs    Medical: Not on file    Non-medical: Not on file  Tobacco Use  . Smoking status: Former Smoker    Quit date: 05/23/1990    Years since quitting: 28.7  . Smokeless tobacco: Never Used  . Tobacco comment: smoked 2 packs per day for 20 years  Substance and Sexual Activity  . Alcohol use: No  . Drug use: No  . Sexual activity: Not on file  Lifestyle  . Physical activity    Days per week: Not on file    Minutes per session: Not on file  . Stress: Not on file  Relationships  . Social Herbalist on phone: Not on file    Gets together: Not on file    Attends religious service: Not on file    Active member of club or organization: Not on  file    Attends meetings of clubs or organizations: Not on file    Relationship status: Not on file  Other Topics Concern  . Not on file  Social History Narrative  . Not on file     Family History: The patient's family history includes Arthritis in an other family member; Asthma in an other family member; Cancer in an other family member; Coronary artery disease in his brother and another family member; Coronary artery disease (age of onset: 22) in his mother; Diabetes in an other family member; Hypertension in his mother; Other (age  of onset: 62) in his father; Other (age of onset: 49) in his sister; Skin cancer in his brother.  ROS:   Please see the history of present illness.    All other systems reviewed and are negative.  EKGs/Labs/Other Studies Reviewed:    The following studies were reviewed today: IMPRESSION: 1. No demonstrable pulmonary embolus. No thoracic aortic aneurysm or dissection. There is aortic atherosclerosis as well as foci of great vessel and coronary artery calcification.  2. Areas of mild scattered atelectasis. No edema or consolidation. No pleural effusion.  3.  No evident thoracic adenopathy.  4.  Extensive arthropathy in each shoulder.   Recent Labs: 06/22/2018: TSH 0.634 06/26/2018: Magnesium 2.3 01/30/2019: ALT 24; B Natriuretic Peptide 105.0; BUN 26; Creatinine, Ser 1.30; Hemoglobin 14.4; Platelets 230; Potassium 4.2; Sodium 135  Recent Lipid Panel    Component Value Date/Time   CHOL 195 06/23/2018 0626   TRIG 295 (H) 06/23/2018 1623   HDL 40 (L) 06/23/2018 0626   CHOLHDL 4.9 06/23/2018 0626   VLDL 42 (H) 06/23/2018 0626   LDLCALC 113 (H) 06/23/2018 0626    Physical Exam:    VS:  BP (!) 144/78 (BP Location: Right Arm, Patient Position: Sitting, Cuff Size: Normal)   Pulse (!) 55   Temp 97.9 F (36.6 C)   Ht 5\' 8"  (1.727 m)   Wt 211 lb (95.7 kg)   SpO2 97%   BMI 32.08 kg/m     Wt Readings from Last 3 Encounters:  02/11/19 211 lb  (95.7 kg)  01/30/19 215 lb (97.5 kg)  07/24/18 217 lb (98.4 kg)     GEN: Patient is in no acute distress HEENT: Normal NECK: No JVD; No carotid bruits LYMPHATICS: No lymphadenopathy CARDIAC: Hear sounds regular, 2/6 systolic murmur at the apex. RESPIRATORY:  Clear to auscultation without rales, wheezing or rhonchi  ABDOMEN: Soft, non-tender, non-distended MUSCULOSKELETAL:  No edema; No deformity  SKIN: Warm and dry NEUROLOGIC:  Alert and oriented x 3 PSYCHIATRIC:  Normal affect   Signed, Jenean Lindau, MD  02/11/2019 8:26 AM    [place]

## 2019-02-11 NOTE — Patient Instructions (Signed)

## 2019-02-12 LAB — CBC
Hematocrit: 40 % (ref 37.5–51.0)
Hemoglobin: 13.1 g/dL (ref 13.0–17.7)
MCH: 30.9 pg (ref 26.6–33.0)
MCHC: 32.8 g/dL (ref 31.5–35.7)
MCV: 94 fL (ref 79–97)
Platelets: 201 10*3/uL (ref 150–450)
RBC: 4.24 x10E6/uL (ref 4.14–5.80)
RDW: 12.9 % (ref 11.6–15.4)
WBC: 8 10*3/uL (ref 3.4–10.8)

## 2019-02-12 LAB — HEPATIC FUNCTION PANEL
ALT: 30 IU/L (ref 0–44)
AST: 13 IU/L (ref 0–40)
Albumin: 4 g/dL (ref 3.7–4.7)
Alkaline Phosphatase: 61 IU/L (ref 39–117)
Bilirubin Total: 0.3 mg/dL (ref 0.0–1.2)
Bilirubin, Direct: 0.1 mg/dL (ref 0.00–0.40)
Total Protein: 5.9 g/dL — ABNORMAL LOW (ref 6.0–8.5)

## 2019-02-12 LAB — LIPID PANEL
Chol/HDL Ratio: 2.8 ratio (ref 0.0–5.0)
Cholesterol, Total: 125 mg/dL (ref 100–199)
HDL: 45 mg/dL (ref >39)
LDL Chol Calc (NIH): 64 mg/dL (ref 0–99)
Triglycerides: 83 mg/dL (ref 0–149)
VLDL Cholesterol Cal: 16 mg/dL (ref 5–40)

## 2019-02-12 LAB — BASIC METABOLIC PANEL
BUN/Creatinine Ratio: 20 (ref 10–24)
BUN: 30 mg/dL — ABNORMAL HIGH (ref 8–27)
CO2: 25 mmol/L (ref 20–29)
Calcium: 9.1 mg/dL (ref 8.6–10.2)
Chloride: 103 mmol/L (ref 96–106)
Creatinine, Ser: 1.53 mg/dL — ABNORMAL HIGH (ref 0.76–1.27)
GFR calc Af Amer: 52 mL/min/{1.73_m2} — ABNORMAL LOW (ref >59)
GFR calc non Af Amer: 45 mL/min/{1.73_m2} — ABNORMAL LOW (ref >59)
Glucose: 94 mg/dL (ref 65–99)
Potassium: 4.9 mmol/L (ref 3.5–5.2)
Sodium: 141 mmol/L (ref 134–144)

## 2019-02-13 ENCOUNTER — Ambulatory Visit: Payer: PPO | Admitting: Physician Assistant

## 2019-02-13 ENCOUNTER — Encounter: Payer: Self-pay | Admitting: Physician Assistant

## 2019-02-13 ENCOUNTER — Encounter

## 2019-02-13 VITALS — BP 132/76 | HR 64 | Temp 98.1°F | Ht 68.0 in | Wt 211.8 lb

## 2019-02-13 DIAGNOSIS — R6881 Early satiety: Secondary | ICD-10-CM | POA: Diagnosis not present

## 2019-02-13 DIAGNOSIS — Z7901 Long term (current) use of anticoagulants: Secondary | ICD-10-CM

## 2019-02-13 DIAGNOSIS — R110 Nausea: Secondary | ICD-10-CM | POA: Diagnosis not present

## 2019-02-13 DIAGNOSIS — R5383 Other fatigue: Secondary | ICD-10-CM | POA: Diagnosis not present

## 2019-02-13 DIAGNOSIS — R195 Other fecal abnormalities: Secondary | ICD-10-CM

## 2019-02-13 DIAGNOSIS — R1033 Periumbilical pain: Secondary | ICD-10-CM

## 2019-02-13 MED ORDER — NA SULFATE-K SULFATE-MG SULF 17.5-3.13-1.6 GM/177ML PO SOLN: 1.0000 | Freq: Once | ORAL | 0 refills | Status: AC

## 2019-02-13 NOTE — Patient Instructions (Addendum)
If you are age 73 or older, your body mass index should be between 23-30. Your Body mass index is 32.2 kg/m. If this is out of the aforementioned range listed, please consider follow up with your Primary Care Provider.  If you are age 35 or younger, your body mass index should be between 19-25. Your Body mass index is 32.2 kg/m. If this is out of the aformentioned range listed, please consider follow up with your Primary Care Provider.   You have been scheduled for an endoscopy and colonoscopy. Please follow the written instructions given to you at your visit today. Please pick up your prep supplies at the pharmacy within the next 1-3 days. If you use inhalers (even only as needed), please bring them with you on the day of your procedure. Your physician has requested that you go to www.startemmi.com and enter the access code given to you at your visit today. This web site gives a general overview about your procedure. However, you should still follow specific instructions given to you by our office regarding your preparation for the procedure.  Continue Prilosec 40 mg daily   Stop Voltaren tablets!  You will be contacted by our office prior to your procedure for directions on holding your Plavix.  If you do not hear from our office 1 week prior to your scheduled procedure, please call [phone] to discuss.  It was a pleasure to see you today!

## 2019-02-13 NOTE — Progress Notes (Signed)
Subjective:    Patient ID: Jeremy [name], male    DOB: 27-Apr-1946, 73 y.o.   MRN: [id number]  HPI Jeremy [name] is a pleasant 73 year old white male, known remotely to Dr. Henrene Pastor from prior colonoscopy who is referred today by Dr. Allyn Kenner, MD/PCP for complaints of intermittent dark stools, and follow-up colonoscopy. Patient has significant history of coronary artery disease status post MI and stents x3 remotely, left ventricular dysfunction with EF 40 to 45% by TEE, history of carotid stenosis, left occipital CVA January 2020, chronic kidney disease. Last colonoscopy was done in January 2010 with 1 diminutive polyp removed from the sigmoid colon otherwise negative and was recommended to have 5-year interval follow-up. Patient is currently on Plavix, had also been on baby aspirin which was stopped.  And takes Voltaren on a daily basis alternating with tramadol for shoulder pain. He says he has noticed episodes of dark tarry stools off and on over the past couple of years He says usually when he is going to have 1 of these episodes he will have pain in his mid lower abdomen which may be present off and on for a few days.  Says the dark stools may last for 3 or 4 days and then resolve and he may not have any more episodes for a month or more.  His last episode was about 2 weeks ago.  His wife says he seems very uncomfortable with his abdominal pain at times.  He is also developed some recent nausea and has lost about 5 pounds over the past couple of weeks.  He has developed early satiety over the past few months.  No dysphagia or odynophagia but does endorse heartburn and indigestion regularly. Bowel movements have been fairly regular. He is waiting for a shoulder replacement but has not been scheduled as yet. Recent labs 02/11/2019 hemoglobin 13.1 hematocrit of 40 MCV of 94, creatinine 1.53, LFTs within normal limits. Appendectomy had an ER visit on 01/30/2019 at [facility] with shortness of breath and had CT  angios of the chest with no evidence of pulmonary emboli no thoracic aneurysm he has some mild scattered atelectasis no thoracic adenopathy  CT of the abdomen and pelvis was done outpatient on 02/05/2019 without contrast and was unremarkable.    Review of Systems Pertinent positive and negative review of systems were noted in the above HPI section.  All other review of systems was otherwise negative.  Outpatient Encounter Medications as of 02/13/2019  Medication Sig  . carvedilol (COREG) 12.5 MG tablet Take 1 tablet (12.5 mg total) by mouth 2 (two) times daily with a meal.  . clopidogrel (PLAVIX) 75 MG tablet Take 1 tablet by mouth daily.  . diclofenac (VOLTAREN) 75 MG EC tablet TAKE 1 TABLET BY MOUTH TWICE DAILY AS NEEDED FOR PAIN ONLY TAKE FOR 1 2 WEEKS ROUTINELY  . enalapril (VASOTEC) 10 MG tablet Take 10 mg by mouth daily.  Marland Kitchen gabapentin (NEURONTIN) 300 MG capsule Take 1 capsule (300 mg total) by mouth 3 (three) times daily.  Marland Kitchen ketoconazole (NIZORAL) 2 % shampoo Apply 1 application topically 2 (two) times a week. AS NEEDED  . nitroGLYCERIN (NITROSTAT) 0.4 MG SL tablet Place 0.4 mg under the tongue every 5 (five) minutes as needed for chest pain.   Marland Kitchen omeprazole (PRILOSEC) 40 MG capsule TAKE 1 CAPSULE BY MOUTH ONCE DAILY FOR REFLUX  . ondansetron (ZOFRAN ODT) 4 MG disintegrating tablet Take 1 tablet (4 mg total) by mouth every 8 (eight) hours as  needed.  Marland Kitchen oxyCODONE-acetaminophen (PERCOCET) 7.5-325 MG tablet Take 1 tablet by mouth 3 (three) times daily as needed. for pain  . prochlorperazine (COMPAZINE) 10 MG tablet Take 1 tablet (10 mg total) by mouth 2 (two) times daily as needed (Headache).  . rosuvastatin (CRESTOR) 20 MG tablet Take 1 tablet (20 mg total) by mouth daily at 6 PM.  . traMADol (ULTRAM) 50 MG tablet Take 1 tablet by mouth every 6 (six) hours as needed.  . Na Sulfate-K Sulfate-Mg Sulf 17.5-3.13-1.6 GM/177ML SOLN Take 1 kit by mouth once for 1 dose.   No facility-administered  encounter medications on file as of 02/13/2019.    Allergies  Allergen Reactions  . Codeine Anaphylaxis  . Oxycontin [Oxycodone Hcl] Shortness Of Breath and Itching    Through IV- has since had oral oxycodone without issue  . Contrast Media [Iodinated Diagnostic Agents]     Patient got very nauseated. Gave '4mg'$  Zofran. Possibly pre medicate with Zofran   . Morphine And Related Itching   Patient Active Problem List   Diagnosis Date Noted  . Cerebral thrombosis with cerebral infarction 06/28/2018  . Hypernatremia 06/27/2018  . CKD (chronic kidney disease), stage III ([place]) 06/27/2018  . Acute respiratory failure with hypoxia ([place])   . Seizure ([place]) 06/20/2018  . Acute encephalopathy 06/20/2018  . Asymptomatic carotid artery stenosis without infarction, right 12/25/2017  . Pre-operative cardiovascular examination 11/03/2017  . Carotid artery disease ([place]) 11/03/2017  . Shoulder joint pain 09/17/2013  . Numbness around mouth 12/01/2011  . Erectile dysfunction 09/05/2010  . TRIGGER FINGER, LEFT MIDDLE 04/20/2010  . ANAL FISSURE 04/03/2009  . Mixed dyslipidemia 04/01/2009  . RECTAL BLEEDING 04/01/2009  . ABDOMINAL PAIN-LLQ 04/01/2009  . ABDOMINAL PAIN, GENERALIZED 03/30/2009  . OTITIS EXTERNA, LEFT 05/07/2008  . BRONCHITIS, ACUTE 04/21/2008  . CARPAL TUNNEL SYNDROME, BILATERAL 12/25/2007  . COLD SORE 03/12/2007  . CAD (coronary artery disease) 01/01/2007  . OBESITY NOS 06/19/2006  . Essential hypertension 06/19/2006  . MYOCARDIAL INFARCTION, HX OF 06/19/2006  . COLONIC POLYPS, HX OF 06/19/2006   Social History   Socioeconomic History  . Marital status: Divorced    Spouse name: Not on file  . Number of children: Not on file  . Years of education: Not on file  . Highest education level: Not on file  Occupational History  . Occupation: retired    Comment: Building control surveyor  Social Needs  . Financial resource strain: Not on file  . Food insecurity    Worry: Not on file    Inability:  Not on file  . Transportation needs    Medical: Not on file    Non-medical: Not on file  Tobacco Use  . Smoking status: Former Smoker    Quit date: 05/23/1988    Years since quitting: 30.7  . Smokeless tobacco: Never Used  . Tobacco comment: smoked 2 packs per day for 20 years  Substance and Sexual Activity  . Alcohol use: No  . Drug use: No  . Sexual activity: Not on file  Lifestyle  . Physical activity    Days per week: Not on file    Minutes per session: Not on file  . Stress: Not on file  Relationships  . Social Herbalist on phone: Not on file    Gets together: Not on file    Attends religious service: Not on file    Active member of club or organization: Not on file    Attends meetings of clubs  or organizations: Not on file    Relationship status: Not on file  . Intimate partner violence    Fear of current or ex partner: Not on file    Emotionally abused: Not on file    Physically abused: Not on file    Forced sexual activity: Not on file  Other Topics Concern  . Not on file  Social History Narrative  . Not on file    Mr. Tierno family history includes Coronary artery disease in his brother; Coronary artery disease (age of onset: 46) in his mother; Hypertension in his mother; Other (age of onset: 43) in his father; Other (age of onset: 26) in his sister; Skin cancer in his brother.      Objective:    Vitals:   02/13/19 1126  BP: 132/76  Pulse: 64  Temp: 98.1 F (36.7 C)    Physical Exam Well-developed well-nourished older white male in no acute distress,, pleasant accompanied by his wife  Weight, 211 BMI 32.2  HEENT; nontraumatic normocephalic, EOMI, PE RR LA, sclera anicteric. Oropharynx not examined/mask/COVID; Neck; supple, no JVD Cardiovascular; regular rate and rhythm with S1-S2, no murmur rub or gallop Pulmonary; Clear bilaterally Abdomen; soft, nontender, nondistended, no palpable mass or hepatosplenomegaly, bowel sounds are active  abdomen somewhat full feeling no definite fluid wave, some palpable loops of bowel Rectal; not done today Skin; benign exam, no jaundice rash or appreciable lesions Extremities; no clubbing cyanosis or edema skin warm and dry Neuro/Psych; alert and oriented x4, grossly nonfocal mood and affect appropriate       Assessment & Plan:   #53 73 year old white male with 2 to 63-monthhistory of new early satiety, mild weight loss, nausea increased fatigue, heartburn and indigestion. In the background of this he has had intermittent episodes over the past few years of what he describes as dark tarry stools that may last for a few days.  These episodes occur every 4 to 6 weeks or so and are usually associated with pain in his mid and low abdomen.  Last episode was 2 weeks ago.  The etiology of current symptoms is not clear.  Certainly need to rule out upper GI sources for intermittent GI bleeding in setting of chronic Plavix use and NSAID use..  Rule out peptic ulcer disease, rule out neoplasm, rule out partial outlet obstruction. Also consider intermittent lower GI blood loss, no malignancy noted on recent noncontrasted CT scan, however last colonoscopy was 10 years ago.  #2 tonic antiplatelet therapy-on Plavix #3 history of CVA January 2020 left occipital #4 history of carotid stenosis #5 coronary artery disease status post MI and remote stents x3 #6 left ventricular dysfunction with EF 40 to 45% #7 chronic kidney disease #8 hypertension #9 history of hyperplastic colon polyp 2010  Plan; continue Protonix 40 mg p.o. every morning Advised patient to stop Voltaren/diclofenac and use Ultram as needed for shoulder pain. Patient will be scheduled for colonoscopy and EGD with Dr. PHenrene Pastor  Both procedures were discussed in detail with the patient and his wife including indications risks and benefits and he is agreeable to proceed. Patient will need to hold Plavix for 5 days prior to procedure, procedure  can be done on the fifth day and he may take a baby aspirin a days he is off Plavix.  We will communicate with his  PCP Dr. HNevada Craneto assure this is reasonable for this patient. Further recommendations pending findings of above.  Malcolm Quast S Jamorian Dimaria PA-C 02/13/2019  Cc: Celene Squibb, MD

## 2019-02-14 ENCOUNTER — Encounter: Payer: Self-pay | Admitting: Internal Medicine

## 2019-02-14 ENCOUNTER — Telehealth: Payer: Self-pay

## 2019-02-14 NOTE — Telephone Encounter (Signed)
Pt is taking Plavix Not sure when it was/wife thinks it was in 1990 by Dr. Lia Foyer Pt started walking on Monday 9/21 Walked to end of road and back. Pt is very mobile. According to wife pt is very active/works in yard a lot and works in his shop a lot. "He never sits still".

## 2019-02-14 NOTE — Telephone Encounter (Signed)
[facility] Pre-operative Risk Assessment     Request for surgical clearance:     Endoscopy Procedure  What type of surgery is being performed?     EGD/Colonoscopy   When is this surgery scheduled?     02-22-2019  What type of clearance is required ?   Pharmacy  Are there any medications that need to be held prior to surgery and how long? Yes, Plavix, 5 days  Practice name and name of physician performing surgery?      [facility]  What is your office phone and fax number?      Phone- [phone]  [phone]  Anesthesia type (None, local, MAC, general) ?       MAC

## 2019-02-14 NOTE — Telephone Encounter (Signed)
We checked with the patient.  Patient has excellent effort tolerance.  He is on Plavix for a very long period of time.  Yes it is fine to hold the Plavix for the a forementioned.  Which is approximately 5 days.  Patient needs to be on an enteric-coated 81 mg of aspirin without interruption.  Please get in touch with [place] if you have any other questions and need any more assistance.

## 2019-02-14 NOTE — Telephone Encounter (Signed)
   Primary Cardiologist: Jenean Lindau, MD  Chart reviewed as part of pre-operative protocol coverage. Patient was contacted 02/14/2019 in reference to pre-operative risk assessment for pending surgery as outlined below.  DEZMEN GEST was last seen on 02/11/19 by Dr. Geraldo Pitter.  Since that day, ERBY CANSECO has done well.  Per Dr. Geraldo Pitter: Patient has excellent effort tolerance.  He is on Plavix for a very long period of time.  OK to hold plavix for 5 days prior to procedure. Patient needs to be on an enteric-coated 81 mg of aspirin without interruption.   Therefore, based on ACC/AHA guidelines, the patient would be at acceptable risk for the planned procedure without further cardiovascular testing.   I will route this recommendation to the requesting party via Epic fax function and remove from pre-op pool.  Please call with questions.  Ledora Bottcher, PA 02/14/2019, 4:38 PM

## 2019-02-14 NOTE — Telephone Encounter (Signed)
Dr. Geraldo Pitter, We have received clearance request for EGD/colonoscopy and holding plavix. You saw this patient on 02/11/19. Is he medically cleared for surgery? Can you comment on holding plavix? And for how long?  Thanks Angie

## 2019-02-14 NOTE — Telephone Encounter (Signed)
I tried to reach the patient but it went into voicemail.  Please check for me if he is taking Plavix now.  He has had bleeding through his stools.  Also I would like to know when he is last heart catheterization and stenting was.  Also please check with him what his effort tolerance is and whether he walks on a regular basis.

## 2019-02-14 NOTE — Telephone Encounter (Signed)
Left message for patient to call office concerning cardiac clearance.

## 2019-02-14 NOTE — Progress Notes (Signed)
Physician assistant assessment and plan reviewed.  Complex patient. Amy, please be certain that it is okay for him to come off Plavix for 5 days given his history of CVA 8 months ago.  If not, I am happy to perform the examinations on Plavix.  Thank you for addressing this issue. Dr. Henrene Pastor

## 2019-02-15 NOTE — Telephone Encounter (Signed)
Patient has been notified and aware. He states clear understanding to hold Plavix 5 days prior but to continue the ASA 81mg .

## 2019-02-18 ENCOUNTER — Telehealth: Payer: Self-pay

## 2019-02-18 NOTE — Telephone Encounter (Signed)
-----   Message from Jenean Lindau, MD sent at 02/12/2019 10:15 AM EDT ----- The results of the study is unremarkable. Please inform patient.  Please congratulate him as he has done an exceptional job with his triglycerides and diet.  I will discuss in detail at next appointment. Cc  primary care/referring physician Jenean Lindau, MD 02/12/2019 10:14 AM

## 2019-02-18 NOTE — Telephone Encounter (Signed)
Results relayed, copy to Dr.Sweatt

## 2019-02-21 ENCOUNTER — Telehealth: Payer: Self-pay

## 2019-02-21 NOTE — Telephone Encounter (Signed)
Covid-19 screening questions   Do you now or have you had a fever in the last 14 days?  Do you have any respiratory symptoms of shortness of breath or cough now or in the last 14 days?  Do you have any family members or close contacts with diagnosed or suspected Covid-19 in the past 14 days?  Have you been tested for Covid-19 and found to be positive?       

## 2019-02-22 ENCOUNTER — Ambulatory Visit (AMBULATORY_SURGERY_CENTER): Payer: PPO | Admitting: Internal Medicine

## 2019-02-22 ENCOUNTER — Other Ambulatory Visit: Payer: Self-pay

## 2019-02-22 ENCOUNTER — Encounter: Payer: Self-pay | Admitting: Internal Medicine

## 2019-02-22 VITALS — BP 126/60 | HR 58 | Temp 98.5°F | Resp 14 | Ht 68.0 in | Wt 211.0 lb

## 2019-02-22 DIAGNOSIS — Z860100 Personal history of colon polyps, unspecified: Secondary | ICD-10-CM

## 2019-02-22 DIAGNOSIS — K921 Melena: Secondary | ICD-10-CM

## 2019-02-22 DIAGNOSIS — R110 Nausea: Secondary | ICD-10-CM

## 2019-02-22 DIAGNOSIS — R6881 Early satiety: Secondary | ICD-10-CM | POA: Diagnosis not present

## 2019-02-22 DIAGNOSIS — R1033 Periumbilical pain: Secondary | ICD-10-CM

## 2019-02-22 DIAGNOSIS — R195 Other fecal abnormalities: Secondary | ICD-10-CM

## 2019-02-22 MED ORDER — SODIUM CHLORIDE 0.9 % IV SOLN: 500.0000 mL | Freq: Once | INTRAVENOUS | Status: DC

## 2019-02-22 NOTE — Progress Notes (Signed)
Pt's states no medical or surgical changes since previsit or office visit.  Temp KA  Vitals Fries

## 2019-02-22 NOTE — Op Note (Signed)
[facility] Patient Name: Jeremy [name] Procedure Date: 02/22/2019 3:12 PM MRN: [id number] Endoscopist: Docia Chuck. Henrene Pastor , MD Age: 73 Referring MD:  Date of Birth: 04-15-1946 Gender: Male Account #: [id number] Procedure:                Upper GI endoscopy Indications:              Early satiety, Weight loss, dark stools Medicines:                Monitored Anesthesia Care Procedure:                Pre-Anesthesia Assessment:                           - Prior to the procedure, a History and Physical                            was performed, and patient medications and                            allergies were reviewed. The patient's tolerance of                            previous anesthesia was also reviewed. The risks                            and benefits of the procedure and the sedation                            options and risks were discussed with the patient.                            All questions were answered, and informed consent                            was obtained. Prior Anticoagulants: The patient has                            taken Plavix (clopidogrel), last dose was 6 days                            prior to procedure. ASA Grade Assessment: III - A                            patient with severe systemic disease. After                            reviewing the risks and benefits, the patient was                            deemed in satisfactory condition to undergo the                            procedure.  After obtaining informed consent, the endoscope was                            passed under direct vision. Throughout the                            procedure, the patient's blood pressure, pulse, and                            oxygen saturations were monitored continuously. The                            Endoscope was introduced through the mouth, and                            advanced to the second part of duodenum. The upper                          GI endoscopy was accomplished without difficulty.                            The patient tolerated the procedure well. Scope In: Scope Out: Findings:                 The esophagus was normal.                           The stomach was normal.                           The examined duodenum was normal.                           The cardia and gastric fundus were normal on                            retroflexion. Complications:            No immediate complications. Estimated Blood Loss:     Estimated blood loss: none. Impression:               - Normal esophagus.                           - Normal stomach.                           - Normal examined duodenum.                           - No specimens collected. Recommendation:           - Patient has a contact number available for                            emergencies. The signs and symptoms of potential  delayed complications were discussed with the                            patient. Return to normal activities tomorrow.                            Written discharge instructions were provided to the                            patient.                           - Resume previous diet.                           - Continue present medications.                           - Resume Plavix today                           - Return to the care of your primary provider Docia Chuck. Henrene Pastor, MD 02/22/2019 3:45:56 PM This report has been signed electronically.

## 2019-02-22 NOTE — Patient Instructions (Signed)
RESUME Plavix today.   YOU HAD AN ENDOSCOPIC PROCEDURE TODAY AT [facility]:   Refer to the procedure report that was given to you for any specific questions about what was found during the examination.  If the procedure report does not answer your questions, please call your gastroenterologist to clarify.  If you requested that your care partner not be given the details of your procedure findings, then the procedure report has been included in a sealed envelope for you to review at your convenience later.  YOU SHOULD EXPECT: Some feelings of bloating in the abdomen. Passage of more gas than usual.  Walking can help get rid of the air that was put into your GI tract during the procedure and reduce the bloating. If you had a lower endoscopy (such as a colonoscopy or flexible sigmoidoscopy) you may notice spotting of blood in your stool or on the toilet paper. If you underwent a bowel prep for your procedure, you may not have a normal bowel movement for a few days.  Please Note:  You might notice some irritation and congestion in your nose or some drainage.  This is from the oxygen used during your procedure.  There is no need for concern and it should clear up in a day or so.  SYMPTOMS TO REPORT IMMEDIATELY:   Following lower endoscopy (colonoscopy or flexible sigmoidoscopy):  Excessive amounts of blood in the stool  Significant tenderness or worsening of abdominal pains  Swelling of the abdomen that is new, acute  Fever of 100F or higher   Following upper endoscopy (EGD)  Vomiting of blood or coffee ground material  New chest pain or pain under the shoulder blades  Painful or persistently difficult swallowing  New shortness of breath  Fever of 100F or higher  Black, tarry-looking stools  For urgent or emergent issues, a gastroenterologist can be reached at any hour by calling [phone].   DIET:  We do recommend a small meal at first, but then you may proceed to  your regular diet.  Drink plenty of fluids but you should avoid alcoholic beverages for 24 hours.  ACTIVITY:  You should plan to take it easy for the rest of today and you should NOT DRIVE or use heavy machinery until tomorrow (because of the sedation medicines used during the test).    FOLLOW UP: Our staff will call the number listed on your records 48-72 hours following your procedure to check on you and address any questions or concerns that you may have regarding the information given to you following your procedure. If we do not reach you, we will leave a message.  We will attempt to reach you two times.  During this call, we will ask if you have developed any symptoms of COVID 19. If you develop any symptoms (ie: fever, flu-like symptoms, shortness of breath, cough etc.) before then, please call [phone].  If you test positive for Covid 19 in the 2 weeks post procedure, please call and report this information to [place].    If any biopsies were taken you will be contacted by phone or by letter within the next 1-3 weeks.  Please call us at [phone] if you have not heard about the biopsies in 3 weeks.    SIGNATURES/CONFIDENTIALITY: You and/or your care partner have signed paperwork which will be entered into your electronic medical record.  These signatures attest to the fact that that the information above on your After Visit Summary  has been reviewed and is understood.  Full responsibility of the confidentiality of this discharge information lies with you and/or your care-partner.

## 2019-02-22 NOTE — Progress Notes (Signed)
PT taken to PACU. Monitors in place. VSS. Report given to RN. 

## 2019-02-22 NOTE — Op Note (Signed)
[name]: Jeremy [name] Procedure Date: 02/22/2019 3:12 PM MRN: [id number] Endoscopist: Docia Chuck. Jeremy [name] , MD Age: 73 Referring MD:  Date of Birth: 11/24/1945 Gender: Male Account #: [id number] Procedure:                Colonoscopy Indications:              High risk colon cancer surveillance: Personal                            history of colonic polyps, Incidental - Melena.                            Previous examinations 2001, 2005, 2010 Medicines:                Monitored Anesthesia Care Procedure:                Pre-Anesthesia Assessment:                           - Prior to the procedure, a History and Physical                            was performed, and patient medications and                            allergies were reviewed. The patient's tolerance of                            previous anesthesia was also reviewed. The risks                            and benefits of the procedure and the sedation                            options and risks were discussed with the patient.                            All questions were answered, and informed consent                            was obtained. Prior Anticoagulants: The patient has                            taken Plavix (clopidogrel), last dose was 6 days                            prior to procedure. ASA Grade Assessment: III - A                            patient with severe systemic disease. After                            reviewing the risks and benefits, the patient was  deemed in satisfactory condition to undergo the                            procedure.                           After obtaining informed consent, the colonoscope                            was passed under direct vision. Throughout the                            procedure, the patient's blood pressure, pulse, and                            oxygen saturations were monitored continuously. The               Colonoscope was introduced through the anus and                            advanced to the the cecum, identified by                            appendiceal orifice and ileocecal valve. The                            ileocecal valve, appendiceal orifice, and rectum                            were photographed. The quality of the bowel                            preparation was excellent. The colonoscopy was                            performed without difficulty. The patient tolerated                            the procedure well. The bowel preparation used was                            SUPREP via split dose instruction. Scope In: 3:26:06 PM Scope Out: 3:33:13 PM Scope Withdrawal Time: 0 hours 5 minutes 42 seconds  Total Procedure Duration: 0 hours 7 minutes 7 seconds  Findings:                 The entire examined colon appeared normal on direct                            and retroflexion views. Complications:            No immediate complications. Estimated blood loss:                            None. Estimated Blood Loss:     Estimated blood loss: none. Impression:               -  The entire examined colon is normal on direct and                            retroflexion views.                           - No specimens collected. Recommendation:           - Repeat colonoscopy is not recommended for                            surveillance.                           - Patient has a contact number available for                            emergencies. The signs and symptoms of potential                            delayed complications were discussed with the                            patient. Return to normal activities tomorrow.                            Written discharge instructions were provided to the                            patient.                           - Resume previous diet.                           - Continue present medications.                           -  Resume Plavix today                           - EGD today. Please see report Docia Chuck. Jeremy Pastor, MD 02/22/2019 3:38:33 PM This report has been signed electronically.

## 2019-02-22 NOTE — Telephone Encounter (Signed)
Pt returned call and answered “No” to all questions.  ° °

## 2019-02-26 ENCOUNTER — Telehealth: Payer: Self-pay | Admitting: *Deleted

## 2019-02-26 NOTE — Telephone Encounter (Signed)
1. Have you developed a fever since your procedure? no  2.   Have you had an respiratory symptoms (SOB or cough) since your procedure? no  3.   Have you tested positive for COVID 19 since your procedure no  4.   Have you had any family members/close contacts diagnosed with the COVID 19 since your procedure?  no   If yes to any of these questions please route to Joylene John, RN and Alphonsa Gin, Therapist, sports.  Follow up Call-  Call back number 02/22/2019  Post procedure Call Back phone  # [phone]  Permission to leave phone message Yes  Some recent data might be hidden     Patient questions:  Do you have a fever, pain , or abdominal swelling? No. Pain Score  0 *  Have you tolerated food without any problems? Yes.    Have you been able to return to your normal activities? Yes.    Do you have any questions about your discharge instructions: Diet   No. Medications  No. Follow up visit  No.  Do you have questions or concerns about your Care? No.  Actions: * If pain score is 4 or above: No action needed, pain <4.

## 2019-02-28 DIAGNOSIS — I2510 Atherosclerotic heart disease of native coronary artery without angina pectoris: Secondary | ICD-10-CM | POA: Diagnosis not present

## 2019-02-28 DIAGNOSIS — E782 Mixed hyperlipidemia: Secondary | ICD-10-CM | POA: Diagnosis not present

## 2019-02-28 DIAGNOSIS — E1122 Type 2 diabetes mellitus with diabetic chronic kidney disease: Secondary | ICD-10-CM | POA: Diagnosis not present

## 2019-02-28 DIAGNOSIS — I10 Essential (primary) hypertension: Secondary | ICD-10-CM | POA: Diagnosis not present

## 2019-03-06 DIAGNOSIS — M19011 Primary osteoarthritis, right shoulder: Secondary | ICD-10-CM | POA: Diagnosis not present

## 2019-03-06 DIAGNOSIS — Z01818 Encounter for other preprocedural examination: Secondary | ICD-10-CM | POA: Diagnosis not present

## 2019-03-07 ENCOUNTER — Other Ambulatory Visit (HOSPITAL_COMMUNITY): Payer: Self-pay | Admitting: General Practice

## 2019-03-07 ENCOUNTER — Telehealth: Payer: Self-pay | Admitting: Cardiology

## 2019-03-07 ENCOUNTER — Other Ambulatory Visit: Payer: Self-pay | Admitting: General Practice

## 2019-03-07 DIAGNOSIS — M19011 Primary osteoarthritis, right shoulder: Secondary | ICD-10-CM

## 2019-03-07 NOTE — Telephone Encounter (Signed)
Patient needs clearance for shoulder surgery faxed to [phone] to Dr.

## 2019-03-11 NOTE — Telephone Encounter (Signed)
Patient has been seen on 02/11/19 but no notes listed concerning cardiac clearance. Note sent to Dr. Docia Furl for review.

## 2019-03-12 ENCOUNTER — Other Ambulatory Visit: Payer: Self-pay

## 2019-03-12 ENCOUNTER — Ambulatory Visit (HOSPITAL_COMMUNITY)
Admission: RE | Admit: 2019-03-12 | Discharge: 2019-03-12 | Disposition: A | Discharge status: Home / Self Care | Payer: PPO | Source: Ambulatory Visit | Attending: General Practice | Admitting: General Practice

## 2019-03-12 DIAGNOSIS — M19011 Primary osteoarthritis, right shoulder: Secondary | ICD-10-CM | POA: Diagnosis not present

## 2019-03-14 NOTE — Telephone Encounter (Signed)
I reviewed his chart.  He will need appointment with me for this.

## 2019-03-18 ENCOUNTER — Telehealth: Payer: Self-pay | Admitting: Cardiology

## 2019-03-18 NOTE — Telephone Encounter (Signed)
Please call wife she states that Dr Gracy Racer saw him for Pre-op in September and that he told him that day he was clear... Please call wife.Marland Kitchen

## 2019-03-19 ENCOUNTER — Telehealth (HOSPITAL_COMMUNITY): Payer: Self-pay | Admitting: *Deleted

## 2019-03-19 ENCOUNTER — Encounter: Payer: Self-pay | Admitting: Cardiology

## 2019-03-19 ENCOUNTER — Encounter (HOSPITAL_COMMUNITY): Payer: PPO

## 2019-03-19 ENCOUNTER — Other Ambulatory Visit: Payer: Self-pay

## 2019-03-19 ENCOUNTER — Ambulatory Visit (INDEPENDENT_AMBULATORY_CARE_PROVIDER_SITE_OTHER): Payer: PPO | Admitting: Cardiology

## 2019-03-19 VITALS — BP 136/62 | HR 47 | Ht 67.0 in | Wt 212.0 lb

## 2019-03-19 DIAGNOSIS — E782 Mixed hyperlipidemia: Secondary | ICD-10-CM

## 2019-03-19 DIAGNOSIS — I2510 Atherosclerotic heart disease of native coronary artery without angina pectoris: Secondary | ICD-10-CM

## 2019-03-19 DIAGNOSIS — Z01810 Encounter for preprocedural cardiovascular examination: Secondary | ICD-10-CM | POA: Diagnosis not present

## 2019-03-19 DIAGNOSIS — Z01818 Encounter for other preprocedural examination: Secondary | ICD-10-CM

## 2019-03-19 NOTE — Patient Instructions (Addendum)
Medication Instructions:  Your physician recommends that you continue on your current medications as directed. Please refer to the Current Medication list given to you today.  If you need a refill on your cardiac medications before your next appointment, please call your pharmacy.   Lab work: NONE If you have labs (blood work) drawn today and your tests are completely normal, you will receive your results only by: Marland Kitchen MyChart Message (if you have MyChart) OR . A paper copy in the mail If you have any lab test that is abnormal or we need to change your treatment, we will call you to review the results.  Testing/Procedures: You had an EKG performed today.  Your physician has requested that you have a lexiscan myoview. For further information please visit [email or web address]. Please follow instruction sheet, as given.    Follow-Up: At [facility], you and your health needs are our priority.  As part of our continuing mission to provide you with exceptional heart care, we have created designated Provider Care Teams.  These Care Teams include your primary Cardiologist (physician) and Advanced Practice Providers (APPs -  Physician Assistants and Nurse Practitioners) who all work together to provide you with the care you need, when you need it. You will need a follow up appointment in 4 months.   Any Other Special Instructions Will Be Listed Below  Regadenoson injection What is this medicine? REGADENOSON is used to test the heart for coronary artery disease. It is used in patients who can not exercise for their stress test. This medicine may be used for other purposes; ask your health care provider or pharmacist if you have questions. COMMON BRAND NAME(S): Lexiscan What should I tell my health care provider before I take this medicine? They need to know if you have any of these conditions:  heart problems  lung or breathing disease, like asthma or COPD  an unusual or allergic reaction  to regadenoson, other medicines, foods, dyes, or preservatives  pregnant or trying to get pregnant  breast-feeding How should I use this medicine? This medicine is for injection into a vein. It is given by a health care professional in a hospital or clinic setting. Talk to your pediatrician regarding the use of this medicine in children. Special care may be needed. Overdosage: If you think you have taken too much of this medicine contact a poison control center or emergency room at once. NOTE: This medicine is only for you. Do not share this medicine with others. What if I miss a dose? This does not apply. What may interact with this medicine?  caffeine  dipyridamole  guarana  theophylline This list may not describe all possible interactions. Give your health care provider a list of all the medicines, herbs, non-prescription drugs, or dietary supplements you use. Also tell them if you smoke, drink alcohol, or use illegal drugs. Some items may interact with your medicine. What should I watch for while using this medicine? Your condition will be monitored carefully while you are receiving this medicine. Do not take medicines, foods, or drinks with caffeine (like coffee, tea, or colas) for at least 12 hours before your test. If you do not know if something contains caffeine, ask your health care professional. What side effects may I notice from receiving this medicine? Side effects that you should report to your doctor or health care professional as soon as possible:  allergic reactions like skin rash, itching or hives, swelling of the face, lips, or tongue  breathing  problems  chest pain, tightness or palpitations  severe headache Side effects that usually do not require medical attention (report to your doctor or health care professional if they continue or are bothersome):  flushing  headache  irritation or pain at site where injected  nausea, vomiting This list may not  describe all possible side effects. Call your doctor for medical advice about side effects. You may report side effects to FDA at 1-800-FDA-1088. Where should I keep my medicine? This drug is given in a hospital or clinic and will not be stored at home. NOTE: This sheet is a summary. It may not cover all possible information. If you have questions about this medicine, talk to your doctor, pharmacist, or health care provider.  2020 Elsevier/Gold Standard (2008-01-07 15:08:13)  Cardiac Nuclear Scan A cardiac nuclear scan is a test that is done to check the flow of blood to your heart. It is done when you are resting and when you are exercising. The test looks for problems such as:  Not enough blood reaching a portion of the heart.  The heart muscle not working as it should. You may need this test if:  You have heart disease.  You have had lab results that are not normal.  You have had heart surgery or a balloon procedure to open up blocked arteries (angioplasty).  You have chest pain.  You have shortness of breath. In this test, a special dye (tracer) is put into your bloodstream. The tracer will travel to your heart. A camera will then take pictures of your heart to see how the tracer moves through your heart. This test is usually done at a hospital and takes 2-4 hours. Tell a doctor about:  Any allergies you have.  All medicines you are taking, including vitamins, herbs, eye drops, creams, and over-the-counter medicines.  Any problems you or family members have had with anesthetic medicines.  Any blood disorders you have.  Any surgeries you have had.  Any medical conditions you have.  Whether you are pregnant or may be pregnant. What are the risks? Generally, this is a safe test. However, problems may occur, such as:  Serious chest pain and heart attack. This is only a risk if the stress portion of the test is done.  Rapid heartbeat.  A feeling of warmth in your chest.  This feeling usually does not last long.  Allergic reaction to the tracer. What happens before the test?  Ask your doctor about changing or stopping your normal medicines. This is important.  Follow instructions from your doctor about what you cannot eat or drink.  Remove your jewelry on the day of the test. What happens during the test?  An IV tube will be inserted into one of your veins.  Your doctor will give you a small amount of tracer through the IV tube.  You will wait for 20-40 minutes while the tracer moves through your bloodstream.  Your heart will be monitored with an electrocardiogram (ECG).  You will lie down on an exam table.  Pictures of your heart will be taken for about 15-20 minutes.  You may also have a stress test. For this test, one of these things may be done: ? You will be asked to exercise on a treadmill or a stationary bike. ? You will be given medicines that will make your heart work harder. This is done if you are unable to exercise.  When blood flow to your heart has peaked, a tracer  tracer will again be given through the IV tube.  After 20-40 minutes, you will get back on the exam table. More pictures will be taken of your heart.  Depending on the tracer that is used, more pictures may need to be taken 3-4 hours later.  Your IV tube will be removed when the test is over. The test may vary among doctors and hospitals. What happens after the test?  Ask your doctor: ? Whether you can return to your normal schedule, including diet, activities, and medicines. ? Whether you should drink more fluids. This will help to remove the tracer from your body. Drink enough fluid to keep your pee (urine) pale yellow.  Ask your doctor, or the department that is doing the test: ? When will my results be ready? ? How will I get my results? Summary  A cardiac nuclear scan is a test that is done to check the flow of blood to your heart.  Tell your doctor  whether you are pregnant or may be pregnant.  Before the test, ask your doctor about changing or stopping your normal medicines. This is important.  Ask your doctor whether you can return to your normal activities. You may be asked to drink more fluids. This information is not intended to replace advice given to you by your health care provider. Make sure you discuss any questions you have with your health care provider. Document Released: 10/23/2017 Document Revised: 08/29/2018 Document Reviewed: 10/23/2017 Elsevier Patient Education  2020 Elsevier Inc.  

## 2019-03-19 NOTE — Telephone Encounter (Signed)
Follow Up  Patient is returning call. Please give patient/patient's wife a call back.  

## 2019-03-19 NOTE — Telephone Encounter (Signed)
Patient 's wife called back. She was per DPRgiven detailed instructions per Myocardial Perfusion Study Information Sheet for the test on 03/20/2019 at [place]. Patient notified to arrive 15 minutes early and that it is imperative to arrive on time for appointment to keep from having the test rescheduled.  If you need to cancel or reschedule your appointment, please call the office within 24 hours of your appointment. . Patient verbalized understanding.Latroya Ng, Ranae Palms

## 2019-03-19 NOTE — Telephone Encounter (Signed)
Left message on voicemail per DPR in reference to upcoming appointment scheduled on 03/20/2019 at 0715 with detailed instructions given per Myocardial Perfusion Study Information Sheet for the test. LM to arrive 15 minutes early, and that it is imperative to arrive on time for appointment to keep from having the test rescheduled. If you need to cancel or reschedule your appointment, please call the office within 24 hours of your appointment. Failure to do so may result in a cancellation of your appointment, and a $50 no show fee. Phone number given for call back for any questions.   No mychart available. Farrel Guimond, Ranae Palms

## 2019-03-19 NOTE — Progress Notes (Signed)
Cardiology Office Note:    Date:  03/19/2019   ID:  TAMIKO ELLINGTON, DOB [birth year], MRN [id number]  PCP:  Celene Squibb, MD  Cardiologist:  Jenean Lindau, MD   Referring MD: Celene Squibb, MD    ASSESSMENT:    1. Pre-operative cardiovascular examination   2. Pre-op evaluation   3. Coronary artery disease involving native coronary artery of native heart without angina pectoris   4. Mixed dyslipidemia    PLAN:    In order of problems listed above:  1. Preop cardiovascular assessment: I discussed my findings with the patient at extensive length.  Patient leads a sedentary lifestyle and has coronary artery disease and moderately depressed ejection fraction therefore I recommend Lexiscan sestamibi.  If this is negative then he is not at high risk for coronary events during the aforementioned surgery.  Medically hemodynamic monitoring and uninterrupted perioperative beta-blockade will further reduce the risk of coronary events. 2. Essential hypertension: His blood pressure is stable and diet was discussed for him dyslipidemia and obesity.  He plans to do better.  I also told him to walk on a regular basis and he promises to do so. 3. Patient will be seen in follow-up appointment in 6 months or earlier if the patient has any concerns    Medication Adjustments/Labs and Tests Ordered: Current medicines are reviewed at length with the patient today.  Concerns regarding medicines are outlined above.  Orders Placed This Encounter  Procedures  . MYOCARDIAL PERFUSION IMAGING  . EKG 12-Lead   No orders of the defined types were placed in this encounter.    No chief complaint on file.    History of Present Illness:    Jeremy [name] is a 73 y.o. male.  Patient has past medical history of coronary artery disease, mildly depressed ejection fraction and history of stroke.  He has history of essential hypertension and dyslipidemia.  He denies any problems at this time and takes care of  activities of daily living.  No chest pain orthopnea or PND.  At the time of my evaluation, the patient is alert awake oriented and in no distress.  He does not exercise on a regular basis.  He is here for preop assessment for shoulder surgery.  Past Medical History:  Diagnosis Date  . Arthritis   . Carotid stenosis, asymptomatic, right   . Carpal tunnel syndrome    bilateral  . Coronary artery disease   . GERD (gastroesophageal reflux disease)   . History of kidney stones   . Hx of colonic polyp   . Hypercholesterolemia   . Hypertension   . Myocardial infarction ([place])    hx of  . Obesity   . Stroke [facility])     Past Surgical History:  Procedure Laterality Date  . CARDIAC CATHETERIZATION    . CORONARY STENT PLACEMENT  2000   x3   . CYST EXCISION     x 2  . ENDARTERECTOMY Right 12/25/2017   Procedure: ENDARTERECTOMY CAROTID RIGHT;  Surgeon: Rosetta Posner, MD;  Location: [place];  Service: Vascular;  Laterality: Right;  . PATCH ANGIOPLASTY Right 12/25/2017   Procedure: PATCH ANGIOPLASTY RIGHT CAROTID ARTERY;  Surgeon: Rosetta Posner, MD;  Location: [place];  Service: Vascular;  Laterality: Right;  . TEE WITHOUT CARDIOVERSION N/A 06/29/2018   Procedure: TRANSESOPHAGEAL ECHOCARDIOGRAM (TEE);  Surgeon: Fay Records, MD;  Location[facility] ENDOSCOPY;  Service: Cardiovascular;  Laterality: N/A;    Current Medications: Current Meds  Medication Sig  . carvedilol (COREG) 12.5 MG tablet Take 1 tablet (12.5 mg total) by mouth 2 (two) times daily with a meal.  . clopidogrel (PLAVIX) 75 MG tablet Take 1 tablet by mouth daily.  . enalapril (VASOTEC) 10 MG tablet Take 10 mg by mouth daily.  Marland Kitchen gabapentin (NEURONTIN) 300 MG capsule Take 1 capsule (300 mg total) by mouth 3 (three) times daily.  Marland Kitchen ketoconazole (NIZORAL) 2 % shampoo Apply 1 application topically 2 (two) times a week. AS NEEDED  . nitroGLYCERIN (NITROSTAT) 0.4 MG SL tablet Place 0.4 mg under the tongue every 5 (five) minutes as needed for chest  pain.   Marland Kitchen omeprazole (PRILOSEC) 40 MG capsule TAKE 1 CAPSULE BY MOUTH ONCE DAILY FOR REFLUX  . ondansetron (ZOFRAN ODT) 4 MG disintegrating tablet Take 1 tablet (4 mg total) by mouth every 8 (eight) hours as needed.  Marland Kitchen oxyCODONE-acetaminophen (PERCOCET) 7.5-325 MG tablet Take 1 tablet by mouth 3 (three) times daily as needed. for pain  . prochlorperazine (COMPAZINE) 10 MG tablet Take 1 tablet (10 mg total) by mouth 2 (two) times daily as needed (Headache).  . rosuvastatin (CRESTOR) 20 MG tablet Take 1 tablet (20 mg total) by mouth daily at 6 PM.  . traMADol (ULTRAM) 50 MG tablet Take 1 tablet by mouth every 6 (six) hours as needed.  . [DISCONTINUED] diclofenac (VOLTAREN) 75 MG EC tablet TAKE 1 TABLET BY MOUTH TWICE DAILY AS NEEDED FOR PAIN ONLY TAKE FOR 1 2 WEEKS ROUTINELY     Allergies:   Codeine, Oxycontin [oxycodone hcl], Contrast media [iodinated diagnostic agents], and Morphine and related   Social History   Socioeconomic History  . Marital status: Divorced    Spouse name: Not on file  . Number of children: Not on file  . Years of education: Not on file  . Highest education level: Not on file  Occupational History  . Occupation: retired    Comment: Building control surveyor  Social Needs  . Financial resource strain: Not on file  . Food insecurity    Worry: Not on file    Inability: Not on file  . Transportation needs    Medical: Not on file    Non-medical: Not on file  Tobacco Use  . Smoking status: Former Smoker    Quit date: 05/23/1988    Years since quitting: 30.8  . Smokeless tobacco: Never Used  . Tobacco comment: smoked 2 packs per day for 20 years  Substance and Sexual Activity  . Alcohol use: No  . Drug use: No  . Sexual activity: Not on file  Lifestyle  . Physical activity    Days per week: Not on file    Minutes per session: Not on file  . Stress: Not on file  Relationships  . Social Herbalist on phone: Not on file    Gets together: Not on file    Attends  religious service: Not on file    Active member of club or organization: Not on file    Attends meetings of clubs or organizations: Not on file    Relationship status: Not on file  Other Topics Concern  . Not on file  Social History Narrative  . Not on file     Family History: The patient's family history includes Coronary artery disease in his brother; Coronary artery disease (age of onset: 4) in his mother; Hypertension in his mother; Other (age of onset: 16) in his father; Other (age of onset: 56)  in his sister; Skin cancer in his brother. There is no history of Colon cancer, Pancreatic cancer, Stomach cancer, Esophageal cancer, or Liver disease.  ROS:   Please see the history of present illness.    All other systems reviewed and are negative.  EKGs/Labs/Other Studies Reviewed:    The following studies were reviewed today: IMPRESSIONS    1. The right ventricle has normal systolic function. The cavity was normal.  2. The mitral valve is normal in structure.  3. The tricuspid valve was normal in structure.  4. The aortic valve is normal in structure. Aortic valve regurgitation is trivial by color flow Doppler.  5. The pulmonic valve was normal in structure.  6. The interatrial septum appears to be lipomatous.  7. The left ventricle has mildly reduced systolic function of [id number].   Recent Labs: 06/22/2018: TSH 0.634 06/26/2018: Magnesium 2.3 01/30/2019: B Natriuretic Peptide 105.0 02/11/2019: ALT 30; BUN 30; Creatinine, Ser 1.53; Hemoglobin 13.1; Platelets 201; Potassium 4.9; Sodium 141  Recent Lipid Panel    Component Value Date/Time   CHOL 125 02/11/2019 0847   TRIG 83 02/11/2019 0847   HDL 45 02/11/2019 0847   CHOLHDL 2.8 02/11/2019 0847   CHOLHDL 4.9 06/23/2018 0626   VLDL 42 (H) 06/23/2018 0626   LDLCALC 64 02/11/2019 0847    Physical Exam:    VS:  BP 136/62 (BP Location: Left Arm, Patient Position: Sitting, Cuff Size: Normal)   Pulse (!) 47   Ht 5\' 7"  (1.702 m)    Wt 212 lb (96.2 kg)   SpO2 97%   BMI 33.20 kg/m     Wt Readings from Last 3 Encounters:  03/19/19 212 lb (96.2 kg)  02/22/19 211 lb (95.7 kg)  02/13/19 211 lb 12.8 oz (96.1 kg)     GEN: Patient is in no acute distress HEENT: Normal NECK: No JVD; No carotid bruits LYMPHATICS: No lymphadenopathy CARDIAC: Hear sounds regular, 2/6 systolic murmur at the apex. RESPIRATORY:  Clear to auscultation without rales, wheezing or rhonchi  ABDOMEN: Soft, non-tender, non-distended MUSCULOSKELETAL:  No edema; No deformity  SKIN: Warm and dry NEUROLOGIC:  Alert and oriented x 3 PSYCHIATRIC:  Normal affect   Signed, Jenean Lindau, MD  03/19/2019 12:00 PM    [place]

## 2019-03-20 ENCOUNTER — Ambulatory Visit (HOSPITAL_COMMUNITY): Payer: PPO | Attending: Cardiology

## 2019-03-20 VITALS — Ht 67.0 in | Wt 212.0 lb

## 2019-03-20 DIAGNOSIS — Z01818 Encounter for other preprocedural examination: Secondary | ICD-10-CM | POA: Insufficient documentation

## 2019-03-20 DIAGNOSIS — I2510 Atherosclerotic heart disease of native coronary artery without angina pectoris: Secondary | ICD-10-CM

## 2019-03-20 LAB — MYOCARDIAL PERFUSION IMAGING
LV dias vol: 105 mL (ref 62–150)
LV sys vol: 36 mL
Peak HR: 72 {beats}/min
Rest HR: 55 {beats}/min
SDS: 3
SRS: 1
SSS: 4
TID: 1.03

## 2019-03-20 MED ORDER — REGADENOSON 0.4 MG/5ML IV SOLN
0.4000 mg | Freq: Once | INTRAVENOUS | Status: AC
  Administered 2019-03-20: 0.4 mg via INTRAVENOUS

## 2019-03-20 MED ORDER — TECHNETIUM TC 99M TETROFOSMIN IV KIT
10.6000 | PACK | Freq: Once | INTRAVENOUS | Status: AC | PRN
  Administered 2019-03-20: 10.6 via INTRAVENOUS
  Filled 2019-03-20: qty 11

## 2019-03-20 MED ORDER — TECHNETIUM TC 99M TETROFOSMIN IV KIT
31.7000 | PACK | Freq: Once | INTRAVENOUS | Status: AC | PRN
  Administered 2019-03-20: 31.7 via INTRAVENOUS
  Filled 2019-03-20: qty 32

## 2019-03-21 ENCOUNTER — Telehealth: Payer: Self-pay

## 2019-03-21 NOTE — Telephone Encounter (Signed)
-----   Message from Rajan R Revankar, MD sent at 03/21/2019  8:41 AM EDT ----- The results of the study is unremarkable. Please inform patient. I will discuss in detail at next appointment. Cc  primary care/referring physician Rajan R Revankar, MD 03/21/2019 8:41 AM 

## 2019-03-21 NOTE — Telephone Encounter (Signed)
Results relayed, copy sent to Dr. Nevada Crane.

## 2019-03-22 DIAGNOSIS — M19011 Primary osteoarthritis, right shoulder: Secondary | ICD-10-CM | POA: Diagnosis not present

## 2019-03-22 DIAGNOSIS — Z20828 Contact with and (suspected) exposure to other viral communicable diseases: Secondary | ICD-10-CM | POA: Diagnosis not present

## 2019-03-22 DIAGNOSIS — Z01812 Encounter for preprocedural laboratory examination: Secondary | ICD-10-CM | POA: Diagnosis not present

## 2019-03-22 DIAGNOSIS — Z01818 Encounter for other preprocedural examination: Secondary | ICD-10-CM | POA: Diagnosis not present

## 2019-03-25 ENCOUNTER — Other Ambulatory Visit: Payer: Self-pay

## 2019-03-25 ENCOUNTER — Inpatient Hospital Stay (HOSPITAL_COMMUNITY)
Admission: EM | Admit: 2019-03-25 | Discharge: 2019-03-27 | DRG: 247 | Disposition: A | Discharge status: Home / Self Care | Payer: PPO | Attending: Cardiovascular Disease | Admitting: Cardiovascular Disease

## 2019-03-25 ENCOUNTER — Encounter (HOSPITAL_COMMUNITY): Payer: Self-pay

## 2019-03-25 ENCOUNTER — Emergency Department (HOSPITAL_COMMUNITY): Payer: PPO

## 2019-03-25 ENCOUNTER — Other Ambulatory Visit (HOSPITAL_COMMUNITY): Payer: Self-pay

## 2019-03-25 DIAGNOSIS — R079 Chest pain, unspecified: Secondary | ICD-10-CM

## 2019-03-25 DIAGNOSIS — E785 Hyperlipidemia, unspecified: Secondary | ICD-10-CM | POA: Diagnosis not present

## 2019-03-25 DIAGNOSIS — E669 Obesity, unspecified: Secondary | ICD-10-CM | POA: Diagnosis present

## 2019-03-25 DIAGNOSIS — I252 Old myocardial infarction: Secondary | ICD-10-CM | POA: Diagnosis not present

## 2019-03-25 DIAGNOSIS — I200 Unstable angina: Secondary | ICD-10-CM | POA: Diagnosis present

## 2019-03-25 DIAGNOSIS — K219 Gastro-esophageal reflux disease without esophagitis: Secondary | ICD-10-CM | POA: Diagnosis not present

## 2019-03-25 DIAGNOSIS — T82855A Stenosis of coronary artery stent, initial encounter: Secondary | ICD-10-CM | POA: Diagnosis not present

## 2019-03-25 DIAGNOSIS — Z885 Allergy status to narcotic agent status: Secondary | ICD-10-CM

## 2019-03-25 DIAGNOSIS — Z8673 Personal history of transient ischemic attack (TIA), and cerebral infarction without residual deficits: Secondary | ICD-10-CM | POA: Diagnosis not present

## 2019-03-25 DIAGNOSIS — N179 Acute kidney failure, unspecified: Secondary | ICD-10-CM | POA: Diagnosis present

## 2019-03-25 DIAGNOSIS — Z20828 Contact with and (suspected) exposure to other viral communicable diseases: Secondary | ICD-10-CM | POA: Diagnosis present

## 2019-03-25 DIAGNOSIS — R7989 Other specified abnormal findings of blood chemistry: Secondary | ICD-10-CM | POA: Diagnosis not present

## 2019-03-25 DIAGNOSIS — Z79891 Long term (current) use of opiate analgesic: Secondary | ICD-10-CM | POA: Diagnosis not present

## 2019-03-25 DIAGNOSIS — I129 Hypertensive chronic kidney disease with stage 1 through stage 4 chronic kidney disease, or unspecified chronic kidney disease: Secondary | ICD-10-CM | POA: Diagnosis not present

## 2019-03-25 DIAGNOSIS — M19011 Primary osteoarthritis, right shoulder: Secondary | ICD-10-CM | POA: Diagnosis present

## 2019-03-25 DIAGNOSIS — I2510 Atherosclerotic heart disease of native coronary artery without angina pectoris: Secondary | ICD-10-CM | POA: Diagnosis not present

## 2019-03-25 DIAGNOSIS — I10 Essential (primary) hypertension: Secondary | ICD-10-CM | POA: Diagnosis present

## 2019-03-25 DIAGNOSIS — M542 Cervicalgia: Secondary | ICD-10-CM | POA: Diagnosis not present

## 2019-03-25 DIAGNOSIS — N182 Chronic kidney disease, stage 2 (mild): Secondary | ICD-10-CM | POA: Diagnosis present

## 2019-03-25 DIAGNOSIS — Z87891 Personal history of nicotine dependence: Secondary | ICD-10-CM

## 2019-03-25 DIAGNOSIS — Y838 Other surgical procedures as the cause of abnormal reaction of the patient, or of later complication, without mention of misadventure at the time of the procedure: Secondary | ICD-10-CM | POA: Diagnosis present

## 2019-03-25 DIAGNOSIS — Z8249 Family history of ischemic heart disease and other diseases of the circulatory system: Secondary | ICD-10-CM

## 2019-03-25 DIAGNOSIS — Z79899 Other long term (current) drug therapy: Secondary | ICD-10-CM

## 2019-03-25 DIAGNOSIS — I739 Peripheral vascular disease, unspecified: Secondary | ICD-10-CM | POA: Diagnosis not present

## 2019-03-25 DIAGNOSIS — I214 Non-ST elevation (NSTEMI) myocardial infarction: Principal | ICD-10-CM | POA: Diagnosis present

## 2019-03-25 DIAGNOSIS — I25110 Atherosclerotic heart disease of native coronary artery with unstable angina pectoris: Secondary | ICD-10-CM | POA: Diagnosis not present

## 2019-03-25 DIAGNOSIS — R778 Other specified abnormalities of plasma proteins: Secondary | ICD-10-CM

## 2019-03-25 DIAGNOSIS — I447 Left bundle-branch block, unspecified: Secondary | ICD-10-CM | POA: Diagnosis not present

## 2019-03-25 DIAGNOSIS — Z6832 Body mass index (BMI) 32.0-32.9, adult: Secondary | ICD-10-CM

## 2019-03-25 DIAGNOSIS — E782 Mixed hyperlipidemia: Secondary | ICD-10-CM | POA: Diagnosis not present

## 2019-03-25 DIAGNOSIS — I249 Acute ischemic heart disease, unspecified: Secondary | ICD-10-CM | POA: Diagnosis not present

## 2019-03-25 DIAGNOSIS — N183 Chronic kidney disease, stage 3 unspecified: Secondary | ICD-10-CM | POA: Diagnosis present

## 2019-03-25 DIAGNOSIS — Z955 Presence of coronary angioplasty implant and graft: Secondary | ICD-10-CM | POA: Diagnosis not present

## 2019-03-25 DIAGNOSIS — Z7902 Long term (current) use of antithrombotics/antiplatelets: Secondary | ICD-10-CM

## 2019-03-25 DIAGNOSIS — Z91041 Radiographic dye allergy status: Secondary | ICD-10-CM

## 2019-03-25 DIAGNOSIS — R0789 Other chest pain: Secondary | ICD-10-CM | POA: Diagnosis not present

## 2019-03-25 LAB — CBC WITH DIFFERENTIAL/PLATELET
Abs Immature Granulocytes: 0.01 10*3/uL (ref 0.00–0.07)
Basophils Absolute: 0 10*3/uL (ref 0.0–0.1)
Basophils Relative: 0 %
Eosinophils Absolute: 0.1 10*3/uL (ref 0.0–0.5)
Eosinophils Relative: 2 %
HCT: 39.7 % (ref 39.0–52.0)
Hemoglobin: 13 g/dL (ref 13.0–17.0)
Immature Granulocytes: 0 %
Lymphocytes Relative: 8 %
Lymphs Abs: 0.7 10*3/uL (ref 0.7–4.0)
MCH: 30.2 pg (ref 26.0–34.0)
MCHC: 32.7 g/dL (ref 30.0–36.0)
MCV: 92.3 fL (ref 80.0–100.0)
Monocytes Absolute: 0.6 10*3/uL (ref 0.1–1.0)
Monocytes Relative: 8 %
Neutro Abs: 6.7 10*3/uL (ref 1.7–7.7)
Neutrophils Relative %: 82 %
Platelets: 175 10*3/uL (ref 150–400)
RBC: 4.3 MIL/uL (ref 4.22–5.81)
RDW: 12.9 % (ref 11.5–15.5)
WBC: 8.1 10*3/uL (ref 4.0–10.5)
nRBC: 0 % (ref 0.0–0.2)

## 2019-03-25 LAB — BASIC METABOLIC PANEL
Anion gap: 11 (ref 5–15)
BUN: 24 mg/dL — ABNORMAL HIGH (ref 8–23)
CO2: 24 mmol/L (ref 22–32)
Calcium: 9.4 mg/dL (ref 8.9–10.3)
Chloride: 102 mmol/L (ref 98–111)
Creatinine, Ser: 1.44 mg/dL — ABNORMAL HIGH (ref 0.61–1.24)
GFR calc Af Amer: 55 mL/min — ABNORMAL LOW (ref >60)
GFR calc non Af Amer: 48 mL/min — ABNORMAL LOW (ref >60)
Glucose, Bld: 131 mg/dL — ABNORMAL HIGH (ref 70–99)
Potassium: 4 mmol/L (ref 3.5–5.1)
Sodium: 137 mmol/L (ref 135–145)

## 2019-03-25 LAB — HEPARIN LEVEL (UNFRACTIONATED): Heparin Unfractionated: 0.27 IU/mL — ABNORMAL LOW (ref 0.30–0.70)

## 2019-03-25 LAB — TROPONIN I (HIGH SENSITIVITY)
Troponin I (High Sensitivity): 44 ng/L — ABNORMAL HIGH (ref <18)
Troponin I (High Sensitivity): 412 ng/L (ref <18)
Troponin I (High Sensitivity): 3138 ng/L (ref <18)
Troponin I (High Sensitivity): 5349 ng/L (ref <18)

## 2019-03-25 LAB — PROTIME-INR
INR: 1.1 (ref 0.8–1.2)
Prothrombin Time: 14.4 seconds (ref 11.4–15.2)

## 2019-03-25 LAB — APTT: aPTT: 28 seconds (ref 24–36)

## 2019-03-25 LAB — SARS CORONAVIRUS 2 BY RT PCR (HOSPITAL ORDER, PERFORMED IN ~~LOC~~ HOSPITAL LAB): SARS Coronavirus 2: NEGATIVE

## 2019-03-25 MED ORDER — SODIUM CHLORIDE 0.9 % WEIGHT BASED INFUSION
1.0000 mL/kg/h | INTRAVENOUS | Status: DC
  Administered 2019-03-26 (×2): 1 mL/kg/h via INTRAVENOUS

## 2019-03-25 MED ORDER — ASPIRIN EC 81 MG PO TBEC
81.0000 mg | DELAYED_RELEASE_TABLET | Freq: Every day | ORAL | Status: DC
  Administered 2019-03-26 – 2019-03-27 (×2): 81 mg via ORAL
  Filled 2019-03-25 (×2): qty 1

## 2019-03-25 MED ORDER — OXYCODONE-ACETAMINOPHEN 5-325 MG PO TABS
1.0000 | ORAL_TABLET | Freq: Three times a day (TID) | ORAL | Status: DC | PRN
  Administered 2019-03-25 – 2019-03-27 (×4): 1 via ORAL
  Filled 2019-03-25 (×4): qty 1

## 2019-03-25 MED ORDER — HEPARIN (PORCINE) 25000 UT/250ML-% IV SOLN
1250.0000 [IU]/h | INTRAVENOUS | Status: DC
  Administered 2019-03-25: 1100 [IU]/h via INTRAVENOUS
  Administered 2019-03-26: 1250 [IU]/h via INTRAVENOUS
  Filled 2019-03-25 (×2): qty 250

## 2019-03-25 MED ORDER — NITROGLYCERIN 0.4 MG SL SUBL: 0.4000 mg | SUBLINGUAL_TABLET | SUBLINGUAL | Status: DC | PRN

## 2019-03-25 MED ORDER — SODIUM CHLORIDE 0.9% FLUSH: 3.0000 mL | INTRAVENOUS | Status: DC | PRN

## 2019-03-25 MED ORDER — ASPIRIN 300 MG RE SUPP: 300.0000 mg | RECTAL | Status: AC

## 2019-03-25 MED ORDER — OXYCODONE-ACETAMINOPHEN 7.5-325 MG PO TABS: 1.0000 | ORAL_TABLET | Freq: Three times a day (TID) | ORAL | Status: DC | PRN

## 2019-03-25 MED ORDER — ASPIRIN 81 MG PO CHEW
81.0000 mg | CHEWABLE_TABLET | ORAL | Status: AC
  Administered 2019-03-26: 81 mg via ORAL
  Filled 2019-03-25: qty 1

## 2019-03-25 MED ORDER — CARVEDILOL 12.5 MG PO TABS
12.5000 mg | ORAL_TABLET | Freq: Two times a day (BID) | ORAL | Status: DC
  Administered 2019-03-25 – 2019-03-27 (×4): 12.5 mg via ORAL
  Filled 2019-03-25 (×4): qty 1

## 2019-03-25 MED ORDER — OXYCODONE-ACETAMINOPHEN 7.5-325 MG PO TABS: 1.0000 | ORAL_TABLET | Freq: Once | ORAL | Status: DC

## 2019-03-25 MED ORDER — SODIUM CHLORIDE 0.9% FLUSH: 3.0000 mL | Freq: Two times a day (BID) | INTRAVENOUS | Status: DC

## 2019-03-25 MED ORDER — PROCHLORPERAZINE MALEATE 10 MG PO TABS
10.0000 mg | ORAL_TABLET | Freq: Two times a day (BID) | ORAL | Status: DC | PRN
  Filled 2019-03-25: qty 1

## 2019-03-25 MED ORDER — SODIUM CHLORIDE 0.9 % IV SOLN: 250.0000 mL | INTRAVENOUS | Status: DC | PRN

## 2019-03-25 MED ORDER — SODIUM CHLORIDE 0.9% FLUSH
3.0000 mL | Freq: Two times a day (BID) | INTRAVENOUS | Status: DC
  Administered 2019-03-25 – 2019-03-27 (×3): 3 mL via INTRAVENOUS

## 2019-03-25 MED ORDER — OXYCODONE HCL 5 MG PO TABS
2.5000 mg | ORAL_TABLET | Freq: Once | ORAL | Status: AC
  Administered 2019-03-25: 2.5 mg via ORAL
  Filled 2019-03-25: qty 1

## 2019-03-25 MED ORDER — ROSUVASTATIN CALCIUM 20 MG PO TABS
20.0000 mg | ORAL_TABLET | Freq: Every day | ORAL | Status: DC
  Administered 2019-03-26: 20 mg via ORAL
  Filled 2019-03-25 (×2): qty 1

## 2019-03-25 MED ORDER — ALUM & MAG HYDROXIDE-SIMETH 200-200-20 MG/5ML PO SUSP
30.0000 mL | Freq: Once | ORAL | Status: AC
  Administered 2019-03-25: 30 mL via ORAL
  Filled 2019-03-25: qty 30

## 2019-03-25 MED ORDER — SODIUM CHLORIDE 0.9 % WEIGHT BASED INFUSION
3.0000 mL/kg/h | INTRAVENOUS | Status: DC
  Administered 2019-03-26: 3 mL/kg/h via INTRAVENOUS

## 2019-03-25 MED ORDER — HEPARIN SODIUM (PORCINE) 5000 UNIT/ML IJ SOLN
4000.0000 [IU] | Freq: Once | INTRAMUSCULAR | Status: AC
  Administered 2019-03-25: 4000 [IU] via INTRAVENOUS
  Filled 2019-03-25: qty 1

## 2019-03-25 MED ORDER — GABAPENTIN 300 MG PO CAPS
300.0000 mg | ORAL_CAPSULE | Freq: Three times a day (TID) | ORAL | Status: DC
  Administered 2019-03-25 – 2019-03-27 (×5): 300 mg via ORAL
  Filled 2019-03-25 (×5): qty 1

## 2019-03-25 MED ORDER — CLOPIDOGREL BISULFATE 75 MG PO TABS
75.0000 mg | ORAL_TABLET | Freq: Every day | ORAL | Status: DC
  Administered 2019-03-25 – 2019-03-27 (×3): 75 mg via ORAL
  Filled 2019-03-25 (×3): qty 1

## 2019-03-25 MED ORDER — ACETAMINOPHEN 325 MG PO TABS: 650.0000 mg | ORAL_TABLET | ORAL | Status: DC | PRN

## 2019-03-25 MED ORDER — PANTOPRAZOLE SODIUM 40 MG PO TBEC
40.0000 mg | DELAYED_RELEASE_TABLET | Freq: Every day | ORAL | Status: DC
  Administered 2019-03-26 – 2019-03-27 (×2): 40 mg via ORAL
  Filled 2019-03-25 (×2): qty 1

## 2019-03-25 MED ORDER — ASPIRIN 81 MG PO CHEW
324.0000 mg | CHEWABLE_TABLET | ORAL | Status: AC
  Administered 2019-03-25: 324 mg via ORAL
  Filled 2019-03-25: qty 4

## 2019-03-25 MED ORDER — TRAMADOL HCL 50 MG PO TABS
50.0000 mg | ORAL_TABLET | Freq: Four times a day (QID) | ORAL | Status: DC | PRN
  Administered 2019-03-27: 50 mg via ORAL
  Filled 2019-03-25: qty 1

## 2019-03-25 MED ORDER — ONDANSETRON HCL 4 MG/2ML IJ SOLN: 4.0000 mg | Freq: Four times a day (QID) | INTRAMUSCULAR | Status: DC | PRN

## 2019-03-25 MED ORDER — OXYCODONE-ACETAMINOPHEN 5-325 MG PO TABS
1.0000 | ORAL_TABLET | Freq: Once | ORAL | Status: AC
  Administered 2019-03-25: 1 via ORAL
  Filled 2019-03-25: qty 1

## 2019-03-25 MED ORDER — OXYCODONE HCL 5 MG PO TABS
2.5000 mg | ORAL_TABLET | Freq: Three times a day (TID) | ORAL | Status: DC | PRN
  Administered 2019-03-25 – 2019-03-27 (×4): 2.5 mg via ORAL
  Filled 2019-03-25 (×4): qty 1

## 2019-03-25 MED ORDER — ENALAPRIL MALEATE 20 MG PO TABS: 10.0000 mg | ORAL_TABLET | Freq: Every day | ORAL | Status: DC

## 2019-03-25 NOTE — ED Notes (Signed)
Pt given ice pack for shoulder.

## 2019-03-25 NOTE — ED Provider Notes (Addendum)
[facility] EMERGENCY DEPARTMENT Provider Note   CSN: [id number] Arrival date & time: 03/25/19  0800     History   Chief Complaint Chief Complaint  Patient presents with   Chest Pain    HPI Jeremy [name] is a 73 y.o. male.     Chief complaint anterior chest pain with radiation to the neck earlier today.  Patient took 3 nitroglycerin which helped.  Known CAD with previous MI and stenting.  No dyspnea, diaphoresis, nausea.  Past medical history includes CAD, hypertension, CVA, hypercholesterolemia.  Severity is moderate.  Nothing makes symptoms better or worse.     Past Medical History:  Diagnosis Date   Arthritis    Carotid stenosis, asymptomatic, right    Carpal tunnel syndrome    bilateral   Coronary artery disease    GERD (gastroesophageal reflux disease)    History of kidney stones    Hx of colonic polyp    Hypercholesterolemia    Hypertension    Myocardial infarction ([place])    hx of   Obesity    Stroke [facility])     Patient Active Problem List   Diagnosis Date Noted   ACS (acute coronary syndrome) ([place]) 03/25/2019   Unstable angina ([place]) 03/25/2019   NSTEMI (non-ST elevated myocardial infarction) ([place]) 03/25/2019   Cerebral thrombosis with cerebral infarction 06/28/2018   Hypernatremia 06/27/2018   CKD (chronic kidney disease), stage III 06/27/2018   Acute respiratory failure with hypoxia ([place])    Seizure ([place]) 06/20/2018   Acute encephalopathy 06/20/2018   Asymptomatic carotid artery stenosis without infarction, right 12/25/2017   Pre-operative cardiovascular examination 11/03/2017   Carotid artery disease ([place]) 11/03/2017   Shoulder joint pain 09/17/2013   Numbness around mouth 12/01/2011   Erectile dysfunction 09/05/2010   TRIGGER FINGER, LEFT MIDDLE 04/20/2010   ANAL FISSURE 04/03/2009   Mixed dyslipidemia 04/01/2009   RECTAL BLEEDING 04/01/2009   ABDOMINAL PAIN-LLQ 04/01/2009   ABDOMINAL PAIN, GENERALIZED  03/30/2009   OTITIS EXTERNA, LEFT 05/07/2008   BRONCHITIS, ACUTE 04/21/2008   CARPAL TUNNEL SYNDROME, BILATERAL 12/25/2007   COLD SORE 03/12/2007   CAD (coronary artery disease) DES pLAD 03/25/19 01/01/2007   OBESITY NOS 06/19/2006   Essential hypertension 06/19/2006   MYOCARDIAL INFARCTION, HX OF 06/19/2006   COLONIC POLYPS, HX OF 06/19/2006    Past Surgical History:  Procedure Laterality Date   CARDIAC CATHETERIZATION     CORONARY BALLOON ANGIOPLASTY N/A 03/26/2019   Procedure: CORONARY BALLOON ANGIOPLASTY;  Surgeon: Burnell Blanks, MD;  Location: [place] CV LAB;  Service: Cardiovascular;  Laterality: N/A;   CORONARY STENT INTERVENTION N/A 03/26/2019   Procedure: CORONARY STENT INTERVENTION;  Surgeon: Burnell Blanks, MD;  Location: [place] [facility];  Service: Cardiovascular;  Laterality: N/A;   CORONARY STENT PLACEMENT  2000   x3    CYST EXCISION     x 2   ENDARTERECTOMY Right 12/25/2017   Procedure: ENDARTERECTOMY CAROTID RIGHT;  Surgeon: Rosetta Posner, MD;  Location: [place];  Service: Vascular;  Laterality: Right;   LEFT HEART CATH AND CORONARY ANGIOGRAPHY N/A 03/26/2019   Procedure: LEFT HEART CATH AND CORONARY ANGIOGRAPHY;  Surgeon: Burnell Blanks, MD;  Location: [place] [facility];  Service: Cardiovascular;  Laterality: N/A;   PATCH ANGIOPLASTY Right 12/25/2017   Procedure: PATCH ANGIOPLASTY RIGHT CAROTID ARTERY;  Surgeon: Rosetta Posner, MD;  Location: [place];  Service: Vascular;  Laterality: Right;   TEE WITHOUT CARDIOVERSION N/A 06/29/2018   Procedure: TRANSESOPHAGEAL ECHOCARDIOGRAM (TEE);  Surgeon: Fay Records, MD;  Location: [facility] ENDOSCOPY;  Service: Cardiovascular;  Laterality: N/A;        Home Medications    Prior to Admission medications   Medication Sig Start Date End Date Taking? Authorizing Provider  carvedilol (COREG) 12.5 MG tablet Take 1 tablet (12.5 mg total) by mouth 2 (two) times daily with a meal. 06/30/18  Yes  Mariel Aloe, MD  clopidogrel (PLAVIX) 75 MG tablet Take 1 tablet by mouth daily. 01/14/19  Yes [provider]  enalapril (VASOTEC) 10 MG tablet Take 10 mg by mouth daily. 12/11/18  Yes [provider]  gabapentin (NEURONTIN) 300 MG capsule Take 1 capsule (300 mg total) by mouth 3 (three) times daily. 08/21/18  Yes Garvin Fila, MD  omeprazole (PRILOSEC) 40 MG capsule TAKE 1 CAPSULE BY MOUTH ONCE DAILY FOR REFLUX 02/01/19  Yes [provider]  oxyCODONE-acetaminophen (PERCOCET) 7.5-325 MG tablet Take 1 tablet by mouth 3 (three) times daily as needed. for pain 02/01/19  Yes [provider]  rosuvastatin (CRESTOR) 20 MG tablet Take 1 tablet (20 mg total) by mouth daily at 6 PM. 06/30/18  Yes Mariel Aloe, MD  traMADol (ULTRAM) 50 MG tablet Take 1 tablet by mouth every 6 (six) hours as needed. 01/15/19  Yes [provider]  aspirin EC 81 MG EC tablet Take 1 tablet (81 mg total) by mouth daily. 03/28/19   Furth, Cadence H, PA-C  hydrocortisone (ANUSOL-HC) 25 MG suppository Place 1 suppository (25 mg total) rectally at bedtime. 04/02/19   Irene Shipper, MD  nitroGLYCERIN (NITROSTAT) 0.4 MG SL tablet Place 1 tablet (0.4 mg total) under the tongue every 5 (five) minutes as needed for chest pain. 03/27/19   Furth, Cadence H, PA-C  oxyCODONE-acetaminophen (PERCOCET) 7.5-325 MG tablet Take 1 tablet by mouth every 4 (four) hours as needed for severe pain. 03/27/19 03/26/20  Croitoru, Mihai, MD  prochlorperazine (COMPAZINE) 10 MG tablet Take 1 tablet (10 mg total) by mouth 2 (two) times daily as needed (Headache). Patient not taking: Reported on 03/25/2019 10/27/17   Charlann Lange, PA-C    Family History Family History  Problem Relation Age of Onset   Other Father 25       deceased old age   Coronary artery disease Mother 58       deceased   Hypertension Mother    Other Sister 51       living and healthy   Coronary artery disease Brother    Skin cancer  Brother    Colon cancer Neg Hx    Pancreatic cancer Neg Hx    Stomach cancer Neg Hx    Esophageal cancer Neg Hx    Liver disease Neg Hx     Social History Social History   Tobacco Use   Smoking status: Former Smoker    Quit date: 05/23/1988    Years since quitting: 30.9   Smokeless tobacco: Never Used   Tobacco comment: smoked 2 packs per day for 20 years  Substance Use Topics   Alcohol use: No   Drug use: No     Allergies   Codeine, Oxycontin [oxycodone hcl], Contrast media [iodinated diagnostic agents], and Morphine and related   Review of Systems Review of Systems  All other systems reviewed and are negative.    Physical Exam Updated Vital Signs BP (!) 159/63    Pulse 73    Temp 97.6 F (36.4 C) (Oral)    Resp 15  Ht 5\' 8"  (1.727 m)    Wt 93.5 kg    SpO2 96%    BMI 31.34 kg/m   Physical Exam Vitals signs and nursing note reviewed.  Constitutional:      Appearance: He is well-developed.  HENT:     Head: Normocephalic and atraumatic.  Eyes:     Conjunctiva/sclera: Conjunctivae normal.  Neck:     Musculoskeletal: Neck supple.  Cardiovascular:     Rate and Rhythm: Normal rate and regular rhythm.  Pulmonary:     Effort: Pulmonary effort is normal.     Breath sounds: Normal breath sounds.  Abdominal:     General: Bowel sounds are normal.     Palpations: Abdomen is soft.  Musculoskeletal: Normal range of motion.  Skin:    General: Skin is warm and dry.  Neurological:     General: No focal deficit present.     Mental Status: He is alert and oriented to person, place, and time.  Psychiatric:        Behavior: Behavior normal.      ED Treatments / Results  Labs (all labs ordered are listed, but only abnormal results are displayed) Labs Reviewed  BASIC METABOLIC PANEL - Abnormal; Notable for the following components:      Result Value   Glucose, Bld 131 (*)    BUN 24 (*)    Creatinine, Ser 1.44 (*)    GFR calc non Af Amer 48 (*)    GFR  calc Af Amer 55 (*)    All other components within normal limits  HEPARIN LEVEL (UNFRACTIONATED) - Abnormal; Notable for the following components:   Heparin Unfractionated 0.27 (*)    All other components within normal limits  CBC - Abnormal; Notable for the following components:   RBC 3.96 (*)    Hemoglobin 11.9 (*)    HCT 35.9 (*)    All other components within normal limits  BASIC METABOLIC PANEL - Abnormal; Notable for the following components:   Glucose, Bld 108 (*)    Creatinine, Ser 1.40 (*)    GFR calc non Af Amer 49 (*)    GFR calc Af Amer 57 (*)    All other components within normal limits  LIPID PANEL - Abnormal; Notable for the following components:   HDL 32 (*)    All other components within normal limits  BASIC METABOLIC PANEL - Abnormal; Notable for the following components:   Glucose, Bld 160 (*)    Creatinine, Ser 1.30 (*)    GFR calc non Af Amer 54 (*)    All other components within normal limits  CBC - Abnormal; Notable for the following components:   RBC 4.10 (*)    Hemoglobin 12.3 (*)    HCT 36.8 (*)    All other components within normal limits  TROPONIN I (HIGH SENSITIVITY) - Abnormal; Notable for the following components:   Troponin I (High Sensitivity) 44 (*)    All other components within normal limits  TROPONIN I (HIGH SENSITIVITY) - Abnormal; Notable for the following components:   Troponin I (High Sensitivity) 412 (*)    All other components within normal limits  TROPONIN I (HIGH SENSITIVITY) - Abnormal; Notable for the following components:   Troponin I (High Sensitivity) 3,138 (*)    All other components within normal limits  TROPONIN I (HIGH SENSITIVITY) - Abnormal; Notable for the following components:   Troponin I (High Sensitivity) 5,349 (*)    All other components within normal  limits  SARS CORONAVIRUS 2 BY RT PCR (HOSPITAL ORDER, [place] [facility])  CBC WITH DIFFERENTIAL/PLATELET  PROTIME-INR  APTT  HEPARIN LEVEL  (UNFRACTIONATED)  POCT ACTIVATED CLOTTING TIME  POCT ACTIVATED CLOTTING TIME    EKG EKG Interpretation  Date/Time:  Monday March 25 2019 11:03:34 EST Ventricular Rate:  77 PR Interval:    QRS Duration: 129 QT Interval:  364 QTC Calculation: 412 R Axis:   5 Text Interpretation: Sinus rhythm Left bundle branch block prolonged PR interval RESOLVED SINCE PREVIOUS Confirmed by Blanchie Dessert [phone]) on 03/26/2019 7:45:38 PM   Radiology No results found.  Procedures Procedures (including critical care time)  Medications Ordered in ED Medications  labetalol (NORMODYNE) injection 10 mg (has no administration in time range)  hydrALAZINE (APRESOLINE) injection 10 mg (has no administration in time range)  0.9 %  sodium chloride infusion ( Intravenous Stopped 03/27/19 0535)  alum & mag hydroxide-simeth (MAALOX/MYLANTA) 200-200-20 MG/5ML suspension 30 mL (30 mLs Oral Given 03/25/19 1042)  oxyCODONE-acetaminophen (PERCOCET/ROXICET) 5-325 MG per tablet 1 tablet (1 tablet Oral Given 03/25/19 1233)    And  oxyCODONE (Oxy IR/ROXICODONE) immediate release tablet 2.5 mg (2.5 mg Oral Given 03/25/19 1233)  heparin injection 4,000 Units (4,000 Units Intravenous Given 03/25/19 1333)  aspirin chewable tablet 81 mg (81 mg Oral Given 03/26/19 0543)  aspirin chewable tablet 324 mg (324 mg Oral Given 03/25/19 1334)    Or  aspirin suppository 300 mg ( Rectal See Alternative 03/25/19 1334)  lidocaine (PF) (XYLOCAINE) 1 % injection (has no administration in time range)  Heparin (Porcine) in NaCl 1000-0.9 UT/500ML-% SOLN (has no administration in time range)  midazolam (VERSED) 2 MG/2ML injection (has no administration in time range)  fentaNYL (SUBLIMAZE) 100 MCG/2ML injection (has no administration in time range)  verapamil (ISOPTIN) 2.5 MG/ML injection (has no administration in time range)  methylPREDNISolone sodium succinate (SOLU-MEDROL) 125 mg/2 mL injection (has no administration in time range)    diphenhydrAMINE (BENADRYL) 50 MG/ML injection (has no administration in time range)  heparin 1000 UNIT/ML injection (has no administration in time range)  nitroGLYCERIN 100 mcg/mL intra-arterial injection (has no administration in time range)  midazolam (VERSED) 2 MG/2ML injection (has no administration in time range)  heparin 1000 UNIT/ML injection (has no administration in time range)     Initial Impression / Assessment and Plan / ED Course  I have reviewed the triage vital signs and the nursing notes.  Pertinent labs & imaging results that were available during my care of the patient were reviewed by me and considered in my medical decision making (see chart for details).        Patient with known CAD presents with chest pain which responded to nitroglycerin.  He is hemodynamically stable at this time.  Will do typical cardiac work-up.  1115: Recheck.  Complains of slight burning which patient relates to reflux.  Troponin has bumped from 44-412.  EKG unchanged.  Will discuss with cardiology.  Transfer to [facility].   CRITICAL CARE Performed by: Nat Christen Total critical care time: 40 minutes Critical care time was exclusive of separately billable procedures and treating other patients. Critical care was necessary to treat or prevent imminent or life-threatening deterioration. Critical care was time spent personally by me on the following activities: development of treatment plan with patient and/or surrogate as well as nursing, discussions with consultants, evaluation of patient's response to treatment, examination of patient, obtaining history from patient or surrogate, ordering and performing treatments and  interventions, ordering and review of laboratory studies, ordering and review of radiographic studies, pulse oximetry and re-evaluation of patient's condition.     Final Clinical Impressions(s) / ED Diagnoses   Final diagnoses:  Chest pain, unspecified type  Elevated troponin     ED Discharge Orders         Ordered    aspirin EC 81 MG EC tablet  Daily     03/27/19 1222    Amb Referral to Cardiac Rehabilitation     03/27/19 0907    nitroGLYCERIN (NITROSTAT) 0.4 MG SL tablet  Every 5 min PRN     03/27/19 1222    oxyCODONE-acetaminophen (PERCOCET) 7.5-325 MG tablet  Every 4 hours PRN     03/27/19 1333    Increase activity slowly     03/27/19 1335    Diet - low sodium heart healthy     03/27/19 1335    Discharge instructions    Comments: No driving for 1 week. No lifting over 10 lbs for 2 weeks. No sexual activity for 2 weeks. Keep procedure site clean & dry. If you notice increased pain, swelling, bleeding or pus, call/return!  You may shower, but no soaking baths/hot tubs/pools for 1 week.   03/27/19 1335    Call MD for:  extreme fatigue     03/27/19 1335    Call MD for:  hives     03/27/19 1335    Call MD for:  difficulty breathing, headache or visual disturbances     03/27/19 1335    Call MD for:  redness, tenderness, or signs of infection (pain, swelling, redness, odor or green/yellow discharge around incision site)     03/27/19 1335    Call MD for:  persistant nausea and vomiting     03/27/19 1335           Nat Christen, MD 03/25/19 [id number]    Nat Christen, MD 03/25/19 1148    Nat Christen, MD 04/10/19 [id number]    Nat Christen, MD 04/10/19 1002

## 2019-03-25 NOTE — ED Notes (Addendum)
[phone] Number provided by Sallyanne Kuster, MD for Johnsie Cancel, MD. Paged Johnsie Cancel, MD, no response.

## 2019-03-25 NOTE — ED Notes (Signed)
Date and time results received: 03/25/19 4:33 PM  (use smartphrase ".now" to insert current time)  Test: troponin Critical Value: 5349  Name of Provider Notified: Croitoru, MD  Orders Received? Or Actions Taken?: n/a

## 2019-03-25 NOTE — H&P (Signed)
Physician History and Physical     Patient ID: Jeremy [name] MRN: [id number] DOB/AGE: 1946/04/05 73 y.o. Admit date: 03/25/2019  Primary Care Physician: Celene Squibb, MD Primary Cardiologist: [place]  Active Problems:   ACS (acute coronary syndrome) [facility])   Unstable angina [facility])   HPI:  73 y.o. distant history of coronary stents by Dr Lia Foyer Patient indicates more than 10 years ago. No cath note in Epic and Dr Maren Beach notes from 2012 do not indicate what artery was stented. He was seen by Dr Geraldo Pitter 02/11/19 for preoperative clearance and had a low risk myovue 03/20/19 Seen in ER today with significant throat and chest pain. Took 3 nitro at home with partial relief. Has significant reflux and GI cocktail in ER helped Had EGD by Dr Henrene Pastor 02/22/19 that was normal. Stroke in February with TEE showing EF 45-50% no SOE. No afib. Rx with plavix PVD with right CEA done by Dr Donnetta Hutching 12/25/17.  He is active doing welding Just got remarried 2 months ago. 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 At [facility]. In ER ECG with ICLBBB and troponin elevated at 412 with significant delta from 44 Pain free now   Review of systems complete and found to be negative unless listed above   Past Medical History:  Diagnosis Date   Arthritis    Carotid stenosis, asymptomatic, right    Carpal tunnel syndrome    bilateral   Coronary artery disease    GERD (gastroesophageal reflux disease)    History of kidney stones    Hx of colonic polyp    Hypercholesterolemia    Hypertension    Myocardial infarction (HCC)    hx of   Obesity    Stroke ([place])     Family History  Problem Relation Age of Onset   Other Father 89       deceased old age   Coronary artery disease Mother 56       deceased   Hypertension Mother    Other Sister 66       living and healthy   Coronary artery disease Brother    Skin cancer Brother    Colon cancer Neg Hx    Pancreatic cancer Neg Hx     Stomach cancer Neg Hx    Esophageal cancer Neg Hx    Liver disease Neg Hx     Social History   Socioeconomic History   Marital status: Divorced    Spouse name: Not on file   Number of children: Not on file   Years of education: Not on file   Highest education level: Not on file  Occupational History   Occupation: retired    Comment: welder  Scientist, product/process development strain: Not on file   Food insecurity    Worry: Not on file    Inability: Not on file   Transportation needs    Medical: Not on file    Non-medical: Not on file  Tobacco Use   Smoking status: Former Smoker    Quit date: 05/23/1988    Years since quitting: 30.8   Smokeless tobacco: Never Used   Tobacco comment: smoked 2 packs per day for 20 years  Substance and Sexual Activity   Alcohol use: No   Drug use: No   Sexual activity: Not on file  Lifestyle   Physical activity    Days per week: Not on file    Minutes per session: Not on file  Stress: Not on file  Relationships   Social connections    Talks on phone: Not on file    Gets together: Not on file    Attends religious service: Not on file    Active member of club or organization: Not on file    Attends meetings of clubs or organizations: Not on file    Relationship status: Not on file   Intimate partner violence    Fear of current or ex partner: Not on file    Emotionally abused: Not on file    Physically abused: Not on file    Forced sexual activity: Not on file  Other Topics Concern   Not on file  Social History Narrative   Not on file    Past Surgical History:  Procedure Laterality Date   CARDIAC CATHETERIZATION     CORONARY STENT PLACEMENT  2000   x3    CYST EXCISION     x 2   ENDARTERECTOMY Right 12/25/2017   Procedure: ENDARTERECTOMY CAROTID RIGHT;  Surgeon: Rosetta Posner, MD;  Location: [facility];  Service: Vascular;  Laterality: Right;   PATCH ANGIOPLASTY Right 12/25/2017   Procedure: PATCH ANGIOPLASTY  RIGHT CAROTID ARTERY;  Surgeon: Rosetta Posner, MD;  Location: [facility];  Service: Vascular;  Laterality: Right;   TEE WITHOUT CARDIOVERSION N/A 06/29/2018   Procedure: TRANSESOPHAGEAL ECHOCARDIOGRAM (TEE);  Surgeon: Fay Records, MD;  Location: [facility] ENDOSCOPY;  Service: Cardiovascular;  Laterality: N/A;     (Not in a hospital admission)   Physical Exam: Blood pressure 122/60, pulse 69, temperature 98.7 F (37.1 C), temperature source Oral, resp. rate 14, height 5\' 7"  (1.702 m), weight 95.3 kg, SpO2 92 %.    Affect appropriate Healthy:  appears stated age 73: normal Neck supple with no adenopathy JVP normal right CEA  no thyromegaly Lungs clear with no wheezing and good diaphragmatic motion Heart:  S1/S2 no murmur, no rub, gallop or click PMI normal Abdomen: benighn, BS positve, no tenderness, no AAA no bruit.  No HSM or HJR Distal pulses intact with no bruits No edema Neuro non-focal Skin warm and dry No muscular weakness  No current facility-administered medications on file prior to encounter.    Current Outpatient Medications on File Prior to Encounter  Medication Sig Dispense Refill   carvedilol (COREG) 12.5 MG tablet Take 1 tablet (12.5 mg total) by mouth 2 (two) times daily with a meal. 60 tablet 0   clopidogrel (PLAVIX) 75 MG tablet Take 1 tablet by mouth daily.     enalapril (VASOTEC) 10 MG tablet Take 10 mg by mouth daily.     gabapentin (NEURONTIN) 300 MG capsule Take 1 capsule (300 mg total) by mouth 3 (three) times daily. 90 capsule 6   nitroGLYCERIN (NITROSTAT) 0.4 MG SL tablet Place 0.4 mg under the tongue every 5 (five) minutes as needed for chest pain.      omeprazole (PRILOSEC) 40 MG capsule TAKE 1 CAPSULE BY MOUTH ONCE DAILY FOR REFLUX     oxyCODONE-acetaminophen (PERCOCET) 7.5-325 MG tablet Take 1 tablet by mouth 3 (three) times daily as needed. for pain     rosuvastatin (CRESTOR) 20 MG tablet Take 1 tablet (20 mg total) by mouth daily at 6 PM. 30 tablet  0   traMADol (ULTRAM) 50 MG tablet Take 1 tablet by mouth every 6 (six) hours as needed.     prochlorperazine (COMPAZINE) 10 MG tablet Take 1 tablet (10 mg total) by mouth 2 (two) times daily  as needed (Headache). (Patient not taking: Reported on 03/25/2019) 10 tablet 0    Labs:   Lab Results  Component Value Date   WBC 8.1 03/25/2019   HGB 13.0 03/25/2019   HCT 39.7 03/25/2019   MCV 92.3 03/25/2019   PLT 175 03/25/2019    Recent Labs  Lab 03/25/19 0826  NA 137  K 4.0  CL 102  CO2 24  BUN 24*  CREATININE 1.44*  CALCIUM 9.4  GLUCOSE 131*   Lab Results  Component Value Date   TROPONINI 1.01 (HH) 06/23/2018   TROPONINI 1.37 (HH) 06/21/2018   TROPONINI 1.82 (HH) 06/21/2018     Lab Results  Component Value Date   CHOL 125 02/11/2019   CHOL 195 06/23/2018   CHOL 127 12/26/2007   Lab Results  Component Value Date   HDL 45 02/11/2019   HDL 40 (L) 06/23/2018   HDL 36 (L) 12/26/2007   Lab Results  Component Value Date   LDLCALC 64 02/11/2019   LDLCALC 113 (H) 06/23/2018   LDLCALC 55 12/26/2007   Lab Results  Component Value Date   TRIG 83 02/11/2019   TRIG 295 (H) 06/23/2018   TRIG 212 (H) 06/23/2018   Lab Results  Component Value Date   CHOLHDL 2.8 02/11/2019   CHOLHDL 4.9 06/23/2018   CHOLHDL 3.5 Ratio 12/26/2007   No results found for: LDLDIRECT     Radiology: Ct Shoulder Right Wo Contrast  Result Date: 03/13/2019 CLINICAL DATA:  Preop for total shoulder arthroplasty. EXAM: CT OF THE UPPER RIGHT EXTREMITY WITHOUT CONTRAST TECHNIQUE: Multidetector CT imaging of the upper right extremity was performed according to the standard protocol. COMPARISON:  None. FINDINGS: Severe glenohumeral joint degenerative changes with full-thickness cartilage loss, marked joint space narrowing, osteophytic spurring, bony eburnation and subchondral cystic change. There is mild widening and thinning of the glenoid. No acute bony findings or AVN. Advanced AC joint  degenerative changes are also noted with inferior spurring from the distal clavicle. Grossly the rotator cuff tendons are intact. No obvious large full-thickness retracted tear. Marked spurring changes around the bicipital groove for noted. The visualized right ribs are intact and the visualized right lung is grossly clear. IMPRESSION: 1. Severe glenohumeral joint degenerative changes as detailed above. 2. Advanced AC joint degenerative changes. 3. Grossly intact rotator cuff tendons. Electronically Signed   By: Marijo Sanes M.D.   On: 03/13/2019 10:22   Dg Chest Portable 1 View  Result Date: 03/25/2019 CLINICAL DATA:  Chest pain and pain in throat EXAM: PORTABLE CHEST 1 VIEW COMPARISON:  January 30, 2019 FINDINGS: No new consolidation or edema. No pleural effusion or pneumothorax. Stable cardiomediastinal contours with normal heart size. Atherosclerotic calcification the aortic arch. Saber trachea is again noted. Significant degenerative changes at the glenohumeral joints. IMPRESSION: No new findings. Electronically Signed   By: Macy Mis M.D.   On: 03/25/2019 08:43    EKG SR ICLBBB  ASSESSMENT AND PLAN:  1. SEMI:  Despite low risk myovue 10/28 concern for SSCP partially relieved with nitro and no esophageal disease on recent EGD. He has distant stents Need to get old cath report. Needs two shoulder surgeries under general anesthesia. Favor transfer to [facility] for diagnostic cath in am with Dr Tamala Julian. Start heparin  Signed: 2. PVD:  Post right CEA f/u Dr Donnetta Hutching ASA/Plavix 3. HTN:  Well controlled.  Continue current medications and low sodium Dash type diet.   4. HLD:  Continue crestor Collier Salina Nishan11/06/2018, 1:14 PM

## 2019-03-25 NOTE — ED Notes (Signed)
[facility] cardiology MD on call paged at this time to correct admission orders & have pt placed in a cardiac tele bed at [facility].

## 2019-03-25 NOTE — ED Triage Notes (Signed)
Pt reports woke up with chest pain and pain in throat.  Reports went away after 3 nitro.

## 2019-03-25 NOTE — ED Notes (Signed)
[facility] called at this time for transport  ?

## 2019-03-25 NOTE — ED Notes (Signed)
CRITICAL VALUE ALERT  Critical Value:  Troponin 3138 Date & Time Notied:  03/25/2019, 1424  Provider Notified: Dr. Lacinda Axon   Orders Received/Actions taken: instructed to notify Dr. Johnsie Cancel,  Unit secretary paging cardiologist.

## 2019-03-25 NOTE — Progress Notes (Signed)
ANTICOAGULATION CONSULT NOTE - Follow Up Consult  Pharmacy Consult for heparin Indication: chest pain/ACS  Labs: Recent Labs    03/25/19 0826 03/25/19 1008 03/25/19 1325 03/25/19 1530 03/25/19 2206  HGB 13.0  --   --   --   --   HCT 39.7  --   --   --   --   PLT 175  --   --   --   --   APTT  --   --  28  --   --   LABPROT  --   --  14.4  --   --   INR  --   --  1.1  --   --   HEPARINUNFRC  --   --   --   --  0.27*  CREATININE 1.44*  --   --   --   --   TROPONINIHS 44* 412* 3,138* 5,349*  --     Assessment: 73yo male subtherapeutic on heparin with initial; dosing for CP; no gtt issues or signs of bleeding per RN.  Goal of Therapy:  Heparin level 0.3-0.7 units/ml   Plan:  Will increase heparin gtt by 1-2 units/kg/hr to 1250 units/hr and check level in 8 hours.    Wynona Neat, PharmD, BCPS  03/25/2019,11:41 PM

## 2019-03-25 NOTE — ED Notes (Signed)
CRITICAL VALUE ALERT  Critical Value:  Troponin 412  Date & Time Notied:  03/25/2019, 1059  Provider Notified: Dr. Lacinda Axon   Orders Received/Actions taken: consult to cardiology

## 2019-03-25 NOTE — Progress Notes (Signed)
ANTICOAGULATION CONSULT NOTE - Initial Consult  Pharmacy Consult for heparin Indication: chest pain/ACS/STEMI  Allergies  Allergen Reactions  . Codeine Anaphylaxis  . Oxycontin [Oxycodone Hcl] Shortness Of Breath and Itching    Through IV- has since had oral oxycodone without issue  . Contrast Media [Iodinated Diagnostic Agents]     Patient got very nauseated. Gave 4mg  Zofran. Possibly pre medicate with Zofran   . Morphine And Related Itching    Patient Measurements: Height: 5\' 7"  (170.2 cm) Weight: 210 lb (95.3 kg) IBW/kg (Calculated) : 66.1 Heparin Dosing Weight: 86 kg  Vital Signs: Temp: 98.7 F (37.1 C) (11/02 0808) Temp Source: Oral (11/02 0808) BP: 122/60 (11/02 1230) Pulse Rate: 69 (11/02 1230)  Labs: Recent Labs    03/25/19 0826 03/25/19 1008  HGB 13.0  --   HCT 39.7  --   PLT 175  --   CREATININE 1.44*  --   TROPONINIHS 44* 412*    Estimated Creatinine Clearance: 50.3 mL/min (A) (by C-G formula based on SCr of 1.44 mg/dL (H)).   Medical History: Past Medical History:  Diagnosis Date  . Arthritis   . Carotid stenosis, asymptomatic, right   . Carpal tunnel syndrome    bilateral  . Coronary artery disease   . GERD (gastroesophageal reflux disease)   . History of kidney stones   . Hx of colonic polyp   . Hypercholesterolemia   . Hypertension   . Myocardial infarction ([place])    hx of  . Obesity   . Stroke [facility])     Medications:  (Not in a hospital admission)   Assessment: Pharmacy consulted to dose heparin in patient for ACS/STEMI.  Patient is not on anticoagulation prior to admission.  Troponin elevated at 412.  Goal of Therapy:  Heparin level 0.3-0.7 units/ml Monitor platelets by anticoagulation protocol: Yes   Plan:  Give 4000 units bolus x 1 Start heparin infusion at 1100 units/hr Check anti-Xa level in 6-8 hours and daily while on heparin Continue to monitor H&H and platelets  Jeremy [name] 03/25/2019,1:06 PM

## 2019-03-26 ENCOUNTER — Inpatient Hospital Stay (HOSPITAL_COMMUNITY)
Admission: EM | Disposition: A | Discharge status: Home / Self Care | Payer: Self-pay | Source: Home / Self Care | Attending: Cardiology

## 2019-03-26 ENCOUNTER — Encounter (HOSPITAL_COMMUNITY): Payer: Self-pay | Admitting: General Practice

## 2019-03-26 ENCOUNTER — Other Ambulatory Visit: Payer: Self-pay

## 2019-03-26 ENCOUNTER — Ambulatory Visit (HOSPITAL_COMMUNITY): Admission: RE | Admit: 2019-03-26 | Payer: PPO | Source: Home / Self Care | Admitting: Interventional Cardiology

## 2019-03-26 DIAGNOSIS — I10 Essential (primary) hypertension: Secondary | ICD-10-CM

## 2019-03-26 DIAGNOSIS — I2510 Atherosclerotic heart disease of native coronary artery without angina pectoris: Secondary | ICD-10-CM

## 2019-03-26 DIAGNOSIS — E785 Hyperlipidemia, unspecified: Secondary | ICD-10-CM

## 2019-03-26 DIAGNOSIS — I25110 Atherosclerotic heart disease of native coronary artery with unstable angina pectoris: Secondary | ICD-10-CM

## 2019-03-26 DIAGNOSIS — I214 Non-ST elevation (NSTEMI) myocardial infarction: Principal | ICD-10-CM

## 2019-03-26 HISTORY — PX: CORONARY BALLOON ANGIOPLASTY: CATH118233

## 2019-03-26 HISTORY — PX: CORONARY STENT INTERVENTION: CATH118234

## 2019-03-26 HISTORY — PX: LEFT HEART CATH AND CORONARY ANGIOGRAPHY: CATH118249

## 2019-03-26 LAB — POCT ACTIVATED CLOTTING TIME
Activated Clotting Time: 312 seconds
Activated Clotting Time: 362 seconds

## 2019-03-26 LAB — LIPID PANEL
Cholesterol: 87 mg/dL (ref 0–200)
HDL: 32 mg/dL — ABNORMAL LOW (ref >40)
LDL Cholesterol: 36 mg/dL (ref 0–99)
Total CHOL/HDL Ratio: 2.7 RATIO
Triglycerides: 95 mg/dL (ref <150)
VLDL: 19 mg/dL (ref 0–40)

## 2019-03-26 LAB — BASIC METABOLIC PANEL
Anion gap: 8 (ref 5–15)
BUN: 21 mg/dL (ref 8–23)
CO2: 27 mmol/L (ref 22–32)
Calcium: 8.9 mg/dL (ref 8.9–10.3)
Chloride: 104 mmol/L (ref 98–111)
Creatinine, Ser: 1.4 mg/dL — ABNORMAL HIGH (ref 0.61–1.24)
GFR calc Af Amer: 57 mL/min — ABNORMAL LOW (ref >60)
GFR calc non Af Amer: 49 mL/min — ABNORMAL LOW (ref >60)
Glucose, Bld: 108 mg/dL — ABNORMAL HIGH (ref 70–99)
Potassium: 3.6 mmol/L (ref 3.5–5.1)
Sodium: 139 mmol/L (ref 135–145)

## 2019-03-26 LAB — CBC
HCT: 35.9 % — ABNORMAL LOW (ref 39.0–52.0)
Hemoglobin: 11.9 g/dL — ABNORMAL LOW (ref 13.0–17.0)
MCH: 30.1 pg (ref 26.0–34.0)
MCHC: 33.1 g/dL (ref 30.0–36.0)
MCV: 90.7 fL (ref 80.0–100.0)
Platelets: 170 10*3/uL (ref 150–400)
RBC: 3.96 MIL/uL — ABNORMAL LOW (ref 4.22–5.81)
RDW: 12.6 % (ref 11.5–15.5)
WBC: 4.9 10*3/uL (ref 4.0–10.5)
nRBC: 0 % (ref 0.0–0.2)

## 2019-03-26 LAB — HEPARIN LEVEL (UNFRACTIONATED): Heparin Unfractionated: 0.49 IU/mL (ref 0.30–0.70)

## 2019-03-26 SURGERY — LEFT HEART CATH AND CORONARY ANGIOGRAPHY
Anesthesia: LOCAL

## 2019-03-26 MED ORDER — DIPHENHYDRAMINE HCL 50 MG/ML IJ SOLN
INTRAMUSCULAR | Status: DC | PRN
  Administered 2019-03-26: 25 mg via INTRAVENOUS

## 2019-03-26 MED ORDER — VERAPAMIL HCL 2.5 MG/ML IV SOLN
INTRAVENOUS | Status: DC | PRN
  Administered 2019-03-26: 10 mL via INTRA_ARTERIAL

## 2019-03-26 MED ORDER — HEPARIN SODIUM (PORCINE) 1000 UNIT/ML IJ SOLN
INTRAMUSCULAR | Status: AC
  Filled 2019-03-26: qty 1

## 2019-03-26 MED ORDER — LABETALOL HCL 5 MG/ML IV SOLN: 10.0000 mg | INTRAVENOUS | Status: AC | PRN

## 2019-03-26 MED ORDER — FENTANYL CITRATE (PF) 100 MCG/2ML IJ SOLN
INTRAMUSCULAR | Status: AC
  Filled 2019-03-26: qty 2

## 2019-03-26 MED ORDER — LIDOCAINE HCL (PF) 1 % IJ SOLN
INTRAMUSCULAR | Status: DC | PRN
  Administered 2019-03-26: 5 mL via INTRADERMAL

## 2019-03-26 MED ORDER — LIDOCAINE HCL (PF) 1 % IJ SOLN
INTRAMUSCULAR | Status: AC
  Filled 2019-03-26: qty 30

## 2019-03-26 MED ORDER — SODIUM CHLORIDE 0.9 % IV SOLN
INTRAVENOUS | Status: AC
  Administered 2019-03-26: 16:00:00 via INTRAVENOUS

## 2019-03-26 MED ORDER — MIDAZOLAM HCL 2 MG/2ML IJ SOLN
INTRAMUSCULAR | Status: DC | PRN
  Administered 2019-03-26: 1 mg via INTRAVENOUS
  Administered 2019-03-26: 2 mg via INTRAVENOUS

## 2019-03-26 MED ORDER — SODIUM CHLORIDE 0.9 % IV SOLN: 250.0000 mL | INTRAVENOUS | Status: DC | PRN

## 2019-03-26 MED ORDER — IOHEXOL 350 MG/ML SOLN
INTRAVENOUS | Status: DC | PRN
  Administered 2019-03-26: 235 mL via INTRA_ARTERIAL

## 2019-03-26 MED ORDER — HEPARIN (PORCINE) IN NACL 1000-0.9 UT/500ML-% IV SOLN
INTRAVENOUS | Status: AC
  Filled 2019-03-26: qty 1000

## 2019-03-26 MED ORDER — MIDAZOLAM HCL 2 MG/2ML IJ SOLN
INTRAMUSCULAR | Status: AC
  Filled 2019-03-26: qty 2

## 2019-03-26 MED ORDER — NITROGLYCERIN 1 MG/10 ML FOR IR/CATH LAB
INTRA_ARTERIAL | Status: AC
  Filled 2019-03-26: qty 10

## 2019-03-26 MED ORDER — HYDRALAZINE HCL 20 MG/ML IJ SOLN: 10.0000 mg | INTRAMUSCULAR | Status: AC | PRN

## 2019-03-26 MED ORDER — METHYLPREDNISOLONE SODIUM SUCC 125 MG IJ SOLR
INTRAMUSCULAR | Status: AC
  Filled 2019-03-26: qty 2

## 2019-03-26 MED ORDER — HEPARIN SODIUM (PORCINE) 1000 UNIT/ML IJ SOLN
INTRAMUSCULAR | Status: DC | PRN
  Administered 2019-03-26 (×2): 5000 [IU] via INTRAVENOUS
  Administered 2019-03-26: 3000 [IU] via INTRAVENOUS

## 2019-03-26 MED ORDER — FENTANYL CITRATE (PF) 100 MCG/2ML IJ SOLN
INTRAMUSCULAR | Status: DC | PRN
  Administered 2019-03-26: 50 ug via INTRAVENOUS
  Administered 2019-03-26: 25 ug via INTRAVENOUS

## 2019-03-26 MED ORDER — SODIUM CHLORIDE 0.9% FLUSH
3.0000 mL | Freq: Two times a day (BID) | INTRAVENOUS | Status: DC
  Administered 2019-03-27: 3 mL via INTRAVENOUS

## 2019-03-26 MED ORDER — HEPARIN (PORCINE) IN NACL 1000-0.9 UT/500ML-% IV SOLN
INTRAVENOUS | Status: DC | PRN
  Administered 2019-03-26 (×2): 500 mL

## 2019-03-26 MED ORDER — VERAPAMIL HCL 2.5 MG/ML IV SOLN
INTRAVENOUS | Status: AC
  Filled 2019-03-26: qty 2

## 2019-03-26 MED ORDER — DIPHENHYDRAMINE HCL 50 MG/ML IJ SOLN
INTRAMUSCULAR | Status: AC
  Filled 2019-03-26: qty 1

## 2019-03-26 MED ORDER — METHYLPREDNISOLONE SODIUM SUCC 125 MG IJ SOLR
INTRAMUSCULAR | Status: DC | PRN
  Administered 2019-03-26: 125 mg via INTRAVENOUS

## 2019-03-26 MED ORDER — SODIUM CHLORIDE 0.9% FLUSH: 3.0000 mL | INTRAVENOUS | Status: DC | PRN

## 2019-03-26 SURGICAL SUPPLY — 24 items
BALLN SAPPHIRE 2.0X12 (BALLOONS) ×2
BALLN SAPPHIRE 2.5X12 (BALLOONS) ×2
BALLN SAPPHIRE ~~LOC~~ 3.0X15 (BALLOONS) ×1 IMPLANT
BALLN WOLVERINE 2.50X10 (BALLOONS) ×2
BALLN ~~LOC~~ EMERGE MR 3.0X12 (BALLOONS) ×2
BALLOON SAPPHIRE 2.0X12 (BALLOONS) IMPLANT
BALLOON SAPPHIRE 2.5X12 (BALLOONS) IMPLANT
BALLOON WOLVERINE 2.50X10 (BALLOONS) IMPLANT
BALLOON ~~LOC~~ EMERGE MR 3.0X12 (BALLOONS) IMPLANT
CATH 5FR JL3.5 JR4 ANG PIG MP (CATHETERS) ×1 IMPLANT
CATH VISTA GUIDE 6FR XBLAD3.5 (CATHETERS) ×1 IMPLANT
DEVICE RAD COMP TR BAND LRG (VASCULAR PRODUCTS) ×1 IMPLANT
GLIDESHEATH SLEND SS 6F .021 (SHEATH) ×1 IMPLANT
GUIDEWIRE INQWIRE 1.5J.035X260 (WIRE) IMPLANT
INQWIRE 1.5J .035X260CM (WIRE) ×2
KIT ENCORE 26 ADVANTAGE (KITS) ×1 IMPLANT
KIT ESSENTIALS PG (KITS) ×1 IMPLANT
KIT HEART LEFT (KITS) ×2 IMPLANT
PACK CARDIAC CATHETERIZATION (CUSTOM PROCEDURE TRAY) ×2 IMPLANT
STENT SYNERGY DES 2.75X20 (Permanent Stent) ×1 IMPLANT
SYR MEDRAD MARK 7 150ML (SYRINGE) ×2 IMPLANT
TRANSDUCER W/STOPCOCK (MISCELLANEOUS) ×2 IMPLANT
TUBING CIL FLEX 10 FLL-RA (TUBING) ×2 IMPLANT
WIRE COUGAR XT STRL 190CM (WIRE) ×1 IMPLANT

## 2019-03-26 NOTE — Progress Notes (Addendum)
Progress Note  Patient Name: Jeremy [name] Date of Encounter: 03/26/2019  Primary Cardiologist: Jenean Lindau, MD (lives in [place], [place]; prefers f/u w Dr. Johnsie Cancel in [place])  [facility] for cath today. Patient denies chest pain or shortness of breath.   Inpatient Medications    Scheduled Meds: . aspirin EC  81 mg Oral Daily  . carvedilol  12.5 mg Oral BID WC  . clopidogrel  75 mg Oral Daily  . enalapril  10 mg Oral Daily  . gabapentin  300 mg Oral TID  . pantoprazole  40 mg Oral Daily  . rosuvastatin  20 mg Oral q1800  . sodium chloride flush  3 mL Intravenous Q12H  . sodium chloride flush  3 mL Intravenous Q12H   Continuous Infusions: . sodium chloride    . sodium chloride    . sodium chloride 1 mL/kg/hr (03/26/19 0533)  . heparin 1,250 Units/hr (03/25/19 2343)   PRN Meds: sodium chloride, sodium chloride, acetaminophen, nitroGLYCERIN, ondansetron (ZOFRAN) IV, oxyCODONE-acetaminophen **AND** oxyCODONE, prochlorperazine, sodium chloride flush, sodium chloride flush, traMADol   Vital Signs    Vitals:   03/25/19 2312 03/26/19 0500 03/26/19 0546 03/26/19 0756  BP: 138/65  (!) 144/69 127/63  Pulse: 69  63 62  Resp: 16     Temp: 98.4 F (36.9 C)  98.1 F (36.7 C) 98 F (36.7 C)  TempSrc: Oral  Oral Oral  SpO2: 96%  95% 95%  Weight: 94.5 kg 93.7 kg    Height: 5\' 8"  (1.727 m)       Intake/Output Summary (Last 24 hours) at 03/26/2019 0802 Last data filed at 03/26/2019 0533 Gross per 24 hour  Intake 512.26 ml  Output 1 ml  Net 511.26 ml   Last 3 Weights 03/26/2019 03/25/2019 03/25/2019  Weight (lbs) 206 lb 8 oz 208 lb 4.8 oz 210 lb  Weight (kg) 93.668 kg 94.484 kg 95.255 kg      Telemetry    NSR, Hr in the 60s, occasional PVCs, first degreee block PRI 0.22s - Personally Reviewed  ECG    NSR, 62 bpm, old LBBB (QRS 136ms), first degree AV block (PR 232 ms), Twave changes anteriolateral leads from yesterday - Personally Reviewed  Physical Exam   GEN: No acute distress.   Neck: No JVD Cardiac: RRR, no murmurs, rubs, or gallops.  Respiratory: Clear to auscultation bilaterally. GI: Soft, nontender, non-distended  MS: No edema; No deformity. Neuro:  Nonfocal  Psych: Normal affect   Labs    High Sensitivity Troponin:   Recent Labs  Lab 03/25/19 0826 03/25/19 1008 03/25/19 1325 03/25/19 1530  TROPONINIHS 44* 412* 3,138* 5,349*      Chemistry Recent Labs  Lab 03/25/19 0826 03/26/19 0522  NA 137 139  K 4.0 3.6  CL 102 104  CO2 24 27  GLUCOSE 131* 108*  BUN 24* 21  CREATININE 1.44* 1.40*  CALCIUM 9.4 8.9  GFRNONAA 48* 49*  GFRAA 55* 57*  ANIONGAP 11 8     Hematology Recent Labs  Lab 03/25/19 0826 03/26/19 0255  WBC 8.1 4.9  RBC 4.30 3.96*  HGB 13.0 11.9*  HCT 39.7 35.9*  MCV 92.3 90.7  MCH 30.2 30.1  MCHC 32.7 33.1  RDW 12.9 12.6  PLT 175 170    BNPNo results for input(s): BNP, PROBNP in the last 168 hours.   DDimer No results for input(s): DDIMER in the last 168 hours.   Radiology    Dg Chest Portable 1  View  Result Date: 03/25/2019 CLINICAL DATA:  Chest pain and pain in throat EXAM: PORTABLE CHEST 1 VIEW COMPARISON:  January 30, 2019 FINDINGS: No new consolidation or edema. No pleural effusion or pneumothorax. Stable cardiomediastinal contours with normal heart size. Atherosclerotic calcification the aortic arch. Saber trachea is again noted. Significant degenerative changes at the glenohumeral joints. IMPRESSION: No new findings. Electronically Signed   By: Macy Mis M.D.   On: 03/25/2019 08:43    Cardiac Studies   Echo 06/22/18   1. The left ventricle has moderately reduced systolic function of 123456. The cavity size is normal. There is severe left ventricular wall thickness. Echo evidence of pseudonormal diastolic filling patterns.  2. Normal left atrial size.  3. Normal right atrial size.  4. The mitral valve normal in structure. Regurgitation is mild by color flow Doppler.  5.  Normal tricuspid valve.  6. Tricuspid regurgitation is mild.  7. The aortic valve tricuspid. There is mild thickening of the aortic valve.  8. No atrial level shunt detected by color flow Doppler.  Patient Profile     73 y.o. male with history of cath and possible stent x 3 with Dr. Hyman Hopes 10-15 years ago, stroke in February 2020, HTN, HLD, PVD, and recent low risk myoview (03/20/19) who presented to the ER with chest pain, plan for cath today.   Assessment & Plan    NSTEMI Patient presented with chest pain partially relieved by NTG. Hs troponin peaked to 5349 - EKG today shows T wave changes anterior/lateral leads - Plan for cath today Risks and benefits of cardiac catheterization have been discussed with the patient.  These include bleeding, infection, kidney damage, stroke, heart attack, death.  The patient understands these risks and is willing to proceed. - Creatinine slightly elevated 1.40 today. Baseline around 1.2. Monitor closely. Receiving pre-cath fluids. Does not appear to be volume overloaded.  - No recurrent chest pain - Echo from January 2020 showed EF 35-40%, with pseudonormal diastolic filling patterns.  - continue Aspirin and plavix - continue heparin - continue Coreg. Will Hold ACE for elevated kidney function - Further recommendations per cath  HTN - well controlled, BP this AM 127/63 - Home meds enalapril 10 mg daily and coreg 12.5 mg BID. Hold ACE for kidney function  HLD - continue Crestor 20 mg - LDL 113 06/23/18. 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 Today 1.44 - will hold ACE  For questions or updates, please contact [place] Please consult [email or web address] for contact info under        Signed, Cadence Ninfa Meeker, PA-C  03/26/2019, 8:02 AM    I have seen and examined the patient along with Cadence Ninfa Meeker, PA-C .  I have reviewed the chart, notes and new data.  I agree with PA/NP's  note.  Key new complaints: no angina since he [facility]. Has pain R shoulder due to arthritis. Key examination changes: normal CV exam, no evidence of hypervolemia Key new findings / data: ECG nondiagnostic due to LBBB. Echo showed EF 40-45% (TEE 06/2018), but the nuclear QGS was 66% and low risk 03/20/2019  PLAN: Cardiac cath and possible PCI-stent today. This procedure has been fully reviewed with the patient and written informed consent has been obtained.   Sanda Klein, MD, [place] [phone] 03/26/2019, 11:46 AM

## 2019-03-26 NOTE — H&P (View-Only) (Signed)
Progress Note  Patient Name: Jeremy [name] Date of Encounter: 03/26/2019  Primary Cardiologist: Jenean Lindau, MD (lives in [place], [place]; prefers f/u w Dr. Johnsie Cancel in [place])  [place] for cath today. Patient denies chest pain or shortness of breath.   Inpatient Medications    Scheduled Meds: . aspirin EC  81 mg Oral Daily  . carvedilol  12.5 mg Oral BID WC  . clopidogrel  75 mg Oral Daily  . enalapril  10 mg Oral Daily  . gabapentin  300 mg Oral TID  . pantoprazole  40 mg Oral Daily  . rosuvastatin  20 mg Oral q1800  . sodium chloride flush  3 mL Intravenous Q12H  . sodium chloride flush  3 mL Intravenous Q12H   Continuous Infusions: . sodium chloride    . sodium chloride    . sodium chloride 1 mL/kg/hr (03/26/19 0533)  . heparin 1,250 Units/hr (03/25/19 2343)   PRN Meds: sodium chloride, sodium chloride, acetaminophen, nitroGLYCERIN, ondansetron (ZOFRAN) IV, oxyCODONE-acetaminophen **AND** oxyCODONE, prochlorperazine, sodium chloride flush, sodium chloride flush, traMADol   Vital Signs    Vitals:   03/25/19 2312 03/26/19 0500 03/26/19 0546 03/26/19 0756  BP: 138/65  (!) 144/69 127/63  Pulse: 69  63 62  Resp: 16     Temp: 98.4 F (36.9 C)  98.1 F (36.7 C) 98 F (36.7 C)  TempSrc: Oral  Oral Oral  SpO2: 96%  95% 95%  Weight: 94.5 kg 93.7 kg    Height: 5\' 8"  (1.727 m)       Intake/Output Summary (Last 24 hours) at 03/26/2019 0802 Last data filed at 03/26/2019 0533 Gross per 24 hour  Intake 512.26 ml  Output 1 ml  Net 511.26 ml   Last 3 Weights 03/26/2019 03/25/2019 03/25/2019  Weight (lbs) 206 lb 8 oz 208 lb 4.8 oz 210 lb  Weight (kg) 93.668 kg 94.484 kg 95.255 kg      Telemetry    NSR, Hr in the 60s, occasional PVCs, first degreee block PRI 0.22s - Personally Reviewed  ECG    NSR, 62 bpm, old LBBB (QRS 111ms), first degree AV block (PR 232 ms), Twave changes anteriolateral leads from yesterday - Personally Reviewed  Physical Exam   GEN: No acute distress.   Neck: No JVD Cardiac: RRR, no murmurs, rubs, or gallops.  Respiratory: Clear to auscultation bilaterally. GI: Soft, nontender, non-distended  MS: No edema; No deformity. Neuro:  Nonfocal  Psych: Normal affect   Labs    High Sensitivity Troponin:   Recent Labs  Lab 03/25/19 0826 03/25/19 1008 03/25/19 1325 03/25/19 1530  TROPONINIHS 44* 412* 3,138* 5,349*      Chemistry Recent Labs  Lab 03/25/19 0826 03/26/19 0522  NA 137 139  K 4.0 3.6  CL 102 104  CO2 24 27  GLUCOSE 131* 108*  BUN 24* 21  CREATININE 1.44* 1.40*  CALCIUM 9.4 8.9  GFRNONAA 48* 49*  GFRAA 55* 57*  ANIONGAP 11 8     Hematology Recent Labs  Lab 03/25/19 0826 03/26/19 0255  WBC 8.1 4.9  RBC 4.30 3.96*  HGB 13.0 11.9*  HCT 39.7 35.9*  MCV 92.3 90.7  MCH 30.2 30.1  MCHC 32.7 33.1  RDW 12.9 12.6  PLT 175 170    BNPNo results for input(s): BNP, PROBNP in the last 168 hours.   DDimer No results for input(s): DDIMER in the last 168 hours.   Radiology    Dg Chest Portable 1  View  Result Date: 03/25/2019 CLINICAL DATA:  Chest pain and pain in throat EXAM: PORTABLE CHEST 1 VIEW COMPARISON:  January 30, 2019 FINDINGS: No new consolidation or edema. No pleural effusion or pneumothorax. Stable cardiomediastinal contours with normal heart size. Atherosclerotic calcification the aortic arch. Saber trachea is again noted. Significant degenerative changes at the glenohumeral joints. IMPRESSION: No new findings. Electronically Signed   By: Macy Mis M.D.   On: 03/25/2019 08:43    Cardiac Studies   Echo 06/22/18   1. The left ventricle has moderately reduced systolic function of 123456. The cavity size is normal. There is severe left ventricular wall thickness. Echo evidence of pseudonormal diastolic filling patterns.  2. Normal left atrial size.  3. Normal right atrial size.  4. The mitral valve normal in structure. Regurgitation is mild by color flow Doppler.  5.  Normal tricuspid valve.  6. Tricuspid regurgitation is mild.  7. The aortic valve tricuspid. There is mild thickening of the aortic valve.  8. No atrial level shunt detected by color flow Doppler.  Patient Profile     73 y.o. male with history of cath and possible stent x 3 with Dr. Hyman Hopes 10-15 years ago, stroke in February 2020, HTN, HLD, PVD, and recent low risk myoview (03/20/19) who presented to the ER with chest pain, plan for cath today.   Assessment & Plan    NSTEMI Patient presented with chest pain partially relieved by NTG. Hs troponin peaked to 5349 - EKG today shows T wave changes anterior/lateral leads - Plan for cath today Risks and benefits of cardiac catheterization have been discussed with the patient.  These include bleeding, infection, kidney damage, stroke, heart attack, death.  The patient understands these risks and is willing to proceed. - Creatinine slightly elevated 1.40 today. Baseline around 1.2. Monitor closely. Receiving pre-cath fluids. Does not appear to be volume overloaded.  - No recurrent chest pain - Echo from January 2020 showed EF 35-40%, with pseudonormal diastolic filling patterns.  - continue Aspirin and plavix - continue heparin - continue Coreg. Will Hold ACE for elevated kidney function - Further recommendations per cath  HTN - well controlled, BP this AM 127/63 - Home meds enalapril 10 mg daily and coreg 12.5 mg BID. Hold ACE for kidney function  HLD - continue Crestor 20 mg - LDL 113 06/23/18. 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 Today 1.44 - will hold ACE  For questions or updates, please contact [place] Please consult [email or web address] for contact info under        Signed, Cadence Ninfa Meeker, PA-C  03/26/2019, 8:02 AM    I have seen and examined the patient along with Cadence Ninfa Meeker, PA-C .  I have reviewed the chart, notes and new data.  I agree with PA/NP's  note.  Key new complaints: no angina since he arrived at [facility]. Has pain R shoulder due to arthritis. Key examination changes: normal CV exam, no evidence of hypervolemia Key new findings / data: ECG nondiagnostic due to LBBB. Echo showed EF 40-45% (TEE 06/2018), but the nuclear QGS was 66% and low risk 03/20/2019  PLAN: Cardiac cath and possible PCI-stent today. This procedure has been fully reviewed with the patient and written informed consent has been obtained.   Sanda Klein, MD, [place] [phone] 03/26/2019, 11:46 AM

## 2019-03-26 NOTE — Plan of Care (Signed)
  Problem: Education: Goal: Knowledge of General Education information will improve Description: Including pain rating scale, medication(s)/side effects and non-pharmacologic comfort measures Outcome: Progressing   Problem: Health Behavior/Discharge Planning: Goal: Ability to manage health-related needs will improve Outcome: Progressing   Problem: Clinical Measurements: Goal: Will remain free from infection Outcome: Progressing   Problem: Activity: Goal: Risk for activity intolerance will decrease Outcome: Progressing   Problem: Coping: Goal: Level of anxiety will decrease Outcome: Progressing   

## 2019-03-26 NOTE — Interval H&P Note (Signed)
History and Physical Interval Note:  03/26/2019 2:37 PM  Jeremy [name]  has presented today for cardiac cath with the diagnosis of NSTEMI.  The various methods of treatment have been discussed with the patient and family. After consideration of risks, benefits and other options for treatment, the patient has consented to  Procedure(s): LEFT HEART CATH AND CORONARY ANGIOGRAPHY (N/A) as a surgical intervention.  The patient's history has been reviewed, patient examined, no change in status, stable for surgery.  I have reviewed the patient's chart and labs.  Questions were answered to the patient's satisfaction.    Cath Lab Visit (complete for each Cath Lab visit)  Clinical Evaluation Leading to the Procedure:   ACS: Yes.    Non-ACS:    Anginal Classification: CCS III  Anti-ischemic medical therapy: Minimal Therapy (1 class of medications)  Non-Invasive Test Results: No non-invasive testing performed  Prior CABG: No previous CABG         Jeremy [name]

## 2019-03-26 NOTE — Progress Notes (Signed)
[place] for heparin Indication: chest pain/ACS/STEMI  Allergies  Allergen Reactions  . Codeine Anaphylaxis  . Oxycontin [Oxycodone Hcl] Shortness Of Breath and Itching    Through IV- has since had oral oxycodone without issue  . Contrast Media [Iodinated Diagnostic Agents]     Patient got very nauseated. Gave 4mg  Zofran. Possibly pre medicate with Zofran   . Morphine And Related Itching    Patient Measurements: Height: 5\' 8"  (172.7 cm) Weight: 206 lb 8 oz (93.7 kg) IBW/kg (Calculated) : 68.4 Heparin Dosing Weight: 86 kg  Vital Signs: Temp: 98 F (36.7 C) (11/03 0756) Temp Source: Oral (11/03 0756) BP: 127/63 (11/03 0756) Pulse Rate: 62 (11/03 0756)  Labs: Recent Labs    03/25/19 0826 03/25/19 1008 03/25/19 1325 03/25/19 1530 03/25/19 2206 03/26/19 0255 03/26/19 0522 03/26/19 0659  HGB 13.0  --   --   --   --  11.9*  --   --   HCT 39.7  --   --   --   --  35.9*  --   --   PLT 175  --   --   --   --  170  --   --   APTT  --   --  28  --   --   --   --   --   LABPROT  --   --  14.4  --   --   --   --   --   INR  --   --  1.1  --   --   --   --   --   HEPARINUNFRC  --   --   --   --  0.27*  --   --  0.49  CREATININE 1.44*  --   --   --   --   --  1.40*  --   TROPONINIHS 44* 412* 3,138* 5,349*  --   --   --   --     Estimated Creatinine Clearance: 52.2 mL/min (A) (by C-G formula based on SCr of 1.4 mg/dL (H)).   Assessment: 73 yo male with NSTEMI and pharmacy dosing heparin.  Patient is not on anticoagulation prior to admission. Plans noted for cath today -heparin level at goal  Goal of Therapy:  Heparin level 0.3-0.7 units/ml Monitor platelets by anticoagulation protocol: Yes   Plan:  -No heparin changes needed -Will follow cath plans today  Hildred Laser, PharmD Clinical Pharmacist **Pharmacist phone directory can now be found on [email or web address] ([email or web address]).  Listed under [place].

## 2019-03-27 ENCOUNTER — Encounter (HOSPITAL_COMMUNITY): Payer: Self-pay | Admitting: Cardiovascular Disease

## 2019-03-27 LAB — BASIC METABOLIC PANEL
Anion gap: 9 (ref 5–15)
BUN: 23 mg/dL (ref 8–23)
CO2: 23 mmol/L (ref 22–32)
Calcium: 8.9 mg/dL (ref 8.9–10.3)
Chloride: 106 mmol/L (ref 98–111)
Creatinine, Ser: 1.3 mg/dL — ABNORMAL HIGH (ref 0.61–1.24)
GFR calc Af Amer: >60 mL/min (ref >60)
GFR calc non Af Amer: 54 mL/min — ABNORMAL LOW (ref >60)
Glucose, Bld: 160 mg/dL — ABNORMAL HIGH (ref 70–99)
Potassium: 4.3 mmol/L (ref 3.5–5.1)
Sodium: 138 mmol/L (ref 135–145)

## 2019-03-27 LAB — CBC
HCT: 36.8 % — ABNORMAL LOW (ref 39.0–52.0)
Hemoglobin: 12.3 g/dL — ABNORMAL LOW (ref 13.0–17.0)
MCH: 30 pg (ref 26.0–34.0)
MCHC: 33.4 g/dL (ref 30.0–36.0)
MCV: 89.8 fL (ref 80.0–100.0)
Platelets: 184 10*3/uL (ref 150–400)
RBC: 4.1 MIL/uL — ABNORMAL LOW (ref 4.22–5.81)
RDW: 12.4 % (ref 11.5–15.5)
WBC: 6.7 10*3/uL (ref 4.0–10.5)
nRBC: 0 % (ref 0.0–0.2)

## 2019-03-27 MED ORDER — OXYCODONE-ACETAMINOPHEN 7.5-325 MG PO TABS: 1.0000 | ORAL_TABLET | ORAL | 0 refills | Status: AC | PRN

## 2019-03-27 MED ORDER — NITROGLYCERIN 0.4 MG SL SUBL: 0.4000 mg | SUBLINGUAL_TABLET | SUBLINGUAL | 0 refills | Status: DC | PRN

## 2019-03-27 MED ORDER — ASPIRIN 81 MG PO TBEC: 81.0000 mg | DELAYED_RELEASE_TABLET | Freq: Every day | ORAL | 3 refills | Status: AC

## 2019-03-27 MED FILL — Nitroglycerin IV Soln 100 MCG/ML in D5W: INTRA_ARTERIAL | Qty: 10 | Status: AC

## 2019-03-27 NOTE — Progress Notes (Signed)
CARDIAC REHAB PHASE I   PRE:  Rate/Rhythm: 80 SR  BP:  Sitting: 148/62      SaO2: 96 RA  MODE:  Ambulation: 470 ft   POST:  Rate/Rhythm: 93 SR  BP:  Sitting: 172/73    SaO2: 97 RA  Pt ambulated 432ft in hallway independently with steady gait. Pt denies CP or SOB, states breathing feels "clearer". Pt educated on importance of ASA, Plavix, and NTG. Pt given MI book, stent card, and heart healthy diet. Reviewed restrictions, site care, and exercise guidelines. Will refer to CRP II [place]. Pt requesting a new prescription for NTG, and that meds be sent to [facility] in [place], [place].   [place] Rufina Falco, RN BSN 03/27/2019 9:08 AM

## 2019-03-27 NOTE — Plan of Care (Signed)
  Problem: Education: Goal: Knowledge of General Education information will improve Description: Including pain rating scale, medication(s)/side effects and non-pharmacologic comfort measures Outcome: Progressing   Problem: Clinical Measurements: Goal: Ability to maintain clinical measurements within normal limits will improve Outcome: Progressing   Problem: Activity: Goal: Risk for activity intolerance will decrease Outcome: Progressing   Problem: Nutrition: Goal: Adequate nutrition will be maintained Outcome: Progressing   Problem: Coping: Goal: Level of anxiety will decrease Outcome: Progressing   Problem: Safety: Goal: Ability to remain free from injury will improve Outcome: Progressing   

## 2019-03-27 NOTE — Discharge Summary (Addendum)
Discharge Summary    Patient ID: Jeremy [name] MRN: [id number]; DOB: 05-24-1945  Admit date: 03/25/2019 Discharge date: 03/27/2019  Primary Care Provider: Celene Squibb, MD  Primary Cardiologist: Jenean Lindau, MD  Primary Electrophysiologist:  None   Discharge Diagnoses    Principal Problem:   NSTEMI (non-ST elevated myocardial infarction) [facility]) Active Problems:   Mixed dyslipidemia   Essential hypertension   CAD (coronary artery disease) DES pLAD 03/25/19   CKD (chronic kidney disease), stage III   ACS (acute coronary syndrome) ([place])   Unstable angina [facility])    Diagnostic Studies/Procedures    LHC 03/27/2019  Prox RCA lesion is 50% stenosed.  Dist RCA lesion is 60% stenosed.  RPDA lesion is 50% stenosed.  Prox Cx to Mid Cx lesion is 90% stenosed.  Prox LAD lesion is 99% stenosed.  Scoring balloon angioplasty was performed using a BALLOON WOLVERINE 2.50X10.  Post intervention, there is a 20% residual stenosis.  A drug-eluting stent was successfully placed using a STENT SYNERGY DES 2.75X20.  Post intervention, there is a 0% residual stenosis.  Dist Cx lesion is 70% stenosed.  2nd Mrg lesion is 60% stenosed.  2nd Diag lesion is 80% stenosed.  The left ventricular systolic function is normal.  LV end diastolic pressure is normal.  The left ventricular ejection fraction is 50-55% by visual estimate.  There is no mitral valve regurgitation.  1. Severe stenosis proximal LAD. Successful PTCA/DES x 1 proximal LAD 2. Severe restenosis in the mid Circumflex stented segment. Successful scoring balloon angioplasty within the old stented segment. No stent placed.  3. Moderate stenosis in the distal Circumflex branches.  4. Moderate restenosis within the old mid RCA stent. This does not appear to be flow limiting. Moderate distal RCA and PDA stenosis. 5. Low normal LV systolic function, LVEF around 50%.   Recommendations: DAPT with ASA and Plavix for at least  one year. Continue beta blocker and statin.   regurgitation.  Coronary Diagrams  Diagnostic Dominance: Right  Intervention    _____________   History of Present Illness     Jeremy [name] is a 73 y.o. male with history of stroke, MI, CAD with stents by Dr. Lia Foyer more than 10 years ago, HTN, HLD, . He was seen in 01/2019 by Dr. Geraldo Pitter for pre-op evaluation and had a low-risk myoview. Patient saw Dr. Geraldo Pitter on 03/19/19 for pre-op for shoulder surgery. Lexiscan was ordered at that time.   The patient presented to the ER 03/25/19  for chest and throat pain. Pain was moderate and nothing seemed to make it worse. Denied nausea, vomiting, diaphoresis, and SOB. He took 3 NTG at home with partial relief. He has significant reflux as well. When the pain did not subside the patient went to the ER at [facility].  Hospital Course     Consultants: N/A  In the ED vitals were stable. Labs showed glucose 131, creatinine 1.44. CBC unremarkable. Initial troponin was 44, and increased to 412. CXR with no acute disease. EKG showed incomplete LBBB.  HS troponin peaked at 5,345. IV heparin was initiated and cardiology was consulted. The patient was then transferred to [facility] for cardiac catheterization. Upon arrival to [facility] patient was chest pain free. Enalipril was held for worsening kidney function. Coreg and Crestor were continued. The patient was taken to the cath lab and right radial access was established. Cath showed  50% stenosis prox RCA, 60% stenosis dist RCA, 50% stenosis RPDA, 90% stenosis prox to mid  Cx, 99% stenosis prox LAD, 70% stenosis Dist Cx, 90^ stenosis 2nd mrg, 80% stenosis 2nd daig, normal LVEDP, EF 50-55%. Treated with PTCA/DES x 1 proximal LAD and balloon angioplasty within the stent mid Cx. Post cath recommendations were for DAPT ASA and Plavix x 1 year with continuation of BB and statin. LDL this admission was 36. BP remained stable. Creatinine improved 1.20 > 1.30. Cath site right  radial remained clean and dry with no signs of hematoma. Hgb 12.3. Patient worked with cardiac rehab and ambulated without symptoms. Will plan to discharge on Aspirin and Plavix x 1 year. Continue Crestor 20 mg and Coreg 12.5mg  BID. Will restart home Enalapril 10 mg daily at discharge. Recommend the patient delay elective shoulder surgery for at least 6 months (preferably 12 months). Recommendation to avoid NSAIDS. Will give 15 percocet for shoulder pain.   The patient was evaluated on 03/27/2019 br Dr. Orene Desanctis, and felt stable for discharge. Follow up was scheduled.   Did the patient have an acute coronary syndrome (MI, NSTEMI, STEMI, etc) this admission?:  Yes                               AHA/ACC Clinical Performance & Quality Measures: 1. Aspirin prescribed? - Yes 2. ADP Receptor Inhibitor (Plavix/Clopidogrel, Brilinta/Ticagrelor or Effient/Prasugrel) prescribed (includes medically managed patients)? - Yes 3. Beta Blocker prescribed? - Yes 4. High Intensity Statin (Lipitor 40-80mg  or Crestor 20-40mg ) prescribed? - Yes 5. EF assessed during THIS hospitalization? - Yes 6. For EF <40%, was ACEI/ARB prescribed? - Not Applicable (EF >/= [id number]) 7. For EF <40%, Aldosterone Antagonist (Spironolactone or Eplerenone) prescribed? - Not Applicable (EF >/= [id number]) 8. Cardiac Rehab Phase II ordered (Included Medically managed Patients)? - Yes   _____________  Discharge Vitals Blood pressure (!) 159/63, pulse 73, temperature 97.6 F (36.4 C), temperature source Oral, resp. rate 15, height 5\' 8"  (1.727 m), weight 93.5 kg, SpO2 96 %.  Filed Weights   03/25/19 2312 03/26/19 0500 03/27/19 0656  Weight: 94.5 kg 93.7 kg 93.5 kg    Labs & Radiologic Studies    CBC Recent Labs    03/25/19 0826 03/26/19 0255 03/27/19 0254  WBC 8.1 4.9 6.7  NEUTROABS 6.7  --   --   HGB 13.0 11.9* 12.3*  HCT 39.7 35.9* 36.8*  MCV 92.3 90.7 89.8  PLT 175 170 [id number]   Basic Metabolic Panel Recent Labs    03/26/19 0522  03/27/19 0254  NA 139 138  K 3.6 4.3  CL 104 106  CO2 27 23  GLUCOSE 108* 160*  BUN 21 23  CREATININE 1.40* 1.30*  CALCIUM 8.9 8.9   Liver Function Tests No results for input(s): AST, ALT, ALKPHOS, BILITOT, PROT, ALBUMIN in the last 72 hours. No results for input(s): LIPASE, AMYLASE in the last 72 hours. High Sensitivity Troponin:   Recent Labs  Lab 03/25/19 0826 03/25/19 1008 03/25/19 1325 03/25/19 1530  TROPONINIHS 44* 412* 3,138* 5,349*    BNP Invalid input(s): POCBNP D-Dimer No results for input(s): DDIMER in the last 72 hours. Hemoglobin A1C No results for input(s): HGBA1C in the last 72 hours. Fasting Lipid Panel Recent Labs    03/26/19 0522  CHOL 87  HDL 32*  LDLCALC 36  TRIG 95  CHOLHDL 2.7   Thyroid Function Tests No results for input(s): TSH, T4TOTAL, T3FREE, THYROIDAB in the last 72 hours.  Invalid input(s): FREET3 _____________  Ct Shoulder Right Wo  Contrast  Result Date: 03/13/2019 CLINICAL DATA:  Preop for total shoulder arthroplasty. EXAM: CT OF THE UPPER RIGHT EXTREMITY WITHOUT CONTRAST TECHNIQUE: Multidetector CT imaging of the upper right extremity was performed according to the standard protocol. COMPARISON:  None. FINDINGS: Severe glenohumeral joint degenerative changes with full-thickness cartilage loss, marked joint space narrowing, osteophytic spurring, bony eburnation and subchondral cystic change. There is mild widening and thinning of the glenoid. No acute bony findings or AVN. Advanced AC joint degenerative changes are also noted with inferior spurring from the distal clavicle. Grossly the rotator cuff tendons are intact. No obvious large full-thickness retracted tear. Marked spurring changes around the bicipital groove for noted. The visualized right ribs are intact and the visualized right lung is grossly clear. IMPRESSION: 1. Severe glenohumeral joint degenerative changes as detailed above. 2. Advanced AC joint degenerative changes. 3.  Grossly intact rotator cuff tendons. Electronically Signed   By: Marijo Sanes M.D.   On: 03/13/2019 10:22   Dg Chest Portable 1 View  Result Date: 03/25/2019 CLINICAL DATA:  Chest pain and pain in throat EXAM: PORTABLE CHEST 1 VIEW COMPARISON:  January 30, 2019 FINDINGS: No new consolidation or edema. No pleural effusion or pneumothorax. Stable cardiomediastinal contours with normal heart size. Atherosclerotic calcification the aortic arch. Saber trachea is again noted. Significant degenerative changes at the glenohumeral joints. IMPRESSION: No new findings. Electronically Signed   By: Macy Mis M.D.   On: 03/25/2019 08:43   Disposition   Pt is being discharged home today in good condition.  Follow-up Plans & Appointments    Follow-up Information    Erma Heritage, PA-C Follow up on 04/11/2019.   Specialties: Physician Assistant, Cardiology Why: Please go to hospital follow-up Novembe the 19th at 3:30 PM Contact information: [place] [address] [phone]          Discharge Instructions    Amb Referral to Cardiac Rehabilitation   Complete by: As directed    Diagnosis:  Coronary Stents NSTEMI     After initial evaluation and assessments completed: Virtual Based Care may be provided alone or in conjunction with Phase 2 Cardiac Rehab based on patient barriers.: Yes      Discharge Medications   Allergies as of 03/27/2019      Reactions   Codeine Anaphylaxis   Oxycontin [oxycodone Hcl] Shortness Of Breath, Itching   Through IV- has since had oral oxycodone without issue   Contrast Media [iodinated Diagnostic Agents]    Patient got very nauseated. Gave 4mg  Zofran. Possibly pre medicate with Zofran    Morphine And Related Itching      Medication List    TAKE these medications   aspirin 81 MG EC tablet Take 1 tablet (81 mg total) by mouth daily. Start taking on: March 28, 2019   carvedilol 12.5 MG tablet Commonly known as: COREG Take 1  tablet (12.5 mg total) by mouth 2 (two) times daily with a meal.   clopidogrel 75 MG tablet Commonly known as: PLAVIX Take 1 tablet by mouth daily.   enalapril 10 MG tablet Commonly known as: VASOTEC Take 10 mg by mouth daily.   gabapentin 300 MG capsule Commonly known as: NEURONTIN Take 1 capsule (300 mg total) by mouth 3 (three) times daily.   nitroGLYCERIN 0.4 MG SL tablet Commonly known as: NITROSTAT Place 1 tablet (0.4 mg total) under the tongue every 5 (five) minutes as needed for chest pain.   omeprazole 40 MG capsule Commonly known as:  PRILOSEC TAKE 1 CAPSULE BY MOUTH ONCE DAILY FOR REFLUX   oxyCODONE-acetaminophen 7.5-325 MG tablet Commonly known as: PERCOCET Take 1 tablet by mouth 3 (three) times daily as needed. for pain What changed: Another medication with the same name was added. Make sure you understand how and when to take each.   oxyCODONE-acetaminophen 7.5-325 MG tablet Commonly known as: Percocet Take 1 tablet by mouth every 4 (four) hours as needed for severe pain. What changed: You were already taking a medication with the same name, and this prescription was added. Make sure you understand how and when to take each.   prochlorperazine 10 MG tablet Commonly known as: COMPAZINE Take 1 tablet (10 mg total) by mouth 2 (two) times daily as needed (Headache).   rosuvastatin 20 MG tablet Commonly known as: CRESTOR Take 1 tablet (20 mg total) by mouth daily at 6 PM.   traMADol 50 MG tablet Commonly known as: ULTRAM Take 1 tablet by mouth every 6 (six) hours as needed.          Outstanding Labs/Studies   None  Duration of Discharge Encounter   Greater than 30 minutes including physician time.  Signed, Lynk Marti Ninfa Meeker, PA-C 03/27/2019, 1:34 PM

## 2019-03-27 NOTE — Progress Notes (Addendum)
Progress Note  Patient Name: Jeremy [name] Date of Encounter: 03/27/2019  Primary Cardiologist: Jenean Lindau, MD   Subjective   Cath yesterday. Patient is feeling great today. No CP or SOB. Ambulating without symptoms. Cath site, right radial, is clean and dry.   Inpatient Medications    Scheduled Meds: . aspirin EC  81 mg Oral Daily  . carvedilol  12.5 mg Oral BID WC  . clopidogrel  75 mg Oral Daily  . gabapentin  300 mg Oral TID  . pantoprazole  40 mg Oral Daily  . rosuvastatin  20 mg Oral q1800  . sodium chloride flush  3 mL Intravenous Q12H  . sodium chloride flush  3 mL Intravenous Q12H   Continuous Infusions: . sodium chloride    . sodium chloride     PRN Meds: sodium chloride, sodium chloride, acetaminophen, nitroGLYCERIN, ondansetron (ZOFRAN) IV, oxyCODONE-acetaminophen **AND** oxyCODONE, prochlorperazine, sodium chloride flush, sodium chloride flush, traMADol   Vital Signs    Vitals:   03/26/19 1735 03/26/19 2004 03/27/19 0656 03/27/19 0756  BP: (!) 163/73 (!) 131/57 (!) 161/72 (!) 159/63  Pulse: 75 74 76 73  Resp:      Temp:  98.4 F (36.9 C) 97.6 F (36.4 C)   TempSrc:  Oral Oral   SpO2: 95% 93% 96%   Weight:   93.5 kg   Height:        Intake/Output Summary (Last 24 hours) at 03/27/2019 0835 Last data filed at 03/27/2019 0500 Gross per 24 hour  Intake 1967.99 ml  Output -  Net 1967.99 ml   Last 3 Weights 03/27/2019 03/26/2019 03/25/2019  Weight (lbs) 206 lb 1.6 oz 206 lb 8 oz 208 lb 4.8 oz  Weight (kg) 93.486 kg 93.668 kg 94.484 kg      Telemetry    NSR, HR 70s, occasional PACs, first degree AV block PRI 0.22s - Personally Reviewed  ECG    NSR, 1st degree AV block PRI 253ms, diffuse TWI - Personally Reviewed  Physical Exam   GEN: No acute distress.   Neck: No JVD Cardiac: RRR, no murmurs, rubs, or gallops.  Respiratory: Clear to auscultation bilaterally. GI: Soft, nontender, non-distended  MS: No edema; No deformity; right radial  clean and dry without hematoma Neuro:  Nonfocal  Psych: Normal affect   Labs    High Sensitivity Troponin:   Recent Labs  Lab 03/25/19 0826 03/25/19 1008 03/25/19 1325 03/25/19 1530  TROPONINIHS 44* 412* 3,138* 5,349*      Chemistry Recent Labs  Lab 03/25/19 0826 03/26/19 0522 03/27/19 0254  NA 137 139 138  K 4.0 3.6 4.3  CL 102 104 106  CO2 24 27 23   GLUCOSE 131* 108* 160*  BUN 24* 21 23  CREATININE 1.44* 1.40* 1.30*  CALCIUM 9.4 8.9 8.9  GFRNONAA 48* 49* 54*  GFRAA 55* 57* >60  ANIONGAP 11 8 9      Hematology Recent Labs  Lab 03/25/19 0826 03/26/19 0255 03/27/19 0254  WBC 8.1 4.9 6.7  RBC 4.30 3.96* 4.10*  HGB 13.0 11.9* 12.3*  HCT 39.7 35.9* 36.8*  MCV 92.3 90.7 89.8  MCH 30.2 30.1 30.0  MCHC 32.7 33.1 33.4  RDW 12.9 12.6 12.4  PLT 175 170 184    BNPNo results for input(s): BNP, PROBNP in the last 168 hours.   DDimer No results for input(s): DDIMER in the last 168 hours.   Radiology    Dg Chest Portable 1 View  Result Date: 03/25/2019 CLINICAL  DATA:  Chest pain and pain in throat EXAM: PORTABLE CHEST 1 VIEW COMPARISON:  January 30, 2019 FINDINGS: No new consolidation or edema. No pleural effusion or pneumothorax. Stable cardiomediastinal contours with normal heart size. Atherosclerotic calcification the aortic arch. Saber trachea is again noted. Significant degenerative changes at the glenohumeral joints. IMPRESSION: No new findings. Electronically Signed   By: Macy Mis M.D.   On: 03/25/2019 08:43    Cardiac Studies   LHC 03/27/2019  Prox RCA lesion is 50% stenosed.  Dist RCA lesion is 60% stenosed.  RPDA lesion is 50% stenosed.  Prox Cx to Mid Cx lesion is 90% stenosed.  Prox LAD lesion is 99% stenosed.  Scoring balloon angioplasty was performed using a BALLOON WOLVERINE 2.50X10.  Post intervention, there is a 20% residual stenosis.  A drug-eluting stent was successfully placed using a STENT SYNERGY DES 2.75X20.  Post  intervention, there is a 0% residual stenosis.  Dist Cx lesion is 70% stenosed.  2nd Mrg lesion is 60% stenosed.  2nd Diag lesion is 80% stenosed.  The left ventricular systolic function is normal.  LV end diastolic pressure is normal.  The left ventricular ejection fraction is 50-55% by visual estimate.  There is no mitral valve regurgitation.   1. Severe stenosis proximal LAD. Successful PTCA/DES x 1 proximal LAD 2. Severe restenosis in the mid Circumflex stented segment. Successful scoring balloon angioplasty within the old stented segment. No stent placed.  3. Moderate stenosis in the distal Circumflex branches.  4. Moderate restenosis within the old mid RCA stent. This does not appear to be flow limiting. Moderate distal RCA and PDA stenosis. 5. Low normal LV systolic function, LVEF around 50%.   Recommendations: DAPT with ASA and Plavix for at least one year. Continue beta blocker and statin.   regurgitation.  Coronary Diagrams  Diagnostic Dominance: Right  Intervention      Echo 06/22/18  1. The left ventricle has moderately reduced systolic function of 123456. The cavity size is normal. There is severe left ventricular wall thickness. Echo evidence of pseudonormal diastolic filling patterns. 2. Normal left atrial size. 3. Normal right atrial size. 4. The mitral valve normal in structure. Regurgitation is mild by color flow Doppler. 5. Normal tricuspid valve. 6. Tricuspid regurgitation is mild. 7. The aortic valve tricuspid. There is mild thickening of the aortic valve. 8. No atrial level shunt detected by color flow Doppler.  Patient Profile     73 y.o. male history of cath and possible stent x 3 with Dr. Hyman Hopes 10-15 years ago, stroke in February 2020, HTN, HLD, PVD, and recent low risk myoview (03/20/19) who presented to the ER with chest pain who underwent cath yesterday.  Assessment & Plan    NSTEMI/CAD s/p DES x 1 prox LAD Patient presented  with chest pain partially relieved by NTG. Hs troponin peaked to 5349. EKG had some evolcing T wave changes anterior/lateral leads - Cath yesterday patient had PCI DES x 1 pLAD and ballon angioplasty of stent in mid Cx with successful balloon angioplasty - Echo from January 2020 showed EF 35-40%, with pseudonormal diastolic filling patterns.  - continue Aspirin and plavix x 1 year - continue BB and statin - Creatinine better this AM, 1.40 > 1.30. Will need outpatient labs to recheck. 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 ACE held. Level improving, 1.3 today  For questions or updates, please contact [place] Please consult [email or web address] for contact info under        Signed, Cadence Ninfa Meeker, PA-C  03/27/2019, 8:35 AM    I have seen and examined the patient along with Cadence Ninfa Meeker, PA-C.   I have reviewed the chart, notes and new data.  I agree with PA/NP's note.  Key new complaints: feels much better - no angina or dyspnea with brisk walk Key examination changes: no bleeding complications at arterial access site Key new findings / data: reviewed angiographic results  PLAN: Discussed mandatory dual antiplatelet Rx for at least 6 months, preferably 12 months. Delay elective shoulder surgery until then. Try to avoid NSAIDs. He received percocet for his R shoulder pain and has noticed that it does not cause the pruritus he experiences with oxycodone and morphine. Will give him a short supply, further refills w PCP. Excellent LDL. Restart home BP meds. Prefers F/U in [place]  (lives in [place], [place]).  Sanda Klein, MD, [place] [phone] 03/27/2019, 10:45 AM

## 2019-04-02 ENCOUNTER — Telehealth: Payer: Self-pay | Admitting: Internal Medicine

## 2019-04-02 MED ORDER — HYDROCORTISONE ACETATE 25 MG RE SUPP: 25.0000 mg | Freq: Every day | RECTAL | 0 refills | Status: DC

## 2019-04-02 NOTE — Telephone Encounter (Signed)
Pt states he has had a fissure in the past but when he had procedure he was told he did not have a tear or hemorrhoid. States that he is taking colace but it is not helping with his stool. Pt states his stools stay so hard it just burns like crazy when he passes the stool and it burns for the rest of the day. Pt wants to know what Dr. Henrene Pastor recommends. Please advise.

## 2019-04-02 NOTE — Telephone Encounter (Signed)
1.  Recommend MiraLAX daily.  Start with 1 dose.  If that not effective increase until the effective dosage found.  This should soften his stools 2.  Prescribe Anusol HC suppositories or the equivalent.  Dispense 30.  1 at night as needed for rectal discomfort and burning

## 2019-04-02 NOTE — Telephone Encounter (Signed)
Spoke with pt and he is aware and script sent to pharmacy. 

## 2019-04-03 ENCOUNTER — Other Ambulatory Visit: Payer: Self-pay

## 2019-04-11 ENCOUNTER — Ambulatory Visit (INDEPENDENT_AMBULATORY_CARE_PROVIDER_SITE_OTHER): Payer: PPO | Admitting: Student

## 2019-04-11 ENCOUNTER — Other Ambulatory Visit: Payer: Self-pay

## 2019-04-11 ENCOUNTER — Encounter: Payer: Self-pay | Admitting: Student

## 2019-04-11 VITALS — BP 120/67 | HR 64 | Temp 98.0°F | Ht 68.0 in | Wt 203.0 lb

## 2019-04-11 DIAGNOSIS — I2510 Atherosclerotic heart disease of native coronary artery without angina pectoris: Secondary | ICD-10-CM | POA: Diagnosis not present

## 2019-04-11 DIAGNOSIS — I10 Essential (primary) hypertension: Secondary | ICD-10-CM

## 2019-04-11 DIAGNOSIS — Z8673 Personal history of transient ischemic attack (TIA), and cerebral infarction without residual deficits: Secondary | ICD-10-CM | POA: Diagnosis not present

## 2019-04-11 DIAGNOSIS — E785 Hyperlipidemia, unspecified: Secondary | ICD-10-CM | POA: Diagnosis not present

## 2019-04-11 DIAGNOSIS — I214 Non-ST elevation (NSTEMI) myocardial infarction: Secondary | ICD-10-CM

## 2019-04-11 NOTE — Progress Notes (Signed)
Cardiology Office Note    Date:  04/11/2019   ID:  Jeremy [name], DOB 05/19/1946, MRN [id number]  PCP:  Celene Squibb, MD  Cardiologist: Jenean Lindau, MD  --> now wishes to follow-up in [facility] (evaluated by Dr. Johnsie Cancel during admission)  Chief Complaint  Patient presents with  . Hospitalization Follow-up    s/p NSTEMI    History of Present Illness:    Jeremy [name] is a 73 y.o. male with past medical history of CAD (s/p prior stenting of LAD and RCA), HTN,HLD, and prior CVA who presents to the office today for hospital follow-up.  He most recently presented to [facility]  ED on 03/25/2019 for evaluation of worsening chest pain and throat discomfort which had started the day of admission. EKG showed an incomplete left bundle branch block but he was found to have an NSTEMI with peak troponin of 5345. He was transferred to [facility] for a cardiac catheterization. This was performed by Dr. Angelena Form on 03/26/2019 and showed 50% prox-RCA stenosis, 60% dRCA, 50% RPDA, 90% prox LCx, 99% Prox-LAD, 70% Distal LCx, 60% 2nd Mrg, 80% 2nd Diagonal and preserved EF of 50-55%. Was treated with PTCA/DESx1 to the LAD and successful scoring balloon angioplasty in the previously stented segment of LCx. He was continued on ASA and Plavix with recommendations to continue this for at least 1 year along with beta-blocker and statin therapy.  In talking with the patient and his wife today, he reports overall doing very well since his recent hospitalization. He denies any recurrent episodes of chest pain. His breathing and fatigue have significantly improved. He was supposed to be undergoing shoulder surgery earlier this month but is aware this has been delayed for at least 6 months given the need for uninterrupted DAPT. He denies missing any recent doses of ASA or Plavix.  No recent orthopnea, PND, lower extremity edema or palpitations. BP is well controlled at 120/67 during today's visit.  He did  restart Enalapril following hospital discharge and is scheduled to see his PCP next week and requests to have repeat labs at that time.   Past Medical History:  Diagnosis Date  . Arthritis   . CAD (coronary artery disease)    a. s/p prior stenting of LAD and RCA b. 03/2019: NSTEMI and required PTCA/DESx1 to the LAD and successful scoring balloon angioplasty in the previously stented segment of LCx  . Carotid stenosis, asymptomatic, right   . Carpal tunnel syndrome    bilateral  . Coronary artery disease   . GERD (gastroesophageal reflux disease)   . History of kidney stones   . Hx of colonic polyp   . Hypercholesterolemia   . Hypertension   . Myocardial infarction ([place])    hx of  . Obesity   . Stroke [facility])     Past Surgical History:  Procedure Laterality Date  . CARDIAC CATHETERIZATION    . CORONARY BALLOON ANGIOPLASTY N/A 03/26/2019   Procedure: CORONARY BALLOON ANGIOPLASTY;  Surgeon: Burnell Blanks, MD;  Location: [place] CV [facility];  Service: Cardiovascular;  Laterality: N/A;  . CORONARY STENT INTERVENTION N/A 03/26/2019   Procedure: CORONARY STENT INTERVENTION;  Surgeon: Burnell Blanks, MD;  Location: [place] [facility];  Service: Cardiovascular;  Laterality: N/A;  . CORONARY STENT PLACEMENT  2000   x3   . CYST EXCISION     x 2  . ENDARTERECTOMY Right 12/25/2017   Procedure: ENDARTERECTOMY CAROTID RIGHT;  Surgeon: Curt Jews  F, MD;  Location: [place];  Service: Vascular;  Laterality: Right;  . LEFT HEART CATH AND CORONARY ANGIOGRAPHY N/A 03/26/2019   Procedure: LEFT HEART CATH AND CORONARY ANGIOGRAPHY;  Surgeon: Burnell Blanks, MD;  Location: [place] [facility];  Service: Cardiovascular;  Laterality: N/A;  . PATCH ANGIOPLASTY Right 12/25/2017   Procedure: PATCH ANGIOPLASTY RIGHT CAROTID ARTERY;  Surgeon: Rosetta Posner, MD;  Location: [place];  Service: Vascular;  Laterality: Right;  . TEE WITHOUT CARDIOVERSION N/A 06/29/2018   Procedure: TRANSESOPHAGEAL  ECHOCARDIOGRAM (TEE);  Surgeon: Fay Records, MD;  Location: [facility] ENDOSCOPY;  Service: Cardiovascular;  Laterality: N/A;    Current Medications: Outpatient Medications Prior to Visit  Medication Sig Dispense Refill  . aspirin EC 81 MG EC tablet Take 1 tablet (81 mg total) by mouth daily. 90 tablet 3  . carvedilol (COREG) 12.5 MG tablet Take 1 tablet (12.5 mg total) by mouth 2 (two) times daily with a meal. 60 tablet 0  . clopidogrel (PLAVIX) 75 MG tablet Take 1 tablet by mouth daily.    . enalapril (VASOTEC) 10 MG tablet Take 10 mg by mouth daily.    Marland Kitchen gabapentin (NEURONTIN) 300 MG capsule Take 1 capsule (300 mg total) by mouth 3 (three) times daily. 90 capsule 6  . nitroGLYCERIN (NITROSTAT) 0.4 MG SL tablet Place 1 tablet (0.4 mg total) under the tongue every 5 (five) minutes as needed for chest pain. 25 tablet 0  . omeprazole (PRILOSEC) 40 MG capsule TAKE 1 CAPSULE BY MOUTH ONCE DAILY FOR REFLUX    . oxyCODONE-acetaminophen (PERCOCET) 7.5-325 MG tablet Take 1 tablet by mouth every 4 (four) hours as needed for severe pain. 20 tablet 0  . prochlorperazine (COMPAZINE) 10 MG tablet Take 1 tablet (10 mg total) by mouth 2 (two) times daily as needed (Headache). 10 tablet 0  . rosuvastatin (CRESTOR) 20 MG tablet Take 1 tablet (20 mg total) by mouth daily at 6 PM. 30 tablet 0  . traMADol (ULTRAM) 50 MG tablet Take 1 tablet by mouth every 6 (six) hours as needed.    . hydrocortisone (ANUSOL-HC) 25 MG suppository Place 1 suppository (25 mg total) rectally at bedtime. 30 suppository 0  . oxyCODONE-acetaminophen (PERCOCET) 7.5-325 MG tablet Take 1 tablet by mouth 3 (three) times daily as needed. for pain     No facility-administered medications prior to visit.      Allergies:   Codeine, Oxycontin [oxycodone hcl], Contrast media [iodinated diagnostic agents], and Morphine and related   Social History   Socioeconomic History  . Marital status: Divorced    Spouse name: Not on file  . Number of  children: Not on file  . Years of education: Not on file  . Highest education level: Not on file  Occupational History  . Occupation: retired    Comment: Building control surveyor  Social Needs  . Financial resource strain: Not on file  . Food insecurity    Worry: Not on file    Inability: Not on file  . Transportation needs    Medical: Not on file    Non-medical: Not on file  Tobacco Use  . Smoking status: Former Smoker    Quit date: 05/23/1988    Years since quitting: 30.9  . Smokeless tobacco: Never Used  . Tobacco comment: smoked 2 packs per day for 20 years  Substance and Sexual Activity  . Alcohol use: No  . Drug use: No  . Sexual activity: Not on file  Lifestyle  .  Physical activity    Days per week: Not on file    Minutes per session: Not on file  . Stress: Not on file  Relationships  . Social Herbalist on phone: Not on file    Gets together: Not on file    Attends religious service: Not on file    Active member of club or organization: Not on file    Attends meetings of clubs or organizations: Not on file    Relationship status: Not on file  Other Topics Concern  . Not on file  Social History Narrative  . Not on file     Family History:  The patient's family history includes Coronary artery disease in his brother; Coronary artery disease (age of onset: 36) in his mother; Hypertension in his mother; Other (age of onset: 29) in his father; Other (age of onset: 39) in his sister; Skin cancer in his brother.   Review of Systems:   Please see the history of present illness.     General:  No chills, fever, night sweats or weight changes. Positive for bilateral shoulder pain (occurring for several months). Cardiovascular:  No chest pain, dyspnea on exertion, edema, orthopnea, palpitations, paroxysmal nocturnal dyspnea. Dermatological: No rash, lesions/masses Respiratory: No cough, dyspnea Urologic: No hematuria, dysuria Abdominal:   No nausea, vomiting, diarrhea, bright  red blood per rectum, melena, or hematemesis Neurologic:  No visual changes, wkns, changes in mental status. All other systems reviewed and are otherwise negative except as noted above.   Physical Exam:    VS:  BP 120/67   Pulse 64   Temp 98 F (36.7 C)   Ht 5\' 8"  (1.727 m)   Wt 203 lb (92.1 kg)   SpO2 97%   BMI 30.87 kg/m    General: Well developed, well nourished,male appearing in no acute distress. Head: Normocephalic, atraumatic, sclera non-icteric, no xanthomas, nares are without discharge.  Neck: No carotid bruits. JVD not elevated.  Lungs: Respirations regular and unlabored, without wheezes or rales.  Heart: Regular rate and rhythm. No S3 or S4.  No murmur, no rubs, or gallops appreciated. Abdomen: Soft, non-tender, non-distended with normoactive bowel sounds. No hepatomegaly. No rebound/guarding. No obvious abdominal masses. Msk:  Strength and tone appear normal for age. No joint deformities or effusions. Extremities: No clubbing or cyanosis. No lower extremity edema.  Distal pedal pulses are 2+ bilaterally. Radial cath site stable without ecchymosis or evidence of a hematoma.  Neuro: Alert and oriented X 3. Moves all extremities spontaneously. No focal deficits noted. Psych:  Responds to questions appropriately with a normal affect. Skin: No rashes or lesions noted  Wt Readings from Last 3 Encounters:  04/11/19 203 lb (92.1 kg)  03/27/19 206 lb 1.6 oz (93.5 kg)  03/20/19 212 lb (96.2 kg)     Studies/Labs Reviewed:   EKG:  EKG is ordered today.  The ekg ordered today demonstrates NSR, HR 65 with anterior infarct pattern and diffuse TWI along the lateral leads which has improved when compared to his prior tracing.   Recent Labs: 06/22/2018: TSH 0.634 06/26/2018: Magnesium 2.3 01/30/2019: B Natriuretic Peptide 105.0 02/11/2019: ALT 30 03/27/2019: BUN 23; Creatinine, Ser 1.30; Hemoglobin 12.3; Platelets 184; Potassium 4.3; Sodium 138   Lipid Panel    Component Value  Date/Time   CHOL 87 03/26/2019 0522   CHOL 125 02/11/2019 0847   TRIG 95 03/26/2019 0522   HDL 32 (L) 03/26/2019 0522   HDL 45 02/11/2019 0847  CHOLHDL 2.7 03/26/2019 0522   VLDL 19 03/26/2019 0522   LDLCALC 36 03/26/2019 0522   LDLCALC 64 02/11/2019 0847    Additional studies/ records that were reviewed today include:   Cardiac Catheterization: 03/26/2019  Prox RCA lesion is 50% stenosed.  Dist RCA lesion is 60% stenosed.  RPDA lesion is 50% stenosed.  Prox Cx to Mid Cx lesion is 90% stenosed.  Prox LAD lesion is 99% stenosed.  Scoring balloon angioplasty was performed using a BALLOON WOLVERINE 2.50X10.  Post intervention, there is a 20% residual stenosis.  A drug-eluting stent was successfully placed using a STENT SYNERGY DES 2.75X20.  Post intervention, there is a 0% residual stenosis.  Dist Cx lesion is 70% stenosed.  2nd Mrg lesion is 60% stenosed.  2nd Diag lesion is 80% stenosed.  The left ventricular systolic function is normal.  LV end diastolic pressure is normal.  The left ventricular ejection fraction is 50-55% by visual estimate.  There is no mitral valve regurgitation.   1. Severe stenosis proximal LAD. Successful PTCA/DES x 1 proximal LAD 2. Severe restenosis in the mid Circumflex stented segment. Successful scoring balloon angioplasty within the old stented segment. No stent placed.  3. Moderate stenosis in the distal Circumflex branches.  4. Moderate restenosis within the old mid RCA stent. This does not appear to be flow limiting. Moderate distal RCA and PDA stenosis. 5. Low normal LV systolic function, LVEF around 50%.   Recommendations: DAPT with ASA and Plavix for at least one year. Continue beta blocker and statin.    Echocardiogram: 05/2018 IMPRESSIONS    1. The left ventricle has moderately reduced systolic function of 123456. The cavity size is normal. There is severe left ventricular wall thickness. Echo evidence of pseudonormal  diastolic filling patterns.  2. Normal left atrial size.  3. Normal right atrial size.  4. The mitral valve normal in structure. Regurgitation is mild by color flow Doppler.  5. Normal tricuspid valve.  6. Tricuspid regurgitation is mild.  7. The aortic valve tricuspid. There is mild thickening of the aortic valve.  8. No atrial level shunt detected by color flow Doppler.  Assessment:    1. Non-ST elevation myocardial infarction (NSTEMI), subsequent episode of care ([place])   2. Coronary artery disease involving native coronary artery of native heart without angina pectoris   3. Essential hypertension   4. Hyperlipidemia LDL goal <70   5. History of stroke      Plan:   In order of problems listed above:  1. Subsequent Episode of Care for NSTEMI/CAD - he is s/p prior stenting of LAD and RCA and had recently undergone a stress test in 02/2019 which showed a fixed defect but no significant ischemia and was a low-risk study. Presented to the ED two weeks later with an NSTEMI and cath showed 50% prox-RCA stenosis, 60% dRCA, 50% RPDA, 90% prox LCx, 99% Prox-LAD, 70% Distal LCx, 60% 2nd Mrg, 80% 2nd Diagonal and preserved EF of 50-55%. Was treated with PTCA/DESx1 to the LAD and successful scoring balloon angioplasty in the previously stented segment of LCx.  - He denies any recurrent chest pain since hospital discharge and says his dyspnea and fatigue have significantly improved. He is aware that his shoulder surgery is delayed for at least 6 months and preferably up to 12 months given the need for DAPT.   - Continue ASA, Plavix, beta-blocker and statin therapy.    2. HTN - BP is well controlled at 120/67 during today's  visit. Continue Coreg 12.5 mg twice daily and Enalapril 10 mg daily. Will request that a BMET be obtained by his PCP next week with his routine labs to reassess creatinine following recent catheterization.  3. HLD - FLP during recent admission showed total cholesterol 87,  triglycerides 95, HDL 32 and LDL 36. At goal of LDL less than 70. Continue Crestor 20 mg daily.  4. History of CVA - He was actually on ASA and Plavix prior to his recent admission. Continue DAPT along with statin therapy.    Medication Adjustments/Labs and Tests Ordered: Current medicines are reviewed at length with the patient today.  Concerns regarding medicines are outlined above.  Medication changes, Labs and Tests ordered today are listed in the Patient Instructions below. Patient Instructions  Medication Instructions:  Your physician recommends that you continue on your current medications as directed. Please refer to the Current Medication list given to you today.  *If you need a refill on your cardiac medications before your next appointment, please call your pharmacy*  Lab Work: None today If you have labs (blood work) drawn today and your tests are completely normal, you will receive your results only by: Marland Kitchen MyChart Message (if you have MyChart) OR . A paper copy in the mail If you have any lab test that is abnormal or we need to change your treatment, we will call you to review the results.  Testing/Procedures: None today  Follow-Up: At [facility], you and your health needs are our priority.  As part of our continuing mission to provide you with exceptional heart care, we have created designated Provider Care Teams.  These Care Teams include your primary Cardiologist (physician) and Advanced Practice Providers (APPs -  Physician Assistants and Nurse Practitioners) who all work together to provide you with the care you need, when you need it.  Your next appointment:   3 month(s)  The format for your next appointment:   In Person  Provider:   Jenkins Rouge, MD  Other Instructions None      Thank you for choosing [facility] !            Signed, Erma Heritage, PA-C  04/11/2019 6:04 PM    [place] [address] [place], [place] [address] Phone: [phone] Fax: [phone]

## 2019-04-11 NOTE — Patient Instructions (Signed)
Medication Instructions:  Your physician recommends that you continue on your current medications as directed. Please refer to the Current Medication list given to you today.  *If you need a refill on your cardiac medications before your next appointment, please call your pharmacy*  Lab Work: None today If you have labs (blood work) drawn today and your tests are completely normal, you will receive your results only by: Marland Kitchen MyChart Message (if you have MyChart) OR . A paper copy in the mail If you have any lab test that is abnormal or we need to change your treatment, we will call you to review the results.  Testing/Procedures: None today  Follow-Up: At [facility], you and your health needs are our priority.  As part of our continuing mission to provide you with exceptional heart care, we have created designated Provider Care Teams.  These Care Teams include your primary Cardiologist (physician) and Advanced Practice Providers (APPs -  Physician Assistants and Nurse Practitioners) who all work together to provide you with the care you need, when you need it.  Your next appointment:   3 month(s)  The format for your next appointment:   In Person  Provider:   Jenkins Rouge, MD  Other Instructions None      Thank you for choosing [facility] !

## 2019-04-16 DIAGNOSIS — E782 Mixed hyperlipidemia: Secondary | ICD-10-CM | POA: Diagnosis not present

## 2019-04-16 DIAGNOSIS — I10 Essential (primary) hypertension: Secondary | ICD-10-CM | POA: Diagnosis not present

## 2019-04-16 DIAGNOSIS — E1122 Type 2 diabetes mellitus with diabetic chronic kidney disease: Secondary | ICD-10-CM | POA: Diagnosis not present

## 2019-04-16 DIAGNOSIS — I2510 Atherosclerotic heart disease of native coronary artery without angina pectoris: Secondary | ICD-10-CM | POA: Diagnosis not present

## 2019-04-24 DIAGNOSIS — E782 Mixed hyperlipidemia: Secondary | ICD-10-CM | POA: Diagnosis not present

## 2019-04-24 DIAGNOSIS — E1122 Type 2 diabetes mellitus with diabetic chronic kidney disease: Secondary | ICD-10-CM | POA: Diagnosis not present

## 2019-04-24 DIAGNOSIS — I10 Essential (primary) hypertension: Secondary | ICD-10-CM | POA: Diagnosis not present

## 2019-05-01 DIAGNOSIS — E782 Mixed hyperlipidemia: Secondary | ICD-10-CM | POA: Diagnosis not present

## 2019-05-01 DIAGNOSIS — I2510 Atherosclerotic heart disease of native coronary artery without angina pectoris: Secondary | ICD-10-CM | POA: Diagnosis not present

## 2019-05-01 DIAGNOSIS — N1831 Chronic kidney disease, stage 3a: Secondary | ICD-10-CM | POA: Diagnosis not present

## 2019-05-01 DIAGNOSIS — Z8673 Personal history of transient ischemic attack (TIA), and cerebral infarction without residual deficits: Secondary | ICD-10-CM | POA: Diagnosis not present

## 2019-05-01 DIAGNOSIS — M19011 Primary osteoarthritis, right shoulder: Secondary | ICD-10-CM | POA: Diagnosis not present

## 2019-05-01 DIAGNOSIS — I129 Hypertensive chronic kidney disease with stage 1 through stage 4 chronic kidney disease, or unspecified chronic kidney disease: Secondary | ICD-10-CM | POA: Diagnosis not present

## 2019-06-04 DIAGNOSIS — I129 Hypertensive chronic kidney disease with stage 1 through stage 4 chronic kidney disease, or unspecified chronic kidney disease: Secondary | ICD-10-CM | POA: Diagnosis not present

## 2019-06-04 DIAGNOSIS — E782 Mixed hyperlipidemia: Secondary | ICD-10-CM | POA: Diagnosis not present

## 2019-06-04 DIAGNOSIS — I10 Essential (primary) hypertension: Secondary | ICD-10-CM | POA: Diagnosis not present

## 2019-06-04 DIAGNOSIS — N183 Chronic kidney disease, stage 3 unspecified: Secondary | ICD-10-CM | POA: Diagnosis not present

## 2019-06-04 DIAGNOSIS — E1122 Type 2 diabetes mellitus with diabetic chronic kidney disease: Secondary | ICD-10-CM | POA: Diagnosis not present

## 2019-06-04 DIAGNOSIS — I2510 Atherosclerotic heart disease of native coronary artery without angina pectoris: Secondary | ICD-10-CM | POA: Diagnosis not present

## 2019-06-05 DIAGNOSIS — M19011 Primary osteoarthritis, right shoulder: Secondary | ICD-10-CM | POA: Diagnosis not present

## 2019-06-21 DIAGNOSIS — L89152 Pressure ulcer of sacral region, stage 2: Secondary | ICD-10-CM | POA: Diagnosis not present

## 2019-06-26 ENCOUNTER — Encounter (HOSPITAL_COMMUNITY): Payer: Self-pay | Admitting: Emergency Medicine

## 2019-06-26 ENCOUNTER — Other Ambulatory Visit: Payer: Self-pay

## 2019-06-26 ENCOUNTER — Emergency Department (HOSPITAL_COMMUNITY): Payer: PPO

## 2019-06-26 ENCOUNTER — Emergency Department (HOSPITAL_COMMUNITY)
Admission: EM | Admit: 2019-06-26 | Discharge: 2019-06-26 | Disposition: A | Discharge status: Home / Self Care | Payer: PPO | Attending: Emergency Medicine | Admitting: Emergency Medicine

## 2019-06-26 DIAGNOSIS — Z8673 Personal history of transient ischemic attack (TIA), and cerebral infarction without residual deficits: Secondary | ICD-10-CM | POA: Insufficient documentation

## 2019-06-26 DIAGNOSIS — I129 Hypertensive chronic kidney disease with stage 1 through stage 4 chronic kidney disease, or unspecified chronic kidney disease: Secondary | ICD-10-CM | POA: Diagnosis not present

## 2019-06-26 DIAGNOSIS — Z7901 Long term (current) use of anticoagulants: Secondary | ICD-10-CM | POA: Insufficient documentation

## 2019-06-26 DIAGNOSIS — N183 Chronic kidney disease, stage 3 unspecified: Secondary | ICD-10-CM | POA: Diagnosis not present

## 2019-06-26 DIAGNOSIS — R42 Dizziness and giddiness: Secondary | ICD-10-CM | POA: Insufficient documentation

## 2019-06-26 DIAGNOSIS — Z79899 Other long term (current) drug therapy: Secondary | ICD-10-CM | POA: Insufficient documentation

## 2019-06-26 DIAGNOSIS — Z87891 Personal history of nicotine dependence: Secondary | ICD-10-CM | POA: Diagnosis not present

## 2019-06-26 DIAGNOSIS — I2510 Atherosclerotic heart disease of native coronary artery without angina pectoris: Secondary | ICD-10-CM | POA: Insufficient documentation

## 2019-06-26 DIAGNOSIS — Z7982 Long term (current) use of aspirin: Secondary | ICD-10-CM | POA: Insufficient documentation

## 2019-06-26 LAB — CBC WITH DIFFERENTIAL/PLATELET
Abs Immature Granulocytes: 0.01 10*3/uL (ref 0.00–0.07)
Basophils Absolute: 0 10*3/uL (ref 0.0–0.1)
Basophils Relative: 0 %
Eosinophils Absolute: 0.2 10*3/uL (ref 0.0–0.5)
Eosinophils Relative: 4 %
HCT: 37.2 % — ABNORMAL LOW (ref 39.0–52.0)
Hemoglobin: 12.1 g/dL — ABNORMAL LOW (ref 13.0–17.0)
Immature Granulocytes: 0 %
Lymphocytes Relative: 14 %
Lymphs Abs: 0.8 10*3/uL (ref 0.7–4.0)
MCH: 29.6 pg (ref 26.0–34.0)
MCHC: 32.5 g/dL (ref 30.0–36.0)
MCV: 91 fL (ref 80.0–100.0)
Monocytes Absolute: 0.5 10*3/uL (ref 0.1–1.0)
Monocytes Relative: 10 %
Neutro Abs: 4.1 10*3/uL (ref 1.7–7.7)
Neutrophils Relative %: 72 %
Platelets: 173 10*3/uL (ref 150–400)
RBC: 4.09 MIL/uL — ABNORMAL LOW (ref 4.22–5.81)
RDW: 13.2 % (ref 11.5–15.5)
WBC: 5.6 10*3/uL (ref 4.0–10.5)
nRBC: 0 % (ref 0.0–0.2)

## 2019-06-26 LAB — COMPREHENSIVE METABOLIC PANEL
ALT: 16 U/L (ref 0–44)
AST: 13 U/L — ABNORMAL LOW (ref 15–41)
Albumin: 3.5 g/dL (ref 3.5–5.0)
Alkaline Phosphatase: 47 U/L (ref 38–126)
Anion gap: 8 (ref 5–15)
BUN: 17 mg/dL (ref 8–23)
CO2: 24 mmol/L (ref 22–32)
Calcium: 8.9 mg/dL (ref 8.9–10.3)
Chloride: 107 mmol/L (ref 98–111)
Creatinine, Ser: 1.08 mg/dL (ref 0.61–1.24)
GFR calc Af Amer: >60 mL/min (ref >60)
GFR calc non Af Amer: >60 mL/min (ref >60)
Glucose, Bld: 100 mg/dL — ABNORMAL HIGH (ref 70–99)
Potassium: 3.5 mmol/L (ref 3.5–5.1)
Sodium: 139 mmol/L (ref 135–145)
Total Bilirubin: 0.6 mg/dL (ref 0.3–1.2)
Total Protein: 6 g/dL — ABNORMAL LOW (ref 6.5–8.1)

## 2019-06-26 LAB — CBG MONITORING, ED: Glucose-Capillary: 87 mg/dL (ref 70–99)

## 2019-06-26 MED ORDER — SODIUM CHLORIDE 0.9 % IV BOLUS
500.0000 mL | Freq: Once | INTRAVENOUS | Status: AC
  Administered 2019-06-26: 500 mL via INTRAVENOUS

## 2019-06-26 NOTE — ED Provider Notes (Signed)
[facility] EMERGENCY DEPARTMENT Provider Note   CSN: [id number] Arrival date & time: 06/26/19  1027     History Chief Complaint  Patient presents with  . Dizziness    Jeremy [name] is a 74 y.o. male.  Patient states that he woke up dizzy this morning.  He believes he forgot to take his blood pressure medicine yesterday.  And his blood pressure was elevated.  He took his blood pressure medicine this morning and by the time he came to the emergency department is feeling better.  He was not having any vertigo symptoms just lightheaded  The history is provided by the patient. No language interpreter was used.  Dizziness Quality:  Lightheadedness Severity:  Moderate Onset quality:  Sudden Timing:  Rare Progression:  Resolved Chronicity:  New Context: not when bending over   Relieved by:  Nothing Worsened by:  Nothing Ineffective treatments:  None tried Associated symptoms: no chest pain, no diarrhea and no headaches        Past Medical History:  Diagnosis Date  . Arthritis   . CAD (coronary artery disease)    a. s/p prior stenting of LAD and RCA b. 03/2019: NSTEMI and required PTCA/DESx1 to the LAD and successful scoring balloon angioplasty in the previously stented segment of LCx  . Carotid stenosis, asymptomatic, right   . Carpal tunnel syndrome    bilateral  . Coronary artery disease   . GERD (gastroesophageal reflux disease)   . History of kidney stones   . Hx of colonic polyp   . Hypercholesterolemia   . Hypertension   . Myocardial infarction ([place])    hx of  . Obesity   . Stroke [facility])     Patient Active Problem List   Diagnosis Date Noted  . ACS (acute coronary syndrome) ([place]) 03/25/2019  . Unstable angina ([place]) 03/25/2019  . NSTEMI (non-ST elevated myocardial infarction) ([place]) 03/25/2019  . Cerebral thrombosis with cerebral infarction 06/28/2018  . Hypernatremia 06/27/2018  . CKD (chronic kidney disease), stage III 06/27/2018  . Acute respiratory  failure with hypoxia ([place])   . Seizure ([place]) 06/20/2018  . Acute encephalopathy 06/20/2018  . Asymptomatic carotid artery stenosis without infarction, right 12/25/2017  . Pre-operative cardiovascular examination 11/03/2017  . Carotid artery disease ([place]) 11/03/2017  . Shoulder joint pain 09/17/2013  . Numbness around mouth 12/01/2011  . Erectile dysfunction 09/05/2010  . TRIGGER FINGER, LEFT MIDDLE 04/20/2010  . ANAL FISSURE 04/03/2009  . Mixed dyslipidemia 04/01/2009  . RECTAL BLEEDING 04/01/2009  . ABDOMINAL PAIN-LLQ 04/01/2009  . ABDOMINAL PAIN, GENERALIZED 03/30/2009  . OTITIS EXTERNA, LEFT 05/07/2008  . BRONCHITIS, ACUTE 04/21/2008  . CARPAL TUNNEL SYNDROME, BILATERAL 12/25/2007  . COLD SORE 03/12/2007  . CAD (coronary artery disease) DES pLAD 03/25/19 01/01/2007  . OBESITY NOS 06/19/2006  . Essential hypertension 06/19/2006  . MYOCARDIAL INFARCTION, HX OF 06/19/2006  . COLONIC POLYPS, HX OF 06/19/2006    Past Surgical History:  Procedure Laterality Date  . CARDIAC CATHETERIZATION    . CORONARY BALLOON ANGIOPLASTY N/A 03/26/2019   Procedure: CORONARY BALLOON ANGIOPLASTY;  Surgeon: Burnell Blanks, MD;  Location: [place] CV LAB;  Service: Cardiovascular;  Laterality: N/A;  . CORONARY STENT INTERVENTION N/A 03/26/2019   Procedure: CORONARY STENT INTERVENTION;  Surgeon: Burnell Blanks, MD;  Location: [place] CV LAB;  Service: Cardiovascular;  Laterality: N/A;  . CORONARY STENT PLACEMENT  2000   x3   . CYST EXCISION     x 2  . ENDARTERECTOMY  Right 12/25/2017   Procedure: ENDARTERECTOMY CAROTID RIGHT;  Surgeon: Rosetta Posner, MD;  Location: [place];  Service: Vascular;  Laterality: Right;  . LEFT HEART CATH AND CORONARY ANGIOGRAPHY N/A 03/26/2019   Procedure: LEFT HEART CATH AND CORONARY ANGIOGRAPHY;  Surgeon: Burnell Blanks, MD;  Location: [place] CV LAB;  Service: Cardiovascular;  Laterality: N/A;  . PATCH ANGIOPLASTY Right 12/25/2017    Procedure: PATCH ANGIOPLASTY RIGHT CAROTID ARTERY;  Surgeon: Rosetta Posner, MD;  Location: [place];  Service: Vascular;  Laterality: Right;  . TEE WITHOUT CARDIOVERSION N/A 06/29/2018   Procedure: TRANSESOPHAGEAL ECHOCARDIOGRAM (TEE);  Surgeon: Fay Records, MD;  Location: [facility] ENDOSCOPY;  Service: Cardiovascular;  Laterality: N/A;       Family History  Problem Relation Age of Onset  . Other Father 19       deceased old age  . Coronary artery disease Mother 28       deceased  . Hypertension Mother   . Other Sister 32       living and healthy  . Coronary artery disease Brother   . Skin cancer Brother   . Colon cancer Neg Hx   . Pancreatic cancer Neg Hx   . Stomach cancer Neg Hx   . Esophageal cancer Neg Hx   . Liver disease Neg Hx     Social History   Tobacco Use  . Smoking status: Former Smoker    Quit date: 05/23/1988    Years since quitting: 31.1  . Smokeless tobacco: Never Used  . Tobacco comment: smoked 2 packs per day for 20 years  Substance Use Topics  . Alcohol use: No  . Drug use: No    Home Medications Prior to Admission medications   Medication Sig Start Date End Date Taking? Authorizing Provider  aspirin EC 81 MG EC tablet Take 1 tablet (81 mg total) by mouth daily. 03/28/19  Yes Furth, Cadence H, PA-C  carvedilol (COREG) 12.5 MG tablet Take 1 tablet (12.5 mg total) by mouth 2 (two) times daily with a meal. 06/30/18  Yes Mariel Aloe, MD  clopidogrel (PLAVIX) 75 MG tablet Take 1 tablet by mouth daily. 01/14/19  Yes [provider]  enalapril (VASOTEC) 10 MG tablet Take 10 mg by mouth daily. 12/11/18  Yes [provider]  gabapentin (NEURONTIN) 300 MG capsule Take 1 capsule (300 mg total) by mouth 3 (three) times daily. 08/21/18  Yes Garvin Fila, MD  nitroGLYCERIN (NITROSTAT) 0.4 MG SL tablet Place 1 tablet (0.4 mg total) under the tongue every 5 (five) minutes as needed for chest pain. 03/27/19  Yes Furth, Cadence H, PA-C  omeprazole (PRILOSEC) 40  MG capsule TAKE 1 CAPSULE BY MOUTH ONCE DAILY FOR REFLUX 02/01/19  Yes [provider]  oxyCODONE-acetaminophen (PERCOCET) 7.5-325 MG tablet Take 1 tablet by mouth every 4 (four) hours as needed for severe pain. 03/27/19 03/26/20 Yes Croitoru, Mihai, MD  rosuvastatin (CRESTOR) 20 MG tablet Take 1 tablet (20 mg total) by mouth daily at 6 PM. 06/30/18  Yes Mariel Aloe, MD  traMADol (ULTRAM) 50 MG tablet Take 1 tablet by mouth every 6 (six) hours as needed. 01/15/19  Yes [provider]  prochlorperazine (COMPAZINE) 10 MG tablet Take 1 tablet (10 mg total) by mouth 2 (two) times daily as needed (Headache). Patient not taking: Reported on 06/26/2019 10/27/17   Charlann Lange, PA-C    Allergies    Codeine, Oxycontin [oxycodone hcl], Contrast media [iodinated diagnostic agents], and  Morphine and related  Review of Systems   Review of Systems  Constitutional: Negative for appetite change and fatigue.  HENT: Negative for congestion, ear discharge and sinus pressure.   Eyes: Negative for discharge.  Respiratory: Negative for cough.   Cardiovascular: Negative for chest pain.  Gastrointestinal: Negative for abdominal pain and diarrhea.  Genitourinary: Negative for frequency and hematuria.  Musculoskeletal: Negative for back pain.  Skin: Negative for rash.  Neurological: Positive for dizziness. Negative for seizures and headaches.  Psychiatric/Behavioral: Negative for hallucinations.    Physical Exam Updated Vital Signs BP (!) 134/57   Pulse 62   Temp 98.4 F (36.9 C) (Oral)   Resp 14   Ht 5\' 8"  (1.727 m)   Wt 87.5 kg   SpO2 96%   BMI 29.35 kg/m   Physical Exam Vitals and nursing note reviewed.  Constitutional:      Appearance: He is well-developed.  HENT:     Head: Normocephalic.     Nose: Nose normal.  Eyes:     General: No scleral icterus.    Conjunctiva/sclera: Conjunctivae normal.  Neck:     Thyroid: No thyromegaly.  Cardiovascular:     Rate and Rhythm: Normal  rate and regular rhythm.     Heart sounds: No murmur. No friction rub. No gallop.   Pulmonary:     Breath sounds: No stridor. No wheezing or rales.  Chest:     Chest wall: No tenderness.  Abdominal:     General: There is no distension.     Tenderness: There is no abdominal tenderness. There is no rebound.  Musculoskeletal:        General: Normal range of motion.     Cervical back: Neck supple.  Lymphadenopathy:     Cervical: No cervical adenopathy.  Skin:    Findings: No erythema or rash.  Neurological:     Mental Status: He is oriented to person, place, and time.     Motor: No abnormal muscle tone.     Coordination: Coordination normal.  Psychiatric:        Behavior: Behavior normal.     ED Results / Procedures / Treatments   Labs (all labs ordered are listed, but only abnormal results are displayed) Labs Reviewed  CBC WITH DIFFERENTIAL/PLATELET - Abnormal; Notable for the following components:      Result Value   RBC 4.09 (*)    Hemoglobin 12.1 (*)    HCT 37.2 (*)    All other components within normal limits  COMPREHENSIVE METABOLIC PANEL - Abnormal; Notable for the following components:   Glucose, Bld 100 (*)    Total Protein 6.0 (*)    AST 13 (*)    All other components within normal limits  CBG MONITORING, ED    EKG None  Radiology DG Chest Portable 1 View  Result Date: 06/26/2019 CLINICAL DATA:  Dizziness since 0300 hours, weakness EXAM: PORTABLE CHEST 1 VIEW COMPARISON:  Portable exam 1102 hours compared to 03/25/2019 FINDINGS: Normal heart size, mediastinal contours, and pulmonary vascularity. Atherosclerotic calcification aorta. Lungs clear. No infiltrate, pleural effusion, or pneumothorax. Multilevel endplate spur formation thoracic spine. Advanced degenerative changes of BILATERAL glenohumeral joints. IMPRESSION: No acute abnormalities. Aortic Atherosclerosis (ICD10-I70.0). Electronically Signed   By: Lavonia Dana M.D.   On: 06/26/2019 11:08     Procedures Procedures (including critical care time)  Medications Ordered in ED Medications  sodium chloride 0.9 % bolus 500 mL (500 mLs Intravenous New Bag/Given 06/26/19 1135)  ED Course  I have reviewed the triage vital signs and the nursing notes.  Pertinent labs & imaging results that were available during my care of the patient were reviewed by me and considered in my medical decision making (see chart for details).    MDM Rules/Calculators/A&P                      Labs unremarkable.  Patient symptoms improved since taking his blood pressure medicine.  He will be discharged home will follow up with PCP as needed Final Clinical Impression(s) / ED Diagnoses Final diagnoses:  Dizziness    Rx / DC Orders ED Discharge Orders    None       Milton Ferguson, MD 06/26/19 1229

## 2019-06-26 NOTE — ED Triage Notes (Signed)
Pt reports dizziness since waking up at 3am. Pt reports felt fine when went to bed at 1030. Pt denies any vision changes, other neuro symptoms. Pt alert and oriented. Speech clear. Pt able to walk with steady gait. No facial asymmetry. Pt reports elevated bp this am.   Pt also reports was being treated for "pus pocket" on sacrum. Pt reports was repscribed medication for this and reports diarrhea ever since. Pt reports stopped taking medication x2 days. Pt states diarrhea persists.

## 2019-06-26 NOTE — Discharge Instructions (Addendum)
Make sure you take your blood pressure medicine.  And follow-up with your family doctor if any problem

## 2019-06-26 NOTE — ED Notes (Signed)
Attempted to get an iv twice unsuccessful.

## 2019-07-01 DIAGNOSIS — R197 Diarrhea, unspecified: Secondary | ICD-10-CM | POA: Diagnosis not present

## 2019-07-01 DIAGNOSIS — E860 Dehydration: Secondary | ICD-10-CM | POA: Diagnosis not present

## 2019-08-13 NOTE — Progress Notes (Signed)
Cardiology Office Note    Date:  08/19/2019   ID:  Jeremy [name], DOB 09-11-1945, MRN [id number]  PCP:  Celene Squibb, MD  Cardiologist: Jenkins Rouge, MD  --> now wishes to follow-up in [facility] (evaluated by Dr. Johnsie Cancel during admission)  No chief complaint on file.   History of Present Illness:    Jeremy [name] is a 74 y.o. male with past medical history of CAD (s/p prior stenting of LAD and RCA), HTN,HLD, and prior CVA who presents for f/u CAD. Previously followed by Dr Julianne [facility] in [place] and preferes to f/u here   He most recently presented to [facility]  ED on 03/25/2019 for evaluation of worsening chest pain and throat discomfort which had started the day of admission. EKG showed an incomplete left bundle branch block but he was found to have an NSTEMI with peak troponin of 5345. He was transferred to [facility] for a cardiac catheterization. This was performed by Dr. Angelena Form on 03/26/2019 and showed 50% prox-RCA stenosis, 60% dRCA, 50% RPDA, 90% prox LCx, 99% Prox-LAD, 70% Distal LCx, 60% 2nd Mrg, 80% 2nd Diagonal and preserved EF of 50-55%. Was treated with PTCA/DESx1 to the LAD and successful scoring balloon angioplasty in the previously stented segment of LCx. He was continued on ASA and Plavix with recommendations to continue this for at least 1 year along with beta-blocker and statin therapy.  Since d/c has done well with no recurrent angina. EF has been normal Compliant with meds Discussed need for life long DAT given previous CVA and multiple interventions/stents including restenosis   Seen in ER  06/26/19 with some dizziness and elevated BP but forgot to take his BP meds and felt better after taking DC home Without change in Rx   He is frustrated with issues shoulders. He saw Dr Lorre Nick and was referred to a [facility] doctor and he didn't like this Wanted to stay in [facility] Cardiologist at [facility] d/c his plavix   He cannot lift either shoulder above his  head Has trouble feeding himself  No angina   Past Medical History:  Diagnosis Date  . Arthritis   . CAD (coronary artery disease)    a. s/p prior stenting of LAD and RCA b. 03/2019: NSTEMI and required PTCA/DESx1 to the LAD and successful scoring balloon angioplasty in the previously stented segment of LCx  . Carotid stenosis, asymptomatic, right   . Carpal tunnel syndrome    bilateral  . Coronary artery disease   . GERD (gastroesophageal reflux disease)   . History of kidney stones   . Hx of colonic polyp   . Hypercholesterolemia   . Hypertension   . Myocardial infarction ([place])    hx of  . Obesity   . Stroke [facility])     Past Surgical History:  Procedure Laterality Date  . CARDIAC CATHETERIZATION    . CORONARY BALLOON ANGIOPLASTY N/A 03/26/2019   Procedure: CORONARY BALLOON ANGIOPLASTY;  Surgeon: Burnell Blanks, MD;  Location: [place] CV LAB;  Service: Cardiovascular;  Laterality: N/A;  . CORONARY STENT INTERVENTION N/A 03/26/2019   Procedure: CORONARY STENT INTERVENTION;  Surgeon: Burnell Blanks, MD;  Location: [place] CV LAB;  Service: Cardiovascular;  Laterality: N/A;  . CORONARY STENT PLACEMENT  2000   x3   . CYST EXCISION     x 2  . ENDARTERECTOMY Right 12/25/2017   Procedure: ENDARTERECTOMY CAROTID RIGHT;  Surgeon: Rosetta Posner, MD;  Location: [place];  Service: Vascular;  Laterality: Right;  . LEFT HEART CATH AND CORONARY ANGIOGRAPHY N/A 03/26/2019   Procedure: LEFT HEART CATH AND CORONARY ANGIOGRAPHY;  Surgeon: Burnell Blanks, MD;  Location: [place] CV LAB;  Service: Cardiovascular;  Laterality: N/A;  . PATCH ANGIOPLASTY Right 12/25/2017   Procedure: PATCH ANGIOPLASTY RIGHT CAROTID ARTERY;  Surgeon: Rosetta Posner, MD;  Location: [place];  Service: Vascular;  Laterality: Right;  . TEE WITHOUT CARDIOVERSION N/A 06/29/2018   Procedure: TRANSESOPHAGEAL ECHOCARDIOGRAM (TEE);  Surgeon: Fay Records, MD;  Location: [facility] ENDOSCOPY;  Service:  Cardiovascular;  Laterality: N/A;    Current Medications: Outpatient Medications Prior to Visit  Medication Sig Dispense Refill  . aspirin EC 81 MG EC tablet Take 1 tablet (81 mg total) by mouth daily. 90 tablet 3  . carvedilol (COREG) 12.5 MG tablet Take 1 tablet (12.5 mg total) by mouth 2 (two) times daily with a meal. 60 tablet 0  . enalapril (VASOTEC) 10 MG tablet Take 10 mg by mouth daily.    Marland Kitchen gabapentin (NEURONTIN) 300 MG capsule Take 1 capsule (300 mg total) by mouth 3 (three) times daily. 90 capsule 6  . nitroGLYCERIN (NITROSTAT) 0.4 MG SL tablet Place 1 tablet (0.4 mg total) under the tongue every 5 (five) minutes as needed for chest pain. 25 tablet 0  . omeprazole (PRILOSEC) 40 MG capsule TAKE 1 CAPSULE BY MOUTH ONCE DAILY FOR REFLUX    . oxyCODONE-acetaminophen (PERCOCET) 7.5-325 MG tablet Take 1 tablet by mouth every 4 (four) hours as needed for severe pain. 20 tablet 0  . prochlorperazine (COMPAZINE) 10 MG tablet Take 1 tablet (10 mg total) by mouth 2 (two) times daily as needed (Headache). 10 tablet 0  . rosuvastatin (CRESTOR) 20 MG tablet Take 1 tablet (20 mg total) by mouth daily at 6 PM. 30 tablet 0  . traMADol (ULTRAM) 50 MG tablet Take 1 tablet by mouth every 6 (six) hours as needed.    . clopidogrel (PLAVIX) 75 MG tablet Take 1 tablet by mouth daily.     No facility-administered medications prior to visit.     Allergies:   Codeine, Oxycontin [oxycodone hcl], Contrast media [iodinated diagnostic agents], and Morphine and related   Social History   Socioeconomic History  . Marital status: Divorced    Spouse name: Not on file  . Number of children: Not on file  . Years of education: Not on file  . Highest education level: Not on file  Occupational History  . Occupation: retired    Comment: welder  Tobacco Use  . Smoking status: Former Smoker    Quit date: 05/23/1988    Years since quitting: 31.2  . Smokeless tobacco: Never Used  . Tobacco comment: smoked 2 packs  per day for 20 years  Substance and Sexual Activity  . Alcohol use: No  . Drug use: No  . Sexual activity: Not on file  Other Topics Concern  . Not on file  Social History Narrative  . Not on file   Social Determinants of Health   Financial Resource Strain:   . Difficulty of Paying Living Expenses:   Food Insecurity:   . Worried About Charity fundraiser in the Last Year:   . Arboriculturist in the Last Year:   Transportation Needs:   . Film/video editor (Medical):   Marland Kitchen Lack of Transportation (Non-Medical):   Physical Activity:   . Days of Exercise per Week:   . Minutes of  Exercise per Session:   Stress:   . Feeling of Stress :   Social Connections:   . Frequency of Communication with Friends and Family:   . Frequency of Social Gatherings with Friends and Family:   . Attends Religious Services:   . Active Member of Clubs or Organizations:   . Attends Archivist Meetings:   Marland Kitchen Marital Status:      Family History:  The patient's family history includes Coronary artery disease in his brother; Coronary artery disease (age of onset: 32) in his mother; Hypertension in his mother; Other (age of onset: 54) in his father; Other (age of onset: 85) in his sister; Skin cancer in his brother.   Review of Systems:   Please see the history of present illness.     General:  No chills, fever, night sweats or weight changes. Positive for bilateral shoulder pain (occurring for several months). Cardiovascular:  No chest pain, dyspnea on exertion, edema, orthopnea, palpitations, paroxysmal nocturnal dyspnea. Dermatological: No rash, lesions/masses Respiratory: No cough, dyspnea Urologic: No hematuria, dysuria Abdominal:   No nausea, vomiting, diarrhea, bright red blood per rectum, melena, or hematemesis Neurologic:  No visual changes, wkns, changes in mental status. All other systems reviewed and are otherwise negative except as noted above.   Physical Exam:    VS:  BP  126/76 (BP Location: Left Arm)   Pulse 63   Temp (!) 96 F (35.6 C)   Ht 5\' 8"  (1.727 m)   Wt 200 lb (90.7 kg)   SpO2 95%   BMI 30.41 kg/m     Affect appropriate Healthy:  appears stated age 17: normal Neck supple with no adenopathy JVP normal no bruits no thyromegaly Lungs clear with no wheezing and good diaphragmatic motion Heart:  S1/S2 no murmur, no rub, gallop or click PMI normal Abdomen: benighn, BS positve, no tenderness, no AAA no bruit.  No HSM or HJR Distal pulses intact with no bruits No edema Neuro non-focal Skin warm and dry No muscular weakness   Wt Readings from Last 3 Encounters:  08/19/19 200 lb (90.7 kg)  06/26/19 193 lb (87.5 kg)  04/11/19 203 lb (92.1 kg)     Studies/Labs Reviewed:   EKG:  EKG is ordered today.  The ekg ordered today demonstrates NSR, HR 65 with anterior infarct pattern and diffuse TWI along the lateral leads which has improved when compared to his prior tracing.   Recent Labs: 01/30/2019: B Natriuretic Peptide 105.0 06/26/2019: ALT 16; BUN 17; Creatinine, Ser 1.08; Hemoglobin 12.1; Platelets 173; Potassium 3.5; Sodium 139   Lipid Panel    Component Value Date/Time   CHOL 87 03/26/2019 0522   CHOL 125 02/11/2019 0847   TRIG 95 03/26/2019 0522   HDL 32 (L) 03/26/2019 0522   HDL 45 02/11/2019 0847   CHOLHDL 2.7 03/26/2019 0522   VLDL 19 03/26/2019 0522   LDLCALC 36 03/26/2019 0522   LDLCALC 64 02/11/2019 0847    Additional studies/ records that were reviewed today include:   Cardiac Catheterization: 03/26/2019  Prox RCA lesion is 50% stenosed.  Dist RCA lesion is 60% stenosed.  RPDA lesion is 50% stenosed.  Prox Cx to Mid Cx lesion is 90% stenosed.  Prox LAD lesion is 99% stenosed.  Scoring balloon angioplasty was performed using a BALLOON WOLVERINE 2.50X10.  Post intervention, there is a 20% residual stenosis.  A drug-eluting stent was successfully placed using a STENT SYNERGY DES 2.75X20.  Post intervention,  there is a  0% residual stenosis.  Dist Cx lesion is 70% stenosed.  2nd Mrg lesion is 60% stenosed.  2nd Diag lesion is 80% stenosed.  The left ventricular systolic function is normal.  LV end diastolic pressure is normal.  The left ventricular ejection fraction is 50-55% by visual estimate.  There is no mitral valve regurgitation.   1. Severe stenosis proximal LAD. Successful PTCA/DES x 1 proximal LAD 2. Severe restenosis in the mid Circumflex stented segment. Successful scoring balloon angioplasty within the old stented segment. No stent placed.  3. Moderate stenosis in the distal Circumflex branches.  4. Moderate restenosis within the old mid RCA stent. This does not appear to be flow limiting. Moderate distal RCA and PDA stenosis. 5. Low normal LV systolic function, LVEF around 50%.   Recommendations: DAPT with ASA and Plavix for at least one year. Continue beta blocker and statin.    Echocardiogram: 05/2018 IMPRESSIONS    1. The left ventricle has moderately reduced systolic function of 123456. The cavity size is normal. There is severe left ventricular wall thickness. Echo evidence of pseudonormal diastolic filling patterns.  2. Normal left atrial size.  3. Normal right atrial size.  4. The mitral valve normal in structure. Regurgitation is mild by color flow Doppler.  5. Normal tricuspid valve.  6. Tricuspid regurgitation is mild.  7. The aortic valve tricuspid. There is mild thickening of the aortic valve.  8. No atrial level shunt detected by color flow Doppler.  Assessment:    CAD  Plan:   In order of problems listed above:  1. CAD:  Previous stenting of LAD/RCA.  Cath 03/26/19 with SEMI showed tight proximal circumflex and proximal LAD restented with residual 50% instent restenosis of RCA 60% distal RCA 60% OM1, 70% distal AV circumflex and 80% small D2.  Continue medical Rx with lifelong DAT beta blocker and statin. Note had non ischemic myovue 02/2019 which  appears to have been unreliable in detecting new disease  EF normal at cath 03/26/19 and also by TEE 06/29/18 and myovue 03/20/19 He is ok to have shoulder surgery Discussed holding plavix for 5 days prior to surgery Gave him Dr Bettina Gavia and Landau's name as shoulder surgeon's in [place] Will need referral from Dr Nevada Crane   2. HTN Well controlled.  Continue current medications and low sodium Dash type diet.    3. HLD LDL at goal continue crestor    4. History of CVA continue DAT and statin no significant ICA stenosis on duplex 07/24/18    F/U with me or PA in 3 months   Medication Adjustments/Labs and Tests Ordered: Current medicines are reviewed at length with the patient today.  Concerns regarding medicines are outlined above.  Medication changes, Labs and Tests ordered today are listed in the Patient Instructions below. There are no Patient Instructions on file for this visit.   Signed, Jenkins Rouge, MD  08/19/2019 9:08 AM    [facility] [address] [place], [place] [address] Phone: [phone] Fax: [phone]

## 2019-08-19 ENCOUNTER — Other Ambulatory Visit: Payer: Self-pay

## 2019-08-19 ENCOUNTER — Ambulatory Visit (INDEPENDENT_AMBULATORY_CARE_PROVIDER_SITE_OTHER): Payer: PPO | Admitting: Cardiovascular Disease

## 2019-08-19 ENCOUNTER — Encounter: Payer: Self-pay | Admitting: Cardiovascular Disease

## 2019-08-19 VITALS — BP 126/76 | HR 63 | Temp 96.0°F | Ht 68.0 in | Wt 200.0 lb

## 2019-08-19 DIAGNOSIS — I2510 Atherosclerotic heart disease of native coronary artery without angina pectoris: Secondary | ICD-10-CM

## 2019-08-19 MED ORDER — CLOPIDOGREL BISULFATE 75 MG PO TABS: 75.0000 mg | ORAL_TABLET | Freq: Every day | ORAL | 3 refills | Status: DC

## 2019-08-19 NOTE — Patient Instructions (Addendum)
Medication Instructions:  RESTART PLAVIX 75 MG DAILY   Labwork: NONE  Testing/Procedures: NONE  Follow-Up: Your physician recommends that you schedule a follow-up appointment in: 3 MONTHS    Any Other Special Instructions Will Be Listed Below (If Applicable).     If you need a refill on your cardiac medications before your next appointment, please call your pharmacy.

## 2019-09-11 DIAGNOSIS — J019 Acute sinusitis, unspecified: Secondary | ICD-10-CM | POA: Diagnosis not present

## 2019-09-11 DIAGNOSIS — J3089 Other allergic rhinitis: Secondary | ICD-10-CM | POA: Diagnosis not present

## 2019-09-13 ENCOUNTER — Other Ambulatory Visit: Payer: Self-pay

## 2019-09-13 ENCOUNTER — Ambulatory Visit: Payer: PPO | Admitting: Physician Assistant

## 2019-09-13 ENCOUNTER — Encounter: Payer: Self-pay | Admitting: Physician Assistant

## 2019-09-13 DIAGNOSIS — L570 Actinic keratosis: Secondary | ICD-10-CM | POA: Diagnosis not present

## 2019-09-13 DIAGNOSIS — D0439 Carcinoma in situ of skin of other parts of face: Secondary | ICD-10-CM | POA: Diagnosis not present

## 2019-09-13 DIAGNOSIS — C44329 Squamous cell carcinoma of skin of other parts of face: Secondary | ICD-10-CM | POA: Diagnosis not present

## 2019-09-13 DIAGNOSIS — C4441 Basal cell carcinoma of skin of scalp and neck: Secondary | ICD-10-CM

## 2019-09-13 DIAGNOSIS — C44320 Squamous cell carcinoma of skin of unspecified parts of face: Secondary | ICD-10-CM

## 2019-09-13 DIAGNOSIS — C44310 Basal cell carcinoma of skin of unspecified parts of face: Secondary | ICD-10-CM

## 2019-09-13 DIAGNOSIS — D0430 Carcinoma in situ of skin of unspecified part of face: Secondary | ICD-10-CM

## 2019-09-13 MED ORDER — FLUOROURACIL 5 % EX CREA: TOPICAL_CREAM | Freq: Every day | CUTANEOUS | 2 refills | Status: DC

## 2019-09-13 NOTE — Patient Instructions (Signed)

## 2019-09-13 NOTE — Progress Notes (Addendum)
Follow-Up Visit   Subjective  Jeremy [name] is a 74 y.o. male who presents for the following: Annual Exam (Has couple of places across both arms and on his face with flaking. No soreness or bleeding.).   The following portions of the chart were reviewed this encounter and updated as appropriate: Tobacco  Allergies  Meds  Problems  Med Hx  Surg Hx  Fam Hx      Objective  Well appearing patient in no apparent distress; mood and affect are within normal limits.  All skin waist up examined.  Objective  Left Buccal Cheek , Left Malar Cheek, Left Parotid Area, Left Zygomatic Area, Mid Forehead, Mid Parietal Scalp, Right Anterior Neck, Right Buccal Cheek , Right Forehead (2), Right Frontal Scalp, Right Occipital Scalp, Right Parotid Area, Right Posterior Neck, Right Temple: Erythematous patches with gritty scale.  Objective  Right Temporal Scalp: Hyperkeratotic scale with pink base      Objective  Mid Forehead: Pearly papule with telangectasia. Pearly papule with telangectasia.      Objective  Right Parotid Area: Hyperkeratotic scale with pink base      Assessment & Plan  AK (actinic keratosis) (15) Right Frontal Scalp; Right Occipital Scalp; Mid Parietal Scalp; Mid Forehead; Right Temple; Left Zygomatic Area; Left Parotid Area; Right Parotid Area; Left Malar Cheek; Left Buccal Cheek ; Right Buccal Cheek ; Right Anterior Neck; Right Posterior Neck; Right Forehead (2)  Destruction of lesion - Left Buccal Cheek , Left Malar Cheek, Left Parotid Area, Left Zygomatic Area, Mid Forehead, Mid Parietal Scalp, Right Anterior Neck, Right Buccal Cheek , Right Forehead (2), Right Frontal Scalp, Right Occipital Scalp, Right Parotid Area, Right Posterior Neck, Right Temple Complexity: simple   Destruction method: cryotherapy   Informed consent: discussed and consent obtained   Timeout:  patient name, date of birth, surgical site, and procedure verified Lesion destroyed using  liquid nitrogen: Yes   Cryotherapy cycles:  1 Outcome: patient tolerated procedure well with no complications   Post-procedure details: wound care instructions given    fluorouracil (EFUDEX) 5 % cream - Left Buccal Cheek , Left Malar Cheek, Left Parotid Area, Left Zygomatic Area, Mid Forehead, Mid Parietal Scalp, Right Anterior Neck, Right Buccal Cheek , Right Forehead (2), Right Frontal Scalp, Right Occipital Scalp, Right Parotid Area, Right Posterior Neck, Right Temple  BCC (basal cell carcinoma), face Right Temporal Scalp  Destruction of lesion Complexity: simple   Destruction method: electrodesiccation and curettage   Informed consent: discussed and consent obtained   Timeout:  patient name, date of birth, surgical site, and procedure verified Anesthesia: the lesion was anesthetized in a standard fashion   Anesthetic:  1% lidocaine w/ epinephrine 1-100,000 local infiltration Curettage performed in three different directions: Yes   Curettage cycles:  3 Lesion length (cm):  1.4 Lesion width (cm):  1.4 Margin per side (cm):  0.1 Final wound size (cm):  1.6 Hemostasis achieved with:  aluminum chloride Outcome: patient tolerated procedure well with no complications   Post-procedure details: wound care instructions given    Skin / nail biopsy Type of biopsy: tangential   Informed consent: discussed and consent obtained   Timeout: patient name, date of birth, surgical site, and procedure verified   Anesthesia: the lesion was anesthetized in a standard fashion   Anesthetic:  1% lidocaine w/ epinephrine 1-100,000 local infiltration Instrument used: flexible razor blade   Hemostasis achieved with: aluminum chloride and electrodesiccation   Outcome: patient tolerated procedure well  Post-procedure details: wound care instructions given    Specimen 1 - Surgical pathology Differential Diagnosis: BCC Check Margins: No  SCC (squamous cell carcinoma), face Mid Forehead  Destruction  of lesion Complexity: simple   Destruction method: electrodesiccation and curettage   Informed consent: discussed and consent obtained   Timeout:  patient name, date of birth, surgical site, and procedure verified Anesthesia: the lesion was anesthetized in a standard fashion   Anesthetic:  1% lidocaine w/ epinephrine 1-100,000 local infiltration Curettage performed in three different directions: Yes   Electrodesiccation performed over the curetted area: Yes   Curettage cycles:  3 Lesion length (cm):  1 Lesion width (cm):  1 Margin per side (cm):  0.1 Final wound size (cm):  1.2 Hemostasis achieved with:  aluminum chloride Outcome: patient tolerated procedure well with no complications   Post-procedure details: wound care instructions given    Skin / nail biopsy Type of biopsy: tangential   Informed consent: discussed and consent obtained   Timeout: patient name, date of birth, surgical site, and procedure verified   Anesthesia: the lesion was anesthetized in a standard fashion   Anesthetic:  1% lidocaine w/ epinephrine 1-100,000 local infiltration Instrument used: flexible razor blade   Hemostasis achieved with: aluminum chloride and electrodesiccation   Outcome: patient tolerated procedure well   Post-procedure details: wound care instructions given    Specimen 3 - Surgical pathology Differential Diagnosis: scc/SK Check Margins: No  Carcinoma in situ of skin of face, unspecified location Right Parotid Area  Destruction of lesion Complexity: simple   Destruction method: electrodesiccation and curettage   Informed consent: discussed and consent obtained   Timeout:  patient name, date of birth, surgical site, and procedure verified Anesthesia: the lesion was anesthetized in a standard fashion   Anesthetic:  1% lidocaine w/ epinephrine 1-100,000 local infiltration Curettage performed in three different directions: Yes   Electrodesiccation performed over the curetted area: Yes    Curettage cycles:  3 Lesion length (cm):  1.2 Lesion width (cm):  1.2 Margin per side (cm):  0.1 Final wound size (cm):  1.4 Hemostasis achieved with:  aluminum chloride Outcome: patient tolerated procedure well with no complications   Post-procedure details: wound care instructions given    Skin / nail biopsy Type of biopsy: tangential   Informed consent: discussed and consent obtained   Timeout: patient name, date of birth, surgical site, and procedure verified   Anesthesia: the lesion was anesthetized in a standard fashion   Anesthetic:  1% lidocaine w/ epinephrine 1-100,000 local infiltration Instrument used: flexible razor blade   Hemostasis achieved with: aluminum chloride and electrodesiccation   Outcome: patient tolerated procedure well   Post-procedure details: wound care instructions given    Specimen 2 - Surgical pathology Differential Diagnosis: SCC Check Margins: No

## 2019-10-08 ENCOUNTER — Other Ambulatory Visit: Payer: Self-pay | Admitting: *Deleted

## 2019-10-08 DIAGNOSIS — I6521 Occlusion and stenosis of right carotid artery: Secondary | ICD-10-CM

## 2019-10-15 ENCOUNTER — Ambulatory Visit (HOSPITAL_COMMUNITY)
Admission: RE | Admit: 2019-10-15 | Discharge: 2019-10-15 | Disposition: A | Discharge status: Home / Self Care | Payer: PPO | Source: Ambulatory Visit | Attending: Vascular Surgery | Admitting: Vascular Surgery

## 2019-10-15 ENCOUNTER — Other Ambulatory Visit: Payer: Self-pay

## 2019-10-15 ENCOUNTER — Ambulatory Visit (INDEPENDENT_AMBULATORY_CARE_PROVIDER_SITE_OTHER): Payer: PPO | Admitting: Vascular Surgery

## 2019-10-15 ENCOUNTER — Encounter: Payer: Self-pay | Admitting: Vascular Surgery

## 2019-10-15 VITALS — BP 128/79 | HR 53 | Temp 97.8°F | Resp 20 | Ht 68.0 in | Wt 203.0 lb

## 2019-10-15 DIAGNOSIS — I6521 Occlusion and stenosis of right carotid artery: Secondary | ICD-10-CM | POA: Insufficient documentation

## 2019-10-15 NOTE — Progress Notes (Signed)
[facility] [place]  Patient name: Jeremy [name] MRN: [id number] DOB: 08-17-1945 Sex: male  REASON FOR VISIT: Follow-up extracranial cerebral vascular occlusive disease  HPI: Jeremy [name] is a 74 y.o. male here today for follow-up.  He is here with his wife.  He is known to me from prior right carotid endarterectomy for severe asymptomatic disease in August 2019.  He had a subsequent occipital CVA in January 2020 and is fully recovered from this.  He has had no new neurologic deficits.  He did have coronary event in November 2020 and had balloon angioplasty of the prior stented segment.  He has recovered well from this.  Past Medical History:  Diagnosis Date  . Arthritis   . CAD (coronary artery disease)    a. s/p prior stenting of LAD and RCA b. 03/2019: NSTEMI and required PTCA/DESx1 to the LAD and successful scoring balloon angioplasty in the previously stented segment of LCx  . Carotid stenosis, asymptomatic, right   . Carpal tunnel syndrome    bilateral  . Coronary artery disease   . GERD (gastroesophageal reflux disease)   . History of kidney stones   . Hx of colonic polyp   . Hypercholesterolemia   . Hypertension   . Myocardial infarction ([place])    hx of  . Obesity   . Squamous cell carcinoma of skin 08/09/2016   well differentiated on right outer eye - tx p bx  . Stroke [facility])     Family History  Problem Relation Age of Onset  . Other Father 84       deceased old age  . Coronary artery disease Mother 32       deceased  . Hypertension Mother   . Other Sister 31       living and healthy  . Coronary artery disease Brother   . Skin cancer Brother   . Colon cancer Neg Hx   . Pancreatic cancer Neg Hx   . Stomach cancer Neg Hx   . Esophageal cancer Neg Hx   . Liver disease Neg Hx     SOCIAL HISTORY: Social History   Tobacco Use  . Smoking status: Former Smoker    Quit date: 05/23/1988    Years since  quitting: 31.4  . Smokeless tobacco: Never Used  . Tobacco comment: smoked 2 packs per day for 20 years  Substance Use Topics  . Alcohol use: No    Allergies  Allergen Reactions  . Codeine Anaphylaxis  . Oxycontin [Oxycodone Hcl] Shortness Of Breath and Itching    Through IV- has since had oral oxycodone without issue  . Contrast Media [Iodinated Diagnostic Agents]     Patient got very nauseated. Gave 4mg  Zofran. Possibly pre medicate with Zofran   . Morphine And Related Itching    Current Outpatient Medications  Medication Sig Dispense Refill  . aspirin EC 81 MG EC tablet Take 1 tablet (81 mg total) by mouth daily. 90 tablet 3  . carvedilol (COREG) 12.5 MG tablet Take 1 tablet (12.5 mg total) by mouth 2 (two) times daily with a meal. 60 tablet 0  . clopidogrel (PLAVIX) 75 MG tablet Take 1 tablet (75 mg total) by mouth daily. 90 tablet 3  . enalapril (VASOTEC) 10 MG tablet Take 10 mg by mouth daily.    . fluorouracil (EFUDEX) 5 % cream Apply topically daily. Apply qhs for 2 weeks as tolerated. (Patient taking differently: Apply topically daily. Apply qhs for  2 weeks as tolerated. Taking as needed) 40 g 2  . gabapentin (NEURONTIN) 300 MG capsule Take 1 capsule (300 mg total) by mouth 3 (three) times daily. 90 capsule 6  . nitroGLYCERIN (NITROSTAT) 0.4 MG SL tablet Place 1 tablet (0.4 mg total) under the tongue every 5 (five) minutes as needed for chest pain. 25 tablet 0  . omeprazole (PRILOSEC) 40 MG capsule TAKE 1 CAPSULE BY MOUTH ONCE DAILY FOR REFLUX    . oxyCODONE-acetaminophen (PERCOCET) 7.5-325 MG tablet Take 1 tablet by mouth every 4 (four) hours as needed for severe pain. 20 tablet 0  . prochlorperazine (COMPAZINE) 10 MG tablet Take 1 tablet (10 mg total) by mouth 2 (two) times daily as needed (Headache). 10 tablet 0  . rosuvastatin (CRESTOR) 20 MG tablet Take 1 tablet (20 mg total) by mouth daily at 6 PM. 30 tablet 0   No current facility-administered medications for this  visit.    REVIEW OF SYSTEMS:  [X]  denotes positive finding, [ ]  denotes negative finding Cardiac  Comments:  Chest pain or chest pressure:    Shortness of breath upon exertion:    Short of breath when lying flat:    Irregular heart rhythm:        Vascular    Pain in calf, thigh, or hip brought on by ambulation:    Pain in feet at night that wakes you up from your sleep:     Blood clot in your veins:    Leg swelling:           PHYSICAL EXAM: Vitals:   10/15/19 0847 10/15/19 0849  BP: 130/73 128/79  Pulse: (!) 53   Resp: 20   Temp: 97.8 F (36.6 C)   SpO2: 98%   Weight: 92.1 kg   Height: 5\' 8"  (1.727 m)     GENERAL: The patient is a well-nourished male, in no acute distress. The vital signs are documented above. CARDIOVASCULAR: Carotid arteries are without bruits bilaterally.  He has a well-healed right carotid incision.  He has 2+ radial pulses. PULMONARY: There is good air exchange  MUSCULOSKELETAL: There are no major deformities or cyanosis. NEUROLOGIC: No focal weakness or paresthesias are detected. SKIN: There are no ulcers or rashes noted. PSYCHIATRIC: The patient has a normal affect.  DATA:  Carotid duplex today reveals widely patent endarterectomy on the right and no evidence of stenosis in his left carotid artery.  MEDICAL ISSUES: Stable status post right carotid endarterectomy August 2019.  We will continue usual activities and we will see him again in 1 year with repeat carotid duplex.  He knows to report immediately to the emergency room should he develop any neurologic deficits    Rosetta Posner, MD [facility] [place] Tel [phone] Pager [phone]

## 2019-10-29 DIAGNOSIS — E876 Hypokalemia: Secondary | ICD-10-CM | POA: Diagnosis not present

## 2019-10-29 DIAGNOSIS — E1122 Type 2 diabetes mellitus with diabetic chronic kidney disease: Secondary | ICD-10-CM | POA: Diagnosis not present

## 2019-10-29 DIAGNOSIS — E860 Dehydration: Secondary | ICD-10-CM | POA: Diagnosis not present

## 2019-10-29 DIAGNOSIS — I10 Essential (primary) hypertension: Secondary | ICD-10-CM | POA: Diagnosis not present

## 2019-10-29 DIAGNOSIS — G43009 Migraine without aura, not intractable, without status migrainosus: Secondary | ICD-10-CM | POA: Diagnosis not present

## 2019-10-29 DIAGNOSIS — I259 Chronic ischemic heart disease, unspecified: Secondary | ICD-10-CM | POA: Diagnosis not present

## 2019-10-29 DIAGNOSIS — G589 Mononeuropathy, unspecified: Secondary | ICD-10-CM | POA: Diagnosis not present

## 2019-10-29 DIAGNOSIS — E875 Hyperkalemia: Secondary | ICD-10-CM | POA: Diagnosis not present

## 2019-10-29 DIAGNOSIS — F5221 Male erectile disorder: Secondary | ICD-10-CM | POA: Diagnosis not present

## 2019-10-29 DIAGNOSIS — E782 Mixed hyperlipidemia: Secondary | ICD-10-CM | POA: Diagnosis not present

## 2019-10-29 DIAGNOSIS — I129 Hypertensive chronic kidney disease with stage 1 through stage 4 chronic kidney disease, or unspecified chronic kidney disease: Secondary | ICD-10-CM | POA: Diagnosis not present

## 2019-10-29 DIAGNOSIS — I2510 Atherosclerotic heart disease of native coronary artery without angina pectoris: Secondary | ICD-10-CM | POA: Diagnosis not present

## 2019-10-30 DIAGNOSIS — I129 Hypertensive chronic kidney disease with stage 1 through stage 4 chronic kidney disease, or unspecified chronic kidney disease: Secondary | ICD-10-CM | POA: Diagnosis not present

## 2019-10-30 DIAGNOSIS — I2510 Atherosclerotic heart disease of native coronary artery without angina pectoris: Secondary | ICD-10-CM | POA: Diagnosis not present

## 2019-10-30 DIAGNOSIS — E782 Mixed hyperlipidemia: Secondary | ICD-10-CM | POA: Diagnosis not present

## 2019-10-30 DIAGNOSIS — Z0001 Encounter for general adult medical examination with abnormal findings: Secondary | ICD-10-CM | POA: Diagnosis not present

## 2019-10-30 DIAGNOSIS — M19011 Primary osteoarthritis, right shoulder: Secondary | ICD-10-CM | POA: Diagnosis not present

## 2019-10-30 DIAGNOSIS — E669 Obesity, unspecified: Secondary | ICD-10-CM | POA: Diagnosis not present

## 2019-10-30 DIAGNOSIS — Z6830 Body mass index (BMI) 30.0-30.9, adult: Secondary | ICD-10-CM | POA: Diagnosis not present

## 2019-10-30 DIAGNOSIS — Z8673 Personal history of transient ischemic attack (TIA), and cerebral infarction without residual deficits: Secondary | ICD-10-CM | POA: Diagnosis not present

## 2019-10-30 DIAGNOSIS — N1831 Chronic kidney disease, stage 3a: Secondary | ICD-10-CM | POA: Diagnosis not present

## 2019-11-01 DIAGNOSIS — H6502 Acute serous otitis media, left ear: Secondary | ICD-10-CM | POA: Diagnosis not present

## 2019-11-04 DIAGNOSIS — H60502 Unspecified acute noninfective otitis externa, left ear: Secondary | ICD-10-CM | POA: Diagnosis not present

## 2019-11-08 NOTE — Progress Notes (Signed)
Cardiology Office Note    Date:  11/12/2019   ID:  Jeremy [name], DOB 1946-04-07, MRN [id number]  PCP:  Jeremy Squibb, MD  Cardiologist: Jeremy Rouge, MD  --> now wishes to follow-up in [facility] (evaluated by Jeremy [name] during admission)  No chief complaint on file.   History of Present Illness:    Jeremy [name] is a 74 y.o. male with past medical history of CAD (s/p prior stenting of LAD and RCA), HTN,HLD, and prior CVA with right CEA  who presents for f/u CAD. Previously followed by Jeremy [name] Lives in [place] and preferes to f/u here   He most recently presented to [facility]  ED on 03/25/2019 for evaluation of worsening chest pain and throat discomfort which had started the day of admission. EKG showed an incomplete left bundle branch block but he was found to have an NSTEMI with peak troponin of 5345. He was transferred to [facility] for a cardiac catheterization. This was performed by Jeremy. Angelena [name] on 03/26/2019 and showed 50% prox-RCA stenosis, 60% dRCA, 50% RPDA, 90% prox LCx, 99% Prox-LAD, 70% Distal LCx, 60% 2nd Mrg, 80% 2nd Diagonal and preserved EF of 50-55%. Was treated with PTCA/DESx1 to the LAD and successful scoring balloon angioplasty in the previously stented segment of LCx. He was continued on ASA and Plavix with recommendations to continue this for at least 1 year along with beta-blocker and statin therapy.  Since d/c has done well with no recurrent angina. EF has been normal Compliant with meds Discussed need for life long DAT given previous CVA and multiple interventions/stents including restenosis   Seen in ER  06/26/19 with some dizziness and elevated BP but forgot to take his BP meds and felt better after taking DC home Without change in Rx   He is frustrated with issues shoulders. He saw Jeremy Jeremy [name] and was referred to a [facility] doctor and he didn't like this Wanted to stay in [facility] system Cardiologist at [facility] d/c his plavix   He cannot lift either  shoulder above his head Has trouble feeding himself  No angina  He really hasn't pursued shoulder surgery yet between [place] and finishing a house to sell He has had his vaccine   Past Medical History:  Diagnosis Date  . Arthritis   . CAD (coronary artery disease)    a. s/p prior stenting of LAD and RCA b. 03/2019: NSTEMI and required PTCA/DESx1 to the LAD and successful scoring balloon angioplasty in the previously stented segment of LCx  . Carotid stenosis, asymptomatic, right   . Carpal tunnel syndrome    bilateral  . Coronary artery disease   . GERD (gastroesophageal reflux disease)   . History of kidney stones   . Hx of colonic polyp   . Hypercholesterolemia   . Hypertension   . Myocardial infarction ([place])    hx of  . Obesity   . Squamous cell carcinoma of skin 08/09/2016   well differentiated on right outer eye - tx p bx  . Stroke [facility])     Past Surgical History:  Procedure Laterality Date  . CARDIAC CATHETERIZATION    . CORONARY BALLOON ANGIOPLASTY N/A 03/26/2019   Procedure: CORONARY BALLOON ANGIOPLASTY;  Surgeon: Jeremy Blanks, MD;  Location: [place] CV LAB;  Service: Cardiovascular;  Laterality: N/A;  . CORONARY STENT INTERVENTION N/A 03/26/2019   Procedure: CORONARY STENT INTERVENTION;  Surgeon: Jeremy Blanks, MD;  Location: [place] CV [facility];  Service: Cardiovascular;  Laterality: N/A;  . CORONARY STENT PLACEMENT  2000   x3   . CYST EXCISION     x 2  . ENDARTERECTOMY Right 12/25/2017   Procedure: ENDARTERECTOMY CAROTID RIGHT;  Surgeon: Jeremy Posner, MD;  Location: [place];  Service: Vascular;  Laterality: Right;  . LEFT HEART CATH AND CORONARY ANGIOGRAPHY N/A 03/26/2019   Procedure: LEFT HEART CATH AND CORONARY ANGIOGRAPHY;  Surgeon: Jeremy Blanks, MD;  Location: [place] CV [facility];  Service: Cardiovascular;  Laterality: N/A;  . PATCH ANGIOPLASTY Right 12/25/2017   Procedure: PATCH ANGIOPLASTY RIGHT CAROTID ARTERY;  Surgeon: Jeremy Posner, MD;  Location: [place];  Service: Vascular;  Laterality: Right;  . TEE WITHOUT CARDIOVERSION N/A 06/29/2018   Procedure: TRANSESOPHAGEAL ECHOCARDIOGRAM (TEE);  Surgeon: Jeremy Records, MD;  Location: [facility] ENDOSCOPY;  Service: Cardiovascular;  Laterality: N/A;    Current Medications: Outpatient Medications Prior to Visit  Medication Sig Dispense Refill  . aspirin EC 81 MG EC tablet Take 1 tablet (81 mg total) by mouth daily. 90 tablet 3  . carvedilol (COREG) 12.5 MG tablet Take 1 tablet (12.5 mg total) by mouth 2 (two) times daily with a meal. 60 tablet 0  . ciprofloxacin (CILOXAN) 0.3 % ophthalmic solution     . clopidogrel (PLAVIX) 75 MG tablet Take 1 tablet (75 mg total) by mouth daily. 90 tablet 3  . dexamethasone (DECADRON) 0.1 % ophthalmic solution Instill 2 drops into the left ear twice daily for 7 days.    . enalapril (VASOTEC) 10 MG tablet Take 10 mg by mouth daily.    . fluorouracil (EFUDEX) 5 % cream Apply topically daily. Apply qhs for 2 weeks as tolerated. 40 g 2  . gabapentin (NEURONTIN) 300 MG capsule Take 1 capsule (300 mg total) by mouth 3 (three) times daily. 90 capsule 6  . nitroGLYCERIN (NITROSTAT) 0.4 MG SL tablet Place 1 tablet (0.4 mg total) under the tongue every 5 (five) minutes as needed for chest pain. 25 tablet 0  . omeprazole (PRILOSEC) 40 MG capsule TAKE 1 CAPSULE BY MOUTH ONCE DAILY FOR REFLUX    . oxyCODONE (ROXICODONE) 15 MG immediate release tablet Take 15 mg by mouth every 6 (six) hours as needed.    Marland Kitchen oxyCODONE-acetaminophen (PERCOCET) 7.5-325 MG tablet Take 1 tablet by mouth every 4 (four) hours as needed for severe pain. 20 tablet 0  . prochlorperazine (COMPAZINE) 10 MG tablet Take 1 tablet (10 mg total) by mouth 2 (two) times daily as needed (Headache). 10 tablet 0  . rosuvastatin (CRESTOR) 40 MG tablet Take 40 mg by mouth at bedtime.    . rosuvastatin (CRESTOR) 20 MG tablet Take 1 tablet (20 mg total) by mouth daily at 6 PM. 30 tablet 0   No  facility-administered medications prior to visit.     Allergies:   Codeine, Oxycontin [oxycodone hcl], Contrast media [iodinated diagnostic agents], and Morphine and related   Social History   Socioeconomic History  . Marital status: Divorced    Spouse name: Not on file  . Number of children: Not on file  . Years of education: Not on file  . Highest education level: Not on file  Occupational History  . Occupation: retired    Comment: welder  Tobacco Use  . Smoking status: Former Smoker    Quit date: 05/23/1988    Years since quitting: 31.4  . Smokeless tobacco: Never Used  . Tobacco comment: smoked 2 packs per day for 20 years  Vaping  Use  . Vaping Use: Never used  Substance and Sexual Activity  . Alcohol use: No  . Drug use: No  . Sexual activity: Not on file  Other Topics Concern  . Not on file  Social History Narrative  . Not on file   Social Determinants of Health   Financial Resource Strain:   . Difficulty of Paying Living Expenses:   Food Insecurity:   . Worried About Charity fundraiser in the Last Year:   . Arboriculturist in the Last Year:   Transportation Needs:   . Film/video editor (Medical):   Marland Kitchen Lack of Transportation (Non-Medical):   Physical Activity:   . Days of Exercise per Week:   . Minutes of Exercise per Session:   Stress:   . Feeling of Stress :   Social Connections:   . Frequency of Communication with Friends and Family:   . Frequency of Social Gatherings with Friends and Family:   . Attends Religious Services:   . Active Member of Clubs or Organizations:   . Attends Archivist Meetings:   Marland Kitchen Marital Status:      Family History:  The patient's family history includes Coronary artery disease in his brother; Coronary artery disease (age of onset: 4) in his mother; Hypertension in his mother; Other (age of onset: 5) in his father; Other (age of onset: 86) in his sister; Skin cancer in his brother.   Review of Systems:     Please see the history of present illness.     General:  No chills, fever, night sweats or weight changes. Positive for bilateral shoulder pain (occurring for several months). Cardiovascular:  No chest pain, dyspnea on exertion, edema, orthopnea, palpitations, paroxysmal nocturnal dyspnea. Dermatological: No rash, lesions/masses Respiratory: No cough, dyspnea Urologic: No hematuria, dysuria Abdominal:   No nausea, vomiting, diarrhea, bright red blood per rectum, melena, or hematemesis Neurologic:  No visual changes, wkns, changes in mental status. All other systems reviewed and are otherwise negative except as noted above.   Physical Exam:    VS:  BP (!) 144/80   Pulse 64   Ht 5\' 7"  (1.702 m)   Wt 205 lb 12.8 oz (93.4 kg)   SpO2 95%   BMI 32.23 kg/m     Affect appropriate Healthy:  appears stated age HEENT: normal Neck supple with no adenopathy JVP normal previous right CEA no thyromegaly Lungs clear with no wheezing and good diaphragmatic motion Heart:  S1/S2 no murmur, no rub, gallop or click PMI normal Abdomen: benighn, BS positve, no tenderness, no AAA no bruit.  No HSM or HJR Distal pulses intact with no bruits No edema Neuro non-focal Skin warm and dry No muscular weakness   Wt Readings from Last 3 Encounters:  11/12/19 205 lb 12.8 oz (93.4 kg)  10/15/19 203 lb (92.1 kg)  08/19/19 200 lb (90.7 kg)     Studies/Labs Reviewed:   EKG:   NSR, HR 65 with anterior infarct pattern and diffuse TWI along the lateral leads which has improved when compared to his prior tracing.   Recent Labs: 01/30/2019: B Natriuretic Peptide 105.0 06/26/2019: ALT 16; BUN 17; Creatinine, Ser 1.08; Hemoglobin 12.1; Platelets 173; Potassium 3.5; Sodium 139   Lipid Panel    Component Value Date/Time   CHOL 87 03/26/2019 0522   CHOL 125 02/11/2019 0847   TRIG 95 03/26/2019 0522   HDL 32 (L) 03/26/2019 0522   HDL 45 02/11/2019 0847  CHOLHDL 2.7 03/26/2019 0522   VLDL 19 03/26/2019  0522   LDLCALC 36 03/26/2019 0522   LDLCALC 64 02/11/2019 0847    Additional studies/ [name] that were reviewed today include:   Cardiac Catheterization: 03/26/2019  Prox RCA lesion is 50% stenosed.  Dist RCA lesion is 60% stenosed.  RPDA lesion is 50% stenosed.  Prox Cx to Mid Cx lesion is 90% stenosed.  Prox LAD lesion is 99% stenosed.  Scoring balloon angioplasty was performed using a BALLOON WOLVERINE 2.50X10.  Post intervention, there is a 20% residual stenosis.  A drug-eluting stent was successfully placed using a STENT SYNERGY DES 2.75X20.  Post intervention, there is a 0% residual stenosis.  Dist Cx lesion is 70% stenosed.  2nd Mrg lesion is 60% stenosed.  2nd Diag lesion is 80% stenosed.  The left ventricular systolic function is normal.  LV end diastolic pressure is normal.  The left ventricular ejection fraction is 50-55% by visual estimate.  There is no mitral valve regurgitation.   1. Severe stenosis proximal LAD. Successful PTCA/DES x 1 proximal LAD 2. Severe restenosis in the mid Circumflex stented segment. Successful scoring balloon angioplasty within the old stented segment. No stent placed.  3. Moderate stenosis in the distal Circumflex branches.  4. Moderate restenosis within the old mid RCA stent. This does not appear to be flow limiting. Moderate distal RCA and PDA stenosis. 5. Low normal LV systolic function, LVEF around 50%.   Recommendations: DAPT with ASA and Plavix for at least one year. Continue beta blocker and statin.    Echocardiogram: 05/2018 IMPRESSIONS    1. The left ventricle has moderately reduced systolic function of 58-52%. The cavity size is normal. There is severe left ventricular wall thickness. Echo evidence of pseudonormal diastolic filling patterns.  2. Normal left atrial size.  3. Normal right atrial size.  4. The mitral valve normal in structure. Regurgitation is mild by color flow Doppler.  5. Normal tricuspid  valve.  6. Tricuspid regurgitation is mild.  7. The aortic valve tricuspid. There is mild thickening of the aortic valve.  8. No atrial level shunt detected by color flow Doppler.  Assessment:    CAD  Plan:   In order of problems listed above:  1. CAD:  Previous stenting of LAD/RCA.  Cath 03/26/19 with SEMI showed tight proximal circumflex and proximal LAD restented with residual 50% instent restenosis of RCA 60% distal RCA 60% OM1, 70% distal AV circumflex and 80% small D2.  Continue medical Rx with lifelong DAT beta blocker and statin. Note had non ischemic myovue 02/2019 which appears to have been unreliable in detecting new disease  EF normal at cath 03/26/19 and also by TEE 06/29/18 and myovue 03/20/19 He is ok to have shoulder surgery Discussed holding plavix for 5 days prior to surgery Gave him Jeremy Bettina Gavia and Landau's name as shoulder surgeon's in [place] Will need referral from Jeremy [place] Crane   2. HTN Well controlled.  Continue current medications and low sodium Dash type diet.    3. HLD LDL at goal continue crestor    4. History of CVA continue DAT and statin previous right CEA duplex 10/15/19 patent and no significant bilateral stenosis    F/U 6 months   Medication Adjustments/Labs and Tests Ordered: Current medicines are reviewed at length with the patient today.  Concerns regarding medicines are outlined above.  Medication changes, Labs and Tests ordered today are listed in the Patient Instructions below. There are no Patient Instructions on  file for this visit.   Signed, Jeremy Rouge, MD  11/12/2019 10:13 AM    [facility] [address] [place], [place] [address] Phone: [phone] Fax: [phone]

## 2019-11-11 DIAGNOSIS — H7291 Unspecified perforation of tympanic membrane, right ear: Secondary | ICD-10-CM | POA: Diagnosis not present

## 2019-11-11 DIAGNOSIS — H60502 Unspecified acute noninfective otitis externa, left ear: Secondary | ICD-10-CM | POA: Insufficient documentation

## 2019-11-11 DIAGNOSIS — H60392 Other infective otitis externa, left ear: Secondary | ICD-10-CM | POA: Diagnosis not present

## 2019-11-12 ENCOUNTER — Other Ambulatory Visit: Payer: Self-pay

## 2019-11-12 ENCOUNTER — Ambulatory Visit: Payer: PPO | Admitting: Cardiovascular Disease

## 2019-11-12 ENCOUNTER — Encounter: Payer: Self-pay | Admitting: Cardiovascular Disease

## 2019-11-12 ENCOUNTER — Ambulatory Visit (INDEPENDENT_AMBULATORY_CARE_PROVIDER_SITE_OTHER): Payer: PPO | Admitting: Cardiovascular Disease

## 2019-11-12 VITALS — BP 144/80 | HR 64 | Ht 67.0 in | Wt 205.8 lb

## 2019-11-12 DIAGNOSIS — I2510 Atherosclerotic heart disease of native coronary artery without angina pectoris: Secondary | ICD-10-CM | POA: Diagnosis not present

## 2019-11-12 NOTE — Patient Instructions (Signed)
Medication Instructions:  Your physician recommends that you continue on your current medications as directed. Please refer to the Current Medication list given to you today.  *If you need a refill on your cardiac medications before your next appointment, please call your pharmacy*   Lab Work: None today If you have labs (blood work) drawn today and your tests are completely normal, you will receive your results only by: . MyChart Message (if you have MyChart) OR . A paper copy in the mail If you have any lab test that is abnormal or we need to change your treatment, we will call you to review the results.   Testing/Procedures: None today   Follow-Up: At [facility], you and your health needs are our priority.  As part of our continuing mission to provide you with exceptional heart care, we have created designated Provider Care Teams.  These Care Teams include your primary Cardiologist (physician) and Advanced Practice Providers (APPs -  Physician Assistants and Nurse Practitioners) who all work together to provide you with the care you need, when you need it.  We recommend signing up for the patient portal called "MyChart".  Sign up information is provided on this After Visit Summary.  MyChart is used to connect with patients for Virtual Visits (Telemedicine).  Patients are able to view lab/test results, encounter notes, upcoming appointments, etc.  Non-urgent messages can be sent to your provider as well.   To learn more about what you can do with MyChart, go to [email or web address].    Your next appointment:   6 month(s)  The format for your next appointment:   In Person  Provider:   Peter Nishan, MD   Other Instructions None      Thank you for choosing [facility] !         

## 2019-12-17 ENCOUNTER — Other Ambulatory Visit: Payer: Self-pay

## 2020-01-20 NOTE — Addendum Note (Signed)
Addended by: Warren Danes on: 01/20/2020 02:54 PM   Modules accepted: Orders

## 2020-04-06 NOTE — Progress Notes (Signed)
Cardiology Office Note    Date:  04/14/2020   ID:  Jeremy [name], DOB [birth year], MRN [id number]  PCP:  Celene Squibb, MD  Cardiologist: Jenkins Rouge, MD  --> now wishes to follow-up in [facility] (evaluated by Dr. Johnsie Cancel during admission)  No chief complaint on file.   History of Present Illness:    Jeremy [name] is a 74 y.o. male with past medical history of CAD (s/p prior stenting of LAD and RCA), HTN,HLD, and prior CVA with right CEA  who presents for f/u CAD. Previously followed by Dr Julianne Rice [facility] in [place] and preferes to f/u here   Ppresented to [facility]  ED on 03/25/2019 for evaluation of worsening chest pain and throat discomfort which had started the day of admission. EKG showed an incomplete left bundle branch block but he was found to have an NSTEMI with peak troponin of 5345. He was transferred to [facility] for a cardiac catheterization. This was performed by Dr. Angelena Form on 03/26/2019 and showed 50% prox-RCA stenosis, 60% dRCA, 50% RPDA, 90% prox LCx, 99% Prox-LAD, 70% Distal LCx, 60% 2nd Mrg, 80% 2nd Diagonal and preserved EF of 50-55%. Was treated with PTCA/DESx1 to the LAD and successful scoring balloon angioplasty in the previously stented segment of LCx. He was continued on ASA and Plavix with recommendations to continue this for at least 1 year along with beta-blocker and statin therapy.  Since d/c has done well with no recurrent angina. EF has been normal Compliant with meds Discussed need for life long DAT given previous CVA and multiple interventions/stents including restenosis   Seen in ER  06/26/19 with some dizziness and elevated BP but forgot to take his BP meds and felt better after taking DC home Without change in Rx   Shoulders seem to have improved and he can get both over his head Not pursuing surgery at this time Active with no angina Indicates his son has CAD and wants to be seen as new patient Use to see Lia Foyer   Past Medical History:   Diagnosis Date  . Arthritis   . CAD (coronary artery disease)    a. s/p prior stenting of LAD and RCA b. 03/2019: NSTEMI and required PTCA/DESx1 to the LAD and successful scoring balloon angioplasty in the previously stented segment of LCx  . Carotid stenosis, asymptomatic, right   . Carpal tunnel syndrome    bilateral  . Coronary artery disease   . GERD (gastroesophageal reflux disease)   . History of kidney stones   . Hx of colonic polyp   . Hypercholesterolemia   . Hypertension   . Myocardial infarction ([place])    hx of  . Obesity   . Squamous cell carcinoma of skin 08/09/2016   well differentiated on right outer eye - tx p bx  . Stroke [facility])     Past Surgical History:  Procedure Laterality Date  . CARDIAC CATHETERIZATION    . CORONARY BALLOON ANGIOPLASTY N/A 03/26/2019   Procedure: CORONARY BALLOON ANGIOPLASTY;  Surgeon: Burnell Blanks, MD;  Location: [place] CV LAB;  Service: Cardiovascular;  Laterality: N/A;  . CORONARY STENT INTERVENTION N/A 03/26/2019   Procedure: CORONARY STENT INTERVENTION;  Surgeon: Burnell Blanks, MD;  Location: [place] CV [facility];  Service: Cardiovascular;  Laterality: N/A;  . CORONARY STENT PLACEMENT  2000   x3   . CYST EXCISION     x 2  . ENDARTERECTOMY Right 12/25/2017   Procedure: ENDARTERECTOMY CAROTID RIGHT;  Surgeon: Rosetta Posner, MD;  Location: [place];  Service: Vascular;  Laterality: Right;  . LEFT HEART CATH AND CORONARY ANGIOGRAPHY N/A 03/26/2019   Procedure: LEFT HEART CATH AND CORONARY ANGIOGRAPHY;  Surgeon: Burnell Blanks, MD;  Location: [place] CV [facility];  Service: Cardiovascular;  Laterality: N/A;  . PATCH ANGIOPLASTY Right 12/25/2017   Procedure: PATCH ANGIOPLASTY RIGHT CAROTID ARTERY;  Surgeon: Rosetta Posner, MD;  Location: [place];  Service: Vascular;  Laterality: Right;  . TEE WITHOUT CARDIOVERSION N/A 06/29/2018   Procedure: TRANSESOPHAGEAL ECHOCARDIOGRAM (TEE);  Surgeon: Fay Records, MD;  Location: [facility]  ENDOSCOPY;  Service: Cardiovascular;  Laterality: N/A;    Current Medications: Outpatient Medications Prior to Visit  Medication Sig Dispense Refill  . aspirin EC 81 MG EC tablet Take 1 tablet (81 mg total) by mouth daily. 90 tablet 3  . carvedilol (COREG) 12.5 MG tablet Take 1 tablet (12.5 mg total) by mouth 2 (two) times daily with a meal. 60 tablet 0  . ciprofloxacin (CILOXAN) 0.3 % ophthalmic solution     . clopidogrel (PLAVIX) 75 MG tablet Take 1 tablet (75 mg total) by mouth daily. 90 tablet 3  . dexamethasone (DECADRON) 0.1 % ophthalmic solution Instill 2 drops into the left ear twice daily for 7 days.    . enalapril (VASOTEC) 10 MG tablet Take 10 mg by mouth daily.    . fluorouracil (EFUDEX) 5 % cream Apply topically daily. Apply qhs for 2 weeks as tolerated. 40 g 2  . gabapentin (NEURONTIN) 300 MG capsule Take 1 capsule (300 mg total) by mouth 3 (three) times daily. 90 capsule 6  . nitroGLYCERIN (NITROSTAT) 0.4 MG SL tablet Place 1 tablet (0.4 mg total) under the tongue every 5 (five) minutes as needed for chest pain. 25 tablet 0  . omeprazole (PRILOSEC) 40 MG capsule TAKE 1 CAPSULE BY MOUTH ONCE DAILY FOR REFLUX    . oxyCODONE (ROXICODONE) 15 MG immediate release tablet Take 15 mg by mouth every 6 (six) hours as needed.    . prochlorperazine (COMPAZINE) 10 MG tablet Take 1 tablet (10 mg total) by mouth 2 (two) times daily as needed (Headache). 10 tablet 0  . rosuvastatin (CRESTOR) 40 MG tablet Take 40 mg by mouth at bedtime.     No facility-administered medications prior to visit.     Allergies:   Codeine, Oxycontin [oxycodone hcl], Contrast media [iodinated diagnostic agents], and Morphine and related   Social History   Socioeconomic History  . Marital status: Divorced    Spouse name: Not on file  . Number of children: Not on file  . Years of education: Not on file  . Highest education level: Not on file  Occupational History  . Occupation: retired    Comment: welder   Tobacco Use  . Smoking status: Former Smoker    Quit date: 05/23/1988    Years since quitting: 31.9  . Smokeless tobacco: Never Used  . Tobacco comment: smoked 2 packs per day for 20 years  Vaping Use  . Vaping Use: Never used  Substance and Sexual Activity  . Alcohol use: No  . Drug use: No  . Sexual activity: Not on file  Other Topics Concern  . Not on file  Social History Narrative  . Not on file   Social Determinants of Health   Financial Resource Strain:   . Difficulty of Paying Living Expenses: Not on file  Food Insecurity:   . Worried About Charity fundraiser  in the Last Year: Not on file  . Ran Out of Food in the Last Year: Not on file  Transportation Needs:   . Lack of Transportation (Medical): Not on file  . Lack of Transportation (Non-Medical): Not on file  Physical Activity:   . Days of Exercise per Week: Not on file  . Minutes of Exercise per Session: Not on file  Stress:   . Feeling of Stress : Not on file  Social Connections:   . Frequency of Communication with Friends and Family: Not on file  . Frequency of Social Gatherings with Friends and Family: Not on file  . Attends Religious Services: Not on file  . Active Member of Clubs or Organizations: Not on file  . Attends Archivist Meetings: Not on file  . Marital Status: Not on file     Family History:  The patient's family history includes Coronary artery disease in his brother; Coronary artery disease (age of onset: 97) in his mother; Hypertension in his mother; Other (age of onset: 46) in his father; Other (age of onset: 29) in his sister; Skin cancer in his brother.   Review of Systems:   Please see the history of present illness.     General:  No chills, fever, night sweats or weight changes. Positive for bilateral shoulder pain (occurring for several months). Cardiovascular:  No chest pain, dyspnea on exertion, edema, orthopnea, palpitations, paroxysmal nocturnal dyspnea. Dermatological:  No rash, lesions/masses Respiratory: No cough, dyspnea Urologic: No hematuria, dysuria Abdominal:   No nausea, vomiting, diarrhea, bright red blood per rectum, melena, or hematemesis Neurologic:  No visual changes, wkns, changes in mental status. All other systems reviewed and are otherwise negative except as noted above.   Physical Exam:    VS:  BP 118/60   Pulse 62   Ht 5\' 7"  (1.702 m)   Wt 97.5 kg   BMI 33.67 kg/m     Affect appropriate Healthy:  appears stated age 25: normal Neck supple with no adenopathy JVP normal previous right CEA no thyromegaly Lungs clear with no wheezing and good diaphragmatic motion Heart:  S1/S2 no murmur, no rub, gallop or click PMI normal Abdomen: benighn, BS positve, no tenderness, no AAA no bruit.  No HSM or HJR Distal pulses intact with no bruits No edema Neuro non-focal Skin warm and dry Decreased ROM both shoulders unable to lift over head     Wt Readings from Last 3 Encounters:  04/14/20 97.5 kg  11/12/19 93.4 kg  10/15/19 92.1 kg     Studies/Labs Reviewed:   EKG:   NSR, HR 65 with anterior infarct pattern and diffuse TWI along the lateral leads which has improved when compared to his prior tracing.   Recent Labs: 06/26/2019: ALT 16; BUN 17; Creatinine, Ser 1.08; Hemoglobin 12.1; Platelets 173; Potassium 3.5; Sodium 139   Lipid Panel    Component Value Date/Time   CHOL 87 03/26/2019 0522   CHOL 125 02/11/2019 0847   TRIG 95 03/26/2019 0522   HDL 32 (L) 03/26/2019 0522   HDL 45 02/11/2019 0847   CHOLHDL 2.7 03/26/2019 0522   VLDL 19 03/26/2019 0522   LDLCALC 36 03/26/2019 0522   LDLCALC 64 02/11/2019 0847    Additional studies/ records that were reviewed today include:   Cardiac Catheterization: 03/26/2019  Prox RCA lesion is 50% stenosed.  Dist RCA lesion is 60% stenosed.  RPDA lesion is 50% stenosed.  Prox Cx to Mid Cx lesion is 90% stenosed.  Prox LAD lesion is 99% stenosed.  Scoring balloon angioplasty  was performed using a BALLOON WOLVERINE 2.50X10.  Post intervention, there is a 20% residual stenosis.  A drug-eluting stent was successfully placed using a STENT SYNERGY DES 2.75X20.  Post intervention, there is a 0% residual stenosis.  Dist Cx lesion is 70% stenosed.  2nd Mrg lesion is 60% stenosed.  2nd Diag lesion is 80% stenosed.  The left ventricular systolic function is normal.  LV end diastolic pressure is normal.  The left ventricular ejection fraction is 50-55% by visual estimate.  There is no mitral valve regurgitation.   1. Severe stenosis proximal LAD. Successful PTCA/DES x 1 proximal LAD 2. Severe restenosis in the mid Circumflex stented segment. Successful scoring balloon angioplasty within the old stented segment. No stent placed.  3. Moderate stenosis in the distal Circumflex branches.  4. Moderate restenosis within the old mid RCA stent. This does not appear to be flow limiting. Moderate distal RCA and PDA stenosis. 5. Low normal LV systolic function, LVEF around 50%.   Recommendations: DAPT with ASA and Plavix for at least one year. Continue beta blocker and statin.    Echocardiogram: 05/2018 IMPRESSIONS    1. The left ventricle has moderately reduced systolic function of 95-09%. The cavity size is normal. There is severe left ventricular wall thickness. Echo evidence of pseudonormal diastolic filling patterns.  2. Normal left atrial size.  3. Normal right atrial size.  4. The mitral valve normal in structure. Regurgitation is mild by color flow Doppler.  5. Normal tricuspid valve.  6. Tricuspid regurgitation is mild.  7. The aortic valve tricuspid. There is mild thickening of the aortic valve.  8. No atrial level shunt detected by color flow Doppler.  Assessment:    CAD  Plan:   In order of problems listed above:  1. CAD:  Previous stenting of LAD/RCA.  Cath 03/26/19 with SEMI showed tight proximal circumflex and proximal LAD restented with  residual 50% instent restenosis of RCA 60% distal RCA 60% OM1, 70% distal AV circumflex and 80% small D2.  Continue medical Rx with lifelong DAT beta blocker and statin. Note had non ischemic myovue 02/2019 which appears to have been unreliable in detecting new disease  EF normal at cath 03/26/19 and also by TEE 06/29/18 and myovue 03/20/19 He is ok to have shoulder surgery Discussed holding plavix for 5 days prior to surgery Gave him Dr Bettina Gavia and Landau's name as shoulder surgeon's in [place] Will need referral from Dr Nevada Crane   Note myovue 03/20/19 showed no ischemia prior to intervention 03/26/19   2. HTN Well controlled.  Continue current medications and low sodium Dash type diet.    3. HLD LDL at goal continue crestor    4. History of CVA continue DAT and statin previous right CEA duplex 10/15/19 patent and no significant bilateral stenosis F/U Dr Donnetta Hutching [facility]    F/U 6 months   Medication Adjustments/Labs and Tests Ordered: Current medicines are reviewed at length with the patient today.  Concerns regarding medicines are outlined above.  Medication changes, Labs and Tests ordered today are listed in the Patient Instructions below. There are no Patient Instructions on file for this visit.   Signed, Jenkins Rouge, MD  04/14/2020 10:09 AM    [facility] [address] [place], [place] [address] Phone: [phone] Fax: [phone]

## 2020-04-14 ENCOUNTER — Ambulatory Visit (INDEPENDENT_AMBULATORY_CARE_PROVIDER_SITE_OTHER): Payer: PPO | Admitting: Cardiovascular Disease

## 2020-04-14 ENCOUNTER — Other Ambulatory Visit: Payer: Self-pay

## 2020-04-14 ENCOUNTER — Encounter: Payer: Self-pay | Admitting: Cardiovascular Disease

## 2020-04-14 VITALS — BP 118/60 | HR 62 | Ht 67.0 in | Wt 215.0 lb

## 2020-04-14 DIAGNOSIS — I2510 Atherosclerotic heart disease of native coronary artery without angina pectoris: Secondary | ICD-10-CM | POA: Diagnosis not present

## 2020-04-14 NOTE — Patient Instructions (Signed)
Medication Instructions:  Your physician recommends that you continue on your current medications as directed. Please refer to the Current Medication list given to you today.  *If you need a refill on your cardiac medications before your next appointment, please call your pharmacy*   Lab Work: None today If you have labs (blood work) drawn today and your tests are completely normal, you will receive your results only by: Marland Kitchen MyChart Message (if you have MyChart) OR . A paper copy in the mail If you have any lab test that is abnormal or we need to change your treatment, we will call you to review the results.   Testing/Procedures: None today   Follow-Up: At [facility], you and your health needs are our priority.  As part of our continuing mission to provide you with exceptional heart care, we have created designated Provider Care Teams.  These Care Teams include your primary Cardiologist (physician) and Advanced Practice Providers (APPs -  Physician Assistants and Nurse Practitioners) who all work together to provide you with the care you need, when you need it.  We recommend signing up for the patient portal called "MyChart".  Sign up information is provided on this After Visit Summary.  MyChart is used to connect with patients for Virtual Visits (Telemedicine).  Patients are able to view lab/test results, encounter notes, upcoming appointments, etc.  Non-urgent messages can be sent to your provider as well.   To learn more about what you can do with MyChart, go to [email or web address].    Your next appointment:   6 month(s)  The format for your next appointment:   In Person  Provider:   Jenkins Rouge, MD   Other Instructions Make apt for son with Dr.Nishan        Thank you for choosing [facility] !

## 2020-05-08 DIAGNOSIS — R1012 Left upper quadrant pain: Secondary | ICD-10-CM | POA: Diagnosis not present

## 2020-05-08 DIAGNOSIS — N400 Enlarged prostate without lower urinary tract symptoms: Secondary | ICD-10-CM | POA: Diagnosis not present

## 2020-05-08 DIAGNOSIS — I259 Chronic ischemic heart disease, unspecified: Secondary | ICD-10-CM | POA: Diagnosis not present

## 2020-05-08 DIAGNOSIS — E782 Mixed hyperlipidemia: Secondary | ICD-10-CM | POA: Diagnosis not present

## 2020-05-08 DIAGNOSIS — N401 Enlarged prostate with lower urinary tract symptoms: Secondary | ICD-10-CM | POA: Diagnosis not present

## 2020-05-08 DIAGNOSIS — N529 Male erectile dysfunction, unspecified: Secondary | ICD-10-CM | POA: Diagnosis not present

## 2020-05-08 DIAGNOSIS — E1122 Type 2 diabetes mellitus with diabetic chronic kidney disease: Secondary | ICD-10-CM | POA: Diagnosis not present

## 2020-05-08 DIAGNOSIS — M5440 Lumbago with sciatica, unspecified side: Secondary | ICD-10-CM | POA: Diagnosis not present

## 2020-05-08 DIAGNOSIS — I10 Essential (primary) hypertension: Secondary | ICD-10-CM | POA: Diagnosis not present

## 2020-05-12 DIAGNOSIS — M19011 Primary osteoarthritis, right shoulder: Secondary | ICD-10-CM | POA: Diagnosis not present

## 2020-05-12 DIAGNOSIS — Z6831 Body mass index (BMI) 31.0-31.9, adult: Secondary | ICD-10-CM | POA: Diagnosis not present

## 2020-05-12 DIAGNOSIS — I129 Hypertensive chronic kidney disease with stage 1 through stage 4 chronic kidney disease, or unspecified chronic kidney disease: Secondary | ICD-10-CM | POA: Diagnosis not present

## 2020-05-12 DIAGNOSIS — I700 Atherosclerosis of aorta: Secondary | ICD-10-CM | POA: Diagnosis not present

## 2020-05-12 DIAGNOSIS — Z8673 Personal history of transient ischemic attack (TIA), and cerebral infarction without residual deficits: Secondary | ICD-10-CM | POA: Diagnosis not present

## 2020-05-12 DIAGNOSIS — N1831 Chronic kidney disease, stage 3a: Secondary | ICD-10-CM | POA: Diagnosis not present

## 2020-05-12 DIAGNOSIS — I2510 Atherosclerotic heart disease of native coronary artery without angina pectoris: Secondary | ICD-10-CM | POA: Diagnosis not present

## 2020-05-12 DIAGNOSIS — E782 Mixed hyperlipidemia: Secondary | ICD-10-CM | POA: Diagnosis not present

## 2020-05-12 DIAGNOSIS — E6609 Other obesity due to excess calories: Secondary | ICD-10-CM | POA: Diagnosis not present

## 2020-05-18 ENCOUNTER — Other Ambulatory Visit: Payer: Self-pay | Admitting: Internal Medicine

## 2020-05-18 ENCOUNTER — Other Ambulatory Visit (HOSPITAL_COMMUNITY): Payer: Self-pay | Admitting: Internal Medicine

## 2020-05-18 DIAGNOSIS — Z8673 Personal history of transient ischemic attack (TIA), and cerebral infarction without residual deficits: Secondary | ICD-10-CM

## 2020-05-19 ENCOUNTER — Encounter (HOSPITAL_COMMUNITY): Payer: Self-pay

## 2020-05-19 ENCOUNTER — Ambulatory Visit (HOSPITAL_COMMUNITY)
Admission: RE | Admit: 2020-05-19 | Discharge: 2020-05-19 | Disposition: A | Discharge status: Home / Self Care | Payer: Medicare HMO | Source: Ambulatory Visit | Attending: Internal Medicine | Admitting: Internal Medicine

## 2020-05-19 ENCOUNTER — Other Ambulatory Visit: Payer: Self-pay

## 2020-05-19 DIAGNOSIS — Z8673 Personal history of transient ischemic attack (TIA), and cerebral infarction without residual deficits: Secondary | ICD-10-CM

## 2020-05-21 ENCOUNTER — Other Ambulatory Visit: Payer: Self-pay

## 2020-05-21 ENCOUNTER — Ambulatory Visit (HOSPITAL_COMMUNITY)
Admission: RE | Admit: 2020-05-21 | Discharge: 2020-05-21 | Disposition: A | Discharge status: Home / Self Care | Payer: Medicare HMO | Source: Ambulatory Visit | Attending: Internal Medicine | Admitting: Internal Medicine

## 2020-05-21 DIAGNOSIS — I6789 Other cerebrovascular disease: Secondary | ICD-10-CM | POA: Insufficient documentation

## 2020-05-21 DIAGNOSIS — Z8673 Personal history of transient ischemic attack (TIA), and cerebral infarction without residual deficits: Secondary | ICD-10-CM | POA: Insufficient documentation

## 2020-05-21 DIAGNOSIS — G3189 Other specified degenerative diseases of nervous system: Secondary | ICD-10-CM | POA: Diagnosis not present

## 2020-05-21 DIAGNOSIS — I6389 Other cerebral infarction: Secondary | ICD-10-CM | POA: Diagnosis not present

## 2020-05-21 DIAGNOSIS — G319 Degenerative disease of nervous system, unspecified: Secondary | ICD-10-CM | POA: Diagnosis not present

## 2020-07-20 DIAGNOSIS — E782 Mixed hyperlipidemia: Secondary | ICD-10-CM | POA: Diagnosis not present

## 2020-07-20 DIAGNOSIS — I2510 Atherosclerotic heart disease of native coronary artery without angina pectoris: Secondary | ICD-10-CM | POA: Diagnosis not present

## 2020-07-20 DIAGNOSIS — E1122 Type 2 diabetes mellitus with diabetic chronic kidney disease: Secondary | ICD-10-CM | POA: Diagnosis not present

## 2020-07-20 DIAGNOSIS — I10 Essential (primary) hypertension: Secondary | ICD-10-CM | POA: Diagnosis not present

## 2020-07-21 HISTORY — PX: TEAR DUCT PROBING: SHX793

## 2020-08-03 DIAGNOSIS — H0259 Other disorders affecting eyelid function: Secondary | ICD-10-CM | POA: Diagnosis not present

## 2020-08-03 DIAGNOSIS — H11152 Pinguecula, left eye: Secondary | ICD-10-CM | POA: Diagnosis not present

## 2020-08-03 DIAGNOSIS — H04223 Epiphora due to insufficient drainage, bilateral lacrimal glands: Secondary | ICD-10-CM | POA: Diagnosis not present

## 2020-08-25 DIAGNOSIS — Z7902 Long term (current) use of antithrombotics/antiplatelets: Secondary | ICD-10-CM | POA: Diagnosis not present

## 2020-08-25 DIAGNOSIS — H02125 Mechanical ectropion of left lower eyelid: Secondary | ICD-10-CM | POA: Diagnosis not present

## 2020-08-25 DIAGNOSIS — I2510 Atherosclerotic heart disease of native coronary artery without angina pectoris: Secondary | ICD-10-CM | POA: Diagnosis not present

## 2020-08-25 DIAGNOSIS — I252 Old myocardial infarction: Secondary | ICD-10-CM | POA: Diagnosis not present

## 2020-08-25 DIAGNOSIS — H04522 Eversion of left lacrimal punctum: Secondary | ICD-10-CM | POA: Diagnosis not present

## 2020-08-25 DIAGNOSIS — H02135 Senile ectropion of left lower eyelid: Secondary | ICD-10-CM | POA: Diagnosis not present

## 2020-08-25 DIAGNOSIS — H02102 Unspecified ectropion of right lower eyelid: Secondary | ICD-10-CM | POA: Diagnosis not present

## 2020-08-25 DIAGNOSIS — H04523 Eversion of bilateral lacrimal punctum: Secondary | ICD-10-CM | POA: Diagnosis not present

## 2020-08-25 DIAGNOSIS — Z6832 Body mass index (BMI) 32.0-32.9, adult: Secondary | ICD-10-CM | POA: Diagnosis not present

## 2020-08-25 DIAGNOSIS — H02132 Senile ectropion of right lower eyelid: Secondary | ICD-10-CM | POA: Diagnosis not present

## 2020-08-25 DIAGNOSIS — H02105 Unspecified ectropion of left lower eyelid: Secondary | ICD-10-CM | POA: Diagnosis not present

## 2020-08-25 DIAGNOSIS — E669 Obesity, unspecified: Secondary | ICD-10-CM | POA: Diagnosis not present

## 2020-08-25 DIAGNOSIS — H04521 Eversion of right lacrimal punctum: Secondary | ICD-10-CM | POA: Diagnosis not present

## 2020-08-25 DIAGNOSIS — H02122 Mechanical ectropion of right lower eyelid: Secondary | ICD-10-CM | POA: Diagnosis not present

## 2020-08-25 DIAGNOSIS — I10 Essential (primary) hypertension: Secondary | ICD-10-CM | POA: Diagnosis not present

## 2020-09-01 ENCOUNTER — Ambulatory Visit: Payer: PPO | Admitting: Vascular Surgery

## 2020-09-01 ENCOUNTER — Encounter (HOSPITAL_COMMUNITY): Payer: PPO

## 2020-09-10 IMAGING — DX DG CHEST 1V PORT
1 series · 1 of 1 positions shown · non-contrast
Comparison: 06/20/2018

CLINICAL DATA: Seizure activity, check endotracheal tube placement

EXAM:
PORTABLE CHEST 1 VIEW

[chest ap]
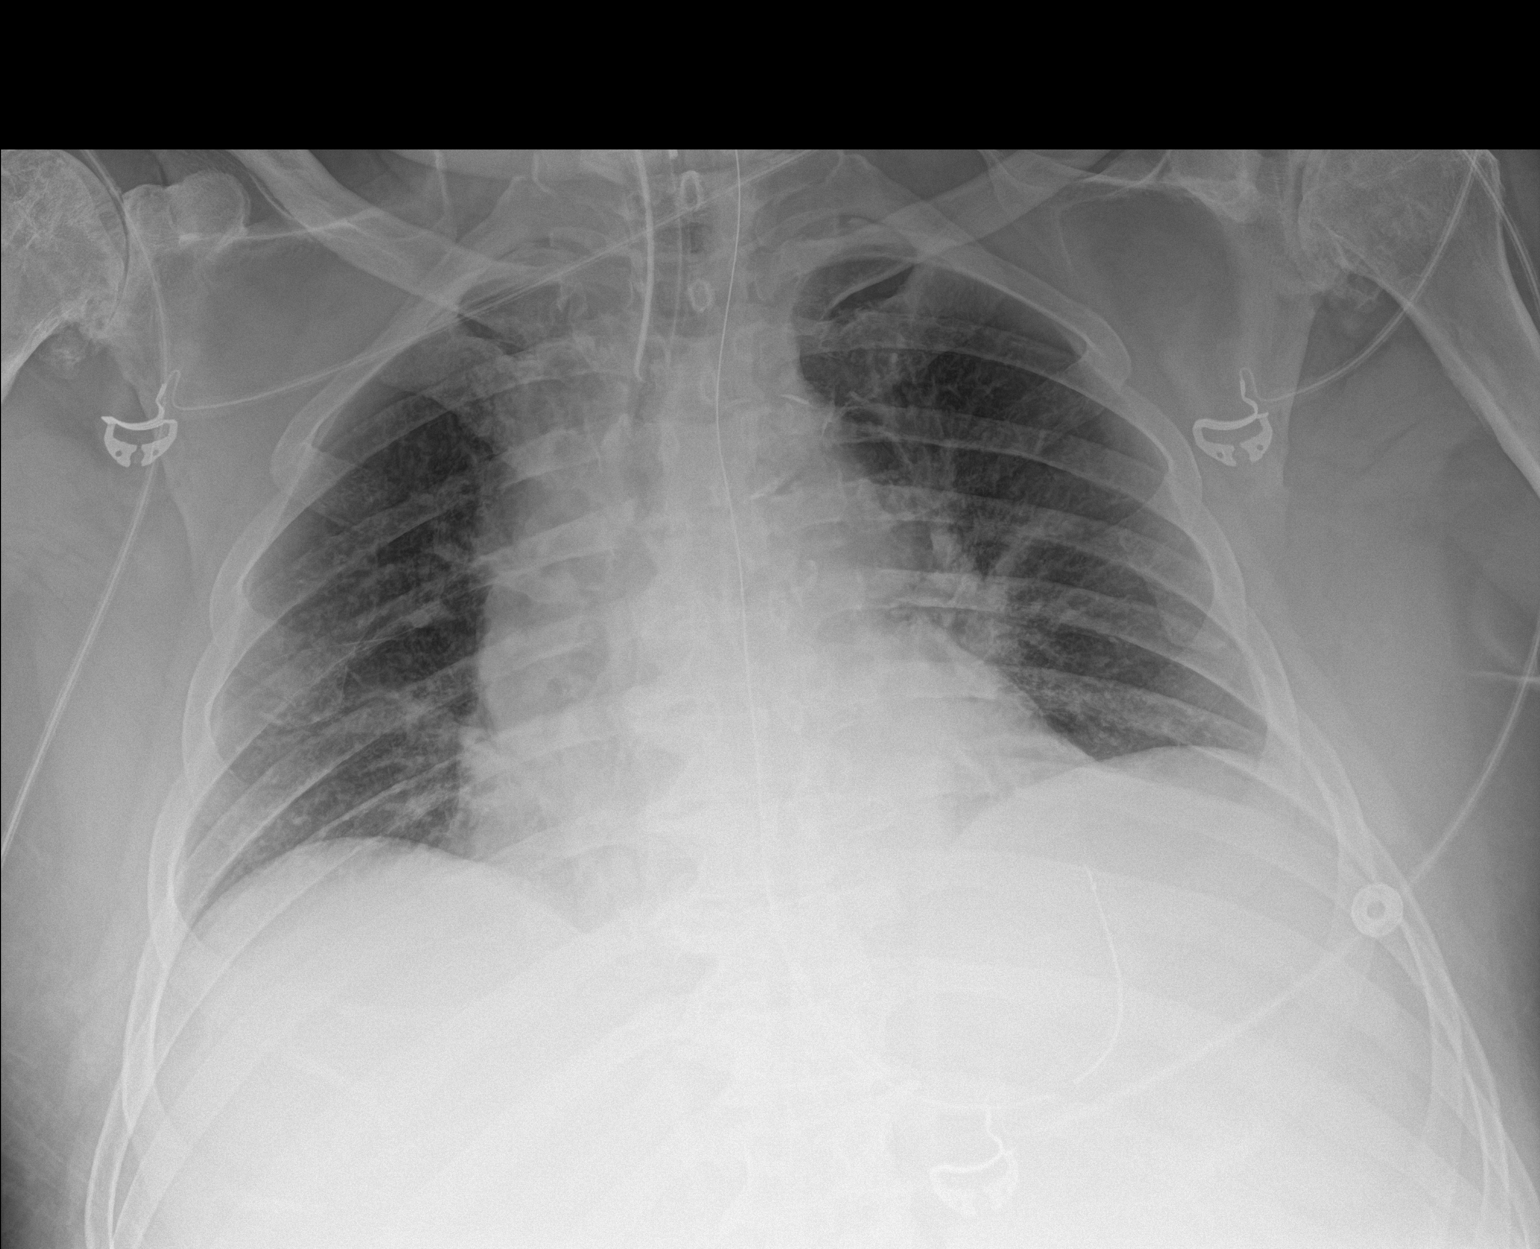

[1 of 1 positions shown; findings below may reference images not displayed]

FINDINGS: The endotracheal tube has been withdrawn and now lies 5.2 cm above
the carina. Gastric catheter is noted within the stomach. The
cardiac shadow is prominent but accentuated by the portable
technique. The lungs are clear. No acute bony abnormality is noted.
IMPRESSION: Tubes and lines as described.

Improved aeration although the overall inspiratory effort remains
poor.

## 2020-09-10 IMAGING — CT CT HEAD W/O CM
4 series · 16 of 47 positions shown, 18 images · non-contrast
Comparison: CT scan June 20, 2018.  MRI June 20, 2018.

CLINICAL DATA: New onset seizures.  Stroke follow-up.

EXAM:
CT HEAD WITHOUT CONTRAST
TECHNIQUE: Contiguous axial images were obtained from the base of the skull
through the vertex without intravenous contrast.

[Series 3: head wo · axial · 0.43mm/px · z∈[-243,-108]mm · 7 of 39 slices shown, 9 images]
[im 5/39  brain]
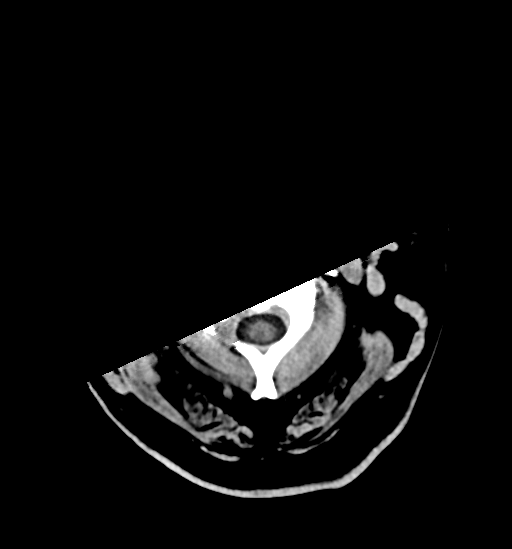
[im 5/39  bone]
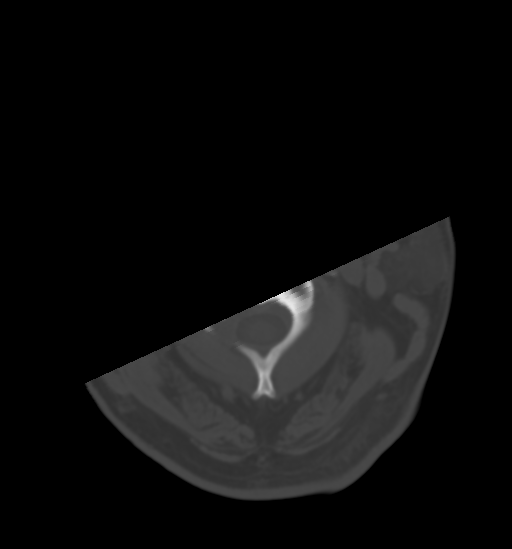
[im 10/39  brain]
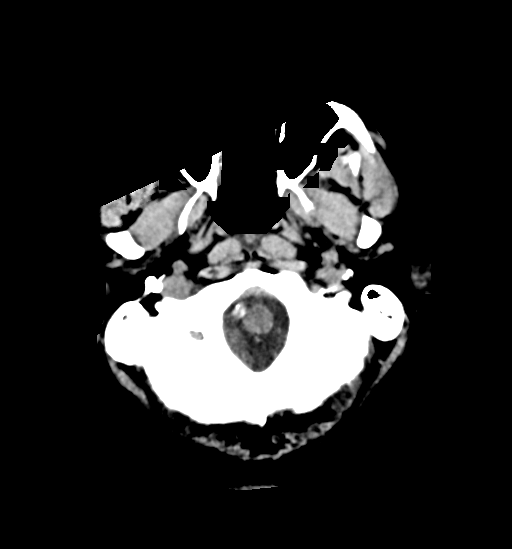
[im 15/39  brain]
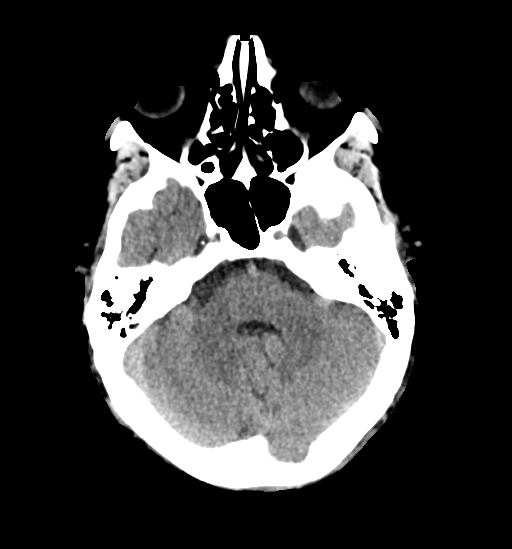
[im 20/39  brain]
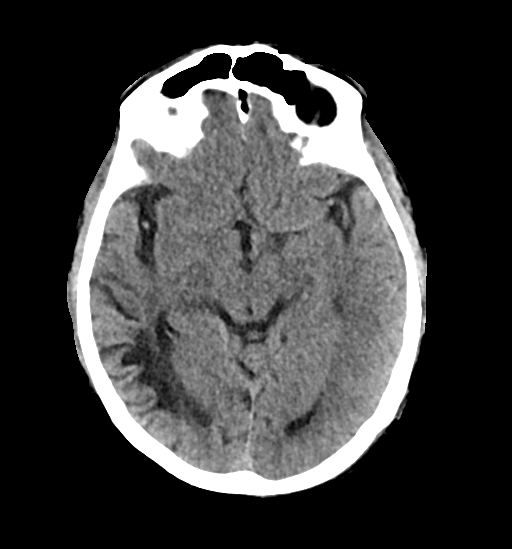
[im 24/39  brain]
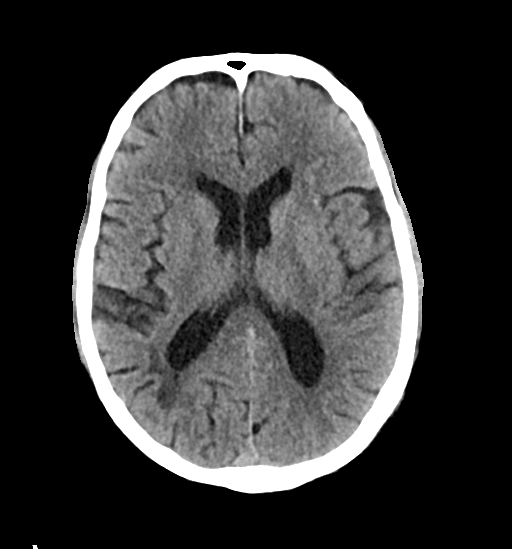
[im 24/39  bone]
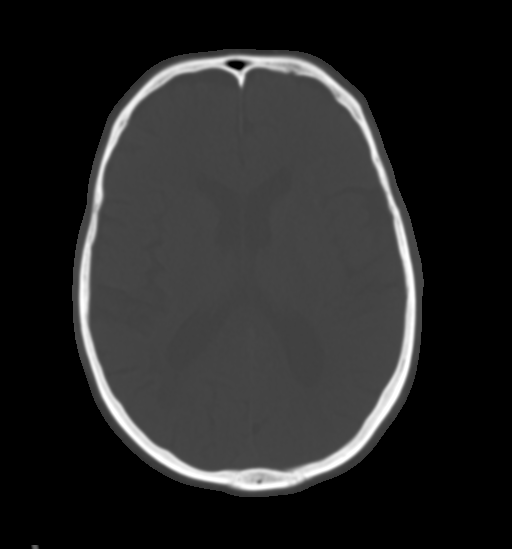
[im 29/39  brain]
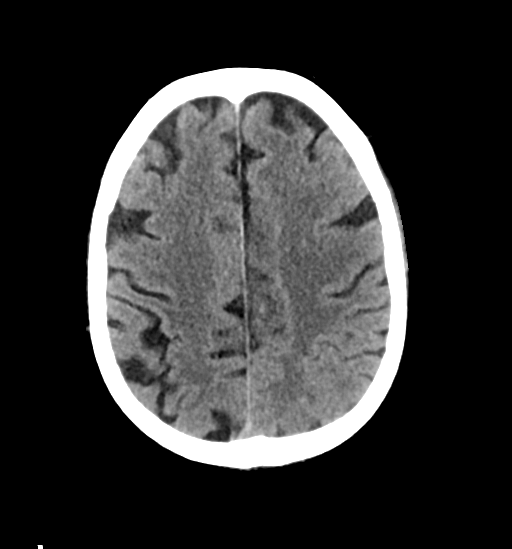
[im 34/39  brain]
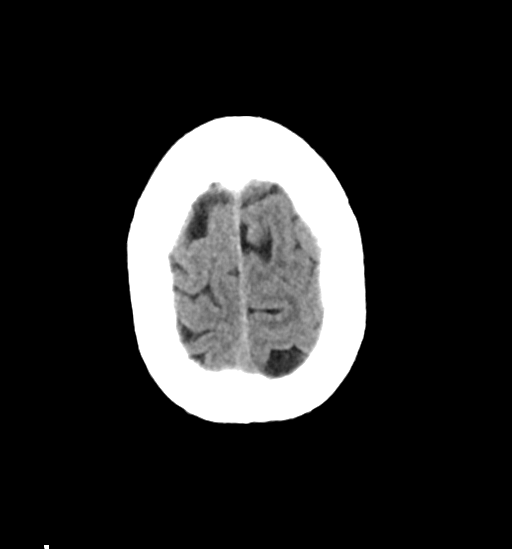

[Series 4: head bone · axial · 0.47mm/px · z∈[-167,-133]mm · 3 of 85 slices shown]
[im 9/85  bone]
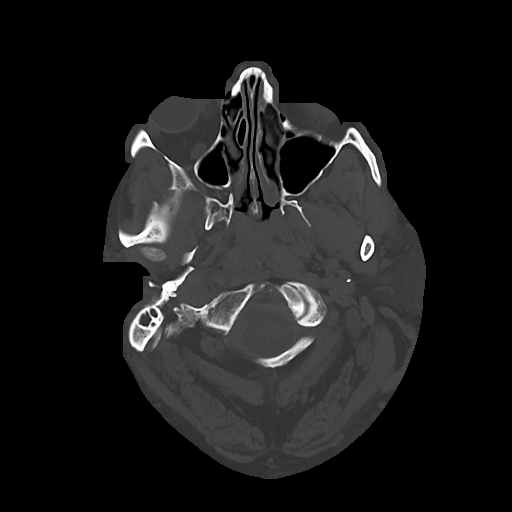
[im 17/85  bone]
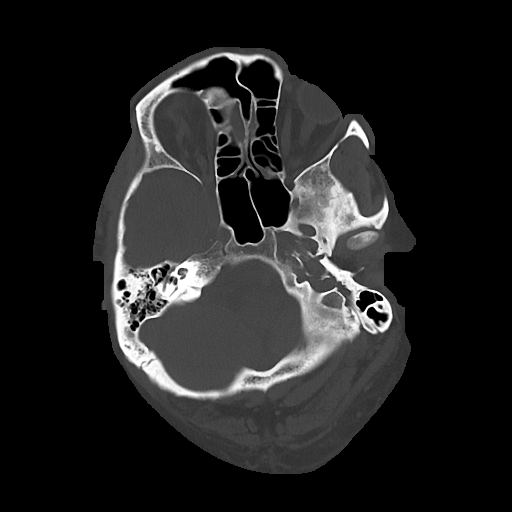
[im 26/85  bone]
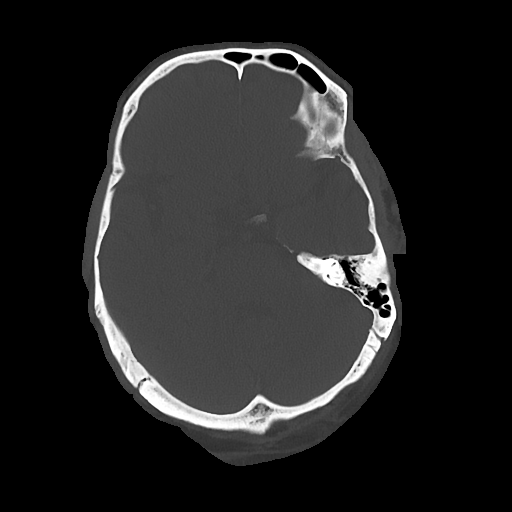

[Series 5: cor soft · coronal · 0.35mm/px · 3 of 81 slices shown]
[im 27/81  brain]
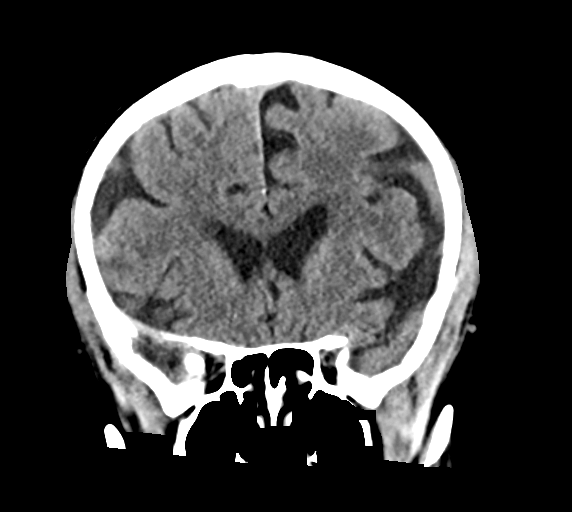
[im 36/81  brain]
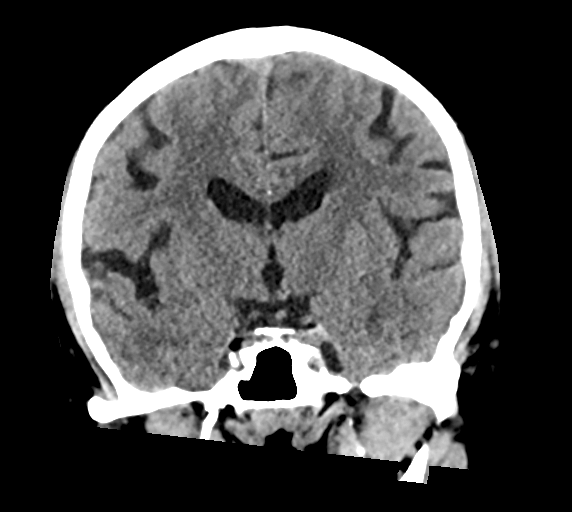
[im 45/81  brain]
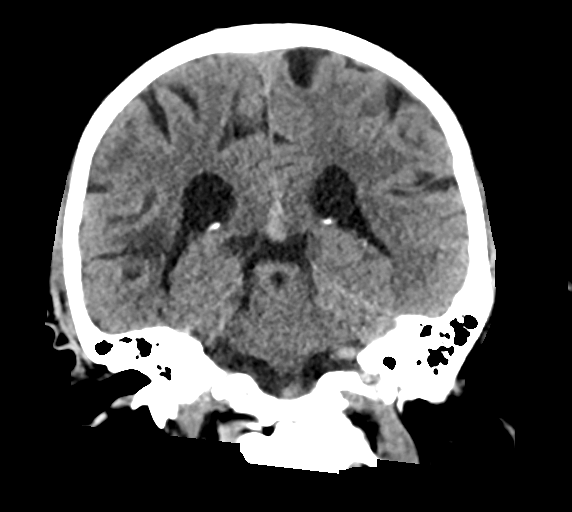

[Series 6: sag soft · sagittal · 0.38mm/px · 3 of 67 slices shown]
[im 23/67  brain]
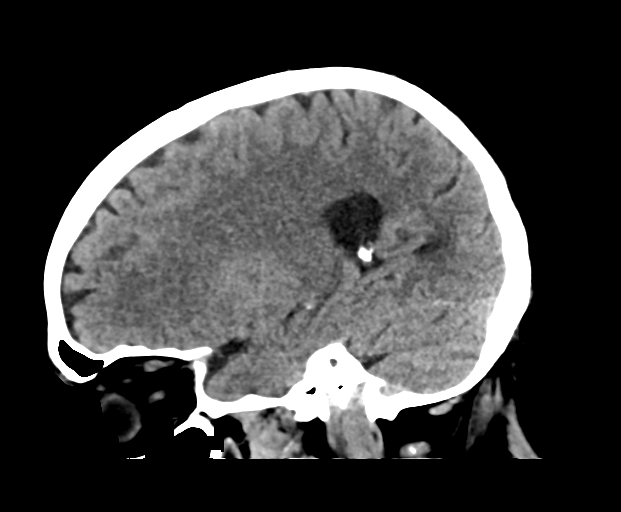
[im 34/67  brain]
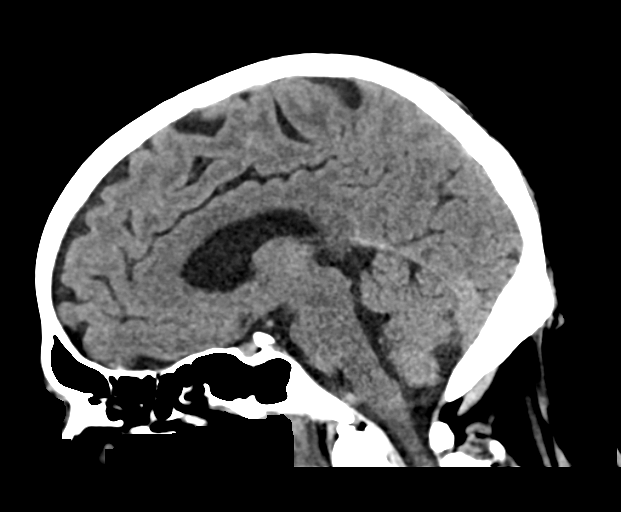
[im 45/67  brain]
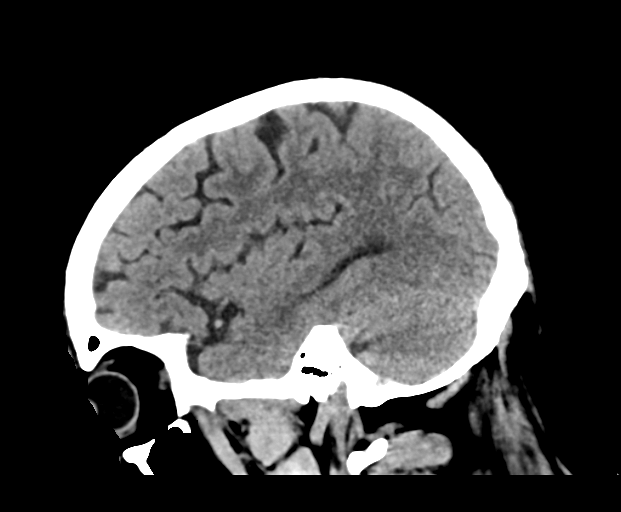

[16 of 47 positions shown; findings below may reference images not displayed]

FINDINGS: Brain: No subdural, epidural, or subarachnoid hemorrhage.
Cerebellum, brainstem, and basal cisterns are normal. The new tiny
left occipital infarct could not be seen on this study. The chronic
right parietal temporal infarct is stable. Ventricles and sulci are
stable. No other acute interval changes identified.

Vascular: Calcified atherosclerosis in the intracranial carotids.

Skull: Normal. Negative for fracture or focal lesion.

Sinuses/Orbits: Opacification of inferior right mastoid air cells
without bony erosion. Paranasal sinuses, mastoid air cells, and
middle ears are otherwise normal.

Other: None.
IMPRESSION: 1. The new left occipital lobe infarct is seen on the MRI from
yesterday is not seen on today's CT scan. The chronic right
temporoparietal infarct is stable. No other interval changes or
acute abnormalities.

## 2020-09-11 IMAGING — DX DG CHEST 1V PORT
1 series · 1 of 1 positions shown · non-contrast
Comparison: 06/21/2018.

CLINICAL DATA: Intubation.

EXAM:
PORTABLE CHEST 1 VIEW

[chest ap]
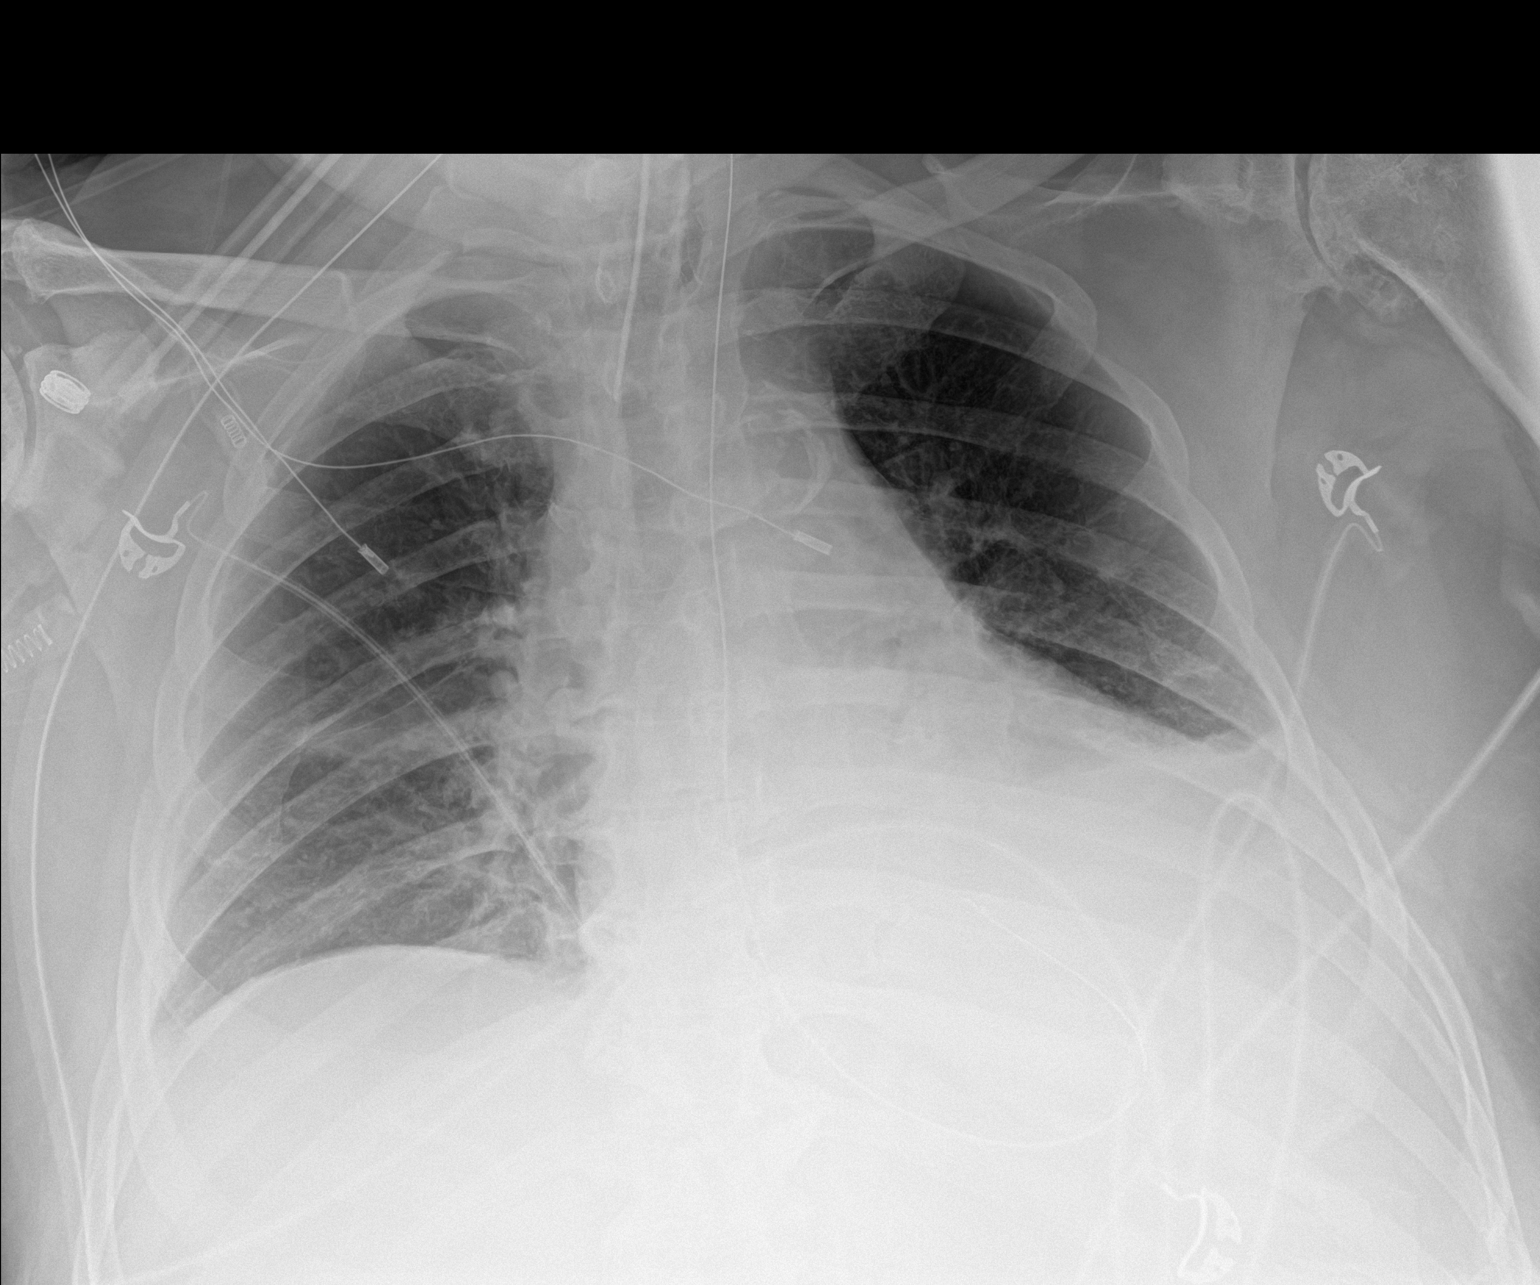

[1 of 1 positions shown; findings below may reference images not displayed]

FINDINGS: Endotracheal tube and NG tube in stable position. Cardiomegaly.
Persistent bibasilar atelectasis/infiltrates, left side greater
right. Small left pleural effusion. No pneumothorax.
IMPRESSION: 1.  Endotracheal tube and NG tube in stable position.

2. Persistent bibasilar atelectasis/infiltrates, left side greater
than right. Persistent small left pleural effusion. Similar findings
noted on prior exam.

## 2020-09-12 IMAGING — CT CT ABD-PELV W/O
2 of 4 series · 16 of 46 positions shown, 18 images · non-contrast
Comparison: 08/04/2011

CLINICAL DATA: Adrenal insufficiency.

EXAM:
CT ABDOMEN AND PELVIS WITHOUT CONTRAST
TECHNIQUE: Multidetector CT imaging of the abdomen and pelvis was performed
following the standard protocol without IV contrast.

[Series 3: a/p w/o 5mm · axial · non-contrast · 0.90mm/px · z∈[-505,-40]mm · 13 of 103 slices shown, 15 images]
[im 5/103  soft-tissue]
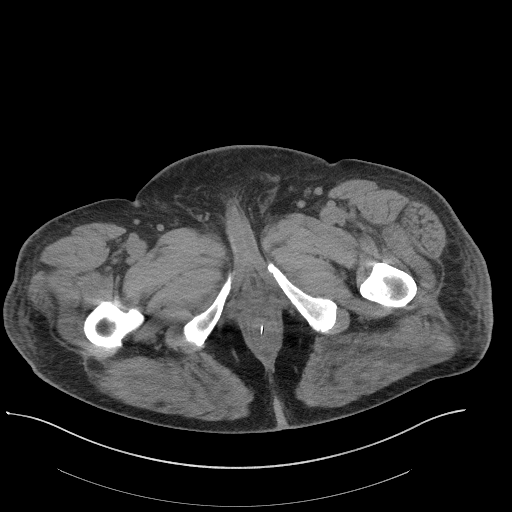
[im 5/103  bone]
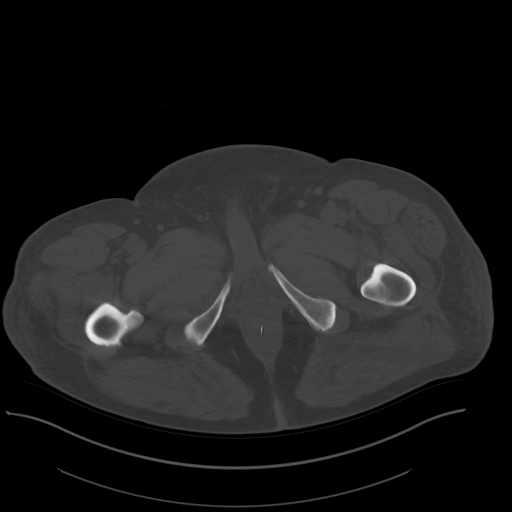
[im 14/103  soft-tissue]
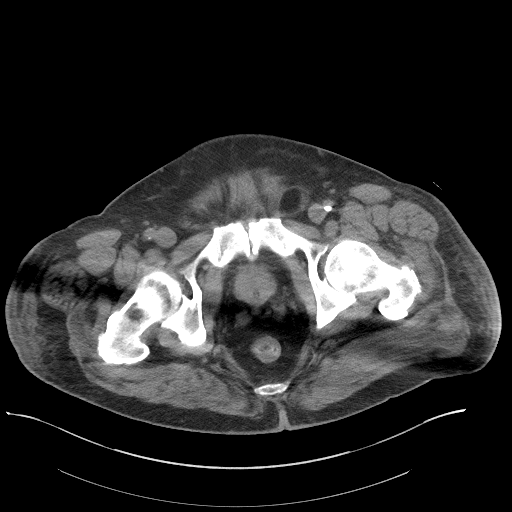
[im 24/103  soft-tissue]
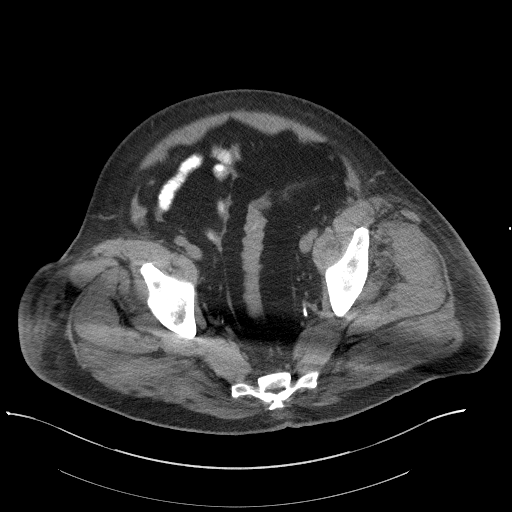
[im 28/103  soft-tissue]
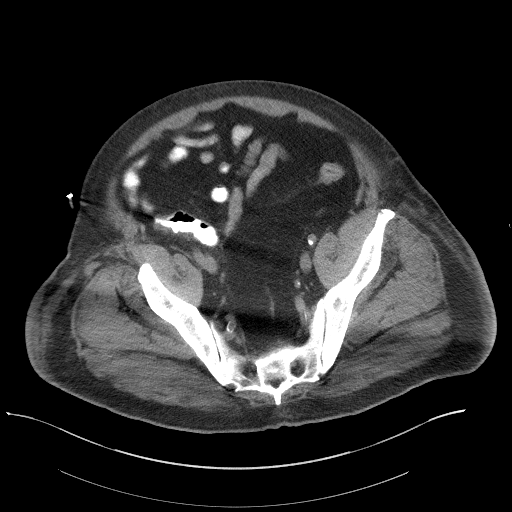
[im 38/103  soft-tissue]
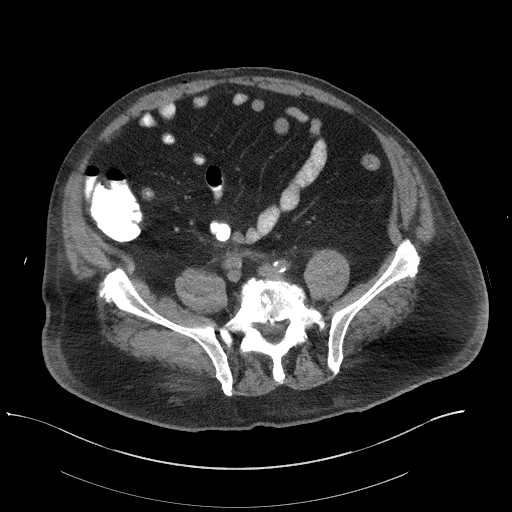
[im 42/103  soft-tissue]
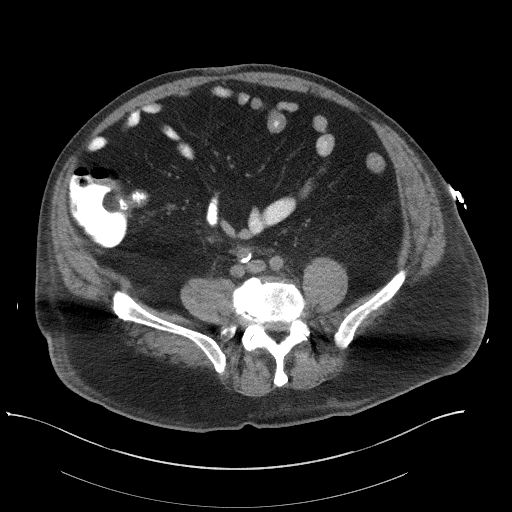
[im 52/103  soft-tissue]
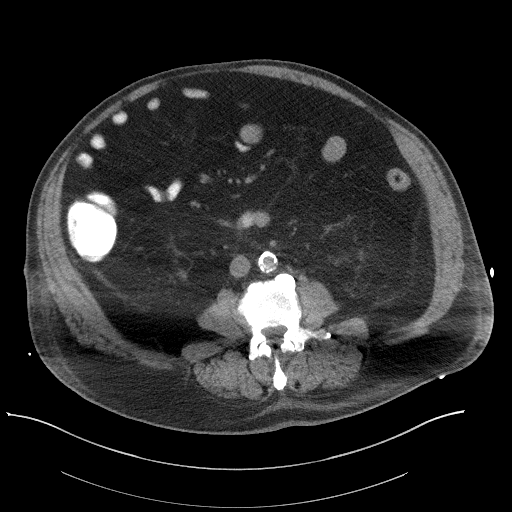
[im 61/103  soft-tissue]
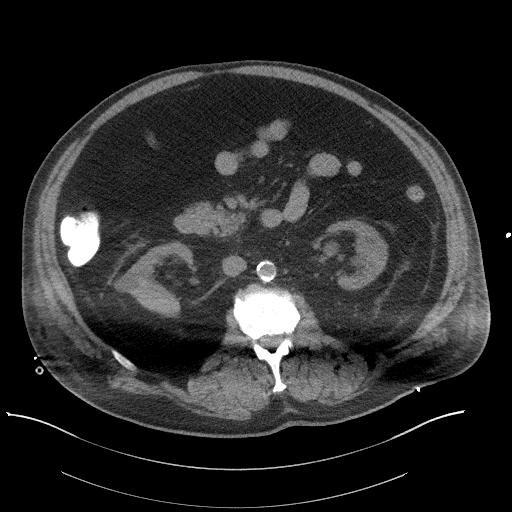
[im 65/103  soft-tissue]
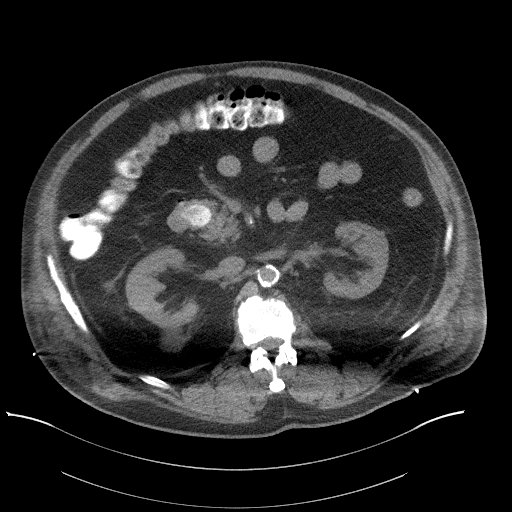
[im 65/103  bone]
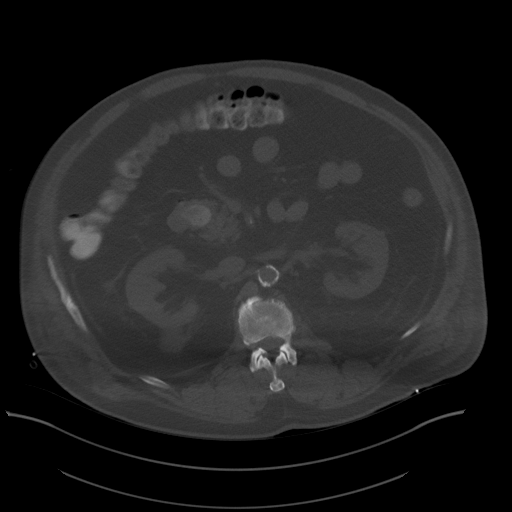
[im 75/103  soft-tissue]
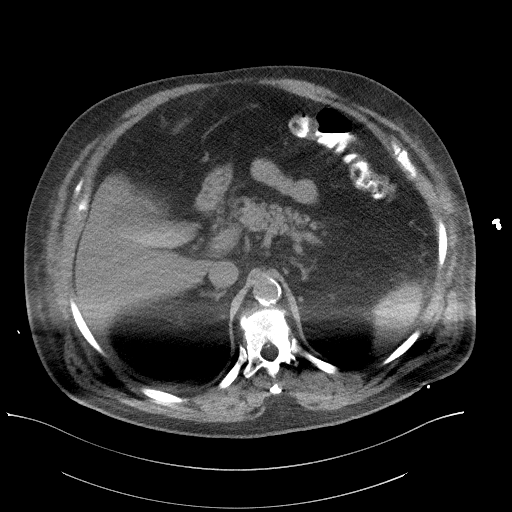
[im 79/103  soft-tissue]
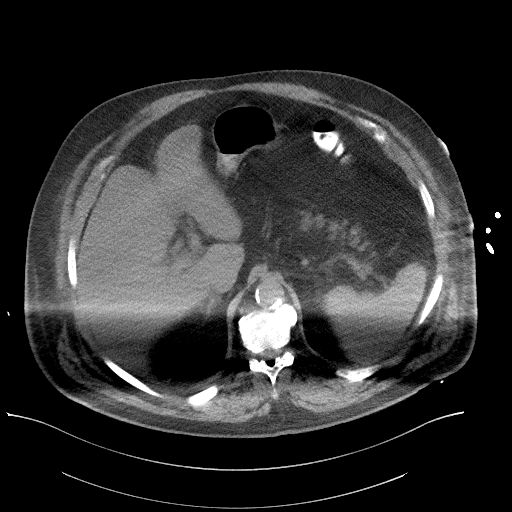
[im 89/103  soft-tissue]
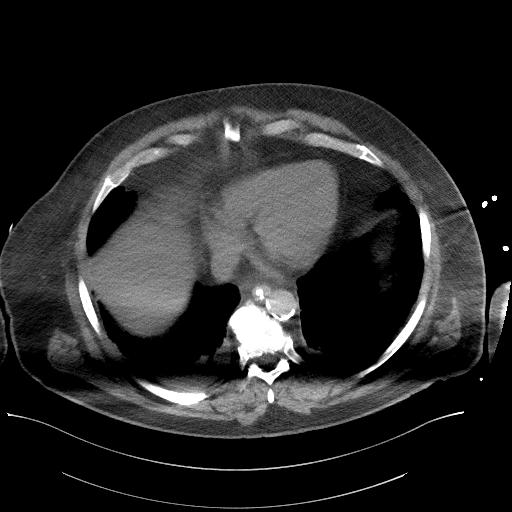
[im 98/103  soft-tissue]
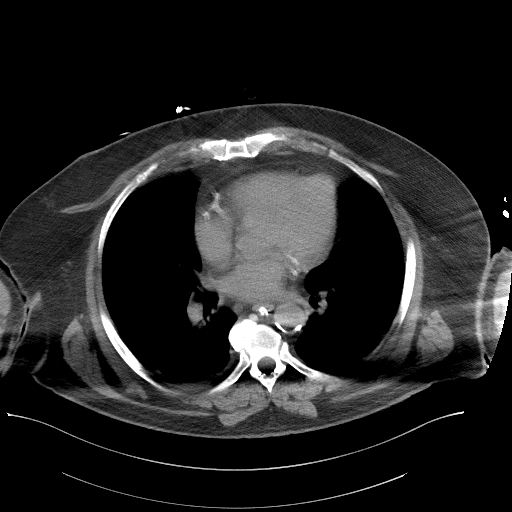

[Series 6: a/p w/o cor · coronal · non-contrast · 0.81mm/px · 3 of 164 slices shown]
[im 55/164  soft-tissue]
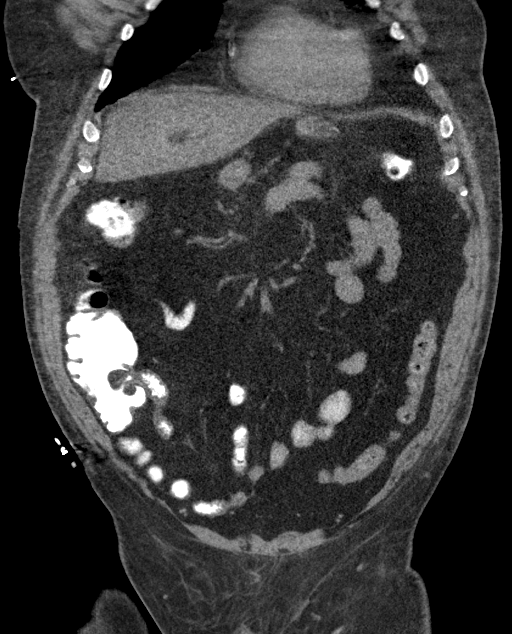
[im 73/164  soft-tissue]
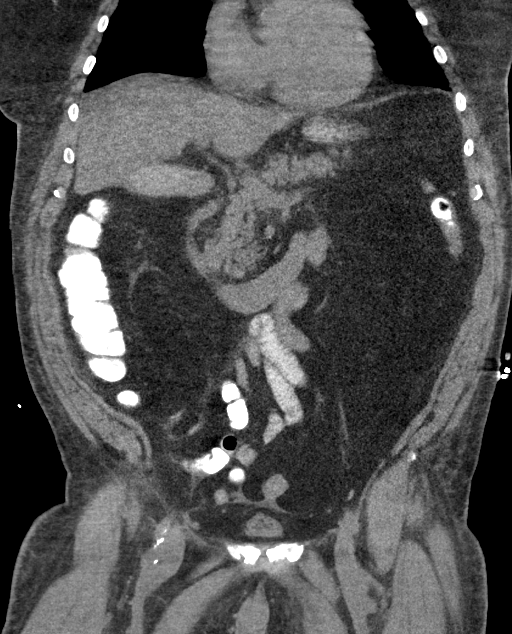
[im 91/164  soft-tissue]
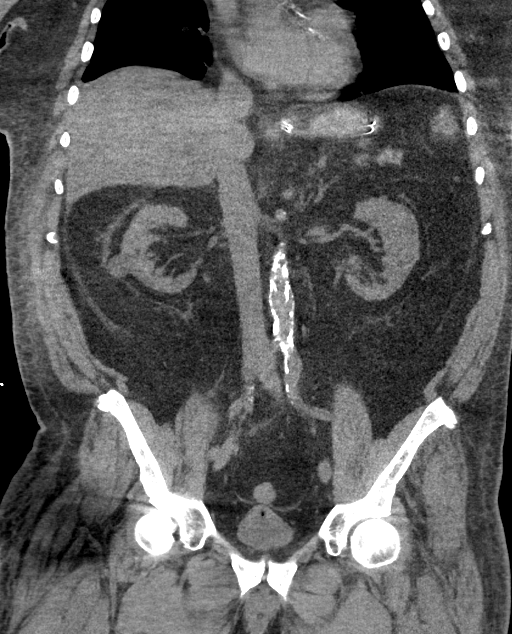

[16 of 46 positions shown; findings below may reference images not displayed]

FINDINGS: Lower chest: Atelectasis in the lung bases. Coronary artery
calcifications.

Hepatobiliary: Increased density diffusely throughout the
gallbladder may represent tiny stones or sludge. No wall thickening
or inflammatory infiltration identified. No bile duct dilatation. No
focal liver lesions.

Pancreas: Unenhanced appearance is unremarkable.

Spleen: Unenhanced appearance is unremarkable.

Adrenals/Urinary Tract: No adrenal gland nodules. Small cysts on the
right kidney. Small parapelvic cysts on the left kidney. No
hydronephrosis or hydroureter. Bladder is decompressed with a Foley
catheter.

Stomach/Bowel: Stomach, small bowel, and colon are not abnormally
distended. No wall thickening or inflammatory changes appreciated.
Duodenal diverticula arising from the second portion. Appendix is
normal.

Vascular/Lymphatic: Aortic atherosclerosis. No enlarged abdominal or
pelvic lymph nodes. Probable calcific stenosis of the distal aorta
at the bifurcation.

Reproductive: Prostate is unremarkable.

Other: No abdominal wall hernia or abnormality. No abdominopelvic
ascites. Prominent visceral adipose tissues.

Musculoskeletal: Degenerative changes in the spine. No destructive
bone lesions.
IMPRESSION: 1. No acute process demonstrated in the abdomen or pelvis. No
evidence of bowel obstruction or inflammation.
2. Increased density diffusely throughout the gallbladder may
represent tiny stones or sludge.
3. Probable calcific stenosis of the distal aorta at the
bifurcation.
4. No adrenal gland nodules.

Aortic Atherosclerosis ([id number]).

## 2020-09-18 ENCOUNTER — Other Ambulatory Visit: Payer: Self-pay

## 2020-09-18 ENCOUNTER — Observation Stay (HOSPITAL_COMMUNITY)
Admission: EM | Admit: 2020-09-18 | Discharge: 2020-09-20 | Disposition: A | Discharge status: Home / Self Care | Payer: Medicare HMO | Attending: Internal Medicine | Admitting: Internal Medicine

## 2020-09-18 ENCOUNTER — Emergency Department (HOSPITAL_COMMUNITY): Payer: Medicare HMO

## 2020-09-18 DIAGNOSIS — Z87891 Personal history of nicotine dependence: Secondary | ICD-10-CM | POA: Insufficient documentation

## 2020-09-18 DIAGNOSIS — N1831 Chronic kidney disease, stage 3a: Secondary | ICD-10-CM | POA: Diagnosis not present

## 2020-09-18 DIAGNOSIS — N179 Acute kidney failure, unspecified: Secondary | ICD-10-CM | POA: Insufficient documentation

## 2020-09-18 DIAGNOSIS — Z8673 Personal history of transient ischemic attack (TIA), and cerebral infarction without residual deficits: Secondary | ICD-10-CM | POA: Diagnosis not present

## 2020-09-18 DIAGNOSIS — Z7982 Long term (current) use of aspirin: Secondary | ICD-10-CM | POA: Diagnosis not present

## 2020-09-18 DIAGNOSIS — Z955 Presence of coronary angioplasty implant and graft: Secondary | ICD-10-CM | POA: Diagnosis not present

## 2020-09-18 DIAGNOSIS — Z79899 Other long term (current) drug therapy: Secondary | ICD-10-CM | POA: Diagnosis not present

## 2020-09-18 DIAGNOSIS — Z20822 Contact with and (suspected) exposure to covid-19: Secondary | ICD-10-CM | POA: Diagnosis not present

## 2020-09-18 DIAGNOSIS — J9811 Atelectasis: Secondary | ICD-10-CM | POA: Diagnosis not present

## 2020-09-18 DIAGNOSIS — R079 Chest pain, unspecified: Secondary | ICD-10-CM

## 2020-09-18 DIAGNOSIS — R531 Weakness: Secondary | ICD-10-CM | POA: Diagnosis not present

## 2020-09-18 DIAGNOSIS — I2510 Atherosclerotic heart disease of native coronary artery without angina pectoris: Secondary | ICD-10-CM | POA: Diagnosis not present

## 2020-09-18 DIAGNOSIS — R42 Dizziness and giddiness: Secondary | ICD-10-CM | POA: Diagnosis not present

## 2020-09-18 DIAGNOSIS — I5020 Unspecified systolic (congestive) heart failure: Secondary | ICD-10-CM | POA: Diagnosis not present

## 2020-09-18 DIAGNOSIS — Z7902 Long term (current) use of antithrombotics/antiplatelets: Secondary | ICD-10-CM | POA: Insufficient documentation

## 2020-09-18 DIAGNOSIS — Z85828 Personal history of other malignant neoplasm of skin: Secondary | ICD-10-CM | POA: Insufficient documentation

## 2020-09-18 DIAGNOSIS — R0602 Shortness of breath: Secondary | ICD-10-CM | POA: Diagnosis not present

## 2020-09-18 DIAGNOSIS — R0789 Other chest pain: Principal | ICD-10-CM | POA: Insufficient documentation

## 2020-09-18 DIAGNOSIS — I959 Hypotension, unspecified: Secondary | ICD-10-CM | POA: Diagnosis not present

## 2020-09-18 DIAGNOSIS — I700 Atherosclerosis of aorta: Secondary | ICD-10-CM | POA: Diagnosis not present

## 2020-09-18 DIAGNOSIS — I130 Hypertensive heart and chronic kidney disease with heart failure and stage 1 through stage 4 chronic kidney disease, or unspecified chronic kidney disease: Secondary | ICD-10-CM | POA: Insufficient documentation

## 2020-09-18 LAB — BASIC METABOLIC PANEL
Anion gap: 8 (ref 5–15)
BUN: 22 mg/dL (ref 8–23)
CO2: 26 mmol/L (ref 22–32)
Calcium: 9.7 mg/dL (ref 8.9–10.3)
Chloride: 101 mmol/L (ref 98–111)
Creatinine, Ser: 2.05 mg/dL — ABNORMAL HIGH (ref 0.61–1.24)
GFR, Estimated: 33 mL/min — ABNORMAL LOW (ref >60)
Glucose, Bld: 96 mg/dL (ref 70–99)
Potassium: 4.2 mmol/L (ref 3.5–5.1)
Sodium: 135 mmol/L (ref 135–145)

## 2020-09-18 LAB — CBC WITH DIFFERENTIAL/PLATELET
Abs Immature Granulocytes: 0.04 10*3/uL (ref 0.00–0.07)
Basophils Absolute: 0.1 10*3/uL (ref 0.0–0.1)
Basophils Relative: 1 %
Eosinophils Absolute: 0.3 10*3/uL (ref 0.0–0.5)
Eosinophils Relative: 3 %
HCT: 47.8 % (ref 39.0–52.0)
Hemoglobin: 15.1 g/dL (ref 13.0–17.0)
Immature Granulocytes: 0 %
Lymphocytes Relative: 10 %
Lymphs Abs: 1.2 10*3/uL (ref 0.7–4.0)
MCH: 27.9 pg (ref 26.0–34.0)
MCHC: 31.6 g/dL (ref 30.0–36.0)
MCV: 88.2 fL (ref 80.0–100.0)
Monocytes Absolute: 0.8 10*3/uL (ref 0.1–1.0)
Monocytes Relative: 6 %
Neutro Abs: 9.7 10*3/uL — ABNORMAL HIGH (ref 1.7–7.7)
Neutrophils Relative %: 80 %
Platelets: 277 10*3/uL (ref 150–400)
RBC: 5.42 MIL/uL (ref 4.22–5.81)
RDW: 13.8 % (ref 11.5–15.5)
WBC: 12.1 10*3/uL — ABNORMAL HIGH (ref 4.0–10.5)
nRBC: 0 % (ref 0.0–0.2)

## 2020-09-18 LAB — TROPONIN I (HIGH SENSITIVITY): Troponin I (High Sensitivity): 11 ng/L (ref <18)

## 2020-09-18 MED ORDER — ACETAMINOPHEN 325 MG PO TABS: 650.0000 mg | ORAL_TABLET | ORAL | Status: DC | PRN

## 2020-09-18 MED ORDER — ASPIRIN 81 MG PO CHEW
324.0000 mg | CHEWABLE_TABLET | Freq: Once | ORAL | Status: AC
  Administered 2020-09-18: 324 mg via ORAL
  Filled 2020-09-18: qty 4

## 2020-09-18 MED ORDER — ONDANSETRON HCL 4 MG/2ML IJ SOLN: 4.0000 mg | Freq: Four times a day (QID) | INTRAMUSCULAR | Status: DC | PRN

## 2020-09-18 MED ORDER — HEPARIN SODIUM (PORCINE) 5000 UNIT/ML IJ SOLN
5000.0000 [IU] | Freq: Three times a day (TID) | INTRAMUSCULAR | Status: DC
  Administered 2020-09-18 – 2020-09-20 (×5): 5000 [IU] via SUBCUTANEOUS
  Filled 2020-09-18 (×6): qty 1

## 2020-09-18 MED ORDER — NITROGLYCERIN 0.4 MG SL SUBL: 0.4000 mg | SUBLINGUAL_TABLET | SUBLINGUAL | Status: DC | PRN

## 2020-09-18 MED ORDER — ASPIRIN EC 81 MG PO TBEC
81.0000 mg | DELAYED_RELEASE_TABLET | Freq: Every day | ORAL | Status: DC
  Administered 2020-09-19 – 2020-09-20 (×2): 81 mg via ORAL
  Filled 2020-09-18 (×2): qty 1

## 2020-09-18 NOTE — ED Provider Notes (Signed)
[facility] EMERGENCY DEPARTMENT Provider Note   CSN: [id number] Arrival date & time: 09/18/20  1847     History Chief Complaint  Patient presents with  . Chest Pain    Pt c/o CP earlier with diaphoresis and SOB. Denies pain now.    Jeremy [name] is a 75 y.o. male.  Patient with 3 cardiac stents and extensive cardiac history presents chief complaint of chest pain.  He states it occurred earlier today while at rest.  He was sitting down talking with a friend when he had sudden onset mid chest pain described as aching and pressure-like sensation that caused her to break out into a sweat.  He also has shortness of breath during the episode symptoms lasted about 15 minutes and then resolved.  Did not radiate elsewhere.  Currently denies any pain and feels much better but due to the episode came into the ER.        Past Medical History:  Diagnosis Date  . Arthritis   . CAD (coronary artery disease)    a. s/p prior stenting of LAD and RCA b. 03/2019: NSTEMI and required PTCA/DESx1 to the LAD and successful scoring balloon angioplasty in the previously stented segment of LCx  . Carotid stenosis, asymptomatic, right   . Carpal tunnel syndrome    bilateral  . Coronary artery disease   . GERD (gastroesophageal reflux disease)   . History of kidney stones   . Hx of colonic polyp   . Hypercholesterolemia   . Hypertension   . Myocardial infarction ([place])    hx of  . Obesity   . Squamous cell carcinoma of skin 08/09/2016   well differentiated on right outer eye - tx p bx  . Stroke [facility])     Patient Active Problem List   Diagnosis Date Noted  . Chest pain 09/18/2020  . ACS (acute coronary syndrome) ([place]) 03/25/2019  . Unstable angina ([place]) 03/25/2019  . NSTEMI (non-ST elevated myocardial infarction) ([place]) 03/25/2019  . Cerebral thrombosis with cerebral infarction 06/28/2018  . Hypernatremia 06/27/2018  . CKD (chronic kidney disease), stage III 06/27/2018  .  Acute respiratory failure with hypoxia ([place])   . Seizure ([place]) 06/20/2018  . Acute encephalopathy 06/20/2018  . Asymptomatic carotid artery stenosis without infarction, right 12/25/2017  . Pre-operative cardiovascular examination 11/03/2017  . Carotid artery disease ([place]) 11/03/2017  . Shoulder joint pain 09/17/2013  . Numbness around mouth 12/01/2011  . Erectile dysfunction 09/05/2010  . TRIGGER FINGER, LEFT MIDDLE 04/20/2010  . ANAL FISSURE 04/03/2009  . Mixed dyslipidemia 04/01/2009  . RECTAL BLEEDING 04/01/2009  . ABDOMINAL PAIN-LLQ 04/01/2009  . ABDOMINAL PAIN, GENERALIZED 03/30/2009  . OTITIS EXTERNA, LEFT 05/07/2008  . BRONCHITIS, ACUTE 04/21/2008  . CARPAL TUNNEL SYNDROME, BILATERAL 12/25/2007  . COLD SORE 03/12/2007  . CAD (coronary artery disease) DES pLAD 03/25/19 01/01/2007  . OBESITY NOS 06/19/2006  . Essential hypertension 06/19/2006  . MYOCARDIAL INFARCTION, HX OF 06/19/2006  . COLONIC POLYPS, HX OF 06/19/2006    Past Surgical History:  Procedure Laterality Date  . CARDIAC CATHETERIZATION    . CORONARY BALLOON ANGIOPLASTY N/A 03/26/2019   Procedure: CORONARY BALLOON ANGIOPLASTY;  Surgeon: Burnell Blanks, MD;  Location: [place] CV LAB;  Service: Cardiovascular;  Laterality: N/A;  . CORONARY STENT INTERVENTION N/A 03/26/2019   Procedure: CORONARY STENT INTERVENTION;  Surgeon: Burnell Blanks, MD;  Location: [place] CV LAB;  Service: Cardiovascular;  Laterality: N/A;  . CORONARY STENT PLACEMENT  2000  x3   . CYST EXCISION     x 2  . ENDARTERECTOMY Right 12/25/2017   Procedure: ENDARTERECTOMY CAROTID RIGHT;  Surgeon: Rosetta Posner, MD;  Location: [facility];  Service: Vascular;  Laterality: Right;  . LEFT HEART CATH AND CORONARY ANGIOGRAPHY N/A 03/26/2019   Procedure: LEFT HEART CATH AND CORONARY ANGIOGRAPHY;  Surgeon: Burnell Blanks, MD;  Location: [place] CV LAB;  Service: Cardiovascular;  Laterality: N/A;  . PATCH ANGIOPLASTY Right  12/25/2017   Procedure: PATCH ANGIOPLASTY RIGHT CAROTID ARTERY;  Surgeon: Rosetta Posner, MD;  Location: [facility];  Service: Vascular;  Laterality: Right;  . TEE WITHOUT CARDIOVERSION N/A 06/29/2018   Procedure: TRANSESOPHAGEAL ECHOCARDIOGRAM (TEE);  Surgeon: Fay Records, MD;  Location: [facility] ENDOSCOPY;  Service: Cardiovascular;  Laterality: N/A;       Family History  Problem Relation Age of Onset  . Other Father 26       deceased old age  . Coronary artery disease Mother 56       deceased  . Hypertension Mother   . Other Sister 9       living and healthy  . Coronary artery disease Brother   . Skin cancer Brother   . Colon cancer Neg Hx   . Pancreatic cancer Neg Hx   . Stomach cancer Neg Hx   . Esophageal cancer Neg Hx   . Liver disease Neg Hx     Social History   Tobacco Use  . Smoking status: Former Smoker    Quit date: 05/23/1988    Years since quitting: 32.3  . Smokeless tobacco: Never Used  . Tobacco comment: smoked 2 packs per day for 20 years  Vaping Use  . Vaping Use: Never used  Substance Use Topics  . Alcohol use: No  . Drug use: No    Home Medications Prior to Admission medications   Medication Sig Start Date End Date Taking? Authorizing Provider  aspirin EC 81 MG EC tablet Take 1 tablet (81 mg total) by mouth daily. 03/28/19   Furth, Cadence H, PA-C  carvedilol (COREG) 12.5 MG tablet Take 1 tablet (12.5 mg total) by mouth 2 (two) times daily with a meal. 06/30/18   Mariel Aloe, MD  ciprofloxacin (CILOXAN) 0.3 % ophthalmic solution  11/11/19   [provider]  clopidogrel (PLAVIX) 75 MG tablet Take 1 tablet (75 mg total) by mouth daily. 08/19/19   Josue Hector, MD  dexamethasone (DECADRON) 0.1 % ophthalmic solution Instill 2 drops into the left ear twice daily for 7 days. 11/11/19   [provider]  enalapril (VASOTEC) 10 MG tablet Take 10 mg by mouth daily. 12/11/18   [provider]  fluorouracil (EFUDEX) 5 % cream Apply topically daily.  Apply qhs for 2 weeks as tolerated. 09/13/19   Sheffield, Vida Roller R, PA-C  gabapentin (NEURONTIN) 300 MG capsule Take 1 capsule (300 mg total) by mouth 3 (three) times daily. 08/21/18   Garvin Fila, MD  nitroGLYCERIN (NITROSTAT) 0.4 MG SL tablet Place 1 tablet (0.4 mg total) under the tongue every 5 (five) minutes as needed for chest pain. 03/27/19   Furth, Cadence H, PA-C  omeprazole (PRILOSEC) 40 MG capsule TAKE 1 CAPSULE BY MOUTH ONCE DAILY FOR REFLUX 02/01/19   [provider]  oxyCODONE (ROXICODONE) 15 MG immediate release tablet Take 15 mg by mouth every 6 (six) hours as needed. 10/18/19   [provider]  prochlorperazine (COMPAZINE) 10 MG tablet Take 1 tablet (  10 mg total) by mouth 2 (two) times daily as needed (Headache). 10/27/17   Charlann Lange, PA-C  rosuvastatin (CRESTOR) 40 MG tablet Take 40 mg by mouth at bedtime. 10/30/19   [provider]    Allergies    Codeine, Oxycontin [oxycodone hcl], Contrast media [iodinated diagnostic agents], and Morphine and related  Review of Systems   Review of Systems  Constitutional: Negative for fever.  HENT: Negative for ear pain and sore throat.   Eyes: Negative for pain.  Respiratory: Negative for cough.   Cardiovascular: Positive for chest pain.  Gastrointestinal: Negative for abdominal pain.  Genitourinary: Negative for flank pain.  Musculoskeletal: Negative for back pain.  Skin: Negative for color change and rash.  Neurological: Negative for syncope.  All other systems reviewed and are negative.   Physical Exam Updated Vital Signs BP 126/60 (BP Location: Right Arm)   Pulse 70   Temp 98.5 F (36.9 C) (Oral)   Resp 20   Ht 5\' 7"  (1.702 m)   Wt 95.3 kg   SpO2 94%   BMI 32.89 kg/m   Physical Exam Constitutional:      General: He is not in acute distress.    Appearance: He is well-developed.  HENT:     Head: Normocephalic.     Nose: Nose normal.  Eyes:     Extraocular Movements: Extraocular  movements intact.  Cardiovascular:     Rate and Rhythm: Normal rate.  Pulmonary:     Effort: Pulmonary effort is normal.  Skin:    Coloration: Skin is not jaundiced.  Neurological:     Mental Status: He is alert. Mental status is at baseline.     ED Results / Procedures / Treatments   Labs (all labs ordered are listed, but only abnormal results are displayed) Labs Reviewed  CBC WITH DIFFERENTIAL/PLATELET - Abnormal; Notable for the following components:      Result Value   WBC 12.1 (*)    Neutro Abs 9.7 (*)    All other components within normal limits  BASIC METABOLIC PANEL - Abnormal; Notable for the following components:   Creatinine, Ser 2.05 (*)    GFR, Estimated 33 (*)    All other components within normal limits  RESP PANEL BY RT-PCR (FLU A&B, COVID) ARPGX2  CBC  CREATININE, SERUM  TROPONIN I (HIGH SENSITIVITY)  TROPONIN I (HIGH SENSITIVITY)    EKG EKG Interpretation  Date/Time:  Friday September 18 2020 20:12:53 EDT Ventricular Rate:  73 PR Interval:  202 QRS Duration: 116 QT Interval:  374 QTC Calculation: 412 R Axis:   -6 Text Interpretation: Sinus rhythm with occasional Premature ventricular complexes Left ventricular hypertrophy with QRS widening and repolarization abnormality ( R in aVL , Cornell product ) Abnormal ECG Confirmed by Thamas Jaegers ([id number]) on 09/18/2020 8:18:08 PM   Radiology DG Chest 2 View  Result Date: 09/18/2020 CLINICAL DATA:  Chest pressure and shortness of breath. EXAM: CHEST - 2 VIEW COMPARISON:  June 26, 2019 FINDINGS: Low lung volumes are noted, likely secondary to the degree of patient inspiration. Very mild atelectasis is seen within the left lung base. There is no evidence of a pleural effusion or pneumothorax. The heart size and mediastinal contours are within normal limits. There is moderate severity calcification of the aortic arch. Degenerative changes are seen throughout the thoracic spine. IMPRESSION: Very mild left basilar  atelectasis. Electronically Signed   By: Virgina Norfolk M.D.   On: 09/18/2020 21:56    Procedures Procedures  Medications Ordered in ED Medications  aspirin chewable tablet 324 mg (has no administration in time range)  aspirin EC tablet 81 mg (has no administration in time range)  nitroGLYCERIN (NITROSTAT) SL tablet 0.4 mg (has no administration in time range)  acetaminophen (TYLENOL) tablet 650 mg (has no administration in time range)  ondansetron (ZOFRAN) injection 4 mg (has no administration in time range)  heparin injection 5,000 Units (has no administration in time range)    ED Course  I have reviewed the triage vital signs and the nursing notes.  Pertinent labs & imaging results that were available during my care of the patient were reviewed by me and considered in my medical decision making (see chart for details).    MDM Rules/Calculators/A&P                          Initial troponin is negative.  EKG shows some ST depressions perhaps no ST elevations noted, otherwise sinus rhythm with normal rate.  Initial troponin is negative.  Given extensive history Case discussed with cardiology.  Plan to bring in for observation for chest pain.  Final Clinical Impression(s) / ED Diagnoses Final diagnoses:  Midsternal chest pain    Rx / DC Orders ED Discharge Orders    None       Luna Fuse, MD 09/18/20 2311

## 2020-09-18 NOTE — H&P (Incomplete)
History and Physical  Jeremy [name] Jeremy [name] DOB: 04-21-1946 DOA: 09/18/2020  Referring physician: Dr. Almyra Free, [place] PCP: Celene Squibb, MD  Outpatient Specialists: Cardiology. Patient coming from: Home.  Chief Complaint: Chest pressure.  HPI: Jeremy [name] is a 75 y.o. male with medical history significant for coronary artery disease status post PCI with stent placement in 2020, essential hypertension, hyperlipidemia, prior CVA with right CEA who presented to [facility] ED from a nursing facility where he was visiting a friend.  While he was there patient was noted to be diaphoretic.  He was not feeling well had some chest pressure centrally located.  Nonradiating, lasting about 15 minutes.  Nurse at the skilled nursing facility called EMS.  States he has been feeling a little rundown for the last couple of days.  Associated with generalized weakness.    He was brought to the ED for further evaluation.  At the time of his arrival to the ED his symptoms had resolved.  Work-up in the ED was nonrevealing.  Due to extensive cardiac history EDP consulted cardiology and requested admission for observation and to rule out ACS.  ED Course: Afebrile.  BP 128/62, pulse 72, respiration rate 19, O2 saturation 95% on room air.  Lab studies remarkable for troponin 11.  BUN 22, creatinine 2.05, GFR 33.  WBC 12.1, neutrophil count 9.7.  Review of Systems: Review of systems as noted in the HPI. All other systems reviewed and are negative.   Past Medical History:  Diagnosis Date  . Arthritis   . CAD (coronary artery disease)    a. s/p prior stenting of LAD and RCA b. 03/2019: NSTEMI and required PTCA/DESx1 to the LAD and successful scoring balloon angioplasty in the previously stented segment of LCx  . Carotid stenosis, asymptomatic, right   . Carpal tunnel syndrome    bilateral  . Coronary artery disease   . GERD (gastroesophageal reflux disease)   . History of kidney stones   . Hx of colonic polyp   .  Hypercholesterolemia   . Hypertension   . Myocardial infarction ([place])    hx of  . Obesity   . Squamous cell carcinoma of skin 08/09/2016   well differentiated on right outer eye - tx p bx  . Stroke [facility])    Past Surgical History:  Procedure Laterality Date  . CARDIAC CATHETERIZATION    . CORONARY BALLOON ANGIOPLASTY N/A 03/26/2019   Procedure: CORONARY BALLOON ANGIOPLASTY;  Surgeon: Burnell Blanks, MD;  Location: [place] CV LAB;  Service: Cardiovascular;  Laterality: N/A;  . CORONARY STENT INTERVENTION N/A 03/26/2019   Procedure: CORONARY STENT INTERVENTION;  Surgeon: Burnell Blanks, MD;  Location: [place] CV LAB;  Service: Cardiovascular;  Laterality: N/A;  . CORONARY STENT PLACEMENT  2000   x3   . CYST EXCISION     x 2  . ENDARTERECTOMY Right 12/25/2017   Procedure: ENDARTERECTOMY CAROTID RIGHT;  Surgeon: Rosetta Posner, MD;  Location: [place];  Service: Vascular;  Laterality: Right;  . LEFT HEART CATH AND CORONARY ANGIOGRAPHY N/A 03/26/2019   Procedure: LEFT HEART CATH AND CORONARY ANGIOGRAPHY;  Surgeon: Burnell Blanks, MD;  Location: [place] CV LAB;  Service: Cardiovascular;  Laterality: N/A;  . PATCH ANGIOPLASTY Right 12/25/2017   Procedure: PATCH ANGIOPLASTY RIGHT CAROTID ARTERY;  Surgeon: Rosetta Posner, MD;  Location: [place];  Service: Vascular;  Laterality: Right;  . TEE WITHOUT CARDIOVERSION N/A 06/29/2018   Procedure: TRANSESOPHAGEAL ECHOCARDIOGRAM (TEE);  Surgeon:  Fay Records, MD;  Location: [facility] ENDOSCOPY;  Service: Cardiovascular;  Laterality: N/A;    Social History:  reports that he quit smoking about 32 years ago. He has never used smokeless tobacco. He reports that he does not drink alcohol and does not use drugs.   Allergies  Allergen Reactions  . Codeine Anaphylaxis  . Oxycontin [Oxycodone Hcl] Shortness Of Breath and Itching    Through IV- has since had oral oxycodone without issue  . Contrast Media [Iodinated Diagnostic Agents]      Patient got very nauseated. Gave 4mg  Zofran. Possibly pre medicate with Zofran   . Morphine And Related Itching    Family History  Problem Relation Age of Onset  . Other Father 36       deceased old age  . Coronary artery disease Mother 13       deceased  . Hypertension Mother   . Other Sister 31       living and healthy  . Coronary artery disease Brother   . Skin cancer Brother   . Colon cancer Neg Hx   . Pancreatic cancer Neg Hx   . Stomach cancer Neg Hx   . Esophageal cancer Neg Hx   . Liver disease Neg Hx     ***  Prior to Admission medications   Medication Sig Start Date End Date Taking? Authorizing Provider  aspirin EC 81 MG EC tablet Take 1 tablet (81 mg total) by mouth daily. 03/28/19   Furth, Cadence H, PA-C  carvedilol (COREG) 12.5 MG tablet Take 1 tablet (12.5 mg total) by mouth 2 (two) times daily with a meal. 06/30/18   Mariel Aloe, MD  ciprofloxacin (CILOXAN) 0.3 % ophthalmic solution  11/11/19   [provider]  clopidogrel (PLAVIX) 75 MG tablet Take 1 tablet (75 mg total) by mouth daily. 08/19/19   Josue Hector, MD  dexamethasone (DECADRON) 0.1 % ophthalmic solution Instill 2 drops into the left ear twice daily for 7 days. 11/11/19   [provider]  enalapril (VASOTEC) 10 MG tablet Take 10 mg by mouth daily. 12/11/18   [provider]  fluorouracil (EFUDEX) 5 % cream Apply topically daily. Apply qhs for 2 weeks as tolerated. 09/13/19   Sheffield, Vida Roller R, PA-C  gabapentin (NEURONTIN) 300 MG capsule Take 1 capsule (300 mg total) by mouth 3 (three) times daily. 08/21/18   Garvin Fila, MD  nitroGLYCERIN (NITROSTAT) 0.4 MG SL tablet Place 1 tablet (0.4 mg total) under the tongue every 5 (five) minutes as needed for chest pain. 03/27/19   Furth, Cadence H, PA-C  omeprazole (PRILOSEC) 40 MG capsule TAKE 1 CAPSULE BY MOUTH ONCE DAILY FOR REFLUX 02/01/19   [provider]  oxyCODONE (ROXICODONE) 15 MG immediate release tablet Take 15 mg  by mouth every 6 (six) hours as needed. 10/18/19   [provider]  prochlorperazine (COMPAZINE) 10 MG tablet Take 1 tablet (10 mg total) by mouth 2 (two) times daily as needed (Headache). 10/27/17   Charlann Lange, PA-C  rosuvastatin (CRESTOR) 40 MG tablet Take 40 mg by mouth at bedtime. 10/30/19   [provider]    Physical Exam: BP 126/60 (BP Location: Right Arm)   Pulse 70   Temp 98.5 F (36.9 C) (Oral)   Resp 20   Ht 5\' 7"  (1.702 m)   Wt 95.3 kg   SpO2 94%   BMI 32.89 kg/m   . General: 75 y.o. year-old male well developed well nourished  in no acute distress.  Alert and oriented x3. . Cardiovascular: Regular rate and rhythm with no rubs or gallops.  No thyromegaly or JVD noted.  No lower extremity edema. 2/4 pulses in all 4 extremities. Marland Kitchen Respiratory: Clear to auscultation with no wheezes or rales. Good inspiratory effort. . Abdomen: Soft nontender nondistended with normal bowel sounds x4 quadrants. . Muskuloskeletal: No cyanosis, clubbing or edema noted bilaterally . Neuro: CN II-XII intact, strength, sensation, reflexes . Skin: No ulcerative lesions noted or rashes . Psychiatry: Judgement and insight appear normal. Mood is appropriate for condition and setting          Labs on Admission:  Basic Metabolic Panel: Recent Labs  Lab 09/18/20 2050  NA 135  K 4.2  CL 101  CO2 26  GLUCOSE 96  BUN 22  CREATININE 2.05*  CALCIUM 9.7   Liver Function Tests: No results for input(s): AST, ALT, ALKPHOS, BILITOT, PROT, ALBUMIN in the last 168 hours. No results for input(s): LIPASE, AMYLASE in the last 168 hours. No results for input(s): AMMONIA in the last 168 hours. CBC: Recent Labs  Lab 09/18/20 2050  WBC 12.1*  NEUTROABS 9.7*  HGB 15.1  HCT 47.8  MCV 88.2  PLT 277   Cardiac Enzymes: No results for input(s): CKTOTAL, CKMB, CKMBINDEX, TROPONINI in the last 168 hours.  BNP (last 3 results) No results for input(s): BNP in the last 8760 hours.  ProBNP  (last 3 results) No results for input(s): PROBNP in the last 8760 hours.  CBG: No results for input(s): GLUCAP in the last 168 hours.  Radiological Exams on Admission: DG Chest 2 View  Result Date: 09/18/2020 CLINICAL DATA:  Chest pressure and shortness of breath. EXAM: CHEST - 2 VIEW COMPARISON:  June 26, 2019 FINDINGS: Low lung volumes are noted, likely secondary to the degree of patient inspiration. Very mild atelectasis is seen within the left lung base. There is no evidence of a pleural effusion or pneumothorax. The heart size and mediastinal contours are within normal limits. There is moderate severity calcification of the aortic arch. Degenerative changes are seen throughout the thoracic spine. IMPRESSION: Very mild left basilar atelectasis. Electronically Signed   By: Aram Candela M.D.   On: 09/18/2020 21:56    EKG: I independently viewed the EKG done and my findings are as followed: Sinus rhythm with occasional PVCs, rate 73, nonspecific ST-T changes, QTC 412.  Assessment/Plan Present on Admission: . Chest pain  Active Problems:   Chest pain  Chest pain rule out ACS. While visiting a friend, wanting to be became diaphoretic with chest pressure, nonradiating lasting about 15 minutes.  Did not take anything to alleviate the pain. Symptomatology resolved by the time he arrived to the ED. Troponin 11, cycle x2 Obtain limited 2D echo Cardiology consulted by EDP. Monitor on telemetry overnight.  Coronary artery disease status post PCI with stent placement in 2020 Follows with cardiology outpatient Resume home regimen.  History of CVA He is currently on aspirin, Plavix, Crestor Fall precautions.  Generalized weakness Obtain UA and culture Start antibiotics empirically if positive.     DVT prophylaxis: ***   Code Status: ***   Family Communication: ***   Disposition Plan: ***   Consults called: ***   Admission status: ***    Status is:  Observation  {Observation:23811}  Dispo: The patient is from: {From:23814}              Anticipated d/c is to: {To:23815}  Patient currently {Medically stable:23817}   Difficult to place patient {Yes/No:25151}       Kayleen Memos MD [facility] Pager [phone]  If 7PM-7AM, please contact night-coverage [email or web address] Password [facility]  09/18/2020, 10:53 PM

## 2020-09-18 NOTE — H&P (Signed)
History and Physical  Jeremy [name] EXB:[id number] DOB: [birth year] DOA: 09/18/2020  Referring physician: Dr. Almyra Free, [place] PCP: Celene Squibb, MD  Outpatient Specialists: Cardiology. Patient coming from: Home.  Chief Complaint: Chest pressure.  HPI: Jeremy [name] is a 75 y.o. male with medical history significant for coronary artery disease status post PCI with stent placement in 2020 on DAPT, essential hypertension, prior CVA with right CEA who presented to [facility] ED from a nursing facility where he was visiting a friend.  While he was there, while watching TV, patient was noted to be diaphoretic.  Reports some chest pressure centrally located.  Nonradiating, lasting about 15 minutes and resolving spontaneously.  Nurse at the skilled nursing facility called EMS.  States he has been feeling a little rundown for the last couple of days.  Associated with generalized weakness.  He was brought to the ED for further evaluation.  At the time of his arrival to the ED his symptoms had resolved.  Due to extensive cardiac history EDP consulted cardiology.  TRH, hospitalist team, was asked to admit for observation and to rule out ACS.  ED Course:  Afebrile.  BP 128/62, pulse 72, respiration rate 19, O2 saturation 95% on room air.  Lab studies remarkable for troponin 11.  BUN 22, creatinine 2.05, GFR 33.  WBC 12.1, neutrophil count 9.7.  Review of Systems: Review of systems as noted in the HPI. All other systems reviewed and are negative.   Past Medical History:  Diagnosis Date  . Arthritis   . CAD (coronary artery disease)    a. s/p prior stenting of LAD and RCA b. 03/2019: NSTEMI and required PTCA/DESx1 to the LAD and successful scoring balloon angioplasty in the previously stented segment of LCx  . Carotid stenosis, asymptomatic, right   . Carpal tunnel syndrome    bilateral  . Coronary artery disease   . GERD (gastroesophageal reflux disease)   . History of kidney stones   . Hx of colonic polyp   .  Hypercholesterolemia   . Hypertension   . Myocardial infarction ([place])    hx of  . Obesity   . Squamous cell carcinoma of skin 08/09/2016   well differentiated on right outer eye - tx p bx  . Stroke [facility])    Past Surgical History:  Procedure Laterality Date  . CARDIAC CATHETERIZATION    . CORONARY BALLOON ANGIOPLASTY N/A 03/26/2019   Procedure: CORONARY BALLOON ANGIOPLASTY;  Surgeon: Burnell Blanks, MD;  Location: [place] CV LAB;  Service: Cardiovascular;  Laterality: N/A;  . CORONARY STENT INTERVENTION N/A 03/26/2019   Procedure: CORONARY STENT INTERVENTION;  Surgeon: Burnell Blanks, MD;  Location: [place] CV [facility];  Service: Cardiovascular;  Laterality: N/A;  . CORONARY STENT PLACEMENT  2000   x3   . CYST EXCISION     x 2  . ENDARTERECTOMY Right 12/25/2017   Procedure: ENDARTERECTOMY CAROTID RIGHT;  Surgeon: Rosetta Posner, MD;  Location: [place];  Service: Vascular;  Laterality: Right;  . LEFT HEART CATH AND CORONARY ANGIOGRAPHY N/A 03/26/2019   Procedure: LEFT HEART CATH AND CORONARY ANGIOGRAPHY;  Surgeon: Burnell Blanks, MD;  Location: [place] CV LAB;  Service: Cardiovascular;  Laterality: N/A;  . PATCH ANGIOPLASTY Right 12/25/2017   Procedure: PATCH ANGIOPLASTY RIGHT CAROTID ARTERY;  Surgeon: Rosetta Posner, MD;  Location: [place];  Service: Vascular;  Laterality: Right;  . TEE WITHOUT CARDIOVERSION N/A 06/29/2018   Procedure: TRANSESOPHAGEAL ECHOCARDIOGRAM (TEE);  Surgeon: Harrington Challenger,  Carmin Muskrat, MD;  Location: [facility] ENDOSCOPY;  Service: Cardiovascular;  Laterality: N/A;    Social History:  reports that he quit smoking about 32 years ago. He has never used smokeless tobacco. He reports that he does not drink alcohol and does not use drugs.   Allergies  Allergen Reactions  . Codeine Anaphylaxis  . Oxycontin [Oxycodone Hcl] Shortness Of Breath and Itching    Through IV- has since had oral oxycodone without issue  . Contrast Media [Iodinated Diagnostic Agents]      Patient got very nauseated. Gave 4mg  Zofran. Possibly pre medicate with Zofran   . Morphine And Related Itching    Family History  Problem Relation Age of Onset  . Other Father 24       deceased old age  . Coronary artery disease Mother 36       deceased  . Hypertension Mother   . Other Sister 15       living and healthy  . Coronary artery disease Brother   . Skin cancer Brother   . Colon cancer Neg Hx   . Pancreatic cancer Neg Hx   . Stomach cancer Neg Hx   . Esophageal cancer Neg Hx   . Liver disease Neg Hx       Prior to Admission medications   Medication Sig Start Date End Date Taking? Authorizing Provider  aspirin EC 81 MG EC tablet Take 1 tablet (81 mg total) by mouth daily. 03/28/19   Furth, Cadence H, PA-C  carvedilol (COREG) 12.5 MG tablet Take 1 tablet (12.5 mg total) by mouth 2 (two) times daily with a meal. 06/30/18   Mariel Aloe, MD  ciprofloxacin (CILOXAN) 0.3 % ophthalmic solution  11/11/19   [provider]  clopidogrel (PLAVIX) 75 MG tablet Take 1 tablet (75 mg total) by mouth daily. 08/19/19   Josue Hector, MD  dexamethasone (DECADRON) 0.1 % ophthalmic solution Instill 2 drops into the left ear twice daily for 7 days. 11/11/19   [provider]  enalapril (VASOTEC) 10 MG tablet Take 10 mg by mouth daily. 12/11/18   [provider]  fluorouracil (EFUDEX) 5 % cream Apply topically daily. Apply qhs for 2 weeks as tolerated. 09/13/19   Sheffield, Vida Roller R, PA-C  gabapentin (NEURONTIN) 300 MG capsule Take 1 capsule (300 mg total) by mouth 3 (three) times daily. 08/21/18   Garvin Fila, MD  nitroGLYCERIN (NITROSTAT) 0.4 MG SL tablet Place 1 tablet (0.4 mg total) under the tongue every 5 (five) minutes as needed for chest pain. 03/27/19   Furth, Cadence H, PA-C  omeprazole (PRILOSEC) 40 MG capsule TAKE 1 CAPSULE BY MOUTH ONCE DAILY FOR REFLUX 02/01/19   [provider]  oxyCODONE (ROXICODONE) 15 MG immediate release tablet Take 15 mg by  mouth every 6 (six) hours as needed. 10/18/19   [provider]  prochlorperazine (COMPAZINE) 10 MG tablet Take 1 tablet (10 mg total) by mouth 2 (two) times daily as needed (Headache). 10/27/17   Charlann Lange, PA-C  rosuvastatin (CRESTOR) 40 MG tablet Take 40 mg by mouth at bedtime. 10/30/19   [provider]    Physical Exam: BP 126/60 (BP Location: Right Arm)   Pulse 70   Temp 98.5 F (36.9 C) (Oral)   Resp 20   Ht 5\' 7"  (1.702 m)   Wt 95.3 kg   SpO2 94%   BMI 32.89 kg/m   . General: 75 y.o. year-old male well developed well nourished in  no acute distress.  Alert and oriented x3. . Cardiovascular: Regular rate and rhythm with no rubs or gallops.  No thyromegaly or JVD noted.  No lower extremity edema. 2/4 pulses in all 4 extremities. Marland Kitchen Respiratory: Clear to auscultation with no wheezes or rales. Good inspiratory effort. . Abdomen: Soft nontender nondistended with normal bowel sounds x4 quadrants. . Muskuloskeletal: No cyanosis, clubbing or edema noted bilaterally . Neuro: CN II-XII intact, strength, sensation, reflexes . Skin: No ulcerative lesions noted or rashes . Psychiatry: Judgement and insight appear normal. Mood is appropriate for condition and setting          Labs on Admission:  Basic Metabolic Panel: Recent Labs  Lab 09/18/20 2050  NA 135  K 4.2  CL 101  CO2 26  GLUCOSE 96  BUN 22  CREATININE 2.05*  CALCIUM 9.7   Liver Function Tests: No results for input(s): AST, ALT, ALKPHOS, BILITOT, PROT, ALBUMIN in the last 168 hours. No results for input(s): LIPASE, AMYLASE in the last 168 hours. No results for input(s): AMMONIA in the last 168 hours. CBC: Recent Labs  Lab 09/18/20 2050  WBC 12.1*  NEUTROABS 9.7*  HGB 15.1  HCT 47.8  MCV 88.2  PLT 277   Cardiac Enzymes: No results for input(s): CKTOTAL, CKMB, CKMBINDEX, TROPONINI in the last 168 hours.  BNP (last 3 results) No results for input(s): BNP in the last 8760 hours.  ProBNP  (last 3 results) No results for input(s): PROBNP in the last 8760 hours.  CBG: No results for input(s): GLUCAP in the last 168 hours.  Radiological Exams on Admission: DG Chest 2 View  Result Date: 09/18/2020 CLINICAL DATA:  Chest pressure and shortness of breath. EXAM: CHEST - 2 VIEW COMPARISON:  June 26, 2019 FINDINGS: Low lung volumes are noted, likely secondary to the degree of patient inspiration. Very mild atelectasis is seen within the left lung base. There is no evidence of a pleural effusion or pneumothorax. The heart size and mediastinal contours are within normal limits. There is moderate severity calcification of the aortic arch. Degenerative changes are seen throughout the thoracic spine. IMPRESSION: Very mild left basilar atelectasis. Electronically Signed   By: Virgina Norfolk M.D.   On: 09/18/2020 21:56    EKG: I independently viewed the EKG done and my findings are as followed: Sinus rhythm with occasional PVCs, rate 73, nonspecific ST-T changes, QTC 412.  Assessment/Plan Present on Admission: . Chest pain  Active Problems:   Chest pain  Chest pain rule out ACS. While visiting a friend, became diaphoretic with centrally located chest pressure at rest while watching TV, nonradiating, lasting about 15 minutes.  Did not take anything to alleviate it. Symptomatology resolved by the time he arrived to the ED. Received a full dose of aspirin in the ED. Troponin 11, cycle x2 Obtain limited 2D echo Cardiology consulted by EDP. Monitor on telemetry overnight.  AKI, likely prerenal in the setting of dehydration. Hypovolemic on exam. Baseline creatinine appears to be 1.0 with GFR of greater than 60. Presented with creatinine of 2.05 with GFR of 33 Gentle IV fluid hydration normal saline at 30 cc/h x 12 hours, judicious IV fluid due to history of HFREF 35-40% (06/22/18). Closely monitor urine output Repeat renal panel in the morning.  Generalized weakness Obtain UA and  culture Start antibiotics empirically if UA is positive for pyuria.  Orthostatic vital signs in the morning. PT OT to assess. Fall precautions.  HFREF 35-40% in 2020 Closely monitor volume  status while on gentle IV fluid hydration normal saline at 30 cc/h x12 hours. Restart home cardiac medications. Follow 2D echo ordered Start strict I's and O's and daily weight Management per cardiology  Coronary artery disease status post PCI with stent placement in 2020 Follows with cardiology outpatient Resume home regimen.  Carotid artery stenosis status post CEA On DAPT and statin, continue  History of CVA He is currently on aspirin, Plavix, Crestor Fall precautions.  Leukocytosis, could be reactive however need to rule out active infective process Follow UA No evidence of lobular infiltrates on chest x-ray to suggest pneumonia, personally reviewed. Repeat CBC in the morning  Polyneuropathy Resume home regimen   DVT prophylaxis: Subcu heparin 3 times daily  Code Status: Full code as stated by the patient himself.  Family Communication: His wife at bedside.  All questions answered to the best of my ability.  Disposition Plan: Admit to telemetry cardiac.  Consults called: Cardiology consulted by EDP.  Admission status: Observation status.   Status is: Observation    Dispo: The patient is from: Home.              Anticipated d/c is to: Home.              Patient currently not stable for discharge due to ongoing management of chest pain rule out ACS, AKI.   Difficult to place patient, not applicable.       Kayleen Memos MD [facility] Pager [phone]  If 7PM-7AM, please contact night-coverage [email or web address] Password [facility]  09/18/2020, 10:53 PM

## 2020-09-18 NOTE — ED Notes (Signed)
Hospitalist at bedside with patient.

## 2020-09-18 NOTE — ED Triage Notes (Signed)
Emergency Medicine Provider Triage Evaluation Note  Jeremy [name] , a 75 y.o. male  was evaluated in triage.  Pt complains of an episode if diaphoresis and near syncope. Reports he had some associated tightness in his chest when this occurred. Has hx of 3 stents. Reports some associated sob. Denies associated nv.  Review of Systems  Positive: Chest tightness, diaphoresis, near syncope, sob Negative: nv  Physical Exam  BP (!) 97/46 (BP Location: Right Arm)   Pulse 75   Temp 98.5 F (36.9 C) (Oral)   Resp 16   Ht 5\' 7"  (1.702 m)   Wt 95.3 kg   SpO2 94%   BMI 32.89 kg/m  Gen:   Awake, no distress   HEENT:  Atraumatic  Resp:  Normal effort  Cardiac:  Normal rate  Abd:   Nondistended, nontender  MSK:   Moves extremities without difficulty  Neuro:  Speech clear   Medical Decision Making  Medically screening exam initiated at 8:48 PM.  Appropriate orders placed.  Jeremy [name] was informed that the remainder of the evaluation will be completed by another provider, this initial triage assessment does not replace that evaluation, and the importance of remaining in the ED until their evaluation is complete.  Clinical Impression   MSE was initiated and I personally evaluated the patient and placed orders (if any) at  8:48 PM on September 18, 2020.  The patient appears stable so that the remainder of the MSE may be completed by another provider.    Rodney Booze, [place] 09/18/20 2049

## 2020-09-19 ENCOUNTER — Observation Stay (HOSPITAL_BASED_OUTPATIENT_CLINIC_OR_DEPARTMENT_OTHER): Payer: Medicare HMO

## 2020-09-19 ENCOUNTER — Encounter (HOSPITAL_COMMUNITY): Payer: Self-pay | Admitting: Internal Medicine

## 2020-09-19 DIAGNOSIS — R079 Chest pain, unspecified: Secondary | ICD-10-CM | POA: Diagnosis not present

## 2020-09-19 DIAGNOSIS — Z79899 Other long term (current) drug therapy: Secondary | ICD-10-CM | POA: Diagnosis not present

## 2020-09-19 DIAGNOSIS — Z7982 Long term (current) use of aspirin: Secondary | ICD-10-CM | POA: Diagnosis not present

## 2020-09-19 DIAGNOSIS — I5020 Unspecified systolic (congestive) heart failure: Secondary | ICD-10-CM | POA: Diagnosis not present

## 2020-09-19 DIAGNOSIS — N1831 Chronic kidney disease, stage 3a: Secondary | ICD-10-CM | POA: Diagnosis not present

## 2020-09-19 DIAGNOSIS — R0789 Other chest pain: Secondary | ICD-10-CM | POA: Diagnosis not present

## 2020-09-19 DIAGNOSIS — Z20822 Contact with and (suspected) exposure to covid-19: Secondary | ICD-10-CM | POA: Diagnosis not present

## 2020-09-19 DIAGNOSIS — Z955 Presence of coronary angioplasty implant and graft: Secondary | ICD-10-CM | POA: Diagnosis not present

## 2020-09-19 DIAGNOSIS — I2510 Atherosclerotic heart disease of native coronary artery without angina pectoris: Secondary | ICD-10-CM | POA: Diagnosis not present

## 2020-09-19 DIAGNOSIS — Z87891 Personal history of nicotine dependence: Secondary | ICD-10-CM | POA: Diagnosis not present

## 2020-09-19 LAB — CBC
HCT: 40.7 % (ref 39.0–52.0)
Hemoglobin: 13.4 g/dL (ref 13.0–17.0)
MCH: 28.5 pg (ref 26.0–34.0)
MCHC: 32.9 g/dL (ref 30.0–36.0)
MCV: 86.4 fL (ref 80.0–100.0)
Platelets: 232 10*3/uL (ref 150–400)
RBC: 4.71 MIL/uL (ref 4.22–5.81)
RDW: 13.7 % (ref 11.5–15.5)
WBC: 6.1 10*3/uL (ref 4.0–10.5)
nRBC: 0 % (ref 0.0–0.2)

## 2020-09-19 LAB — URINALYSIS, ROUTINE W REFLEX MICROSCOPIC
Bilirubin Urine: NEGATIVE
Glucose, UA: NEGATIVE mg/dL
Hgb urine dipstick: NEGATIVE
Ketones, ur: NEGATIVE mg/dL
Leukocytes,Ua: NEGATIVE
Nitrite: NEGATIVE
Protein, ur: NEGATIVE mg/dL
Specific Gravity, Urine: 1.024 (ref 1.005–1.030)
pH: 5 (ref 5.0–8.0)

## 2020-09-19 LAB — ECHOCARDIOGRAM LIMITED
Area-P 1/2: 2.73 cm2
Height: 67 in
S' Lateral: 2.3 cm
Weight: 3289.6 oz

## 2020-09-19 LAB — RESP PANEL BY RT-PCR (FLU A&B, COVID) ARPGX2
Influenza A by PCR: NEGATIVE
Influenza B by PCR: NEGATIVE
SARS Coronavirus 2 by RT PCR: NEGATIVE

## 2020-09-19 LAB — TROPONIN I (HIGH SENSITIVITY)
Troponin I (High Sensitivity): 10 ng/L (ref <18)
Troponin I (High Sensitivity): 9 ng/L (ref <18)

## 2020-09-19 LAB — CREATININE, SERUM
Creatinine, Ser: 1.98 mg/dL — ABNORMAL HIGH (ref 0.61–1.24)
GFR, Estimated: 35 mL/min — ABNORMAL LOW (ref >60)

## 2020-09-19 MED ORDER — LORATADINE 10 MG PO TABS: 10.0000 mg | ORAL_TABLET | Freq: Every evening | ORAL | Status: DC | PRN

## 2020-09-19 MED ORDER — LACTATED RINGERS IV SOLN: INTRAVENOUS | Status: DC

## 2020-09-19 MED ORDER — SODIUM CHLORIDE 0.9 % IV SOLN: INTRAVENOUS | Status: AC

## 2020-09-19 MED ORDER — ENALAPRIL MALEATE 5 MG PO TABS
10.0000 mg | ORAL_TABLET | Freq: Every day | ORAL | Status: DC
  Administered 2020-09-19: 10 mg via ORAL
  Filled 2020-09-19: qty 2

## 2020-09-19 MED ORDER — PERFLUTREN LIPID MICROSPHERE
1.0000 mL | INTRAVENOUS | Status: AC | PRN
  Administered 2020-09-19: 3 mL via INTRAVENOUS
  Filled 2020-09-19: qty 10

## 2020-09-19 MED ORDER — CARVEDILOL 12.5 MG PO TABS
12.5000 mg | ORAL_TABLET | Freq: Two times a day (BID) | ORAL | Status: DC
  Administered 2020-09-19 – 2020-09-20 (×3): 12.5 mg via ORAL
  Filled 2020-09-19 (×3): qty 1

## 2020-09-19 MED ORDER — PANTOPRAZOLE SODIUM 40 MG PO TBEC
40.0000 mg | DELAYED_RELEASE_TABLET | Freq: Every day | ORAL | Status: DC
  Administered 2020-09-19 – 2020-09-20 (×2): 40 mg via ORAL
  Filled 2020-09-19 (×2): qty 1

## 2020-09-19 MED ORDER — ROSUVASTATIN CALCIUM 20 MG PO TABS
40.0000 mg | ORAL_TABLET | Freq: Every day | ORAL | Status: DC
  Administered 2020-09-19: 40 mg via ORAL
  Filled 2020-09-19 (×2): qty 2

## 2020-09-19 MED ORDER — GABAPENTIN 300 MG PO CAPS
300.0000 mg | ORAL_CAPSULE | Freq: Three times a day (TID) | ORAL | Status: DC
  Administered 2020-09-19 – 2020-09-20 (×4): 300 mg via ORAL
  Filled 2020-09-19 (×4): qty 1

## 2020-09-19 MED ORDER — DOCUSATE SODIUM 100 MG PO CAPS
100.0000 mg | ORAL_CAPSULE | Freq: Two times a day (BID) | ORAL | Status: DC | PRN
  Administered 2020-09-19: 100 mg via ORAL
  Filled 2020-09-19: qty 1

## 2020-09-19 MED ORDER — ERYTHROMYCIN 5 MG/GM OP OINT
1.0000 "application " | TOPICAL_OINTMENT | Freq: Four times a day (QID) | OPHTHALMIC | Status: DC
  Administered 2020-09-19 (×4): 1 via OPHTHALMIC
  Filled 2020-09-19: qty 3.5

## 2020-09-19 MED ORDER — CLOPIDOGREL BISULFATE 75 MG PO TABS
75.0000 mg | ORAL_TABLET | Freq: Every day | ORAL | Status: DC
  Administered 2020-09-19 – 2020-09-20 (×2): 75 mg via ORAL
  Filled 2020-09-19 (×2): qty 1

## 2020-09-19 MED ORDER — LEVOCETIRIZINE DIHYDROCHLORIDE 5 MG PO TABS: 5.0000 mg | ORAL_TABLET | Freq: Every evening | ORAL | Status: DC | PRN

## 2020-09-19 NOTE — Evaluation (Signed)
Occupational Therapy Evaluation Patient Details Name: Jeremy [name] MRN: [id number] DOB: 10-18-1945 Today's Date: 09/19/2020    History of Present Illness 75 y.o. male with medical history significant for coronary artery disease status post PCI with stent placement in 2020 on DAPT, essential hypertension, prior CVA with right CEA who presented to [facility] ED with c/o diaphoresis and chest pressure.   Clinical Impression   Pt admitted with the above diagnoses. At baseline, pt is independent with basic ADLs and IADLs (drives, grocery shops, mows the lawn, community ambulator). Pt walked laps in the room navigating obstacles with ease, VSS. Pt appears to have had no change in ability to complete ADLs. Will sign off for OT services.    Follow Up Recommendations  No OT follow up    Equipment Recommendations  None recommended by OT    Recommendations for Other Services       Precautions / Restrictions Restrictions Weight Bearing Restrictions: No      Mobility Bed Mobility Overal bed mobility: Independent                  Transfers Overall transfer level: Independent                    Balance Overall balance assessment: No apparent balance deficits (not formally assessed)                                         ADL either performed or assessed with clinical judgement   ADL Overall ADL's : Independent;At baseline                                       General ADL Comments: Pt walked laps in his hospital room navigating obstacles with ease, VSS. Pt reports he is very active at baseline.     Vision         Perception     Praxis      Pertinent Vitals/Pain Pain Assessment: No/denies pain     Hand Dominance Right   Extremity/Trunk Assessment Upper Extremity Assessment Upper Extremity Assessment: Overall WFL for tasks assessed   Lower Extremity Assessment Lower Extremity Assessment: Overall WFL for tasks assessed    Cervical / Trunk Assessment Cervical / Trunk Assessment: Normal   Communication Communication Communication: No difficulties   Cognition Arousal/Alertness: Awake/alert Behavior During Therapy: WFL for tasks assessed/performed Overall Cognitive Status: Within Functional Limits for tasks assessed                                     General Comments       Exercises     Shoulder Instructions      Home Living Family/patient expects to be discharged to:: Private residence Living Arrangements: Spouse/significant other Available Help at Discharge: Family Type of Home: House Home Access: Level entry     Home Layout: Two level;Able to live on main level with bedroom/bathroom     Bathroom Shower/Tub: Teacher, early years/pre: Standard     Home Equipment: Environmental consultant - 2 wheels;Shower seat          Prior Functioning/Environment Level of Independence: Independent        Comments: active at baseline, mows the lawn,  drives, community ambulator        OT Problem List:        OT Treatment/Interventions:      OT Goals(Current goals can be found in the care plan section)    OT Frequency:     Barriers to D/C:            Co-evaluation              AM-PAC OT "6 Clicks" Daily Activity     Outcome Measure Help from another person eating meals?: None Help from another person taking care of personal grooming?: None Help from another person toileting, which includes using toliet, bedpan, or urinal?: None Help from another person bathing (including washing, rinsing, drying)?: None Help from another person to put on and taking off regular upper body clothing?: None Help from another person to put on and taking off regular lower body clothing?: None 6 Click Score: 24   End of Session Nurse Communication: Mobility status  Activity Tolerance: Patient tolerated treatment well Patient left: in bed;with call bell/phone within reach  OT Visit  Diagnosis: Pain                Time: 1052-1106 OT Time Calculation (min): 14 min Charges:  OT General Charges $OT Visit: 1 Visit OT Evaluation $OT Eval Low Complexity: [place], OT Acute Rehabilitation Services Pager: [phone] Office: [phone]   Hortencia Pilar 09/19/2020, 11:28 AM

## 2020-09-19 NOTE — Progress Notes (Signed)
PT Cancellation Note  Patient Details Name: Jeremy [name] MRN: [id number] DOB: 16-Mar-1946   Cancelled Treatment:    Reason Eval/Treat Not Completed: PT screened, no needs identified, will sign off at this time. After discussion with OT, pt was able to complete all transfers and ambulation in the room without evidence of instability, change in vitals, or need for assist. Pt reports he is at his baseline level of mobility. PT will sign off at this time, thank you for the consult, feel free to re-consult if change in status.   Hardie Pulley, DPT   Acute Rehabilitation Department Pager #: [phone]   Otho Bellows 09/19/2020, 11:35 AM

## 2020-09-19 NOTE — Progress Notes (Signed)
  Echocardiogram 2D Echocardiogram has been performed with [facility].  Jeremy [name] 09/19/2020, 9:14 AM

## 2020-09-19 NOTE — Progress Notes (Signed)
PROGRESS NOTE                                                                                                                                                                                                             Patient Demographics:    Jeremy [name], is a 75 y.o. male, DOB - 04-08-1946, WUJ:[id number]  Outpatient Primary MD for the patient is Celene Squibb, MD    LOS - 0  Admit date - 09/18/2020    Chief Complaint  Patient presents with  . Chest Pain    Pt c/o CP earlier with diaphoresis and SOB. Denies pain now.       Brief Narrative (HPI from H&P) -  Jeremy [name] is a 75 y.o. male with medical history significant for coronary artery disease status post PCI with stent placement in 2020 on DAPT, essential hypertension, prior CVA with right CEA who presented to [facility] ED from a nursing facility where he was visiting a friend, he developed chest pain and diaphoresis came to the where he was found to have AKI, he was admitted for AKI/metabolic chest pain in the setting of history of CAD   Subjective:    [name] today has, No headache, No chest pain, No abdominal pain - No Nausea, No new weakness tingling or numbness, no SOB.   Assessment  & Plan :     1. Chest pain in a patient with history of CAD-currently chest pain-free, EKG nonacute, troponin negative, he is on medical treatment with aspirin, plavix, statin and beta-blocker, cardiology following likely outpatient work-up. TTE pending.  2.  AKI on CKD 3A.  Baseline creatinine around 1.6 hydrate gently with IV fluids avoid nephrotoxins and monitor.  3. HTN -on beta-blocker, avoiding ACE/ARB due to AKI.  Monitor.  4.  Dyslipidemia.  On statin continue.  5.  GERD.  On PPI.      Condition -  Guarded  Family Communication  : wife [name] [phone] on  09/20/2018  Code Status :  Full  Consults  :  Cards  PUD Prophylaxis : PPI   Procedures  :        TTE -       Disposition Plan  :     Dispo:  Patient From: Home  Planned Disposition: Home  Medically stable for discharge: No  DVT Prophylaxis  :    heparin injection 5,000 Units Start: 09/18/20 2300 SCDs Start: 09/18/20 2252     Lab Results  Component Value Date   PLT 232 09/19/2020    Diet :  Diet Order            Diet NPO time specified Except for: Sips with Meds, Ice Chips  Diet effective now                  Inpatient Medications  Scheduled Meds: . aspirin EC  81 mg Oral Daily  . carvedilol  12.5 mg Oral BID WC  . clopidogrel  75 mg Oral Daily  . erythromycin  1 application Both Eyes QID  . gabapentin  300 mg Oral TID  . heparin  5,000 Units Subcutaneous Q8H  . pantoprazole  40 mg Oral Daily  . rosuvastatin  40 mg Oral QHS   Continuous Infusions: . sodium chloride 30 mL/hr at 09/19/20 0229   PRN Meds:.acetaminophen, docusate sodium, loratadine, nitroGLYCERIN, ondansetron (ZOFRAN) IV  Antibiotics  :    Anti-infectives (From admission, onward)   None       Time Spent in minutes  30   Lala Lund M.D on 09/19/2020 at 11:24 AM  To page go to [email or web address]   [facility] -  Office  [phone]   See all Orders from today for further details    Objective:   Vitals:   09/19/20 0627 09/19/20 0628 09/19/20 0807 09/19/20 0842  BP: 133/68  129/70   Pulse: 73  74 66  Resp: 16  18   Temp: 98.2 F (36.8 C)  98.2 F (36.8 C)   TempSrc: Oral  Oral   SpO2: 97%  96%   Weight:  93.3 kg    Height:        Wt Readings from Last 3 Encounters:  09/19/20 93.3 kg  04/14/20 97.5 kg  11/12/19 93.4 kg     Intake/Output Summary (Last 24 hours) at 09/19/2020 1124 Last data filed at 09/19/2020 7858 Gross per 24 hour  Intake --  Output 300 ml  Net -300 ml     Physical Exam  Awake Alert, No new F.N deficits, Normal affect [place].AT,PERRAL Supple Neck,No JVD, No cervical lymphadenopathy appriciated.  Symmetrical Chest wall movement,  Good air movement bilaterally, CTAB RRR,No Gallops,Rubs or new Murmurs, No Parasternal Heave +ve B.Sounds, Abd Soft, No tenderness, No organomegaly appriciated, No rebound - guarding or rigidity. No Cyanosis, Clubbing or edema, No new Rash or bruise      Data Review:    CBC Recent Labs  Lab 09/18/20 2050 09/19/20 0215  WBC 12.1* 6.1  HGB 15.1 13.4  HCT 47.8 40.7  PLT 277 232  MCV 88.2 86.4  MCH 27.9 28.5  MCHC 31.6 32.9  RDW 13.8 13.7  LYMPHSABS 1.2  --   MONOABS 0.8  --   EOSABS 0.3  --   BASOSABS 0.1  --     Recent Labs  Lab 09/18/20 2050 09/19/20 0015  NA 135  --   K 4.2  --   CL 101  --   CO2 26  --   GLUCOSE 96  --   BUN 22  --   CREATININE 2.05* 1.98*  CALCIUM 9.7  --     ------------------------------------------------------------------------------------------------------------------ No results for input(s): CHOL, HDL, LDLCALC, TRIG, CHOLHDL, LDLDIRECT in the last 72 hours.  Lab Results  Component Value Date   HGBA1C 5.3 06/23/2018   ------------------------------------------------------------------------------------------------------------------ No results  for input(s): TSH, T4TOTAL, T3FREE, THYROIDAB in the last 72 hours.  Invalid input(s): FREET3  Cardiac Enzymes No results for input(s): CKMB, TROPONINI, MYOGLOBIN in the last 168 hours.  Invalid input(s): CK ------------------------------------------------------------------------------------------------------------------    Component Value Date/Time   BNP 105.0 (H) 01/30/2019 0455    Micro Results Recent Results (from the past 240 hour(s))  Resp Panel by RT-PCR (Flu A&B, Covid) Nasopharyngeal Swab     Status: None   Collection Time: 09/18/20  1:00 AM   Specimen: Nasopharyngeal Swab; Nasopharyngeal(NP) swabs in vial transport medium  Result Value Ref Range Status   SARS Coronavirus 2 by RT PCR NEGATIVE NEGATIVE Final    Comment: (NOTE) SARS-CoV-2 target nucleic acids are NOT  DETECTED.  The SARS-CoV-2 RNA is generally detectable in upper respiratory specimens during the acute phase of infection. The lowest concentration of SARS-CoV-2 viral copies this assay can detect is 138 copies/mL. A negative result does not preclude SARS-Cov-2 infection and should not be used as the sole basis for treatment or other patient management decisions. A negative result may occur with  improper specimen collection/handling, submission of specimen other than nasopharyngeal swab, presence of viral mutation(s) within the areas targeted by this assay, and inadequate number of viral copies(<138 copies/mL). A negative result must be combined with clinical observations, patient history, and epidemiological information. The expected result is Negative.  Fact Sheet for Patients:  EntrepreneurPulse.com.au  Fact Sheet for Healthcare Providers:  IncredibleEmployment.be  This test is no t yet approved or cleared by the [place] FDA and  has been authorized for detection and/or diagnosis of SARS-CoV-2 by FDA under an Emergency Use Authorization (EUA). This EUA will remain  in effect (meaning this test can be used) for the duration of the COVID-19 declaration under Section 564(b)(1) of the Act, 21 U.S.C.section 360bbb-3(b)(1), unless the authorization is terminated  or revoked sooner.       Influenza A by PCR NEGATIVE NEGATIVE Final   Influenza B by PCR NEGATIVE NEGATIVE Final    Comment: (NOTE) The Xpert Xpress SARS-CoV-2/FLU/RSV plus assay is intended as an aid in the diagnosis of influenza from Nasopharyngeal swab specimens and should not be used as a sole basis for treatment. Nasal washings and aspirates are unacceptable for Xpert Xpress SARS-CoV-2/FLU/RSV testing.  Fact Sheet for Patients: EntrepreneurPulse.com.au  Fact Sheet for Healthcare Providers: IncredibleEmployment.be  This test is not yet  approved or cleared by the [place] FDA and has been authorized for detection and/or diagnosis of SARS-CoV-2 by FDA under an Emergency Use Authorization (EUA). This EUA will remain in effect (meaning this test can be used) for the duration of the COVID-19 declaration under Section 564(b)(1) of the Act, 21 U.S.C. section 360bbb-3(b)(1), unless the authorization is terminated or revoked.  Performed at [facility], [place] [address] [place], [place] [address]     Radiology Reports DG Chest 2 View  Result Date: 09/18/2020 CLINICAL DATA:  Chest pressure and shortness of breath. EXAM: CHEST - 2 VIEW COMPARISON:  June 26, 2019 FINDINGS: Low lung volumes are noted, likely secondary to the degree of patient inspiration. Very mild atelectasis is seen within the left lung base. There is no evidence of a pleural effusion or pneumothorax. The heart size and mediastinal contours are within normal limits. There is moderate severity calcification of the aortic arch. Degenerative changes are seen throughout the thoracic spine. IMPRESSION: Very mild left basilar atelectasis. Electronically Signed   By: Virgina Norfolk M.D.   On: 09/18/2020 21:56

## 2020-09-19 NOTE — Consult Note (Addendum)
Cardiology Consultation:   Patient ID: BOSTIN VITIELLO MRN: [id number]; DOB: 1946/02/27  Admit date: 09/18/2020 Date of Consult: 09/19/2020  PCP:  Celene Squibb, [place]  Cardiologist:  Jenkins Rouge, MD  Advanced Practice Provider:  No care team member to display Electrophysiologist:  None    Patient Profile:   Jeremy [name] is a 75 y.o. male with PMH of CAD s/p multiple PCIs of LAD, RCA, and Cx ( MI in 1992 with stent placed by Dr Lia Foyer and last PCI to LAD on 03/26/2019 by Dr. Angelena Form), HTN, HLD, CVA, right carotid stenosis s/p right carotid endarterectomy 12/25/17, who is being seen today for the evaluation of chest pain at the request of Dr. Nevada Crane.   History of Present Illness:   Jeremy [name] currently follows Dr Johnsie Cancel outpatient primarily, previously followed by Dr Julianne Rice in [place], was a patient of Dr Lia Foyer back in 2012.   He had initial MI in 1992 with stent placed by Dr Lia Foyer, no cath report from 2012 can be found. He had pre-op evaluation on 02/2019, where Lexiscan Myoview 03/20/19 was done and was normal. He went to [facility] ED for worsening chest pain 03/25/2019 and was found to have NSTEMI with EKG showing incomplete LBBB and troponin 5345. He underwent subsequent left heart cath on 03/26/19 which showed 50% stenosis of prox RCA, 60% stenosis of dist RCA, 50% stenosis of RPDA, severe restenosis of mid Cx stented segment which treated with successful scoring balloon angioplasty, 99% stenosis of prox LAD which was treated with a successful PTCA/DES, 70% stenosis of dist Cx, 60% stenosis 2ng Mrg, 80% stenosis of 2nd diag, LV systolic function and diastolic pressure normal, EF 50-55%. He was recommended DAPT with ASA and Plavix for 1 yr and BB and statin therapy.   He was doing fairly well after PCI, last followed up with Dr Johnsie Cancel in the office on 04/14/20, was recommended continue lifelong DAPT, BB, statin. He was felt stable from CAD standpoint  to have shoulder surgery and advised to hold plavix 5 days before surgery.   He came to the [facility] ER on 09/18/20 for c/o chest pain. He was visiting a friend at SNF, he become suddently diaphoretic while watching TV. He reports cental sternum chest pressure, non-radiating, lasted about 15 minutes and resolved spontaneously. He has been feeling a little rundown with generalized weakness over the past few days. Staff at [facility] called EMS and sent the patient here for evaluation.    Diagnostic workup here showed mild leukocytosis WBC 12.1 which is normalized to 6.1 today. BMP with Cr 2.05 >1.98, GFR 33-35, otherwise unremarkable. Hs Trop 11 >10 >9. UA suggest dehydration, unremarkable for infection. CXR showed very mild left basilar atelectasis. EKG at admission showed sinus rhythm, ventricular rate of 73 bpm, first degree AV block, old anterior infarct, non-specific intraventricular delay, non-specific ST-T changes. He was admitted to hospital medicine for ruling out ACS. He has remained hemodynamically stable. He received full dose ASA at ED, Echo is pending report, cardiology is consulted for further input.   During encounter, patient states his pain has resolved since he got to the hospital. He has not been having any chest pain lately, and yesterday's episode was the first time. He endorsed some dizziness and diaphoresis yesterday during the episode. He states he has been doing well otherwise, denied poor PO intake, is compliant with all his medication. He denied any current chest pain, SOB, orthopnea, heart palpitation,  PND, leg swelling. He is urinating well, denied any nausea, vomiting, diarrhea. He states he feels bad some days since his stroke, denied any residual deficit or weakness.    Past Medical History:  Diagnosis Date  . Arthritis   . CAD (coronary artery disease)    a. s/p prior stenting of LAD and RCA b. 03/2019: NSTEMI and required PTCA/DESx1 to the LAD and successful scoring balloon  angioplasty in the previously stented segment of LCx  . Carotid stenosis, asymptomatic, right   . Carpal tunnel syndrome    bilateral  . Coronary artery disease   . GERD (gastroesophageal reflux disease)   . History of kidney stones   . Hx of colonic polyp   . Hypercholesterolemia   . Hypertension   . Myocardial infarction ([place])    hx of  . Obesity   . Squamous cell carcinoma of skin 08/09/2016   well differentiated on right outer eye - tx p bx  . Stroke [facility])     Past Surgical History:  Procedure Laterality Date  . CARDIAC CATHETERIZATION    . CORONARY BALLOON ANGIOPLASTY N/A 03/26/2019   Procedure: CORONARY BALLOON ANGIOPLASTY;  Surgeon: Burnell Blanks, MD;  Location: [place] CV LAB;  Service: Cardiovascular;  Laterality: N/A;  . CORONARY STENT INTERVENTION N/A 03/26/2019   Procedure: CORONARY STENT INTERVENTION;  Surgeon: Burnell Blanks, MD;  Location: [place] CV LAB;  Service: Cardiovascular;  Laterality: N/A;  . CORONARY STENT PLACEMENT  2000   x3   . CYST EXCISION     x 2  . ENDARTERECTOMY Right 12/25/2017   Procedure: ENDARTERECTOMY CAROTID RIGHT;  Surgeon: Rosetta Posner, MD;  Location: [place];  Service: Vascular;  Laterality: Right;  . LEFT HEART CATH AND CORONARY ANGIOGRAPHY N/A 03/26/2019   Procedure: LEFT HEART CATH AND CORONARY ANGIOGRAPHY;  Surgeon: Burnell Blanks, MD;  Location: [place] CV LAB;  Service: Cardiovascular;  Laterality: N/A;  . PATCH ANGIOPLASTY Right 12/25/2017   Procedure: PATCH ANGIOPLASTY RIGHT CAROTID ARTERY;  Surgeon: Rosetta Posner, MD;  Location: [place];  Service: Vascular;  Laterality: Right;  . TEE WITHOUT CARDIOVERSION N/A 06/29/2018   Procedure: TRANSESOPHAGEAL ECHOCARDIOGRAM (TEE);  Surgeon: Fay Records, MD;  Location: [facility] ENDOSCOPY;  Service: Cardiovascular;  Laterality: N/A;     Home Medications:  Prior to Admission medications   Medication Sig Start Date End Date Taking? Authorizing Provider  aspirin EC  81 MG EC tablet Take 1 tablet (81 mg total) by mouth daily. 03/28/19  Yes Furth, Cadence H, PA-C  carvedilol (COREG) 12.5 MG tablet Take 1 tablet (12.5 mg total) by mouth 2 (two) times daily with a meal. 06/30/18  Yes Mariel Aloe, MD  clopidogrel (PLAVIX) 75 MG tablet Take 1 tablet (75 mg total) by mouth daily. 08/19/19  Yes Josue Hector, MD  enalapril (VASOTEC) 10 MG tablet Take 10 mg by mouth daily. 12/11/18  Yes [provider]  erythromycin ophthalmic ointment Place 1 application into both eyes 4 (four) times daily. 08/25/20  Yes [provider]  gabapentin (NEURONTIN) 300 MG capsule Take 1 capsule (300 mg total) by mouth 3 (three) times daily. 08/21/18  Yes Garvin Fila, MD  levocetirizine (XYZAL) 5 MG tablet Take 5 mg by mouth at bedtime as needed for allergies.   Yes [provider]  nitroGLYCERIN (NITROSTAT) 0.4 MG SL tablet Place 1 tablet (0.4 mg total) under the tongue every 5 (five) minutes as needed for chest pain. 03/27/19  Yes Furth, Cadence H, PA-C  omeprazole (PRILOSEC) 40 MG capsule Take 40 mg by mouth daily. 02/01/19  Yes [provider]  oxyCODONE (ROXICODONE) 15 MG immediate release tablet Take 15 mg by mouth every 6 (six) hours as needed for pain. 10/18/19  Yes [provider]  Propylene Glycol (SYSTANE COMPLETE OP) Apply 1 drop to eye in the morning and at bedtime.   Yes [provider]  rosuvastatin (CRESTOR) 40 MG tablet Take 40 mg by mouth at bedtime. 10/30/19  Yes [provider]  traMADol (ULTRAM) 50 MG tablet Take 50 mg by mouth every 6 (six) hours as needed for moderate pain. 08/25/20  Yes [provider]  prochlorperazine (COMPAZINE) 10 MG tablet Take 1 tablet (10 mg total) by mouth 2 (two) times daily as needed (Headache). Patient not taking: No sig reported 10/27/17   Charlann Lange, PA-C    Inpatient Medications: Scheduled Meds: . aspirin EC  81 mg Oral Daily  . carvedilol  12.5 mg Oral BID WC  .  clopidogrel  75 mg Oral Daily  . enalapril  10 mg Oral Daily  . erythromycin  1 application Both Eyes QID  . gabapentin  300 mg Oral TID  . heparin  5,000 Units Subcutaneous Q8H  . pantoprazole  40 mg Oral Daily  . rosuvastatin  40 mg Oral QHS   Continuous Infusions: . sodium chloride 30 mL/hr at 09/19/20 0229   PRN Meds: acetaminophen, docusate sodium, loratadine, nitroGLYCERIN, ondansetron (ZOFRAN) IV, perflutren lipid microspheres (DEFINITY) IV suspension  Allergies:    Allergies  Allergen Reactions  . Codeine Anaphylaxis  . Oxycontin [Oxycodone Hcl] Shortness Of Breath and Itching    Through IV- has since had oral oxycodone without issue  . Contrast Media [Iodinated Diagnostic Agents]     Patient got very nauseated. Gave 4mg  Zofran. Possibly pre medicate with Zofran   . Morphine And Related Itching    Social History:   Social History   Socioeconomic History  . Marital status: Divorced    Spouse name: Not on file  . Number of children: Not on file  . Years of education: Not on file  . Highest education level: Not on file  Occupational History  . Occupation: retired    Comment: welder  Tobacco Use  . Smoking status: Former Smoker    Quit date: 05/23/1988    Years since quitting: 32.3  . Smokeless tobacco: Never Used  . Tobacco comment: smoked 2 packs per day for 20 years  Vaping Use  . Vaping Use: Never used  Substance and Sexual Activity  . Alcohol use: No  . Drug use: No  . Sexual activity: Not on file  Other Topics Concern  . Not on file  Social History Narrative  . Not on file   Social Determinants of Health   Financial Resource Strain: Not on file  Food Insecurity: Not on file  Transportation Needs: Not on file  Physical Activity: Not on file  Stress: Not on file  Social Connections: Not on file  Intimate Partner Violence: Not on file    Family History:    Family History  Problem Relation Age of Onset  . Other Father 36       deceased old age   . Coronary artery disease Mother 74       deceased  . Hypertension Mother   . Other Sister 4       living and healthy  . Coronary artery disease Brother   .  Skin cancer Brother   . Colon cancer Neg Hx   . Pancreatic cancer Neg Hx   . Stomach cancer Neg Hx   . Esophageal cancer Neg Hx   . Liver disease Neg Hx      ROS:  Constitutional: Denied fever, chills, malaise, night sweats Eyes: Denied vision change or loss Ears/Nose/Mouth/Throat: Denied ear ache, sore throat, coughing, sinus pain Cardiovascular: see HPI  Respiratory: denied shortness of breath Gastrointestinal: Denied nausea, vomiting, abdominal pain, diarrhea Genital/Urinary: Denied dysuria, hematuria, urinary frequency/urgency Musculoskeletal: Denied muscle ache, joint pain, weakness Skin: Denied rash, wound Neuro: see HPI  Psych: Denied history of depression/anxiety  Endocrine: Denied history of diabetes    Physical Exam/Data:   Vitals:   09/19/20 [id number] 09/19/20 0628 09/19/20 0807 09/19/20 0842  BP: 133/68  129/70   Pulse: 73  74 66  Resp: 16  18   Temp: 98.2 F (36.8 C)  98.2 F (36.8 C)   TempSrc: Oral  Oral   SpO2: 97%  96%   Weight:  93.3 kg    Height:        Intake/Output Summary (Last 24 hours) at 09/19/2020 0923 Last data filed at 09/19/2020 [id number] Gross per 24 hour  Intake --  Output 300 ml  Net -300 ml   Last 3 Weights 09/19/2020 09/19/2020 09/18/2020  Weight (lbs) 205 lb 9.6 oz 207 lb 10.8 oz 210 lb  Weight (kg) 93.26 kg 94.2 kg 95.255 kg     Body mass index is 32.2 kg/m.   Vitals:  Vitals:   09/19/20 0807 09/19/20 0842  BP: 129/70   Pulse: 74 66  Resp: 18   Temp: 98.2 F (36.8 C)   SpO2: 96%    General Appearance: In no apparent distress, laying in bed HEENT: Normocephalic, atraumatic. EOMs intact.  Neck: Supple, trachea midline, no JVDs Cardiovascular: Regular rate and rhythm, normal S1-S2,  no murmur/rub/gallop Respiratory: Resting breathing unlabored, lungs sounds clear to  auscultation bilaterally, no use of accessory muscles. On room air.  No wheezes, rales or rhonchi.   Gastrointestinal: Bowel sounds positive, abdomen soft, non-tender, non-distended.  Extremities: Able to move all extremities in bed without difficulty, no edema of BLE  Genitourinary: genital exam not performed Musculoskeletal: Normal muscle bulk and tone Skin: Intact, warm, dry. No rashes or petechiae noted in exposed areas.  Neurologic: Alert, oriented to person, place and time. Fluent speech,  no cognitive deficit Psychiatric: Normal affect. Mood is appropriate.     EKG:  The EKG from 09/18/20 was personally reviewed and demonstrates:  sinus rhythm, ventricular rate of 73 bpm, first degree AV block, old anterior infarct, non-specific intraventricular delay, non-specific ST-T changes when comparing to EKG from 06/26/2019.  EKG repeated today without significant changes.   Telemetry:  Telemetry was personally reviewed and demonstrates:  Sinus rhythm with rate of 70s, occasional PVCs, no acute events noted    Relevant CV Studies:  Carotid U/S 10/15/19:  Right Carotid: Patent right carotid endarterectomy with no evidence for restenosis (1-39%).   Left Carotid: Velocities in the left ICA are consistent with a 1-39% stenosis.   Vertebrals: Bilateral vertebral arteries demonstrate antegrade flow.    Left heart cath on 03/26/2019:   Prox RCA lesion is 50% stenosed.  Dist RCA lesion is 60% stenosed.  RPDA lesion is 50% stenosed.  Prox Cx to Mid Cx lesion is 90% stenosed.  Prox LAD lesion is 99% stenosed.  Scoring balloon angioplasty was performed using a BALLOON WOLVERINE 2.50X10.  Post  intervention, there is a 20% residual stenosis.  A drug-eluting stent was successfully placed using a STENT SYNERGY DES 2.75X20.  Post intervention, there is a 0% residual stenosis.  Dist Cx lesion is 70% stenosed.  2nd Mrg lesion is 60% stenosed.  2nd Diag lesion is 80% stenosed.  The left  ventricular systolic function is normal.  LV end diastolic pressure is normal.  The left ventricular ejection fraction is 50-55% by visual estimate.  There is no mitral valve regurgitation.   1. Severe stenosis proximal LAD. Successful PTCA/DES x 1 proximal LAD 2. Severe restenosis in the mid Circumflex stented segment. Successful scoring balloon angioplasty within the old stented segment. No stent placed.  3. Moderate stenosis in the distal Circumflex branches.  4. Moderate restenosis within the old mid RCA stent. This does not appear to be flow limiting. Moderate distal RCA and PDA stenosis. 5. Low normal LV systolic function, LVEF around 50%.   Recommendations: DAPT with ASA and Plavix for at least one year. Continue beta blocker and statin.     TEE 06/29/2018:  1. The right ventricle has normal systolic function. The cavity was  normal.  2. The mitral valve is normal in structure.  3. The tricuspid valve was normal in structure.  4. The aortic valve is normal in structure. Aortic valve regurgitation is  trivial by color flow Doppler.  5. The pulmonic valve was normal in structure.  6. The interatrial septum appears to be lipomatous.  7. The left ventricle has mildly reduced systolic function of 40-97%.    Laboratory Data:  High Sensitivity Troponin:   Recent Labs  Lab 09/18/20 2050 09/19/20 0015 09/19/20 0215  TROPONINIHS 11 10 9      Chemistry Recent Labs  Lab 09/18/20 2050 09/19/20 0015  NA 135  --   K 4.2  --   CL 101  --   CO2 26  --   GLUCOSE 96  --   BUN 22  --   CREATININE 2.05* 1.98*  CALCIUM 9.7  --   GFRNONAA 33* 35*  ANIONGAP 8  --     No results for input(s): PROT, ALBUMIN, AST, ALT, ALKPHOS, BILITOT in the last 168 hours. Hematology Recent Labs  Lab 09/18/20 2050 09/19/20 0215  WBC 12.1* 6.1  RBC 5.42 4.71  HGB 15.1 13.4  HCT 47.8 40.7  MCV 88.2 86.4  MCH 27.9 28.5  MCHC 31.6 32.9  RDW 13.8 13.7  PLT 277 232   BNPNo results  for input(s): BNP, PROBNP in the last 168 hours.  DDimer No results for input(s): DDIMER in the last 168 hours.   Radiology/Studies:  DG Chest 2 View  Result Date: 09/18/2020 CLINICAL DATA:  Chest pressure and shortness of breath. EXAM: CHEST - 2 VIEW COMPARISON:  June 26, 2019 FINDINGS: Low lung volumes are noted, likely secondary to the degree of patient inspiration. Very mild atelectasis is seen within the left lung base. There is no evidence of a pleural effusion or pneumothorax. The heart size and mediastinal contours are within normal limits. There is moderate severity calcification of the aortic arch. Degenerative changes are seen throughout the thoracic spine. IMPRESSION: Very mild left basilar atelectasis. Electronically Signed   By: Virgina Norfolk M.D.   On: 09/18/2020 21:56     Assessment and Plan:   Chest pain, atypical  CAD s/p PCI to RCA, Cx, and LAD (last PCI 03/26/2019 to LAD) - presented to mid-sternum chest pain while watching TV, resolved spontaneously after 15  min - Hs trop negative x3 - EKG without acute ischemic changes  - Echo is pending report , will follow  - recommend continue medical therapy with ASA, plavix, coreg, statin, and PRN nitro, hold enalapril until AKI recovers - if symptom recur, may consider outpatient NM stress   Chronic systolic heart failure  - TEE from 06/29/2018 showed no significant valvular disease , mildly reduced EF 45-50% (improved from 35-50% on Echo completed on 06/22/18) - clinically hypovolemic at this time  - continue medical therapy with BBlocker, resume enalapril when AKI recovers   AKI on CKD stage IIIa - baseline Cr appears in 1- 1.2 ranges - Cr 2.05 POA, 1.98 today, likely the etiology of his generalized weakness and feeling run down - UA with SG 1.024, output 371ml per records from yesterday - agree with gentle IVF hydration  - recommend holding enalapril, avoid nephrotoxic agent, renal dose all meds  - trend daily renal  index, consider further workup if not improving   HTN - BP is fairly controlled , continue Coreg for now, amy resume enalapril when AKI is recovered   HLD - continue statin , LDL was 36 on 03/26/2019, will update with AM labs tomorrow   Hx of CVA - on DAPT and statin  Carotid artery stenosis s/p R CEA - carotid U/S 10/15/2019 no high grade stenosis     Risk Assessment/Risk Scores:   HEAR Score (for undifferentiated chest pain):  HEAR Score: 6   For questions or updates, please contact [place] Please consult [email or web address] for contact info under    Signed, Margie Billet, NP  09/19/2020 9:23 AM   Personally seen and examined. Agree with above.   75 year old male with complex coronary disease history, prior stroke, carotid artery disease seen for the evaluation of chest pain at the request of Dr. Nevada Crane.  Presented with sudden diaphoresis, substernal chest discomfort, nonradiating.  Lasted about 15 minutes and then spontaneously resolved.  Currently feeling well.  His creatinine was elevated however, AKI.  2.05 up from baseline of about 1.2.  Echocardiogram currently pending.  Exam, alert and oriented x3 low resting comfortably in bed without distress.  Normal respiratory effort.  Heart regular rate and rhythm no murmurs rubs or gallops.  No edema.  Lungs are clear.  Assessment and plan:  Chest discomfort - Troponin values are normal.  EKG unchanged from prior.  Personally reviewed multiple EKGs.  Interventricular conduction delay noted. - Echocardiogram currently pending. - Does not need any further cardiac testing while in hospital.  If symptoms worsen or become more worrisome, we could always consider outpatient stress test versus cardiac catheterization if absolutely necessary.  Reassurance has been given.  He feels better.  AKI - Getting fluids.  Enalapril has been held.  Resume as outpatient.  Continue to monitor basic metabolic profile.  Coronary artery disease -  Complex prior history.  On aspirin Plavix carvedilol statin.  Enalapril currently being held with AKI. -Troponins are negative.  Chronic systolic heart failure - Prior EF 45 to 50% on TEE in 2020.  EF have been reported out as 35% previously on transthoracic echocardiogram.  Echo currently pending. - Resume enalapril when AKI resolves.  Continue with Toprol.  History of stroke - Stable.  Treating post CEA.  If echocardiogram is stable and AKI improved, okay for discharge from cardiology perspective.  Candee Furbish, MD

## 2020-09-20 ENCOUNTER — Observation Stay (HOSPITAL_BASED_OUTPATIENT_CLINIC_OR_DEPARTMENT_OTHER): Payer: Medicare HMO

## 2020-09-20 DIAGNOSIS — Z20822 Contact with and (suspected) exposure to covid-19: Secondary | ICD-10-CM | POA: Diagnosis not present

## 2020-09-20 DIAGNOSIS — Z87891 Personal history of nicotine dependence: Secondary | ICD-10-CM | POA: Diagnosis not present

## 2020-09-20 DIAGNOSIS — Z955 Presence of coronary angioplasty implant and graft: Secondary | ICD-10-CM | POA: Diagnosis not present

## 2020-09-20 DIAGNOSIS — I2510 Atherosclerotic heart disease of native coronary artery without angina pectoris: Secondary | ICD-10-CM | POA: Diagnosis not present

## 2020-09-20 DIAGNOSIS — R0789 Other chest pain: Secondary | ICD-10-CM | POA: Diagnosis not present

## 2020-09-20 DIAGNOSIS — I5020 Unspecified systolic (congestive) heart failure: Secondary | ICD-10-CM | POA: Diagnosis not present

## 2020-09-20 DIAGNOSIS — N1831 Chronic kidney disease, stage 3a: Secondary | ICD-10-CM | POA: Diagnosis not present

## 2020-09-20 DIAGNOSIS — Z79899 Other long term (current) drug therapy: Secondary | ICD-10-CM | POA: Diagnosis not present

## 2020-09-20 DIAGNOSIS — M7989 Other specified soft tissue disorders: Secondary | ICD-10-CM

## 2020-09-20 DIAGNOSIS — Z7982 Long term (current) use of aspirin: Secondary | ICD-10-CM | POA: Diagnosis not present

## 2020-09-20 DIAGNOSIS — N179 Acute kidney failure, unspecified: Secondary | ICD-10-CM | POA: Diagnosis not present

## 2020-09-20 DIAGNOSIS — I10 Essential (primary) hypertension: Secondary | ICD-10-CM | POA: Diagnosis not present

## 2020-09-20 LAB — CBC WITH DIFFERENTIAL/PLATELET
Abs Immature Granulocytes: 0.01 10*3/uL (ref 0.00–0.07)
Basophils Absolute: 0 10*3/uL (ref 0.0–0.1)
Basophils Relative: 1 %
Eosinophils Absolute: 0.3 10*3/uL (ref 0.0–0.5)
Eosinophils Relative: 5 %
HCT: 38.3 % — ABNORMAL LOW (ref 39.0–52.0)
Hemoglobin: 12.5 g/dL — ABNORMAL LOW (ref 13.0–17.0)
Immature Granulocytes: 0 %
Lymphocytes Relative: 21 %
Lymphs Abs: 1.3 10*3/uL (ref 0.7–4.0)
MCH: 28.5 pg (ref 26.0–34.0)
MCHC: 32.6 g/dL (ref 30.0–36.0)
MCV: 87.4 fL (ref 80.0–100.0)
Monocytes Absolute: 0.6 10*3/uL (ref 0.1–1.0)
Monocytes Relative: 10 %
Neutro Abs: 4 10*3/uL (ref 1.7–7.7)
Neutrophils Relative %: 63 %
Platelets: 195 10*3/uL (ref 150–400)
RBC: 4.38 MIL/uL (ref 4.22–5.81)
RDW: 13.8 % (ref 11.5–15.5)
WBC: 6.3 10*3/uL (ref 4.0–10.5)
nRBC: 0 % (ref 0.0–0.2)

## 2020-09-20 LAB — LIPID PANEL
Cholesterol: 112 mg/dL (ref 0–200)
HDL: 33 mg/dL — ABNORMAL LOW (ref >40)
LDL Cholesterol: 44 mg/dL (ref 0–99)
Total CHOL/HDL Ratio: 3.4 RATIO
Triglycerides: 177 mg/dL — ABNORMAL HIGH (ref <150)
VLDL: 35 mg/dL (ref 0–40)

## 2020-09-20 LAB — COMPREHENSIVE METABOLIC PANEL
ALT: 20 U/L (ref 0–44)
AST: 18 U/L (ref 15–41)
Albumin: 3.1 g/dL — ABNORMAL LOW (ref 3.5–5.0)
Alkaline Phosphatase: 41 U/L (ref 38–126)
Anion gap: 8 (ref 5–15)
BUN: 29 mg/dL — ABNORMAL HIGH (ref 8–23)
CO2: 27 mmol/L (ref 22–32)
Calcium: 9 mg/dL (ref 8.9–10.3)
Chloride: 105 mmol/L (ref 98–111)
Creatinine, Ser: 1.91 mg/dL — ABNORMAL HIGH (ref 0.61–1.24)
GFR, Estimated: 36 mL/min — ABNORMAL LOW (ref >60)
Glucose, Bld: 104 mg/dL — ABNORMAL HIGH (ref 70–99)
Potassium: 4 mmol/L (ref 3.5–5.1)
Sodium: 140 mmol/L (ref 135–145)
Total Bilirubin: 0.3 mg/dL (ref 0.3–1.2)
Total Protein: 5.5 g/dL — ABNORMAL LOW (ref 6.5–8.1)

## 2020-09-20 LAB — URINE CULTURE: Culture: <10000 — AB

## 2020-09-20 LAB — BRAIN NATRIURETIC PEPTIDE: B Natriuretic Peptide: 17.4 pg/mL (ref 0.0–100.0)

## 2020-09-20 LAB — MAGNESIUM: Magnesium: 1.9 mg/dL (ref 1.7–2.4)

## 2020-09-20 MED ORDER — ISOSORBIDE MONONITRATE ER 30 MG PO TB24: 30.0000 mg | ORAL_TABLET | Freq: Every day | ORAL | 0 refills | Status: DC

## 2020-09-20 NOTE — Progress Notes (Signed)
VASCULAR LAB    Right upper extremity venous duplex has been performed.  See CV proc for preliminary results.   Izzy Courville, RVT 09/20/2020, 12:25 PM

## 2020-09-20 NOTE — Care Management Obs Status (Signed)
[place] NOTIFICATION   Patient Details  Name: Jeremy [name] MRN: [id number] Date of Birth: Dec 15, 1945   Medicare Observation Status Notification Given:  Yes    Carles Collet, RN 09/20/2020, 8:17 AM

## 2020-09-20 NOTE — Discharge Instructions (Signed)
Follow with Primary MD Celene Squibb, MD in 7 days   Get CBC, CMP checked next visit within 1 week by Primary MD   Activity: As tolerated with Full fall precautions use walker/cane & assistance as needed  Disposition Home    Diet: Heart Healthy    Special Instructions: If you have smoked or chewed Tobacco  in the last 2 yrs please stop smoking, stop any regular Alcohol  and or any Recreational drug use.  On your next visit with your primary care physician please Get Medicines reviewed and adjusted.  Please request your Prim.MD to go over all Hospital Tests and Procedure/Radiological results at the follow up, please get all Hospital records sent to your Prim MD by signing hospital release before you go home.  If you experience worsening of your admission symptoms, develop shortness of breath, life threatening emergency, suicidal or homicidal thoughts you must seek medical attention immediately by calling 911 or calling your MD immediately  if symptoms less severe.  You Must read complete instructions/literature along with all the possible adverse reactions/side effects for all the Medicines you take and that have been prescribed to you. Take any new Medicines after you have completely understood and accpet all the possible adverse reactions/side effects.

## 2020-09-20 NOTE — Discharge Summary (Signed)
Jeremy [name] [id number] DOB: [birth year] DOA: 09/18/2020  PCP: Celene Squibb, MD  Admit date: 09/18/2020  Discharge date: 09/20/2020  Admitted From: Home   Disposition:  Home   Recommendations for Outpatient Follow-up:   Follow up with PCP in 1-2 weeks  PCP Please obtain BMP/CBC, 2 view CXR in 1week,  (see Discharge instructions)   PCP Please follow up on the following pending results: Monitor blood pressure and renal function closely   Home Health: None   Equipment/Devices: None  Consultations: Cards Discharge Condition: Stable    CODE STATUS: Full    Diet Recommendation: Heart Healthy   Diet Order            Diet - low sodium heart healthy           Diet Heart Room service appropriate? Yes; Fluid consistency: Thin  Diet effective now                  Chief Complaint  Patient presents with  . Chest Pain    Pt c/o CP earlier with diaphoresis and SOB. Denies pain now.     Brief history of present illness from the day of admission and additional interim summary    Jeremy [name] a 74 y.o.malewith medical history significant forcoronary artery disease status post PCI with stent placement in 2020 on DAPT, essential hypertension, prior CVA with right CEA who presented to [facility] ED from a nursing facility where he was visiting a friend, he developed chest pain and diaphoresis came to the where he was found to have AKI, he was admitted for AKI/metabolic chest pain in the setting of history of CAD                                                                 Hospital Course    1. Chest pain in a patient with history of CAD-currently chest pain-free, EKG nonacute, troponin negative, he is on medical treatment with aspirin, plavix, statin and beta-blocker, cardiology following with recommendations for  outpatient work-up.   Echocardiogram noted with preserved EF of around 60% and no wall motion abnormality.  He is currently symptom-free and will be discharged home with outpatient follow-up with PCP and cardiology within 7 to 10 days of discharge.  2.  AKI on CKD 3A.    Mild AKI creatinine has plateaued around 1.9, baseline creatinine is around 1.6, hold lisinopril upon discharge substituted with Imdur, follow-up with PCP within 7 to 10 days for repeat BM, he will benefit from outpatient nephrology follow-up.  3. HTN -on beta-blocker, added Imdur for better control, avoiding ACE/ARB due to AKI.    4.  Dyslipidemia.  On statin continue.  5.  GERD.  On PPI.   Discharge diagnosis     Active Problems:  Chest pain    Discharge instructions    Discharge Instructions    Diet - low sodium heart healthy   Complete by: As directed    Discharge instructions   Complete by: As directed    Follow with Primary MD Celene Squibb, MD in 7 days   Get CBC, CMP checked next visit within 1 week by Primary MD   Activity: As tolerated with Full fall precautions use walker/cane & assistance as needed  Disposition Home    Diet: Heart Healthy    Special Instructions: If you have smoked or chewed Tobacco  in the last 2 yrs please stop smoking, stop any regular Alcohol  and or any Recreational drug use.  On your next visit with your primary care physician please Get Medicines reviewed and adjusted.  Please request your Prim.MD to go over all Hospital Tests and Procedure/Radiological results at the follow up, please get all Hospital records sent to your Prim MD by signing hospital release before you go home.  If you experience worsening of your admission symptoms, develop shortness of breath, life threatening emergency, suicidal or homicidal thoughts you must seek medical attention immediately by calling 911 or calling your MD immediately  if symptoms less severe.  You Must read complete  instructions/literature along with all the possible adverse reactions/side effects for all the Medicines you take and that have been prescribed to you. Take any new Medicines after you have completely understood and accpet all the possible adverse reactions/side effects.   Increase activity slowly   Complete by: As directed       Discharge Medications   Allergies as of 09/20/2020      Reactions   Codeine Anaphylaxis   Oxycontin [oxycodone Hcl] Shortness Of Breath, Itching   Through IV- has since had oral oxycodone without issue   Contrast Media [iodinated Diagnostic Agents]    Patient got very nauseated. Gave 4mg  Zofran. Possibly pre medicate with Zofran    Morphine And Related Itching      Medication List    STOP taking these medications   enalapril 10 MG tablet Commonly known as: VASOTEC     TAKE these medications   aspirin 81 MG EC tablet Take 1 tablet (81 mg total) by mouth daily.   carvedilol 12.5 MG tablet Commonly known as: COREG Take 1 tablet (12.5 mg total) by mouth 2 (two) times daily with a meal.   clopidogrel 75 MG tablet Commonly known as: PLAVIX Take 1 tablet (75 mg total) by mouth daily.   erythromycin ophthalmic ointment Place 1 application into both eyes 4 (four) times daily.   gabapentin 300 MG capsule Commonly known as: NEURONTIN Take 1 capsule (300 mg total) by mouth 3 (three) times daily.   isosorbide mononitrate 30 MG 24 hr tablet Commonly known as: IMDUR Take 1 tablet (30 mg total) by mouth daily.   levocetirizine 5 MG tablet Commonly known as: XYZAL Take 5 mg by mouth at bedtime as needed for allergies.   nitroGLYCERIN 0.4 MG SL tablet Commonly known as: NITROSTAT Place 1 tablet (0.4 mg total) under the tongue every 5 (five) minutes as needed for chest pain.   omeprazole 40 MG capsule Commonly known as: PRILOSEC Take 40 mg by mouth daily.   oxyCODONE 15 MG immediate release tablet Commonly known as: ROXICODONE Take 15 mg by mouth  every 6 (six) hours as needed for pain.   prochlorperazine 10 MG tablet Commonly known as: COMPAZINE Take 1 tablet (10 mg total)  by mouth 2 (two) times daily as needed (Headache).   rosuvastatin 40 MG tablet Commonly known as: CRESTOR Take 40 mg by mouth at bedtime.   SYSTANE COMPLETE OP Apply 1 drop to eye in the morning and at bedtime.   traMADol 50 MG tablet Commonly known as: ULTRAM Take 50 mg by mouth every 6 (six) hours as needed for moderate pain.        Follow-up Information    Celene Squibb, MD Follow up in 1 week(s).   Specialty: Internal Medicine Contact information: [address] [place] [facility] [address] [phone]        Josue Hector, MD. Schedule an appointment as soon as possible for a visit in 1 week(s).   Specialty: Cardiology Contact information: [address] [place] [address] [place] [address] [phone]               Major procedures and Radiology Reports - PLEASE review detailed and final reports thoroughly  -       DG Chest 2 View  Result Date: 09/18/2020 CLINICAL DATA:  Chest pressure and shortness of breath. EXAM: CHEST - 2 VIEW COMPARISON:  June 26, 2019 FINDINGS: Low lung volumes are noted, likely secondary to the degree of patient inspiration. Very mild atelectasis is seen within the left lung base. There is no evidence of a pleural effusion or pneumothorax. The heart size and mediastinal contours are within normal limits. There is moderate severity calcification of the aortic arch. Degenerative changes are seen throughout the thoracic spine. IMPRESSION: Very mild left basilar atelectasis. Electronically Signed   By: Virgina Norfolk M.D.   On: 09/18/2020 21:56   ECHOCARDIOGRAM LIMITED  Result Date: 09/19/2020    ECHOCARDIOGRAM LIMITED REPORT   Patient Name:   Jeremy [name] Date of Exam: 09/19/2020 Medical Rec #:  [id number]      Height:       67.0 in Accession #:    [id number]     Weight:       205.6 lb Date of Birth:   01-02-1946      BSA:          2.046 m Patient Age:    32 years       BP:           133/68 mmHg Patient Gender: M              HR:           65 bpm. Exam Location:  Inpatient Procedure: Limited Echo, Cardiac Doppler, Color Doppler and Intracardiac            Opacification Agent Indications:    Chest pain  History:        Patient has prior history of Echocardiogram examinations, most                 recent 06/29/2018. Previous Myocardial Infarction and CAD; Risk                 Factors:Hypertension, Dyslipidemia and Former Smoker. Stroke.                 S/P PCI of LAD and RCA. CKD.  Sonographer:    Clayton Lefort RDCS (AE) Referring Phys: [id number] Kayleen Memos  Sonographer Comments: Technically difficult study due to poor echo windows, no subcostal window and patient is morbidly obese. Image acquisition challenging due to patient body habitus. IMPRESSIONS  1. Limited echo with Definity contrast  2. Left  ventricular ejection fraction, by estimation, is 60 to 65%. The left ventricle has normal function. The left ventricle has no regional wall motion abnormalities. There is moderate left ventricular hypertrophy. Left ventricular diastolic parameters are consistent with Grade I diastolic dysfunction (impaired relaxation).  3. The aortic valve is tricuspid. Aortic valve regurgitation is not visualized.  4. Aortic dilatation noted. There is mild dilatation of the aortic root, measuring 39 mm. Comparison(s): Changes from prior study are noted. 06/22/2018: LVEF 35-40%. FINDINGS  Left Ventricle: Left ventricular ejection fraction, by estimation, is 60 to 65%. The left ventricle has normal function. The left ventricle has no regional wall motion abnormalities. Definity contrast agent was given IV to delineate the left ventricular  endocardial borders. There is moderate left ventricular hypertrophy. Left ventricular diastolic parameters are consistent with Grade I diastolic dysfunction (impaired relaxation). Indeterminate filling  pressures. Aortic Valve: The aortic valve is tricuspid. Aortic valve regurgitation is not visualized. Aorta: Aortic dilatation noted. There is mild dilatation of the aortic root, measuring 39 mm. LEFT VENTRICLE PLAX 2D LVIDd:         3.70 cm  Diastology LVIDs:         2.30 cm  LV e' medial:    5.33 cm/s LV PW:         1.60 cm  LV E/e' medial:  10.2 LV IVS:        1.50 cm  LV e' lateral:   6.20 cm/s LVOT diam:     2.00 cm  LV E/e' lateral: 8.8 LVOT Area:     3.14 cm  LEFT ATRIUM         Index LA diam:    3.20 cm 1.56 cm/m   AORTA Ao Root diam: 3.90 cm Ao Asc diam:  3.50 cm MITRAL VALVE MV Area (PHT): 2.73 cm     SHUNTS MV Decel Time: 278 msec     Systemic Diam: 2.00 cm MV E velocity: 54.40 cm/s MV A velocity: 103.00 cm/s MV E/A ratio:  0.53 Lyman Bishop MD Electronically signed by Lyman Bishop MD Signature Date/Time: 09/19/2020/11:58:32 AM    Final     Micro Results     Recent Results (from the past 240 hour(s))  Resp Panel by RT-PCR (Flu A&B, Covid) Nasopharyngeal Swab     Status: None   Collection Time: 09/18/20  1:00 AM   Specimen: Nasopharyngeal Swab; Nasopharyngeal(NP) swabs in vial transport medium  Result Value Ref Range Status   SARS Coronavirus 2 by RT PCR NEGATIVE NEGATIVE Final    Comment: (NOTE) SARS-CoV-2 target nucleic acids are NOT DETECTED.  The SARS-CoV-2 RNA is generally detectable in upper respiratory specimens during the acute phase of infection. The lowest concentration of SARS-CoV-2 viral copies this assay can detect is 138 copies/mL. A negative result does not preclude SARS-Cov-2 infection and should not be used as the sole basis for treatment or other patient management decisions. A negative result may occur with  improper specimen collection/handling, submission of specimen other than nasopharyngeal swab, presence of viral mutation(s) within the areas targeted by this assay, and inadequate number of viral copies(<138 copies/mL). A negative result must be combined  with clinical observations, patient history, and epidemiological information. The expected result is Negative.  Fact Sheet for Patients:  EntrepreneurPulse.com.au  Fact Sheet for Healthcare Providers:  IncredibleEmployment.be  This test is no t yet approved or cleared by the [place] FDA and  has been authorized for detection and/or diagnosis of SARS-CoV-2 by FDA under an Emergency  Use Authorization (EUA). This EUA will remain  in effect (meaning this test can be used) for the duration of the COVID-19 declaration under Section 564(b)(1) of the Act, 21 U.S.C.section 360bbb-3(b)(1), unless the authorization is terminated  or revoked sooner.       Influenza A by PCR NEGATIVE NEGATIVE Final   Influenza B by PCR NEGATIVE NEGATIVE Final    Comment: (NOTE) The Xpert Xpress SARS-CoV-2/FLU/RSV plus assay is intended as an aid in the diagnosis of influenza from Nasopharyngeal swab specimens and should not be used as a sole basis for treatment. Nasal washings and aspirates are unacceptable for Xpert Xpress SARS-CoV-2/FLU/RSV testing.  Fact Sheet for Patients: EntrepreneurPulse.com.au  Fact Sheet for Healthcare Providers: IncredibleEmployment.be  This test is not yet approved or cleared by the [place] FDA and has been authorized for detection and/or diagnosis of SARS-CoV-2 by FDA under an Emergency Use Authorization (EUA). This EUA will remain in effect (meaning this test can be used) for the duration of the COVID-19 declaration under Section 564(b)(1) of the Act, 21 U.S.C. section 360bbb-3(b)(1), unless the authorization is terminated or revoked.  Performed at [facility], [place] [address], [place], [place] [address]   Culture, Urine     Status: Abnormal   Collection Time: 09/18/20 11:58 PM   Specimen: Urine, Random  Result Value Ref Range Status   Specimen Description URINE, RANDOM  Final    Special Requests NONE  Final   Culture (A)  Final    <10,000 COLONIES/mL INSIGNIFICANT GROWTH Performed at [facility], [place] [address], [place], [place] [address]    Report Status 09/20/2020 FINAL  Final    Today   Subjective    Jeremy [name] today has no headache,no chest abdominal pain,no new weakness tingling or numbness, feels much better wants to go home today.     Objective   Blood pressure 140/67, pulse 71, temperature (!) 97.3 F (36.3 C), temperature source Oral, resp. rate 18, height 5\' 7"  (1.702 m), weight 94.7 kg, SpO2 97 %.   Intake/Output Summary (Last 24 hours) at 09/20/2020 0951 Last data filed at 09/20/2020 0218 Gross per 24 hour  Intake 1584.27 ml  Output 375 ml  Net 1209.27 ml    Exam  Awake Alert, No new F.N deficits, Normal affect [place].AT,PERRAL Supple Neck,No JVD, No cervical lymphadenopathy appriciated.  Symmetrical Chest wall movement, Good air movement bilaterally, CTAB RRR,No Gallops,Rubs or new Murmurs, No Parasternal Heave +ve B.Sounds, Abd Soft, Non tender, No organomegaly appriciated, No rebound -guarding or rigidity. No Cyanosis, Clubbing or edema, No new Rash or bruise   Data Review   CBC w Diff:  Lab Results  Component Value Date   WBC 6.3 09/20/2020   HGB 12.5 (L) 09/20/2020   HGB 13.1 02/11/2019   HCT 38.3 (L) 09/20/2020   HCT 40.0 02/11/2019   PLT 195 09/20/2020   PLT 201 02/11/2019   LYMPHOPCT 21 09/20/2020   MONOPCT 10 09/20/2020   EOSPCT 5 09/20/2020   BASOPCT 1 09/20/2020    CMP:  Lab Results  Component Value Date   NA 140 09/20/2020   NA 141 02/11/2019   K 4.0 09/20/2020   CL 105 09/20/2020   CO2 27 09/20/2020   BUN 29 (H) 09/20/2020   BUN 30 (H) 02/11/2019   CREATININE 1.91 (H) 09/20/2020   PROT 5.5 (L) 09/20/2020   PROT 5.9 (L) 02/11/2019   ALBUMIN 3.1 (L) 09/20/2020   ALBUMIN 4.0 02/11/2019   BILITOT 0.3  09/20/2020   BILITOT 0.3 02/11/2019   ALKPHOS 41 09/20/2020   AST 18 09/20/2020   ALT 20  09/20/2020  .   Total Time in preparing paper work, data evaluation and todays exam - 89 minutes  Lala Lund M.D on 09/20/2020 at 9:51 AM  [facility]

## 2020-09-22 NOTE — Progress Notes (Signed)
Cardiology Office Note    Date:  09/22/2020   ID:  Jeremy [name], DOB [birth year], MRN [id number]  PCP:  Celene Squibb, MD  Cardiologist: Jenkins Rouge, MD     History of Present Illness:    75 y.o. previously seen by Dr Julianne Rice. History of CAD with stenting of the CircumlfexLAD. CRF;s HTN, HLD. PVD with history CVA and right CEA   NSTEMI 03/25/19 peak troponin 5345 ICLBBB on ECG  Cath by Dr. Angelena Form on 03/26/2019 and showed 50% prox-RCA stenosis, 60% dRCA, 50% RPDA, 90% prox LCx, 99% Prox-LAD, 70% Distal LCx, 60% 2nd Mrg, 80% 2nd Diagonal and preserved EF of 50-55%. Was treated with PTCA/DESx1 to the LAD and successful scoring balloon angioplasty in the previously stented segment of LCx. He was continued on ASA and Plavix with recommendations to continue this for at least 1 year along with beta-blocker and statin therapy.  Life long DAT with stents , restenosis and CVA   Active with no angina  Indicates his son has CAD and wants to be seen as new patient Use to see [place]  Admitted 09/18/20 with chest pain diaphoresis r/o TTE normal EF no RWMAls And no acute ECG changes Cr elevated 1.9 from baseline 1.6 ACE d/c and imdur started   No angina compliant with meds needs new nitro    Past Medical History:  Diagnosis Date  . Arthritis   . CAD (coronary artery disease)    a. s/p prior stenting of LAD and RCA b. 03/2019: NSTEMI and required PTCA/DESx1 to the LAD and successful scoring balloon angioplasty in the previously stented segment of LCx  . Carotid stenosis, asymptomatic, right   . Carpal tunnel syndrome    bilateral  . Coronary artery disease   . GERD (gastroesophageal reflux disease)   . History of kidney stones   . Hx of colonic polyp   . Hypercholesterolemia   . Hypertension   . Myocardial infarction ([place])    hx of  . Obesity   . Squamous cell carcinoma of skin 08/09/2016   well differentiated on right outer eye - tx p bx  . Stroke [facility])     Past Surgical  History:  Procedure Laterality Date  . CARDIAC CATHETERIZATION    . CORONARY BALLOON ANGIOPLASTY N/A 03/26/2019   Procedure: CORONARY BALLOON ANGIOPLASTY;  Surgeon: Burnell Blanks, MD;  Location: [place] CV LAB;  Service: Cardiovascular;  Laterality: N/A;  . CORONARY STENT INTERVENTION N/A 03/26/2019   Procedure: CORONARY STENT INTERVENTION;  Surgeon: Burnell Blanks, MD;  Location: [place] CV LAB;  Service: Cardiovascular;  Laterality: N/A;  . CORONARY STENT PLACEMENT  2000   x3   . CYST EXCISION     x 2  . ENDARTERECTOMY Right 12/25/2017   Procedure: ENDARTERECTOMY CAROTID RIGHT;  Surgeon: Rosetta Posner, MD;  Location: [place];  Service: Vascular;  Laterality: Right;  . LEFT HEART CATH AND CORONARY ANGIOGRAPHY N/A 03/26/2019   Procedure: LEFT HEART CATH AND CORONARY ANGIOGRAPHY;  Surgeon: Burnell Blanks, MD;  Location: [place] CV LAB;  Service: Cardiovascular;  Laterality: N/A;  . PATCH ANGIOPLASTY Right 12/25/2017   Procedure: PATCH ANGIOPLASTY RIGHT CAROTID ARTERY;  Surgeon: Rosetta Posner, MD;  Location: [place];  Service: Vascular;  Laterality: Right;  . TEE WITHOUT CARDIOVERSION N/A 06/29/2018   Procedure: TRANSESOPHAGEAL ECHOCARDIOGRAM (TEE);  Surgeon: Fay Records, MD;  Location: [facility] ENDOSCOPY;  Service: Cardiovascular;  Laterality: N/A;    Current Medications: Outpatient Medications  Prior to Visit  Medication Sig Dispense Refill  . aspirin EC 81 MG EC tablet Take 1 tablet (81 mg total) by mouth daily. 90 tablet 3  . carvedilol (COREG) 12.5 MG tablet Take 1 tablet (12.5 mg total) by mouth 2 (two) times daily with a meal. 60 tablet 0  . clopidogrel (PLAVIX) 75 MG tablet Take 1 tablet (75 mg total) by mouth daily. 90 tablet 3  . erythromycin ophthalmic ointment Place 1 application into both eyes 4 (four) times daily.    Marland Kitchen gabapentin (NEURONTIN) 300 MG capsule Take 1 capsule (300 mg total) by mouth 3 (three) times daily. 90 capsule 6  . isosorbide  mononitrate (IMDUR) 30 MG 24 hr tablet Take 1 tablet (30 mg total) by mouth daily. 30 tablet 0  . levocetirizine (XYZAL) 5 MG tablet Take 5 mg by mouth at bedtime as needed for allergies.    . nitroGLYCERIN (NITROSTAT) 0.4 MG SL tablet Place 1 tablet (0.4 mg total) under the tongue every 5 (five) minutes as needed for chest pain. 25 tablet 0  . omeprazole (PRILOSEC) 40 MG capsule Take 40 mg by mouth daily.    Marland Kitchen oxyCODONE (ROXICODONE) 15 MG immediate release tablet Take 15 mg by mouth every 6 (six) hours as needed for pain.    Marland Kitchen prochlorperazine (COMPAZINE) 10 MG tablet Take 1 tablet (10 mg total) by mouth 2 (two) times daily as needed (Headache). (Patient not taking: No sig reported) 10 tablet 0  . Propylene Glycol (SYSTANE COMPLETE OP) Apply 1 drop to eye in the morning and at bedtime.    . rosuvastatin (CRESTOR) 40 MG tablet Take 40 mg by mouth at bedtime.    . traMADol (ULTRAM) 50 MG tablet Take 50 mg by mouth every 6 (six) hours as needed for moderate pain.     No facility-administered medications prior to visit.     Allergies:   Codeine, Oxycontin [oxycodone hcl], Contrast media [iodinated diagnostic agents], and Morphine and related   Social History   Socioeconomic History  . Marital status: Divorced    Spouse name: Not on file  . Number of children: Not on file  . Years of education: Not on file  . Highest education level: Not on file  Occupational History  . Occupation: retired    Comment: welder  Tobacco Use  . Smoking status: Former Smoker    Quit date: 05/23/1988    Years since quitting: 32.3  . Smokeless tobacco: Never Used  . Tobacco comment: smoked 2 packs per day for 20 years  Vaping Use  . Vaping Use: Never used  Substance and Sexual Activity  . Alcohol use: No  . Drug use: No  . Sexual activity: Not on file  Other Topics Concern  . Not on file  Social History Narrative  . Not on file   Social Determinants of Health   Financial Resource Strain: Not on file   Food Insecurity: Not on file  Transportation Needs: Not on file  Physical Activity: Not on file  Stress: Not on file  Social Connections: Not on file     Family History:  The patient's family history includes Coronary artery disease in his brother; Coronary artery disease (age of onset: 40) in his mother; Hypertension in his mother; Other (age of onset: 64) in his father; Other (age of onset: 83) in his sister; Skin cancer in his brother.   Review of Systems:   Please see the history of present illness.  General:  No chills, fever, night sweats or weight changes. Positive for bilateral shoulder pain (occurring for several months). Cardiovascular:  No chest pain, dyspnea on exertion, edema, orthopnea, palpitations, paroxysmal nocturnal dyspnea. Dermatological: No rash, lesions/masses Respiratory: No cough, dyspnea Urologic: No hematuria, dysuria Abdominal:   No nausea, vomiting, diarrhea, bright red blood per rectum, melena, or hematemesis Neurologic:  No visual changes, wkns, changes in mental status. All other systems reviewed and are otherwise negative except as noted above.   Physical Exam:    VS:  There were no vitals taken for this visit.    Affect appropriate Healthy:  appears stated age 54: normal Neck supple with no adenopathy JVP normal right CEA s no thyromegaly Lungs clear with no wheezing and good diaphragmatic motion Heart:  S1/S2 no murmur, no rub, gallop or click PMI normal Abdomen: benighn, BS positve, no tenderness, no AAA no bruit.  No HSM or HJR Distal pulses intact with no bruits No edema Neuro non-focal Skin warm and dry No muscular weakness Restricted ROM both shoulders    Wt Readings from Last 3 Encounters:  09/20/20 94.7 kg  04/14/20 97.5 kg  11/12/19 93.4 kg     Studies/Labs Reviewed:   EKG:   NSR, HR 65 with anterior infarct pattern and diffuse TWI along the lateral leads which has improved when compared to his prior tracing.    Recent Labs: 09/20/2020: ALT 20; B Natriuretic Peptide 17.4; BUN 29; Creatinine, Ser 1.91; Hemoglobin 12.5; Magnesium 1.9; Platelets 195; Potassium 4.0; Sodium 140   Lipid Panel    Component Value Date/Time   CHOL 112 09/20/2020 0141   CHOL 125 02/11/2019 0847   TRIG 177 (H) 09/20/2020 0141   HDL 33 (L) 09/20/2020 0141   HDL 45 02/11/2019 0847   CHOLHDL 3.4 09/20/2020 0141   VLDL 35 09/20/2020 0141   LDLCALC 44 09/20/2020 0141   LDLCALC 64 02/11/2019 0847    Additional studies/ records that were reviewed today include:   Cardiac Catheterization: 03/26/2019  Prox RCA lesion is 50% stenosed.  Dist RCA lesion is 60% stenosed.  RPDA lesion is 50% stenosed.  Prox Cx to Mid Cx lesion is 90% stenosed.  Prox LAD lesion is 99% stenosed.  Scoring balloon angioplasty was performed using a BALLOON WOLVERINE 2.50X10.  Post intervention, there is a 20% residual stenosis.  A drug-eluting stent was successfully placed using a STENT SYNERGY DES 2.75X20.  Post intervention, there is a 0% residual stenosis.  Dist Cx lesion is 70% stenosed.  2nd Mrg lesion is 60% stenosed.  2nd Diag lesion is 80% stenosed.  The left ventricular systolic function is normal.  LV end diastolic pressure is normal.  The left ventricular ejection fraction is 50-55% by visual estimate.  There is no mitral valve regurgitation.   1. Severe stenosis proximal LAD. Successful PTCA/DES x 1 proximal LAD 2. Severe restenosis in the mid Circumflex stented segment. Successful scoring balloon angioplasty within the old stented segment. No stent placed.  3. Moderate stenosis in the distal Circumflex branches.  4. Moderate restenosis within the old mid RCA stent. This does not appear to be flow limiting. Moderate distal RCA and PDA stenosis. 5. Low normal LV systolic function, LVEF around 50%.   Recommendations: DAPT with ASA and Plavix for at least one year. Continue beta blocker and statin.     Echocardiogram: 05/2018 IMPRESSIONS    1. The left ventricle has moderately reduced systolic function of 123456. The cavity size is normal. There is severe  left ventricular wall thickness. Echo evidence of pseudonormal diastolic filling patterns.  2. Normal left atrial size.  3. Normal right atrial size.  4. The mitral valve normal in structure. Regurgitation is mild by color flow Doppler.  5. Normal tricuspid valve.  6. Tricuspid regurgitation is mild.  7. The aortic valve tricuspid. There is mild thickening of the aortic valve.  8. No atrial level shunt detected by color flow Doppler.  Assessment:    CAD  Plan:   In order of problems listed above:  1. CAD:  Distant history of RCA stent and more recently 03/2019 intervention to LAD and circumflex 50% residual instent RCA stenosis 60% distal RCA , 60% OM1 and 80% small D2 Continue medical Rx EF normal   Note myovue 03/20/19 showed no ischemia prior to intervention 03/26/19   2. HTN Well controlled.  Continue current medications and low sodium Dash type diet.    3. HLD  LDL 36 09/20/20 continue crestor    4. History of CVA continue DAT and statin duplex 09/2019 plaque no stenosis f/u [facility] Dr Donnetta Hutching retiring will need to establish with another MD   5. CRF:  Baseline Cr 1.6 f/u BMET ACE d/c    F/U  In a year  BMET  New nitro called in    Medication Adjustments/Labs and Tests Ordered: Current medicines are reviewed at length with the patient today.  Concerns regarding medicines are outlined above.  Medication changes, Labs and Tests ordered today are listed in the Patient Instructions below. There are no Patient Instructions on file for this visit.   Signed, Jenkins Rouge, MD  09/22/2020 2:15 PM    [place]. [address] [place], [place] [address] Phone: [phone] Fax: [phone]

## 2020-09-28 DIAGNOSIS — N529 Male erectile dysfunction, unspecified: Secondary | ICD-10-CM | POA: Diagnosis not present

## 2020-09-28 DIAGNOSIS — I259 Chronic ischemic heart disease, unspecified: Secondary | ICD-10-CM | POA: Diagnosis not present

## 2020-09-28 DIAGNOSIS — R1012 Left upper quadrant pain: Secondary | ICD-10-CM | POA: Diagnosis not present

## 2020-09-28 DIAGNOSIS — I10 Essential (primary) hypertension: Secondary | ICD-10-CM | POA: Diagnosis not present

## 2020-09-28 DIAGNOSIS — N400 Enlarged prostate without lower urinary tract symptoms: Secondary | ICD-10-CM | POA: Diagnosis not present

## 2020-09-28 DIAGNOSIS — E782 Mixed hyperlipidemia: Secondary | ICD-10-CM | POA: Diagnosis not present

## 2020-09-28 DIAGNOSIS — N401 Enlarged prostate with lower urinary tract symptoms: Secondary | ICD-10-CM | POA: Diagnosis not present

## 2020-09-28 DIAGNOSIS — M5440 Lumbago with sciatica, unspecified side: Secondary | ICD-10-CM | POA: Diagnosis not present

## 2020-09-28 DIAGNOSIS — E1122 Type 2 diabetes mellitus with diabetic chronic kidney disease: Secondary | ICD-10-CM | POA: Diagnosis not present

## 2020-09-30 DIAGNOSIS — N179 Acute kidney failure, unspecified: Secondary | ICD-10-CM | POA: Insufficient documentation

## 2020-10-05 ENCOUNTER — Other Ambulatory Visit (HOSPITAL_COMMUNITY)
Admission: RE | Admit: 2020-10-05 | Discharge: 2020-10-05 | Disposition: A | Discharge status: Home / Self Care | Payer: Medicare HMO | Source: Ambulatory Visit | Attending: Cardiovascular Disease | Admitting: Cardiovascular Disease

## 2020-10-05 ENCOUNTER — Ambulatory Visit (INDEPENDENT_AMBULATORY_CARE_PROVIDER_SITE_OTHER): Payer: Medicare HMO | Admitting: Cardiovascular Disease

## 2020-10-05 ENCOUNTER — Other Ambulatory Visit: Payer: Self-pay

## 2020-10-05 ENCOUNTER — Encounter: Payer: Self-pay | Admitting: Cardiovascular Disease

## 2020-10-05 VITALS — BP 141/64 | HR 64 | Resp 18 | Ht 67.0 in | Wt 216.2 lb

## 2020-10-05 DIAGNOSIS — Z79899 Other long term (current) drug therapy: Secondary | ICD-10-CM | POA: Diagnosis not present

## 2020-10-05 DIAGNOSIS — Z9889 Other specified postprocedural states: Secondary | ICD-10-CM | POA: Diagnosis not present

## 2020-10-05 LAB — BASIC METABOLIC PANEL
Anion gap: 6 (ref 5–15)
BUN: 19 mg/dL (ref 8–23)
CO2: 26 mmol/L (ref 22–32)
Calcium: 8.9 mg/dL (ref 8.9–10.3)
Chloride: 104 mmol/L (ref 98–111)
Creatinine, Ser: 1.32 mg/dL — ABNORMAL HIGH (ref 0.61–1.24)
GFR, Estimated: 57 mL/min — ABNORMAL LOW (ref >60)
Glucose, Bld: 111 mg/dL — ABNORMAL HIGH (ref 70–99)
Potassium: 4.2 mmol/L (ref 3.5–5.1)
Sodium: 136 mmol/L (ref 135–145)

## 2020-10-05 MED ORDER — NITROGLYCERIN 0.4 MG SL SUBL: 0.4000 mg | SUBLINGUAL_TABLET | SUBLINGUAL | 3 refills | Status: AC | PRN

## 2020-10-05 NOTE — Patient Instructions (Signed)
Medication Instructions:  Your physician recommends that you continue on your current medications as directed. Please refer to the Current Medication list given to you today.  *If you need a refill on your cardiac medications before your next appointment, please call your pharmacy*   Lab Work: BMET   If you have labs (blood work) drawn today and your tests are completely normal, you will receive your results only by: Marland Kitchen MyChart Message (if you have MyChart) OR . A paper copy in the mail If you have any lab test that is abnormal or we need to change your treatment, we will call you to review the results.   Testing/Procedures: None    Follow-Up: At [facility], you and your health needs are our priority.  As part of our continuing mission to provide you with exceptional heart care, we have created designated Provider Care Teams.  These Care Teams include your primary Cardiologist (physician) and Advanced Practice Providers (APPs -  Physician Assistants and Nurse Practitioners) who all work together to provide you with the care you need, when you need it.  We recommend signing up for the patient portal called "MyChart".  Sign up information is provided on this After Visit Summary.  MyChart is used to connect with patients for Virtual Visits (Telemedicine).  Patients are able to view lab/test results, encounter notes, upcoming appointments, etc.  Non-urgent messages can be sent to your provider as well.   To learn more about what you can do with MyChart, go to [email or web address].    Your next appointment:   12 month(s)  The format for your next appointment:   In Person  Provider:   Jenkins Rouge, MD   Other Instructions I place d a referral to Dr.Early at [facility]. They will call you to make an appointment

## 2020-10-07 ENCOUNTER — Telehealth: Payer: Self-pay

## 2020-10-07 NOTE — Telephone Encounter (Signed)
-----   Message from Josue Hector, MD sent at 10/05/2020 11:06 AM EDT ----- Bmet good Cr better

## 2020-10-07 NOTE — Telephone Encounter (Signed)
Pt/spouse notified and verbalized understanding.  

## 2020-10-15 ENCOUNTER — Other Ambulatory Visit: Payer: Self-pay | Admitting: *Deleted

## 2020-10-15 DIAGNOSIS — I6521 Occlusion and stenosis of right carotid artery: Secondary | ICD-10-CM

## 2020-10-20 DIAGNOSIS — I10 Essential (primary) hypertension: Secondary | ICD-10-CM | POA: Diagnosis not present

## 2020-10-20 DIAGNOSIS — I2510 Atherosclerotic heart disease of native coronary artery without angina pectoris: Secondary | ICD-10-CM | POA: Diagnosis not present

## 2020-10-28 ENCOUNTER — Telehealth: Payer: Self-pay | Admitting: Cardiovascular Disease

## 2020-10-28 MED ORDER — ISOSORBIDE MONONITRATE ER 30 MG PO TB24: 30.0000 mg | ORAL_TABLET | Freq: Every day | ORAL | 3 refills | Status: DC

## 2020-10-28 NOTE — Telephone Encounter (Signed)
Pt is out of his isosorbide mononitrate (IMDUR) 30 MG 24 hr tablet [[id number]] ENDED And is needing a refill sent to [facility] in [place]

## 2020-10-28 NOTE — Telephone Encounter (Signed)
Medication refill request for Imdur 30 mg tablets approved and sent to [facility] in [place], [place] per pt request.

## 2020-11-02 ENCOUNTER — Ambulatory Visit: Payer: Medicare HMO | Admitting: Physician Assistant

## 2020-11-04 DIAGNOSIS — R5383 Other fatigue: Secondary | ICD-10-CM | POA: Insufficient documentation

## 2020-11-04 DIAGNOSIS — N401 Enlarged prostate with lower urinary tract symptoms: Secondary | ICD-10-CM | POA: Insufficient documentation

## 2020-11-05 ENCOUNTER — Ambulatory Visit: Payer: Medicare HMO | Admitting: Physician Assistant

## 2020-11-10 ENCOUNTER — Other Ambulatory Visit: Payer: Self-pay | Admitting: Cardiovascular Disease

## 2020-11-10 NOTE — Telephone Encounter (Signed)
This is a [place] pt.  °

## 2020-11-25 ENCOUNTER — Ambulatory Visit: Payer: Medicare HMO | Admitting: Vascular Surgery

## 2020-12-03 DIAGNOSIS — R051 Acute cough: Secondary | ICD-10-CM | POA: Diagnosis not present

## 2020-12-03 DIAGNOSIS — E7800 Pure hypercholesterolemia, unspecified: Secondary | ICD-10-CM | POA: Diagnosis not present

## 2020-12-03 DIAGNOSIS — R059 Cough, unspecified: Secondary | ICD-10-CM | POA: Insufficient documentation

## 2020-12-03 DIAGNOSIS — I10 Essential (primary) hypertension: Secondary | ICD-10-CM | POA: Diagnosis not present

## 2020-12-03 DIAGNOSIS — U071 COVID-19: Secondary | ICD-10-CM | POA: Diagnosis not present

## 2021-01-18 DIAGNOSIS — H01001 Unspecified blepharitis right upper eyelid: Secondary | ICD-10-CM | POA: Diagnosis not present

## 2021-01-18 DIAGNOSIS — H01002 Unspecified blepharitis right lower eyelid: Secondary | ICD-10-CM | POA: Diagnosis not present

## 2021-01-18 DIAGNOSIS — H01004 Unspecified blepharitis left upper eyelid: Secondary | ICD-10-CM | POA: Diagnosis not present

## 2021-01-18 DIAGNOSIS — H40012 Open angle with borderline findings, low risk, left eye: Secondary | ICD-10-CM | POA: Diagnosis not present

## 2021-01-18 DIAGNOSIS — H2513 Age-related nuclear cataract, bilateral: Secondary | ICD-10-CM | POA: Diagnosis not present

## 2021-01-20 DIAGNOSIS — E7800 Pure hypercholesterolemia, unspecified: Secondary | ICD-10-CM | POA: Diagnosis not present

## 2021-01-20 DIAGNOSIS — I10 Essential (primary) hypertension: Secondary | ICD-10-CM | POA: Diagnosis not present

## 2021-02-10 ENCOUNTER — Ambulatory Visit (INDEPENDENT_AMBULATORY_CARE_PROVIDER_SITE_OTHER): Payer: Medicare HMO | Admitting: Vascular Surgery

## 2021-02-10 ENCOUNTER — Encounter: Payer: Self-pay | Admitting: Vascular Surgery

## 2021-02-10 ENCOUNTER — Other Ambulatory Visit: Payer: Self-pay

## 2021-02-10 ENCOUNTER — Ambulatory Visit (INDEPENDENT_AMBULATORY_CARE_PROVIDER_SITE_OTHER): Payer: Medicare HMO

## 2021-02-10 VITALS — BP 124/64 | HR 64 | Temp 97.7°F | Ht 67.0 in | Wt 216.8 lb

## 2021-02-10 DIAGNOSIS — I6521 Occlusion and stenosis of right carotid artery: Secondary | ICD-10-CM

## 2021-02-10 NOTE — Progress Notes (Signed)
[facility] [place]  Patient name: Jeremy [name] MRN: [id number] DOB: [birth year] Sex: male  REASON FOR VISIT: Follow-up extracranial cerebrovascular occlusive disease  HPI: Jeremy [name] is a 75 y.o. male here today for follow-up.  He is status post right carotid endarterectomy for severe asymptomatic disease on 12/25/2017.  He does have a history of occipital CVA in January 2020.  He has had no new neurologic deficits.  He does have history of coronary disease undergoing stenting in the past most recently in November 2020.  He reports no neurologic changes and denies any new cardiac difficulty.  Past Medical History:  Diagnosis Date   Arthritis    CAD (coronary artery disease)    a. s/p prior stenting of LAD and RCA b. 03/2019: NSTEMI and required PTCA/DESx1 to the LAD and successful scoring balloon angioplasty in the previously stented segment of LCx   Carotid stenosis, asymptomatic, right    Carpal tunnel syndrome    bilateral   Coronary artery disease    GERD (gastroesophageal reflux disease)    History of kidney stones    Hx of colonic polyp    Hypercholesterolemia    Hypertension    Myocardial infarction ([facility])    hx of   Obesity    Squamous cell carcinoma of skin 08/09/2016   well differentiated on right outer eye - tx p bx   Stroke ([place])     Family History  Problem Relation Age of Onset   Other Father 23       deceased old age   Coronary artery disease Mother 92       deceased   Hypertension Mother    Other Sister 84       living and healthy   Coronary artery disease Brother    Skin cancer Brother    Colon cancer Neg Hx    Pancreatic cancer Neg Hx    Stomach cancer Neg Hx    Esophageal cancer Neg Hx    Liver disease Neg Hx     SOCIAL HISTORY: Social History   Tobacco Use   Smoking status: Former    Types: Cigarettes    Quit date: 05/23/1988    Years since quitting: 32.7   Smokeless tobacco:  Never   Tobacco comments:    smoked 2 packs per day for 20 years  Substance Use Topics   Alcohol use: No    Allergies  Allergen Reactions   Codeine Anaphylaxis   Oxycontin [Oxycodone Hcl] Shortness Of Breath and Itching    Through IV- has since had oral oxycodone without issue   Contrast Media [Iodinated Diagnostic Agents]     Patient got very nauseated. Gave 4mg  Zofran. Possibly pre medicate with Zofran    Morphine And Related Itching    Current Outpatient Medications  Medication Sig Dispense Refill   aspirin EC 81 MG EC tablet Take 1 tablet (81 mg total) by mouth daily. 90 tablet 3   carvedilol (COREG) 12.5 MG tablet Take 1 tablet (12.5 mg total) by mouth 2 (two) times daily with a meal. 60 tablet 0   clopidogrel (PLAVIX) 75 MG tablet Take 1 tablet by mouth once daily 90 tablet 2   gabapentin (NEURONTIN) 300 MG capsule Take 1 capsule (300 mg total) by mouth 3 (three) times daily. 90 capsule 6   levocetirizine (XYZAL) 5 MG tablet Take 5 mg by mouth at bedtime as needed for allergies.     nitroGLYCERIN (NITROSTAT) 0.4 MG  SL tablet Place 1 tablet (0.4 mg total) under the tongue every 5 (five) minutes as needed for chest pain. 25 tablet 3   omeprazole (PRILOSEC) 40 MG capsule Take 40 mg by mouth daily.     oxyCODONE (ROXICODONE) 15 MG immediate release tablet Take 15 mg by mouth every 6 (six) hours as needed for pain.     prochlorperazine (COMPAZINE) 10 MG tablet Take 1 tablet (10 mg total) by mouth 2 (two) times daily as needed (Headache). 10 tablet 0   rosuvastatin (CRESTOR) 40 MG tablet Take 40 mg by mouth at bedtime.     erythromycin ophthalmic ointment Place 1 application into both eyes 4 (four) times daily. (Patient not taking: Reported on 02/10/2021)     isosorbide mononitrate (IMDUR) 30 MG 24 hr tablet Take 1 tablet (30 mg total) by mouth daily. 90 tablet 3   Propylene Glycol (SYSTANE COMPLETE OP) Apply 1 drop to eye in the morning and at bedtime. (Patient not taking: Reported on  02/10/2021)     traMADol (ULTRAM) 50 MG tablet Take 50 mg by mouth every 6 (six) hours as needed for moderate pain. (Patient not taking: Reported on 02/10/2021)     No current facility-administered medications for this visit.    REVIEW OF SYSTEMS:  [X]  denotes positive finding, [ ]  denotes negative finding Cardiac  Comments:  Chest pain or chest pressure:    Shortness of breath upon exertion:    Short of breath when lying flat:    Irregular heart rhythm:        Vascular    Pain in calf, thigh, or hip brought on by ambulation:    Pain in feet at night that wakes you up from your sleep:     Blood clot in your veins:    Leg swelling:           PHYSICAL EXAM: Vitals:   02/10/21 0836  BP: 124/64  Pulse: 64  Temp: 97.7 F (36.5 C)  TempSrc: Skin  SpO2: 94%  Weight: 216 lb 12.8 oz (98.3 kg)  Height: 5\' 7"  (1.702 m)    GENERAL: The patient is a well-nourished male, in no acute distress. The vital signs are documented above. CARDIOVASCULAR: Right neck incision well-healed.  No carotid bruits.  2+ radial pulses PULMONARY: There is good air exchange  MUSCULOSKELETAL: There are no major deformities or cyanosis. NEUROLOGIC: No focal weakness or paresthesias are detected. SKIN: There are no ulcers or rashes noted. PSYCHIATRIC: The patient has a normal affect.  DATA:  Carotid duplex today reveals widely patent endarterectomy with no evidence of restenosis.  On the left he has 1 to 39% stenosis  MEDICAL ISSUES: Stable status post right carotid endarterectomy in 2019.  He does report some occasional episodes of feeling weak in general.  Also his wife reports that occasionally he will have days where he does not have energy all day.  He has checked his blood pressure during these episodes and notes no difference.  I explained that in all likelihood this would be a symptom that he will have to live with.  I do not feel that this is related to extra or intracranial disease.  We will see him  again in 1 year with repeat carotid duplex    Rosetta Posner, MD FACS [facility] [phone]  Note: Portions of this report may have been transcribed using voice recognition software.  Every effort has been made to ensure accuracy;  however, inadvertent computerized transcription errors may still be present.

## 2021-02-11 ENCOUNTER — Ambulatory Visit: Payer: Medicare HMO | Admitting: Physician Assistant

## 2021-02-11 ENCOUNTER — Encounter: Payer: Self-pay | Admitting: Physician Assistant

## 2021-02-11 DIAGNOSIS — L570 Actinic keratosis: Secondary | ICD-10-CM | POA: Diagnosis not present

## 2021-02-11 DIAGNOSIS — D485 Neoplasm of uncertain behavior of skin: Secondary | ICD-10-CM

## 2021-02-11 DIAGNOSIS — C44619 Basal cell carcinoma of skin of left upper limb, including shoulder: Secondary | ICD-10-CM

## 2021-02-11 DIAGNOSIS — D0439 Carcinoma in situ of skin of other parts of face: Secondary | ICD-10-CM

## 2021-02-11 DIAGNOSIS — Z1283 Encounter for screening for malignant neoplasm of skin: Secondary | ICD-10-CM | POA: Diagnosis not present

## 2021-02-11 DIAGNOSIS — C4491 Basal cell carcinoma of skin, unspecified: Secondary | ICD-10-CM

## 2021-02-11 DIAGNOSIS — Z85828 Personal history of other malignant neoplasm of skin: Secondary | ICD-10-CM | POA: Diagnosis not present

## 2021-02-11 DIAGNOSIS — C4492 Squamous cell carcinoma of skin, unspecified: Secondary | ICD-10-CM

## 2021-02-11 HISTORY — DX: Squamous cell carcinoma of skin, unspecified: C44.92

## 2021-02-11 HISTORY — DX: Basal cell carcinoma of skin, unspecified: C44.91

## 2021-02-11 NOTE — Progress Notes (Signed)
   Follow-Up Visit   Subjective  Jeremy [name] is a 75 y.o. male who presents for the following: Annual Exam (Patient here today for yearly skin check. Per patient he has a lesion on his left hand x 6 months no bleeding, no pain. Personal history of non mole skin cancer. No personal history of atypical moles, or melanoma. No family history of atypical moles, melanoma or non mole skin cancer. ).   The following portions of the chart were reviewed this encounter and updated as appropriate:  Tobacco  Allergies  Meds  Problems  Med Hx  Surg Hx  Fam Hx      Objective  Well appearing patient in no apparent distress; mood and affect are within normal limits.  All skin waist up examined.  Left Forearm - Posterior, Left Temple, Mid Parietal Scalp, Right Forearm - Posterior, Right Parotid Area Erythematous patches with gritty scale.     Left Dorsal Hand Smooth pink plaque.       Right Temple Hyperkeratotic scale with pink base         Assessment & Plan  AK (actinic keratosis) (5) Left Forearm - Posterior; Right Forearm - Posterior; Mid Parietal Scalp; Left Temple; Right Parotid Area  Destruction of lesion - Left Forearm - Posterior, Left Temple, Mid Parietal Scalp, Right Forearm - Posterior, Right Parotid Area Complexity: simple   Destruction method: cryotherapy   Informed consent: discussed and consent obtained   Timeout:  patient name, date of birth, surgical site, and procedure verified Lesion destroyed using liquid nitrogen: Yes   Cryotherapy cycles:  1 Outcome: patient tolerated procedure well with no complications   Post-procedure details: wound care instructions given    Neoplasm of uncertain behavior of skin (2) Left Dorsal Hand  Skin / nail biopsy Type of biopsy: tangential   Informed consent: discussed and consent obtained   Timeout: patient name, date of birth, surgical site, and procedure verified   Procedure prep:  Patient was prepped and draped  in usual sterile fashion (Non sterile) Prep type:  Chlorhexidine Anesthesia: the lesion was anesthetized in a standard fashion   Anesthetic:  1% lidocaine w/ epinephrine 1-100,000 local infiltration Instrument used: flexible razor blade   Hemostasis achieved with: aluminum chloride   Outcome: patient tolerated procedure well   Post-procedure details: sterile dressing applied and wound care instructions given   Dressing type: bandage and petrolatum    Specimen 1 - Surgical pathology Differential Diagnosis: R/O BCC vs SCC  Check Margins: yes  Right Temple  Skin / nail biopsy Type of biopsy: tangential   Informed consent: discussed and consent obtained   Timeout: patient name, date of birth, surgical site, and procedure verified   Procedure prep:  Patient was prepped and draped in usual sterile fashion (Non sterile) Prep type:  Chlorhexidine Anesthesia: the lesion was anesthetized in a standard fashion   Anesthetic:  1% lidocaine w/ epinephrine 1-100,000 local infiltration Instrument used: flexible razor blade   Hemostasis achieved with: aluminum chloride   Outcome: patient tolerated procedure well   Post-procedure details: sterile dressing applied and wound care instructions given   Dressing type: bandage and petrolatum    Specimen 2 - Surgical pathology Differential Diagnosis: R/O BCC vs SCC  Check Margins: No    I, Salihah Peckham, PA-C, have reviewed all documentation's for this visit.  The documentation on 02/11/21 for the exam, diagnosis, procedures and orders are all accurate and complete.

## 2021-02-11 NOTE — Patient Instructions (Signed)

## 2021-02-18 ENCOUNTER — Telehealth: Payer: Self-pay

## 2021-02-18 NOTE — Telephone Encounter (Signed)
Path to patient surgery made 03/03/21

## 2021-02-18 NOTE — Telephone Encounter (Signed)
-----   Message from Warren Danes, [place] sent at 02/16/2021  6:19 PM EDT ----- Darryl Nestle the hand lesion and ed&C  the temple at suture removal appointment

## 2021-02-19 DIAGNOSIS — I10 Essential (primary) hypertension: Secondary | ICD-10-CM | POA: Diagnosis not present

## 2021-02-19 DIAGNOSIS — E7800 Pure hypercholesterolemia, unspecified: Secondary | ICD-10-CM | POA: Diagnosis not present

## 2021-02-22 ENCOUNTER — Encounter (HOSPITAL_COMMUNITY): Payer: Self-pay

## 2021-02-22 ENCOUNTER — Encounter (HOSPITAL_COMMUNITY)
Admission: RE | Admit: 2021-02-22 | Discharge: 2021-02-22 | Disposition: A | Discharge status: Home / Self Care | Payer: Medicare HMO | Source: Ambulatory Visit | Attending: Ophthalmology | Admitting: Ophthalmology

## 2021-02-22 ENCOUNTER — Other Ambulatory Visit: Payer: Self-pay

## 2021-02-23 NOTE — H&P (Addendum)
Surgical History & Physical  Patient Name: Jeremy [name] DOB: Jun 21, 1945  Surgery: Cataract extraction with intraocular lens implant phacoemulsification; Left Eye  Surgeon: Baruch Goldmann MD Surgery Date:  03-12-2021 Pre-Op Date:  02-08-2021  HPI: A 80 Yr. old male patient - was seen in [facility] by Dr Vickki Muff. 1. 1. The patient complains of difficulty when reading fine print, books, newspaper, instructions etc., which began 6 months ago. The left eye is affected. The episode is gradual. Symptoms occur when the patient is inside and outside. This is negatively affecting the patient's quality of life. HPI Completed by Dr. Baruch Goldmann  Medical History: Heart Problem High Blood Pressure LDL Stroke    Review of Systems Negative Allergic/Immunologic Negative Cardiovascular Negative Constitutional Negative Ear, Nose, Mouth & Throat Negative Endocrine Negative Eyes Negative Gastrointestinal Negative Genitourinary Negative Hemotologic/Lymphatic Negative Integumentary Negative Musculoskeletal Negative Neurological Negative Psychiatry Negative Respiratory  Social   Former smoker   Medication Plavix, Aspirin, Isosorbide, Carvedilol, Omeprazole, Rosuvastatin, Gabapentin,   Sx/Procedures Ectropian OU, Heart Stents,   Drug Allergies  Hydrocodone,   History & Physical: Heent: Cataract, Left Eye NECK: supple without bruits LUNGS: lungs clear to auscultation CV: regular rate and rhythm Abdomen: soft and non-tender  Impression & Plan: Assessment: 1.  NUCLEAR SCLEROSIS AGE RELATED; Both Eyes (H25.13) 2.  BLEPHARITIS; Right Upper Lid, Right Lower Lid, Left Upper Lid, Left Lower Lid (H01.001, H01.002,H01.004,H01.005) 3.  Pinguecula; Both Eyes (H11.153) 4.  DERMATOCHALASIS; Right Upper Lid, Left Upper Lid (H02.831, H02.834) 5.  OAG BORDERLINE FINDINGS LOW RISK; Left Eye (H40.012)  Plan: 1.  Cataract accounts for the patient's decreased vision. This visual impairment is not  correctable with a tolerable change in glasses or contact lenses. Cataract surgery with an implantation of a new lens should significantly improve the visual and functional status of the patient. Discussed all risks, benefits, alternatives, and potential complications. Discussed the procedures and recovery. Patient desires to have surgery. A-scan ordered and performed today for intra-ocular lens calculations. The surgery will be performed in order to improve vision for driving, reading, and for eye examinations. Recommend phacoemulsification with intra-ocular lens. Recommend Dextenza for post-operative pain and inflammation. Left Eye worse - first. Dilates well - shugarcaine by protocol.  2.  Recommend regular lid cleaning.  3.  Observe; Artificial tears as needed for irritation.  4.  Asymptomatic, recommend observation for now. Findings, prognosis and treatment options reviewed.  5.  Findings, prognosis and treatment options reviewed. Based on cup-to-disc ratio. Negative Family history. OCT rNFL shows: thinning OS. Detailed discussion about glaucoma today including importance of maintaining good follow up and following treatment plan, and the possibility of irreversible blindness as part of this disease process. Sincerely,

## 2021-03-03 ENCOUNTER — Other Ambulatory Visit: Payer: Self-pay

## 2021-03-03 ENCOUNTER — Encounter: Payer: Self-pay | Admitting: Physician Assistant

## 2021-03-03 ENCOUNTER — Ambulatory Visit (INDEPENDENT_AMBULATORY_CARE_PROVIDER_SITE_OTHER): Payer: Medicare HMO | Admitting: Physician Assistant

## 2021-03-03 DIAGNOSIS — C44619 Basal cell carcinoma of skin of left upper limb, including shoulder: Secondary | ICD-10-CM

## 2021-03-03 NOTE — Patient Instructions (Signed)

## 2021-03-05 ENCOUNTER — Encounter: Payer: Self-pay | Admitting: Physician Assistant

## 2021-03-05 NOTE — Progress Notes (Signed)
   Follow-Up Visit   Subjective  Jeremy [name] is a 75 y.o. male who presents for the following: Procedure (Bcc and scc ). Healed well.    The following portions of the chart were reviewed this encounter and updated as appropriate:  Tobacco  Allergies  Meds  Problems  Med Hx  Surg Hx  Fam Hx      Objective  Well appearing patient in no apparent distress; mood and affect are within normal limits.  A full examination was performed including scalp, head, eyes, ears, nose, lips, neck, chest, axillae, abdomen, back, buttocks, bilateral upper extremities, bilateral lower extremities, hands, feet, fingers, toes, fingernails, and toenails. All findings within normal limits unless otherwise noted below.  Left Dorsal Hand Pink macule.   Assessment & Plan  Basal cell carcinoma of skin of left upper limb, including shoulder Left Dorsal Hand  Skin excision  Margin per side (cm):  0.2 Total excision diameter (cm):  4 Informed consent: discussed and consent obtained   Timeout: patient name, date of birth, surgical site, and procedure verified   Anesthesia: the lesion was anesthetized in a standard fashion   Anesthetic:  1% lidocaine w/ epinephrine 1-100,000 local infiltration Instrument used: #15 blade   Hemostasis achieved with: pressure and electrodesiccation   Outcome: patient tolerated procedure well with no complications   Post-procedure details: sterile dressing applied and wound care instructions given   Dressing type: bandage, petrolatum and pressure dressing   Additional details:  4-0 vicryl x 3 4-0 Ethilon x 1-running  Specimen 1 - Surgical pathology Differential Diagnosis: bcc [id number] Proximal Margin stained  Check Margins: No    I, Min Tunnell, PA-C, have reviewed all documentation's for this visit.  The documentation on 03/05/21 for the exam, diagnosis, procedures and orders are all accurate and complete.

## 2021-03-08 ENCOUNTER — Other Ambulatory Visit: Payer: Self-pay

## 2021-03-08 ENCOUNTER — Encounter (HOSPITAL_COMMUNITY)
Admission: RE | Admit: 2021-03-08 | Discharge: 2021-03-08 | Disposition: A | Discharge status: Home / Self Care | Payer: Medicare HMO | Source: Ambulatory Visit | Attending: Ophthalmology | Admitting: Ophthalmology

## 2021-03-08 DIAGNOSIS — H2512 Age-related nuclear cataract, left eye: Secondary | ICD-10-CM | POA: Diagnosis not present

## 2021-03-12 ENCOUNTER — Ambulatory Visit (HOSPITAL_COMMUNITY): Payer: Medicare HMO | Admitting: Anesthesiology

## 2021-03-12 ENCOUNTER — Ambulatory Visit (HOSPITAL_COMMUNITY)
Admission: RE | Admit: 2021-03-12 | Discharge: 2021-03-12 | Disposition: A | Discharge status: Home / Self Care | Payer: Medicare HMO | Attending: Ophthalmology | Admitting: Ophthalmology

## 2021-03-12 ENCOUNTER — Encounter (HOSPITAL_COMMUNITY): Payer: Self-pay | Admitting: Ophthalmology

## 2021-03-12 ENCOUNTER — Encounter (HOSPITAL_COMMUNITY)
Admission: RE | Disposition: A | Discharge status: Home / Self Care | Payer: Self-pay | Source: Home / Self Care | Attending: Ophthalmology

## 2021-03-12 DIAGNOSIS — Z87891 Personal history of nicotine dependence: Secondary | ICD-10-CM | POA: Diagnosis not present

## 2021-03-12 DIAGNOSIS — Z885 Allergy status to narcotic agent status: Secondary | ICD-10-CM | POA: Insufficient documentation

## 2021-03-12 DIAGNOSIS — H40012 Open angle with borderline findings, low risk, left eye: Secondary | ICD-10-CM | POA: Insufficient documentation

## 2021-03-12 DIAGNOSIS — Z955 Presence of coronary angioplasty implant and graft: Secondary | ICD-10-CM | POA: Diagnosis not present

## 2021-03-12 DIAGNOSIS — H2513 Age-related nuclear cataract, bilateral: Secondary | ICD-10-CM | POA: Insufficient documentation

## 2021-03-12 DIAGNOSIS — H02831 Dermatochalasis of right upper eyelid: Secondary | ICD-10-CM | POA: Insufficient documentation

## 2021-03-12 DIAGNOSIS — H2181 Floppy iris syndrome: Secondary | ICD-10-CM | POA: Insufficient documentation

## 2021-03-12 DIAGNOSIS — H0100B Unspecified blepharitis left eye, upper and lower eyelids: Secondary | ICD-10-CM | POA: Insufficient documentation

## 2021-03-12 DIAGNOSIS — H11153 Pinguecula, bilateral: Secondary | ICD-10-CM | POA: Insufficient documentation

## 2021-03-12 DIAGNOSIS — Z7902 Long term (current) use of antithrombotics/antiplatelets: Secondary | ICD-10-CM | POA: Insufficient documentation

## 2021-03-12 DIAGNOSIS — Z79899 Other long term (current) drug therapy: Secondary | ICD-10-CM | POA: Insufficient documentation

## 2021-03-12 DIAGNOSIS — I25110 Atherosclerotic heart disease of native coronary artery with unstable angina pectoris: Secondary | ICD-10-CM | POA: Diagnosis not present

## 2021-03-12 DIAGNOSIS — H0100A Unspecified blepharitis right eye, upper and lower eyelids: Secondary | ICD-10-CM | POA: Insufficient documentation

## 2021-03-12 DIAGNOSIS — Z7982 Long term (current) use of aspirin: Secondary | ICD-10-CM | POA: Diagnosis not present

## 2021-03-12 DIAGNOSIS — H02834 Dermatochalasis of left upper eyelid: Secondary | ICD-10-CM | POA: Diagnosis not present

## 2021-03-12 DIAGNOSIS — Z8673 Personal history of transient ischemic attack (TIA), and cerebral infarction without residual deficits: Secondary | ICD-10-CM | POA: Diagnosis not present

## 2021-03-12 DIAGNOSIS — H2512 Age-related nuclear cataract, left eye: Secondary | ICD-10-CM | POA: Diagnosis not present

## 2021-03-12 HISTORY — PX: CATARACT EXTRACTION W/PHACO: SHX586

## 2021-03-12 SURGERY — PHACOEMULSIFICATION, CATARACT, WITH IOL INSERTION
Anesthesia: Monitor Anesthesia Care | Laterality: Left

## 2021-03-12 MED ORDER — PHENYLEPHRINE-KETOROLAC 1-0.3 % IO SOLN
INTRAOCULAR | Status: AC
  Filled 2021-03-12: qty 4

## 2021-03-12 MED ORDER — MIDAZOLAM HCL 2 MG/2ML IJ SOLN
INTRAMUSCULAR | Status: AC
  Filled 2021-03-12: qty 2

## 2021-03-12 MED ORDER — MIDAZOLAM HCL 5 MG/5ML IJ SOLN
INTRAMUSCULAR | Status: DC | PRN
Start: 2021-03-12 — End: 2021-03-12
  Administered 2021-03-12: 2 mg via INTRAVENOUS

## 2021-03-12 MED ORDER — TETRACAINE HCL 0.5 % OP SOLN
1.0000 [drp] | OPHTHALMIC | Status: AC | PRN
  Administered 2021-03-12 (×3): 1 [drp] via OPHTHALMIC

## 2021-03-12 MED ORDER — STERILE WATER FOR IRRIGATION IR SOLN
Status: DC | PRN
  Administered 2021-03-12: 250 mL

## 2021-03-12 MED ORDER — NEOMYCIN-POLYMYXIN-DEXAMETH 3.5-10000-0.1 OP SUSP
OPHTHALMIC | Status: DC | PRN
  Administered 2021-03-12: 1 [drp] via OPHTHALMIC

## 2021-03-12 MED ORDER — SODIUM HYALURONATE 23MG/ML IO SOSY
PREFILLED_SYRINGE | INTRAOCULAR | Status: DC | PRN
  Administered 2021-03-12: 0.6 mL via INTRAOCULAR

## 2021-03-12 MED ORDER — EPINEPHRINE PF 1 MG/ML IJ SOLN
INTRAMUSCULAR | Status: AC
  Filled 2021-03-12: qty 1

## 2021-03-12 MED ORDER — BSS IO SOLN
INTRAOCULAR | Status: DC | PRN
  Administered 2021-03-12: 15 mL via INTRAOCULAR

## 2021-03-12 MED ORDER — PHENYLEPHRINE-KETOROLAC 1-0.3 % IO SOLN
INTRAOCULAR | Status: DC | PRN
  Administered 2021-03-12: 500 mL via OPHTHALMIC

## 2021-03-12 MED ORDER — SODIUM CHLORIDE 0.9% FLUSH
INTRAVENOUS | Status: DC | PRN
  Administered 2021-03-12: 10 mL via INTRAVENOUS

## 2021-03-12 MED ORDER — TROPICAMIDE 1 % OP SOLN
1.0000 [drp] | OPHTHALMIC | Status: AC | PRN
  Administered 2021-03-12 (×3): 1 [drp] via OPHTHALMIC

## 2021-03-12 MED ORDER — LIDOCAINE HCL 3.5 % OP GEL
1.0000 "application " | Freq: Once | OPHTHALMIC | Status: AC
  Administered 2021-03-12: 1 via OPHTHALMIC

## 2021-03-12 MED ORDER — LIDOCAINE HCL (PF) 1 % IJ SOLN
INTRAOCULAR | Status: DC | PRN
  Administered 2021-03-12: 1 mL via OPHTHALMIC

## 2021-03-12 MED ORDER — SODIUM HYALURONATE 10 MG/ML IO SOLUTION
PREFILLED_SYRINGE | INTRAOCULAR | Status: DC | PRN
  Administered 2021-03-12: 0.85 mL via INTRAOCULAR

## 2021-03-12 MED ORDER — PHENYLEPHRINE HCL 2.5 % OP SOLN
1.0000 [drp] | OPHTHALMIC | Status: AC | PRN
  Administered 2021-03-12 (×3): 1 [drp] via OPHTHALMIC

## 2021-03-12 MED ORDER — POVIDONE-IODINE 5 % OP SOLN
OPHTHALMIC | Status: DC | PRN
  Administered 2021-03-12: 1 via OPHTHALMIC

## 2021-03-12 SURGICAL SUPPLY — 13 items
CLOTH BEACON ORANGE TIMEOUT ST (SAFETY) ×1 IMPLANT
EYE SHIELD UNIVERSAL CLEAR (GAUZE/BANDAGES/DRESSINGS) ×1 IMPLANT
GLOVE SURG UNDER POLY LF SZ6.5 (GLOVE) ×1 IMPLANT
GLOVE SURG UNDER POLY LF SZ7 (GLOVE) ×1 IMPLANT
NDL HYPO 18GX1.5 BLUNT FILL (NEEDLE) IMPLANT
NEEDLE HYPO 18GX1.5 BLUNT FILL (NEEDLE) ×2 IMPLANT
PAD ARMBOARD 7.5X6 YLW CONV (MISCELLANEOUS) ×1 IMPLANT
RAYONE EMV US (Intraocular Lens) ×1 IMPLANT
RING MALYGIN 7.0 (MISCELLANEOUS) IMPLANT
SYR TB 1ML LL NO SAFETY (SYRINGE) ×1 IMPLANT
TAPE SURG TRANSPORE 1 IN (GAUZE/BANDAGES/DRESSINGS) IMPLANT
TAPE SURGICAL TRANSPORE 1 IN (GAUZE/BANDAGES/DRESSINGS) ×2
WATER STERILE IRR 250ML POUR (IV SOLUTION) ×1 IMPLANT

## 2021-03-12 NOTE — Interval H&P Note (Signed)
History and Physical Interval Note:  03/12/2021 11:19 AM  Jeremy [name]  has presented today for surgery, with the diagnosis of Nuclear sclerotic cataract - Left eye.  The various methods of treatment have been discussed with the patient and family. After consideration of risks, benefits and other options for treatment, the patient has consented to  Procedure(s) with comments: CATARACT EXTRACTION PHACO AND INTRAOCULAR LENS PLACEMENT ([facility]) (Left) - left as a surgical intervention.  The patient's history has been reviewed, patient examined, no change in status, stable for surgery.  I have reviewed the patient's chart and labs.  Questions were answered to the patient's satisfaction.     Baruch Goldmann

## 2021-03-12 NOTE — Op Note (Signed)
Date of procedure: 03/12/21  Pre-operative diagnosis: Visually significant nuclear age-related cataract, Left Eye; Poor dilation, Left eye (H25.12; H21.81)  Post-operative diagnosis: Visually significant cataract, Left Eye; Intra-operative Floppy Iris Syndrome, Left Eye  Procedure: Complex removal of cataract via phacoemulsification and insertion of intra-ocular lens Rayner [id number] +21.0D into the capsular bag of the Left Eye (CPT [phone])  Attending surgeon: Gerda Diss. Julianah Marciel, MD, MA  Anesthesia: MAC, Topical Akten  Complications: None  Estimated Blood Loss: <51m (minimal)  Specimens: None  Implants: As above  Indications:  Visually significant cataract, Left Eye  Procedure:  The patient was seen and identified in the pre-operative area. The operative eye was identified and dilated.  The operative eye was marked.  Topical anesthesia was administered to the operative eye.     The patient was then to the operative suite and placed in the supine position.  A timeout was performed confirming the patient, procedure to be performed, and all other relevant information.   The patient's face was prepped and draped in the usual fashion for intra-ocular surgery.  A lid speculum was placed into the operative eye and the surgical microscope moved into place and focused.  Poor dilation of the iris was confirmed.  An inferotemporal paracentesis was created using a 20 gauge paracentesis blade.  Shugarcaine was injected into the anterior chamber.  Viscoelastic was injected into the anterior chamber.  A temporal clear-corneal main wound incision was created using a 2.436mmicrokeratome.  A Malyugin ring was placed.  A continuous curvilinear capsulorrhexis was initiated using an irrigating cystitome and completed using capsulorrhexis forceps.  Hydrodissection and hydrodeliniation were performed.  Viscoelastic was injected into the anterior chamber.  A phacoemulsification handpiece and a chopper as a second  instrument were used to remove the nucleus and epinucleus. The irrigation/aspiration handpiece was used to remove any remaining cortical material.   The capsular bag was reinflated with viscoelastic, checked, and found to be intact.  The intraocular lens was inserted into the capsular bag and dialed into place using a MaSurveyor, mineralsThe Malyugin ring was removed.  The irrigation/aspiration handpiece was used to remove any remaining viscoelastic.  The clear corneal wound and paracentesis wounds were then hydrated and checked with Weck-Cels to be watertight.  The lid-speculum and drape was removed, and the patient's face was cleaned with a wet and dry 4x4.  Maxitrol was instilled in the eye before a clear shield was taped over the eye. The patient was taken to the post-operative care unit in good condition, having tolerated the procedure well.  Post-Op Instructions: [facility] and will receive all other orders and instructions.

## 2021-03-12 NOTE — Anesthesia Postprocedure Evaluation (Signed)
Anesthesia Post Note  Patient: Jeremy [name]  Procedure(s) Performed: CATARACT EXTRACTION PHACO AND INTRAOCULAR LENS PLACEMENT ([facility]) (Left)  Patient location during evaluation: Short Stay Anesthesia Type: MAC Level of consciousness: awake and alert Pain management: pain level controlled Vital Signs Assessment: post-procedure vital signs reviewed and stable Respiratory status: spontaneous breathing Cardiovascular status: blood pressure returned to baseline and stable Postop Assessment: no apparent nausea or vomiting Anesthetic complications: no   No notable events documented.   Last Vitals:  Vitals:   03/12/21 1013 03/12/21 1122  BP: (!) 141/84 116/68  Pulse: 72 71  Resp: 16 18  Temp: 36.7 C (!) 36.1 C  SpO2: 95% 98%    Last Pain:  Vitals:   03/12/21 1122  TempSrc: Oral  PainSc: 0-No pain                 Kevion Fatheree

## 2021-03-12 NOTE — Anesthesia Preprocedure Evaluation (Signed)
Anesthesia Evaluation  Patient identified by MRN, date of birth, ID band Patient awake    Reviewed: Allergy & Precautions, NPO status , Patient's Chart, lab work & pertinent test results, reviewed documented beta blocker date and time   History of Anesthesia Complications Negative for: history of anesthetic complications  Airway Mallampati: III  TM Distance: >3 FB Neck ROM: Full    Dental  (+) Dental Advisory Given   Pulmonary former smoker,    Pulmonary exam normal breath sounds clear to auscultation       Cardiovascular Exercise Tolerance: Good hypertension, Pt. on medications and Pt. on home beta blockers + angina + CAD, + Past MI and + Cardiac Stents  Normal cardiovascular exam Rhythm:Regular Rate:Normal  20-Sep-2020 05:22:08 [facility] ROUTINE RECORD 23-Mar-1946 (74 yr) Male Caucasian Vent. rate 69 BPM PR interval 236 ms QRS duration 124 ms QT/QTcB 412/441 ms P-R-T axes -5 -23 121 Sinus rhythm with 1st degree A-V block Minimal voltage criteria for LVH, may be normal variant ( Cornell product ) Possible Lateral infarct , age undetermined Abnormal ECG since last tracing no significant change Confirmed by Varanasi, Jayadeep ([id number]) on 09/20/2020 9:15:16 PM   Neuro/Psych Seizures -,   Neuromuscular disease CVA negative psych ROS   GI/Hepatic Neg liver ROS, GERD  Medicated and Controlled,  Endo/Other  negative endocrine ROS  Renal/GU Renal InsufficiencyRenal disease     Musculoskeletal  (+) Arthritis ,   Abdominal   Peds  Hematology negative hematology ROS (+)   Anesthesia Other Findings   Reproductive/Obstetrics negative OB ROS                             Anesthesia Physical  Anesthesia Plan  ASA: 3  Anesthesia Plan: MAC   Post-op Pain Management:    Induction:   PONV Risk Score and Plan:   Airway Management Planned: Nasal Cannula and Natural  Airway  Additional Equipment:   Intra-op Plan:   Post-operative Plan:   Informed Consent: I have reviewed the patients History and Physical, chart, labs and discussed the procedure including the risks, benefits and alternatives for the proposed anesthesia with the patient or authorized representative who has indicated his/her understanding and acceptance.     Dental advisory given  Plan Discussed with: CRNA and Surgeon  Anesthesia Plan Comments:         Anesthesia Quick Evaluation  

## 2021-03-12 NOTE — Transfer of Care (Signed)
Immediate Anesthesia Transfer of Care Note  Patient: Jeremy [name]  Procedure(s) Performed: CATARACT EXTRACTION PHACO AND INTRAOCULAR LENS PLACEMENT ([facility]) (Left)  Patient Location: Short Stay  Anesthesia Type:MAC  Level of Consciousness: awake  Airway & Oxygen Therapy: Patient Spontanous Breathing  Post-op Assessment: Report given to RN  Post vital signs: Reviewed  Last Vitals:  Vitals Value Taken Time  BP    Temp    Pulse    Resp    SpO2      Last Pain:  Vitals:   03/12/21 1122  TempSrc: Oral  PainSc: 0-No pain         Complications: No notable events documented.

## 2021-03-12 NOTE — Discharge Instructions (Signed)
Please discharge patient when stable, will follow up today with Dr. Rylyn Zawistowski at the [facility] [place] office immediately following discharge.  Leave shield in place until visit.  All paperwork with discharge instructions will be given at the office.  [facility] Address:  [address]  [place], [place] [address]  

## 2021-03-15 ENCOUNTER — Encounter (HOSPITAL_COMMUNITY): Payer: Self-pay | Admitting: Ophthalmology

## 2021-03-15 ENCOUNTER — Telehealth: Payer: Self-pay | Admitting: *Deleted

## 2021-03-15 NOTE — Telephone Encounter (Signed)
-----   Message from Warren Danes, [place] sent at 03/15/2021 11:19 AM EDT ----- Margins clear. RTC 3 months

## 2021-03-15 NOTE — Telephone Encounter (Signed)
Path to patient. Patient has another surgery appointment will make 3 month follow up at that time.

## 2021-03-17 ENCOUNTER — Ambulatory Visit (INDEPENDENT_AMBULATORY_CARE_PROVIDER_SITE_OTHER): Payer: Medicare HMO | Admitting: *Deleted

## 2021-03-17 ENCOUNTER — Other Ambulatory Visit: Payer: Self-pay

## 2021-03-17 DIAGNOSIS — Z4802 Encounter for removal of sutures: Secondary | ICD-10-CM

## 2021-03-17 NOTE — Progress Notes (Signed)
Here for suture removal- no sign or symptoms of infection. Pathology to patient.

## 2021-04-01 ENCOUNTER — Other Ambulatory Visit: Payer: Self-pay

## 2021-04-01 ENCOUNTER — Encounter (HOSPITAL_COMMUNITY)
Admission: RE | Admit: 2021-04-01 | Discharge: 2021-04-01 | Disposition: A | Discharge status: Home / Self Care | Payer: Medicare HMO | Source: Ambulatory Visit | Attending: Ophthalmology | Admitting: Ophthalmology

## 2021-04-01 DIAGNOSIS — H25811 Combined forms of age-related cataract, right eye: Secondary | ICD-10-CM | POA: Diagnosis not present

## 2021-04-01 DIAGNOSIS — M1990 Unspecified osteoarthritis, unspecified site: Secondary | ICD-10-CM | POA: Diagnosis not present

## 2021-04-01 DIAGNOSIS — J302 Other seasonal allergic rhinitis: Secondary | ICD-10-CM | POA: Diagnosis not present

## 2021-04-02 NOTE — H&P (Signed)
Surgical History & Physical  Patient Name: Jeremy [name] DOB: [birth year]  Surgery: Cataract extraction with intraocular lens implant phacoemulsification; Right Eye  Surgeon: Baruch Goldmann MD Surgery Date:  04-08-21 Pre-Op Date:  03-18-21  HPI: A 68 Yr. old male patient 1. The patient is returning after cataract post-op. The left eye is affected. Status post cataract post-op, which began 1 weeks ago: Since the last visit, the affected area is doing well. The patient's vision is improved. While the vision has improved OS, the patient states there is a slight blue tint in the center of his vision. Patient is following medication instructions, the patient is using the post op drop TID OS. The patient experiences no eye pain and no flashes, floater, shadow, curtain or veil. The patient does complain of frequent tearing OU, especially OS. The patient has not been using any artificial tears. The patient also presents for pre op OD, and states that his vision is still blurry in that eye making it difficult to read fine print on medicine bottles, and when watching the television, his vision will go in and out of focus making it difficult to read the captions. The patient also states that vision is negatively affecting him when he is driving due to the glare from the sun/ headlights.  Medical History: Heart Problem High Blood Pressure LDL Stroke  Review of Systems Negative Allergic/Immunologic Negative Cardiovascular Negative Constitutional Negative Ear, Nose, Mouth & Throat Negative Endocrine Negative Eyes Negative Gastrointestinal Negative Genitourinary Negative Hemotologic/Lymphatic Negative Integumentary Negative Musculoskeletal Negative Neurological Negative Psychiatry Negative Respiratory  Social   Former smoker   Medication Prednisolone-Moxifloxacin-Bromfenac, Plavix, Aspirin, Isosorbide, Carvedilol, Omeprazole, Rosuvastatin, Gabapentin,   Sx/Procedures Ectropian OU, Phaco c IOL  OS, Heart Stents,   Drug Allergies  Hydrocodone,   History & Physical: Heent: Cataract, Right Eye NECK: supple without bruits LUNGS: lungs clear to auscultation CV: regular rate and rhythm Abdomen: soft and non-tender  Impression & Plan: Assessment: 1.  CATARACT EXTRACTION STATUS; Left Eye (Z98.42) 2.  INTRAOCULAR LENS IOL ; Left Eye (Z96.1) 3.  NUCLEAR SCLEROSIS AGE RELATED; Right Eye (H25.11) 4.  Hyperopia ; Right Eye (H52.01) 5.  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  1 week after cataract surgery. Doing well with improved vision and normal eye pressure. Call with any problems or concerns. Continue Pred-Moxi-Brom 2x/day for 3 more weeks.  2.  Doing well since surgery Continue Post-op medications  3.  Cataract accounts for the patient's decreased vision. This visual impairment is not correctable with a tolerable change in glasses or contact lenses. Cataract surgery with an implantation of a new lens should significantly improve the visual and functional status of the patient. Discussed all risks, benefits, alternatives, and potential complications. Discussed the procedures and recovery. Patient desires to have surgery. A-scan ordered and performed today for intra-ocular lens calculations. The surgery will be performed in order to improve vision for driving, reading, and for eye examinations. Recommend phacoemulsification with intra-ocular lens. Recommend Dextenza for post-operative pain and inflammation. Right eye. Dilates well - shugarcaine by protocol. Surgery required to correct imbalance of vision.  4.   5.  From ectropion. Will refer to oculoplastics after cataract surgery.

## 2021-04-07 ENCOUNTER — Other Ambulatory Visit: Payer: Self-pay

## 2021-04-07 ENCOUNTER — Encounter: Payer: Self-pay | Admitting: Physician Assistant

## 2021-04-07 ENCOUNTER — Ambulatory Visit (INDEPENDENT_AMBULATORY_CARE_PROVIDER_SITE_OTHER): Payer: Medicare HMO | Admitting: Physician Assistant

## 2021-04-07 DIAGNOSIS — D0439 Carcinoma in situ of skin of other parts of face: Secondary | ICD-10-CM

## 2021-04-07 DIAGNOSIS — D0430 Carcinoma in situ of skin of unspecified part of face: Secondary | ICD-10-CM

## 2021-04-07 NOTE — Progress Notes (Signed)
   Follow-Up Visit   Subjective  Jeremy [name] is a 75 y.o. male who presents for the following: Procedure (Scc right temple).   The following portions of the chart were reviewed this encounter and updated as appropriate:  Tobacco  Allergies  Meds  Problems  Med Hx  Surg Hx  Fam Hx      Objective  Well appearing patient in no apparent distress; mood and affect are within normal limits.  A focused examination was performed including hands and face. Relevant physical exam findings are noted in the Assessment and Plan.  Right Temple Pink macule   Assessment & Plan  Carcinoma in situ of skin of face, unspecified location Right Temple  Defer surgery due to scheduled cataract surgery tomorrow.  Reschedule ED &C.    I, Perle Brickhouse, PA-C, have reviewed all documentation's for this visit.  The documentation on 04/07/21 for the exam, diagnosis, procedures and orders are all accurate and complete.

## 2021-04-08 ENCOUNTER — Encounter (HOSPITAL_COMMUNITY)
Admission: RE | Disposition: A | Discharge status: Home / Self Care | Payer: Self-pay | Source: Home / Self Care | Attending: Ophthalmology

## 2021-04-08 ENCOUNTER — Encounter (HOSPITAL_COMMUNITY): Payer: Self-pay | Admitting: Ophthalmology

## 2021-04-08 ENCOUNTER — Ambulatory Visit (HOSPITAL_COMMUNITY)
Admission: RE | Admit: 2021-04-08 | Discharge: 2021-04-08 | Disposition: A | Discharge status: Home / Self Care | Payer: Medicare HMO | Attending: Ophthalmology | Admitting: Ophthalmology

## 2021-04-08 ENCOUNTER — Ambulatory Visit (HOSPITAL_COMMUNITY): Payer: Medicare HMO | Admitting: Certified Registered"

## 2021-04-08 DIAGNOSIS — I10 Essential (primary) hypertension: Secondary | ICD-10-CM | POA: Insufficient documentation

## 2021-04-08 DIAGNOSIS — I252 Old myocardial infarction: Secondary | ICD-10-CM | POA: Insufficient documentation

## 2021-04-08 DIAGNOSIS — H2511 Age-related nuclear cataract, right eye: Secondary | ICD-10-CM | POA: Diagnosis not present

## 2021-04-08 DIAGNOSIS — G40909 Epilepsy, unspecified, not intractable, without status epilepticus: Secondary | ICD-10-CM | POA: Diagnosis not present

## 2021-04-08 DIAGNOSIS — Z955 Presence of coronary angioplasty implant and graft: Secondary | ICD-10-CM | POA: Diagnosis not present

## 2021-04-08 DIAGNOSIS — I2510 Atherosclerotic heart disease of native coronary artery without angina pectoris: Secondary | ICD-10-CM | POA: Insufficient documentation

## 2021-04-08 DIAGNOSIS — Z87891 Personal history of nicotine dependence: Secondary | ICD-10-CM | POA: Insufficient documentation

## 2021-04-08 DIAGNOSIS — I25119 Atherosclerotic heart disease of native coronary artery with unspecified angina pectoris: Secondary | ICD-10-CM | POA: Diagnosis not present

## 2021-04-08 HISTORY — PX: CATARACT EXTRACTION W/PHACO: SHX586

## 2021-04-08 SURGERY — PHACOEMULSIFICATION, CATARACT, WITH IOL INSERTION
Anesthesia: Monitor Anesthesia Care | Site: Eye | Laterality: Right

## 2021-04-08 MED ORDER — TETRACAINE HCL 0.5 % OP SOLN
1.0000 [drp] | OPHTHALMIC | Status: AC | PRN
  Administered 2021-04-08 (×3): 1 [drp] via OPHTHALMIC

## 2021-04-08 MED ORDER — SODIUM HYALURONATE 23MG/ML IO SOSY
PREFILLED_SYRINGE | INTRAOCULAR | Status: DC | PRN
  Administered 2021-04-08: 0.6 mL via INTRAOCULAR

## 2021-04-08 MED ORDER — PHENYLEPHRINE HCL 2.5 % OP SOLN
1.0000 [drp] | OPHTHALMIC | Status: AC | PRN
  Administered 2021-04-08 (×3): 1 [drp] via OPHTHALMIC

## 2021-04-08 MED ORDER — SODIUM HYALURONATE 10 MG/ML IO SOLUTION
PREFILLED_SYRINGE | INTRAOCULAR | Status: DC | PRN
  Administered 2021-04-08: 0.85 mL via INTRAOCULAR

## 2021-04-08 MED ORDER — STERILE WATER FOR IRRIGATION IR SOLN
Status: DC | PRN
  Administered 2021-04-08: 1000 mL

## 2021-04-08 MED ORDER — LIDOCAINE HCL 3.5 % OP GEL
1.0000 "application " | Freq: Once | OPHTHALMIC | Status: AC
  Administered 2021-04-08: 1 via OPHTHALMIC

## 2021-04-08 MED ORDER — TROPICAMIDE 1 % OP SOLN
1.0000 [drp] | OPHTHALMIC | Status: AC | PRN
  Administered 2021-04-08 (×3): 1 [drp] via OPHTHALMIC
  Filled 2021-04-08: qty 2

## 2021-04-08 MED ORDER — MIDAZOLAM HCL 2 MG/2ML IJ SOLN
INTRAMUSCULAR | Status: DC | PRN
  Administered 2021-04-08: 2 mg via INTRAVENOUS

## 2021-04-08 MED ORDER — MIDAZOLAM HCL 2 MG/2ML IJ SOLN
INTRAMUSCULAR | Status: AC
  Filled 2021-04-08: qty 2

## 2021-04-08 MED ORDER — NEOMYCIN-POLYMYXIN-DEXAMETH 3.5-10000-0.1 OP SUSP
OPHTHALMIC | Status: DC | PRN
  Administered 2021-04-08: 1 [drp] via OPHTHALMIC

## 2021-04-08 MED ORDER — EPINEPHRINE PF 1 MG/ML IJ SOLN
INTRAMUSCULAR | Status: AC
  Filled 2021-04-08: qty 2

## 2021-04-08 MED ORDER — BSS IO SOLN
INTRAOCULAR | Status: DC | PRN
  Administered 2021-04-08: 15 mL via INTRAOCULAR

## 2021-04-08 MED ORDER — EPINEPHRINE PF 1 MG/ML IJ SOLN
INTRAOCULAR | Status: DC | PRN
  Administered 2021-04-08: 09:00:00 500 mL

## 2021-04-08 MED ORDER — LIDOCAINE HCL (PF) 1 % IJ SOLN
INTRAOCULAR | Status: DC | PRN
  Administered 2021-04-08: 09:00:00 1 mL via OPHTHALMIC

## 2021-04-08 MED ORDER — POVIDONE-IODINE 5 % OP SOLN
OPHTHALMIC | Status: DC | PRN
  Administered 2021-04-08: 1 via OPHTHALMIC

## 2021-04-08 MED ORDER — SODIUM CHLORIDE 0.9% FLUSH
INTRAVENOUS | Status: DC | PRN
  Administered 2021-04-08: 5 mL via INTRAVENOUS

## 2021-04-08 SURGICAL SUPPLY — 11 items
CLOTH BEACON ORANGE TIMEOUT ST (SAFETY) ×1 IMPLANT
EYE SHIELD UNIVERSAL CLEAR (GAUZE/BANDAGES/DRESSINGS) ×1 IMPLANT
GLOVE SURG UNDER POLY LF SZ7 (GLOVE) ×2 IMPLANT
NDL HYPO 18GX1.5 BLUNT FILL (NEEDLE) IMPLANT
NEEDLE HYPO 18GX1.5 BLUNT FILL (NEEDLE) ×2 IMPLANT
PAD ARMBOARD 7.5X6 YLW CONV (MISCELLANEOUS) ×1 IMPLANT
RayOne EMV US (Intraocular Lens) ×1 IMPLANT
SYR TB 1ML LL NO SAFETY (SYRINGE) ×1 IMPLANT
TAPE SURG TRANSPORE 1 IN (GAUZE/BANDAGES/DRESSINGS) IMPLANT
TAPE SURGICAL TRANSPORE 1 IN (GAUZE/BANDAGES/DRESSINGS) ×2
WATER STERILE IRR 250ML POUR (IV SOLUTION) ×1 IMPLANT

## 2021-04-08 NOTE — Interval H&P Note (Signed)
History and Physical Interval Note:  04/08/2021 9:14 AM  Jeremy [name]  has presented today for surgery, with the diagnosis of nuclear cataract right eye.  The various methods of treatment have been discussed with the patient and family. After consideration of risks, benefits and other options for treatment, the patient has consented to  Procedure(s) with comments: CATARACT EXTRACTION PHACO AND INTRAOCULAR LENS PLACEMENT ([facility]) (Right) - right as a surgical intervention.  The patient's history has been reviewed, patient examined, no change in status, stable for surgery.  I have reviewed the patient's chart and labs.  Questions were answered to the patient's satisfaction.     Baruch Goldmann

## 2021-04-08 NOTE — Transfer of Care (Signed)
Immediate Anesthesia Transfer of Care Note  Patient: Jeremy [name]  Procedure(s) Performed: CATARACT EXTRACTION PHACO AND INTRAOCULAR LENS PLACEMENT RIGHT EYE (Right: Eye)  Patient Location: Short Stay  Anesthesia Type:MAC  Level of Consciousness: awake, alert  and oriented  Airway & Oxygen Therapy: Patient Spontanous Breathing  Post-op Assessment: Report given to RN, Post -op Vital signs reviewed and stable and Patient moving all extremities X 4  Post vital signs: Reviewed and stable  Last Vitals:  Vitals Value Taken Time  BP    Temp    Pulse    Resp    SpO2      Last Pain:  Vitals:   04/08/21 0834  TempSrc: Oral  PainSc: 0-No pain      Patients Stated Pain Goal: 7 (32/02/33 4356)  Complications: No notable events documented.

## 2021-04-08 NOTE — Op Note (Signed)
Date of procedure: 04/08/21  Pre-operative diagnosis: Visually significant age-related nuclear cataract, Right Eye (H25.11)  Post-operative diagnosis: Visually significant age-related nuclear cataract, Right Eye  Procedure: Removal of cataract via phacoemulsification and insertion of intra-ocular lens Rayner RAO200E +22.5D into the capsular bag of the Right Eye  Attending surgeon: Gerda Diss. Dandy Lazaro, MD, MA  Anesthesia: MAC, Topical Akten  Complications: None  Estimated Blood Loss: <77m (minimal)  Specimens: None  Implants: As above  Indications:  Visually significant age-related cataract, Right Eye  Procedure:  The patient was seen and identified in the pre-operative area. The operative eye was identified and dilated.  The operative eye was marked.  Topical anesthesia was administered to the operative eye.     The patient was then to the operative suite and placed in the supine position.  A timeout was performed confirming the patient, procedure to be performed, and all other relevant information.   The patient's face was prepped and draped in the usual fashion for intra-ocular surgery.  A lid speculum was placed into the operative eye and the surgical microscope moved into place and focused.  A superotemporal paracentesis was created using a 20 gauge paracentesis blade.  Shugarcaine was injected into the anterior chamber.  Viscoelastic was injected into the anterior chamber.  A temporal clear-corneal main wound incision was created using a 2.429mmicrokeratome.  A continuous curvilinear capsulorrhexis was initiated using an irrigating cystitome and completed using capsulorrhexis forceps.  Hydrodissection and hydrodeliniation were performed.  Viscoelastic was injected into the anterior chamber.  A phacoemulsification handpiece and a chopper as a second instrument were used to remove the nucleus and epinucleus. The irrigation/aspiration handpiece was used to remove any remaining cortical  material.   The capsular bag was reinflated with viscoelastic, checked, and found to be intact.  The intraocular lens was inserted into the capsular bag.  The irrigation/aspiration handpiece was used to remove any remaining viscoelastic. At this point a 1 clock hour zonular dehiscence was noted superotemporally.  The lens remained well centered.  No vitreous was present in the anterior chamber.  Time was taken to ensure that the lens remained stable and centered.  The clear corneal wound and paracentesis wounds were then hydrated and checked with Weck-Cels to be watertight.  The lid-speculum and drape was removed, and the patient's face was cleaned with a wet and dry 4x4.  Maxitrol was instilled in the eye before a clear shield was taped over the eye. The patient was taken to the post-operative care unit in good condition, having tolerated the procedure well.  Post-Op Instructions: [facility] and will receive all other orders and instructions.

## 2021-04-08 NOTE — Anesthesia Preprocedure Evaluation (Signed)
Anesthesia Evaluation  Patient identified by MRN, date of birth, ID band Patient awake    Reviewed: Allergy & Precautions, NPO status , Patient's Chart, lab work & pertinent test results, reviewed documented beta blocker date and time   History of Anesthesia Complications Negative for: history of anesthetic complications  Airway Mallampati: III  TM Distance: >3 FB Neck ROM: Full    Dental  (+) Dental Advisory Given   Pulmonary former smoker,    Pulmonary exam normal breath sounds clear to auscultation       Cardiovascular Exercise Tolerance: Good hypertension, Pt. on medications and Pt. on home beta blockers + angina + CAD, + Past MI and + Cardiac Stents  Normal cardiovascular exam Rhythm:Regular Rate:Normal  20-Sep-2020 05:22:08 [facility] ROUTINE RECORD [id number] (44 yr) Male Caucasian Vent. rate 69 BPM PR interval 236 ms QRS duration 124 ms QT/QTcB 412/441 ms P-R-T axes -5 -23 121 Sinus rhythm with 1st degree A-V block Minimal voltage criteria for LVH, may be normal variant ( Cornell product ) Possible Lateral infarct , age undetermined Abnormal ECG since last tracing no significant change Confirmed by Larae Grooms [phone]) on 09/20/2020 9:15:16 PM   Neuro/Psych Seizures -,   Neuromuscular disease CVA negative psych ROS   GI/Hepatic Neg liver ROS, GERD  Medicated and Controlled,  Endo/Other  negative endocrine ROS  Renal/GU Renal InsufficiencyRenal disease     Musculoskeletal  (+) Arthritis ,   Abdominal   Peds  Hematology negative hematology ROS (+)   Anesthesia Other Findings   Reproductive/Obstetrics negative OB ROS                             Anesthesia Physical  Anesthesia Plan  ASA: 3  Anesthesia Plan: MAC   Post-op Pain Management:    Induction:   PONV Risk Score and Plan:   Airway Management Planned: Nasal Cannula and Natural  Airway  Additional Equipment:   Intra-op Plan:   Post-operative Plan:   Informed Consent: I have reviewed the patients History and Physical, chart, labs and discussed the procedure including the risks, benefits and alternatives for the proposed anesthesia with the patient or authorized representative who has indicated his/her understanding and acceptance.     Dental advisory given  Plan Discussed with: CRNA and Surgeon  Anesthesia Plan Comments:         Anesthesia Quick Evaluation

## 2021-04-08 NOTE — Anesthesia Postprocedure Evaluation (Signed)
Anesthesia Post Note  Patient: Jeremy [name]  Procedure(s) Performed: CATARACT EXTRACTION PHACO AND INTRAOCULAR LENS PLACEMENT RIGHT EYE (Right: Eye)  Patient location during evaluation: Phase II Anesthesia Type: MAC Level of consciousness: awake Pain management: pain level controlled Vital Signs Assessment: post-procedure vital signs reviewed and stable Respiratory status: spontaneous breathing and respiratory function stable Cardiovascular status: blood pressure returned to baseline and stable Postop Assessment: no headache and no apparent nausea or vomiting Anesthetic complications: no Comments: Late entry   No notable events documented.   Last Vitals:  Vitals:   04/08/21 0834 04/08/21 0939  BP: (!) 164/69 131/66  Pulse:  63  Resp: (!) 26 (!) 22  Temp: 36.6 C 36.6 C  SpO2: 96% 94%    Last Pain:  Vitals:   04/08/21 0939  TempSrc: Oral  PainSc: 0-No pain                 Louann Sjogren

## 2021-04-08 NOTE — Discharge Instructions (Addendum)
Please discharge patient when stable, will follow up today with Dr. Wrzosek at the [facility] [place] office immediately following discharge.  Leave shield in place until visit.  All paperwork with discharge instructions will be given at the office.  [facility] [place] Address:  [address]  , [place] [address]  

## 2021-04-08 NOTE — Anesthesia Procedure Notes (Signed)
Procedure Name: MAC Date/Time: 04/08/2021 9:21 AM Performed by: Orlie Dakin, CRNA Pre-anesthesia Checklist: Patient identified, Emergency Drugs available, Suction available and Patient being monitored Patient Re-evaluated:Patient Re-evaluated prior to induction Oxygen Delivery Method: Nasal cannula Placement Confirmation: positive ETCO2

## 2021-04-09 ENCOUNTER — Encounter (HOSPITAL_COMMUNITY): Payer: Self-pay | Admitting: Ophthalmology

## 2021-05-10 ENCOUNTER — Ambulatory Visit (INDEPENDENT_AMBULATORY_CARE_PROVIDER_SITE_OTHER): Payer: Medicare HMO | Admitting: Physician Assistant

## 2021-05-10 ENCOUNTER — Other Ambulatory Visit: Payer: Self-pay

## 2021-05-10 ENCOUNTER — Encounter: Payer: Self-pay | Admitting: Physician Assistant

## 2021-05-10 DIAGNOSIS — L570 Actinic keratosis: Secondary | ICD-10-CM

## 2021-05-10 DIAGNOSIS — D0430 Carcinoma in situ of skin of unspecified part of face: Secondary | ICD-10-CM

## 2021-05-10 DIAGNOSIS — D0439 Carcinoma in situ of skin of other parts of face: Secondary | ICD-10-CM | POA: Diagnosis not present

## 2021-05-10 MED ORDER — TOLAK 4 % EX CREA
TOPICAL_CREAM | CUTANEOUS | 0 refills | Status: DC
Start: 2021-05-10 — End: 2021-08-18

## 2021-05-10 NOTE — Progress Notes (Signed)
° °  Follow-Up Visit   Subjective  Jeremy [name] is a 75 y.o. male who presents for the following: Procedure (Right temple scc). Spot on his neck is bothering him with shaving x few weeks.    The following portions of the chart were reviewed this encounter and updated as appropriate:  Tobacco   Allergies   Meds   Problems   Med Hx   Surg Hx   Fam Hx       Objective  Well appearing patient in no apparent distress; mood and affect are within normal limits.  A focused examination was performed including face and neck. Relevant physical exam findings are noted in the Assessment and Plan.  Right Temple Scaly pink macule  Right Anterior Neck Erythematous patches with gritty scale.   Assessment & Plan  Carcinoma in situ of skin of face, unspecified location Right Temple  Destruction of lesion Complexity: simple   Destruction method: electrodesiccation and curettage   Informed consent: discussed and consent obtained   Timeout:  patient name, date of birth, surgical site, and procedure verified Anesthesia: the lesion was anesthetized in a standard fashion   Anesthetic:  1% lidocaine w/ epinephrine 1-100,000 local infiltration Curettage performed in three different directions: Yes   Electrodesiccation performed over the curetted area: Yes   Curettage cycles:  3 Final wound size (cm):  2.5 Hemostasis achieved with:  ferric subsulfate Outcome: patient tolerated procedure well with no complications   Additional details:  Wound innoculated with 5 fluorouracil solution.  AK (actinic keratosis) Right Anterior Neck  Fluorouracil (TOLAK) 4 % CREA - Right Anterior Neck Apply nightly for two weeks to face and neck  Destruction of lesion - Right Anterior Neck Complexity: simple   Destruction method: cryotherapy   Informed consent: discussed and consent obtained   Timeout:  patient name, date of birth, surgical site, and procedure verified Lesion destroyed using liquid nitrogen: Yes    Cryotherapy cycles:  1 Outcome: patient tolerated procedure well with no complications   Post-procedure details: wound care instructions given      I, Amaka Gluth, PA-C, have reviewed all documentation's for this visit.  The documentation on 05/10/21 for the exam, diagnosis, procedures and orders are all accurate and complete.

## 2021-06-22 DIAGNOSIS — I10 Essential (primary) hypertension: Secondary | ICD-10-CM | POA: Diagnosis not present

## 2021-06-22 DIAGNOSIS — E782 Mixed hyperlipidemia: Secondary | ICD-10-CM | POA: Diagnosis not present

## 2021-07-05 DIAGNOSIS — E1122 Type 2 diabetes mellitus with diabetic chronic kidney disease: Secondary | ICD-10-CM | POA: Diagnosis not present

## 2021-07-05 DIAGNOSIS — I10 Essential (primary) hypertension: Secondary | ICD-10-CM | POA: Diagnosis not present

## 2021-07-07 DIAGNOSIS — I129 Hypertensive chronic kidney disease with stage 1 through stage 4 chronic kidney disease, or unspecified chronic kidney disease: Secondary | ICD-10-CM | POA: Insufficient documentation

## 2021-07-08 DIAGNOSIS — E7800 Pure hypercholesterolemia, unspecified: Secondary | ICD-10-CM | POA: Insufficient documentation

## 2021-07-08 DIAGNOSIS — H04213 Epiphora due to excess lacrimation, bilateral lacrimal glands: Secondary | ICD-10-CM | POA: Diagnosis not present

## 2021-07-08 DIAGNOSIS — K219 Gastro-esophageal reflux disease without esophagitis: Secondary | ICD-10-CM | POA: Insufficient documentation

## 2021-07-08 DIAGNOSIS — Z0001 Encounter for general adult medical examination with abnormal findings: Secondary | ICD-10-CM | POA: Diagnosis not present

## 2021-07-08 DIAGNOSIS — Z8673 Personal history of transient ischemic attack (TIA), and cerebral infarction without residual deficits: Secondary | ICD-10-CM | POA: Diagnosis not present

## 2021-07-08 DIAGNOSIS — N1831 Chronic kidney disease, stage 3a: Secondary | ICD-10-CM | POA: Diagnosis not present

## 2021-07-08 DIAGNOSIS — I2510 Atherosclerotic heart disease of native coronary artery without angina pectoris: Secondary | ICD-10-CM | POA: Diagnosis not present

## 2021-07-08 DIAGNOSIS — E6609 Other obesity due to excess calories: Secondary | ICD-10-CM | POA: Diagnosis not present

## 2021-07-08 DIAGNOSIS — H04123 Dry eye syndrome of bilateral lacrimal glands: Secondary | ICD-10-CM | POA: Diagnosis not present

## 2021-07-08 DIAGNOSIS — G8929 Other chronic pain: Secondary | ICD-10-CM | POA: Diagnosis not present

## 2021-07-08 DIAGNOSIS — I129 Hypertensive chronic kidney disease with stage 1 through stage 4 chronic kidney disease, or unspecified chronic kidney disease: Secondary | ICD-10-CM | POA: Diagnosis not present

## 2021-07-08 DIAGNOSIS — H02204 Unspecified lagophthalmos left upper eyelid: Secondary | ICD-10-CM | POA: Diagnosis not present

## 2021-07-08 DIAGNOSIS — E782 Mixed hyperlipidemia: Secondary | ICD-10-CM | POA: Diagnosis not present

## 2021-07-08 DIAGNOSIS — H02201 Unspecified lagophthalmos right upper eyelid: Secondary | ICD-10-CM | POA: Diagnosis not present

## 2021-07-22 ENCOUNTER — Ambulatory Visit: Payer: Medicare HMO | Admitting: Physician Assistant

## 2021-08-18 ENCOUNTER — Other Ambulatory Visit: Payer: Self-pay

## 2021-08-18 ENCOUNTER — Ambulatory Visit (INDEPENDENT_AMBULATORY_CARE_PROVIDER_SITE_OTHER): Payer: PPO | Admitting: Physician Assistant

## 2021-08-18 DIAGNOSIS — Z85828 Personal history of other malignant neoplasm of skin: Secondary | ICD-10-CM | POA: Diagnosis not present

## 2021-08-18 DIAGNOSIS — L570 Actinic keratosis: Secondary | ICD-10-CM

## 2021-08-18 MED ORDER — TOLAK 4 % EX CREA: TOPICAL_CREAM | CUTANEOUS | 1 refills | Status: DC

## 2021-09-06 ENCOUNTER — Encounter: Payer: Self-pay | Admitting: Physician Assistant

## 2021-09-06 NOTE — Progress Notes (Signed)
? ?  Follow-Up Visit ?  ?Subjective  ?Jeremy [name] is a 76 y.o. male who presents for the following: Follow-up (Here to follow up on tolak on face. Patient had good reaction. Per patient face is doing better. History of non mole skin cancers. ). ? ? ?The following portions of the chart were reviewed this encounter and updated as appropriate:  Tobacco  Allergies  Meds  Problems  Med Hx  Surg Hx  Fam Hx   ?  ? ?Objective  ?Well appearing patient in no apparent distress; mood and affect are within normal limits. ? ?All skin waist up examined. ? ?Head - Anterior (Face) ?Marked improvement in the texture of his skin.  Patient had photos of a very brisk reaction- erythema and scale ? ? ?Assessment & Plan  ?AK (actinic keratosis) ?Head - Anterior (Face) ? ?Tolak treatment follow up ? ?Fluorouracil (TOLAK) 4 % CREA - Head - Anterior (Face) ?Apply nightly for two weeks to face and neck ?Late fall or winter.  ? ? ?I, Merla Sawka, PA-C, have reviewed all documentation's for this visit.  The documentation on 09/06/21 for the exam, diagnosis, procedures and orders are all accurate and complete. ?

## 2021-10-06 DIAGNOSIS — B372 Candidiasis of skin and nail: Secondary | ICD-10-CM | POA: Diagnosis not present

## 2021-11-13 ENCOUNTER — Encounter (HOSPITAL_COMMUNITY): Payer: Self-pay

## 2021-11-13 ENCOUNTER — Other Ambulatory Visit: Payer: Self-pay

## 2021-11-13 ENCOUNTER — Emergency Department (HOSPITAL_COMMUNITY): Payer: PPO

## 2021-11-13 ENCOUNTER — Inpatient Hospital Stay (HOSPITAL_COMMUNITY)
Admission: EM | Admit: 2021-11-13 | Discharge: 2021-11-18 | DRG: 246 | Disposition: A | Discharge status: Home / Self Care | Payer: PPO | Attending: Cardiovascular Disease | Admitting: Cardiovascular Disease

## 2021-11-13 DIAGNOSIS — I6623 Occlusion and stenosis of bilateral posterior cerebral arteries: Secondary | ICD-10-CM | POA: Diagnosis not present

## 2021-11-13 DIAGNOSIS — Z87442 Personal history of urinary calculi: Secondary | ICD-10-CM

## 2021-11-13 DIAGNOSIS — Z8719 Personal history of other diseases of the digestive system: Secondary | ICD-10-CM

## 2021-11-13 DIAGNOSIS — I639 Cerebral infarction, unspecified: Secondary | ICD-10-CM | POA: Diagnosis not present

## 2021-11-13 DIAGNOSIS — I6330 Cerebral infarction due to thrombosis of unspecified cerebral artery: Secondary | ICD-10-CM

## 2021-11-13 DIAGNOSIS — R4182 Altered mental status, unspecified: Secondary | ICD-10-CM | POA: Diagnosis not present

## 2021-11-13 DIAGNOSIS — Z85828 Personal history of other malignant neoplasm of skin: Secondary | ICD-10-CM

## 2021-11-13 DIAGNOSIS — I447 Left bundle-branch block, unspecified: Secondary | ICD-10-CM | POA: Diagnosis not present

## 2021-11-13 DIAGNOSIS — I6523 Occlusion and stenosis of bilateral carotid arteries: Secondary | ICD-10-CM | POA: Diagnosis not present

## 2021-11-13 DIAGNOSIS — I252 Old myocardial infarction: Secondary | ICD-10-CM

## 2021-11-13 DIAGNOSIS — I081 Rheumatic disorders of both mitral and tricuspid valves: Secondary | ICD-10-CM | POA: Diagnosis not present

## 2021-11-13 DIAGNOSIS — I739 Peripheral vascular disease, unspecified: Secondary | ICD-10-CM | POA: Diagnosis not present

## 2021-11-13 DIAGNOSIS — Z8249 Family history of ischemic heart disease and other diseases of the circulatory system: Secondary | ICD-10-CM

## 2021-11-13 DIAGNOSIS — R001 Bradycardia, unspecified: Secondary | ICD-10-CM | POA: Diagnosis not present

## 2021-11-13 DIAGNOSIS — I6340 Cerebral infarction due to embolism of unspecified cerebral artery: Secondary | ICD-10-CM | POA: Diagnosis not present

## 2021-11-13 DIAGNOSIS — Z955 Presence of coronary angioplasty implant and graft: Secondary | ICD-10-CM

## 2021-11-13 DIAGNOSIS — R29710 NIHSS score 10: Secondary | ICD-10-CM | POA: Diagnosis not present

## 2021-11-13 DIAGNOSIS — Z91041 Radiographic dye allergy status: Secondary | ICD-10-CM

## 2021-11-13 DIAGNOSIS — E669 Obesity, unspecified: Secondary | ICD-10-CM | POA: Diagnosis not present

## 2021-11-13 DIAGNOSIS — I129 Hypertensive chronic kidney disease with stage 1 through stage 4 chronic kidney disease, or unspecified chronic kidney disease: Secondary | ICD-10-CM | POA: Diagnosis present

## 2021-11-13 DIAGNOSIS — Z8673 Personal history of transient ischemic attack (TIA), and cerebral infarction without residual deficits: Secondary | ICD-10-CM

## 2021-11-13 DIAGNOSIS — I63441 Cerebral infarction due to embolism of right cerebellar artery: Secondary | ICD-10-CM | POA: Diagnosis not present

## 2021-11-13 DIAGNOSIS — K219 Gastro-esophageal reflux disease without esophagitis: Secondary | ICD-10-CM | POA: Diagnosis present

## 2021-11-13 DIAGNOSIS — Z7902 Long term (current) use of antithrombotics/antiplatelets: Secondary | ICD-10-CM

## 2021-11-13 DIAGNOSIS — I63431 Cerebral infarction due to embolism of right posterior cerebral artery: Secondary | ICD-10-CM | POA: Diagnosis not present

## 2021-11-13 DIAGNOSIS — I200 Unstable angina: Secondary | ICD-10-CM | POA: Diagnosis present

## 2021-11-13 DIAGNOSIS — I493 Ventricular premature depolarization: Secondary | ICD-10-CM | POA: Diagnosis not present

## 2021-11-13 DIAGNOSIS — R471 Dysarthria and anarthria: Secondary | ICD-10-CM | POA: Diagnosis not present

## 2021-11-13 DIAGNOSIS — Z87891 Personal history of nicotine dependence: Secondary | ICD-10-CM

## 2021-11-13 DIAGNOSIS — R079 Chest pain, unspecified: Secondary | ICD-10-CM | POA: Diagnosis not present

## 2021-11-13 DIAGNOSIS — Z808 Family history of malignant neoplasm of other organs or systems: Secondary | ICD-10-CM

## 2021-11-13 DIAGNOSIS — I25119 Atherosclerotic heart disease of native coronary artery with unspecified angina pectoris: Secondary | ICD-10-CM

## 2021-11-13 DIAGNOSIS — I6782 Cerebral ischemia: Secondary | ICD-10-CM | POA: Diagnosis not present

## 2021-11-13 DIAGNOSIS — Z9842 Cataract extraction status, left eye: Secondary | ICD-10-CM

## 2021-11-13 DIAGNOSIS — I249 Acute ischemic heart disease, unspecified: Secondary | ICD-10-CM

## 2021-11-13 DIAGNOSIS — R4701 Aphasia: Secondary | ICD-10-CM | POA: Diagnosis not present

## 2021-11-13 DIAGNOSIS — I214 Non-ST elevation (NSTEMI) myocardial infarction: Secondary | ICD-10-CM | POA: Diagnosis not present

## 2021-11-13 DIAGNOSIS — I63331 Cerebral infarction due to thrombosis of right posterior cerebral artery: Secondary | ICD-10-CM | POA: Diagnosis not present

## 2021-11-13 DIAGNOSIS — I77810 Thoracic aortic ectasia: Secondary | ICD-10-CM | POA: Diagnosis not present

## 2021-11-13 DIAGNOSIS — Z7982 Long term (current) use of aspirin: Secondary | ICD-10-CM

## 2021-11-13 DIAGNOSIS — R569 Unspecified convulsions: Secondary | ICD-10-CM | POA: Diagnosis not present

## 2021-11-13 DIAGNOSIS — E785 Hyperlipidemia, unspecified: Secondary | ICD-10-CM | POA: Diagnosis not present

## 2021-11-13 DIAGNOSIS — C4492 Squamous cell carcinoma of skin, unspecified: Secondary | ICD-10-CM | POA: Diagnosis present

## 2021-11-13 DIAGNOSIS — Z79899 Other long term (current) drug therapy: Secondary | ICD-10-CM

## 2021-11-13 DIAGNOSIS — I9589 Other hypotension: Secondary | ICD-10-CM | POA: Diagnosis not present

## 2021-11-13 DIAGNOSIS — N1831 Chronic kidney disease, stage 3a: Secondary | ICD-10-CM | POA: Diagnosis not present

## 2021-11-13 DIAGNOSIS — Z885 Allergy status to narcotic agent status: Secondary | ICD-10-CM

## 2021-11-13 DIAGNOSIS — I440 Atrioventricular block, first degree: Secondary | ICD-10-CM | POA: Diagnosis not present

## 2021-11-13 DIAGNOSIS — N183 Chronic kidney disease, stage 3 unspecified: Secondary | ICD-10-CM | POA: Diagnosis present

## 2021-11-13 DIAGNOSIS — Z9841 Cataract extraction status, right eye: Secondary | ICD-10-CM

## 2021-11-13 DIAGNOSIS — I4891 Unspecified atrial fibrillation: Secondary | ICD-10-CM | POA: Diagnosis not present

## 2021-11-13 DIAGNOSIS — R220 Localized swelling, mass and lump, head: Secondary | ICD-10-CM | POA: Diagnosis not present

## 2021-11-13 DIAGNOSIS — I10 Essential (primary) hypertension: Secondary | ICD-10-CM | POA: Diagnosis present

## 2021-11-13 DIAGNOSIS — N179 Acute kidney failure, unspecified: Secondary | ICD-10-CM | POA: Diagnosis not present

## 2021-11-13 DIAGNOSIS — R410 Disorientation, unspecified: Secondary | ICD-10-CM | POA: Diagnosis not present

## 2021-11-13 DIAGNOSIS — I6503 Occlusion and stenosis of bilateral vertebral arteries: Secondary | ICD-10-CM | POA: Diagnosis not present

## 2021-11-13 DIAGNOSIS — E782 Mixed hyperlipidemia: Secondary | ICD-10-CM | POA: Diagnosis present

## 2021-11-13 DIAGNOSIS — I2510 Atherosclerotic heart disease of native coronary artery without angina pectoris: Secondary | ICD-10-CM

## 2021-11-13 DIAGNOSIS — Z87898 Personal history of other specified conditions: Secondary | ICD-10-CM | POA: Diagnosis not present

## 2021-11-13 DIAGNOSIS — Z961 Presence of intraocular lens: Secondary | ICD-10-CM | POA: Diagnosis present

## 2021-11-13 DIAGNOSIS — Z6834 Body mass index (BMI) 34.0-34.9, adult: Secondary | ICD-10-CM

## 2021-11-13 DIAGNOSIS — I25110 Atherosclerotic heart disease of native coronary artery with unstable angina pectoris: Principal | ICD-10-CM | POA: Diagnosis present

## 2021-11-13 DIAGNOSIS — Z9862 Peripheral vascular angioplasty status: Secondary | ICD-10-CM | POA: Diagnosis not present

## 2021-11-13 LAB — CBC
HCT: 40.3 % (ref 39.0–52.0)
Hemoglobin: 13.1 g/dL (ref 13.0–17.0)
MCH: 27.3 pg (ref 26.0–34.0)
MCHC: 32.5 g/dL (ref 30.0–36.0)
MCV: 84.1 fL (ref 80.0–100.0)
Platelets: 216 10*3/uL (ref 150–400)
RBC: 4.79 MIL/uL (ref 4.22–5.81)
RDW: 14.6 % (ref 11.5–15.5)
WBC: 5.8 10*3/uL (ref 4.0–10.5)
nRBC: 0 % (ref 0.0–0.2)

## 2021-11-13 LAB — MAGNESIUM: Magnesium: 2.1 mg/dL (ref 1.7–2.4)

## 2021-11-13 LAB — BASIC METABOLIC PANEL
Anion gap: 9 (ref 5–15)
BUN: 14 mg/dL (ref 8–23)
CO2: 22 mmol/L (ref 22–32)
Calcium: 9.1 mg/dL (ref 8.9–10.3)
Chloride: 105 mmol/L (ref 98–111)
Creatinine, Ser: 1.46 mg/dL — ABNORMAL HIGH (ref 0.61–1.24)
GFR, Estimated: 50 mL/min — ABNORMAL LOW (ref >60)
Glucose, Bld: 94 mg/dL (ref 70–99)
Potassium: 4.1 mmol/L (ref 3.5–5.1)
Sodium: 136 mmol/L (ref 135–145)

## 2021-11-13 LAB — HEMOGLOBIN A1C
Hgb A1c MFr Bld: 5.4 % (ref 4.8–5.6)
Mean Plasma Glucose: 108.28 mg/dL

## 2021-11-13 LAB — D-DIMER, QUANTITATIVE: D-Dimer, Quant: 0.42 ug/mL-FEU (ref 0.00–0.50)

## 2021-11-13 LAB — TROPONIN I (HIGH SENSITIVITY)
Troponin I (High Sensitivity): 10 ng/L (ref <18)
Troponin I (High Sensitivity): 8 ng/L (ref <18)

## 2021-11-13 LAB — PROTIME-INR
INR: 1.2 (ref 0.8–1.2)
Prothrombin Time: 14.7 seconds (ref 11.4–15.2)

## 2021-11-13 LAB — TSH: TSH: 2.606 u[IU]/mL (ref 0.350–4.500)

## 2021-11-13 MED ORDER — OXYCODONE HCL 5 MG PO TABS
15.0000 mg | ORAL_TABLET | Freq: Four times a day (QID) | ORAL | Status: DC | PRN
  Administered 2021-11-13 – 2021-11-15 (×2): 15 mg via ORAL
  Filled 2021-11-13 (×3): qty 3

## 2021-11-13 MED ORDER — NYSTATIN 100000 UNIT/GM EX POWD
1.0000 | Freq: Two times a day (BID) | CUTANEOUS | Status: DC
  Administered 2021-11-13 – 2021-11-18 (×8): 1 via TOPICAL
  Filled 2021-11-13 (×2): qty 15

## 2021-11-13 MED ORDER — HEPARIN SODIUM (PORCINE) 5000 UNIT/ML IJ SOLN
5000.0000 [IU] | Freq: Three times a day (TID) | INTRAMUSCULAR | Status: DC
  Administered 2021-11-13 – 2021-11-14 (×2): 5000 [IU] via SUBCUTANEOUS
  Filled 2021-11-13 (×2): qty 1

## 2021-11-13 MED ORDER — ASPIRIN 81 MG PO TBEC: 81.0000 mg | DELAYED_RELEASE_TABLET | Freq: Every day | ORAL | Status: DC

## 2021-11-13 MED ORDER — ALBUTEROL SULFATE (2.5 MG/3ML) 0.083% IN NEBU
2.5000 mg | INHALATION_SOLUTION | Freq: Four times a day (QID) | RESPIRATORY_TRACT | Status: DC | PRN
  Administered 2021-11-15: 2.5 mg via RESPIRATORY_TRACT
  Filled 2021-11-13: qty 3

## 2021-11-13 MED ORDER — ASPIRIN 81 MG PO CHEW: 324.0000 mg | CHEWABLE_TABLET | ORAL | Status: AC

## 2021-11-13 MED ORDER — LOSARTAN POTASSIUM 25 MG PO TABS
25.0000 mg | ORAL_TABLET | Freq: Every day | ORAL | Status: DC
  Administered 2021-11-14 – 2021-11-15 (×2): 25 mg via ORAL
  Filled 2021-11-13 (×2): qty 1

## 2021-11-13 MED ORDER — ALBUTEROL SULFATE HFA 108 (90 BASE) MCG/ACT IN AERS: 1.0000 | INHALATION_SPRAY | Freq: Four times a day (QID) | RESPIRATORY_TRACT | Status: DC | PRN

## 2021-11-13 MED ORDER — CARVEDILOL 12.5 MG PO TABS
12.5000 mg | ORAL_TABLET | Freq: Two times a day (BID) | ORAL | Status: DC
  Administered 2021-11-13 – 2021-11-15 (×4): 12.5 mg via ORAL
  Filled 2021-11-13 (×5): qty 1

## 2021-11-13 MED ORDER — NITROGLYCERIN 0.4 MG SL SUBL
0.4000 mg | SUBLINGUAL_TABLET | Freq: Once | SUBLINGUAL | Status: AC
Start: 2021-11-13 — End: 2021-11-13
  Administered 2021-11-13: 0.4 mg via SUBLINGUAL
  Filled 2021-11-13: qty 1

## 2021-11-13 MED ORDER — ASPIRIN 81 MG PO TBEC
81.0000 mg | DELAYED_RELEASE_TABLET | Freq: Every day | ORAL | Status: DC
  Administered 2021-11-14 – 2021-11-18 (×4): 81 mg via ORAL
  Filled 2021-11-13 (×5): qty 1

## 2021-11-13 MED ORDER — GABAPENTIN 300 MG PO CAPS
300.0000 mg | ORAL_CAPSULE | Freq: Three times a day (TID) | ORAL | Status: DC
  Administered 2021-11-13 – 2021-11-18 (×11): 300 mg via ORAL
  Filled 2021-11-13 (×12): qty 1

## 2021-11-13 MED ORDER — NITROGLYCERIN 0.4 MG SL SUBL
0.4000 mg | SUBLINGUAL_TABLET | SUBLINGUAL | Status: DC | PRN
  Administered 2021-11-13 (×3): 0.4 mg via SUBLINGUAL
  Filled 2021-11-13 (×4): qty 1

## 2021-11-13 MED ORDER — ASPIRIN 300 MG RE SUPP: 300.0000 mg | RECTAL | Status: AC

## 2021-11-13 MED ORDER — ACETAMINOPHEN 500 MG PO TABS: 500.0000 mg | ORAL_TABLET | Freq: Four times a day (QID) | ORAL | Status: DC | PRN

## 2021-11-13 MED ORDER — PANTOPRAZOLE SODIUM 40 MG PO TBEC
40.0000 mg | DELAYED_RELEASE_TABLET | Freq: Every day | ORAL | Status: DC
  Administered 2021-11-14 – 2021-11-18 (×5): 40 mg via ORAL
  Filled 2021-11-13 (×5): qty 1

## 2021-11-13 MED ORDER — CYCLOSPORINE 0.05 % OP EMUL
1.0000 [drp] | Freq: Two times a day (BID) | OPHTHALMIC | Status: DC
  Administered 2021-11-13 – 2021-11-18 (×9): 1 [drp] via OPHTHALMIC
  Filled 2021-11-13 (×12): qty 30

## 2021-11-13 MED ORDER — SODIUM CHLORIDE 0.9 % IV SOLN: INTRAVENOUS | Status: DC

## 2021-11-13 MED ORDER — NITROGLYCERIN 0.4 MG SL SUBL: 0.4000 mg | SUBLINGUAL_TABLET | SUBLINGUAL | Status: DC | PRN

## 2021-11-13 MED ORDER — CLOPIDOGREL BISULFATE 75 MG PO TABS
75.0000 mg | ORAL_TABLET | Freq: Every day | ORAL | Status: DC
  Administered 2021-11-14 – 2021-11-15 (×2): 75 mg via ORAL
  Filled 2021-11-13 (×2): qty 1

## 2021-11-13 MED ORDER — ONDANSETRON HCL 4 MG/2ML IJ SOLN: 4.0000 mg | Freq: Four times a day (QID) | INTRAMUSCULAR | Status: DC | PRN

## 2021-11-13 MED ORDER — LIFITEGRAST 5 % OP SOLN: 1.0000 [drp] | Freq: Every day | OPHTHALMIC | Status: DC | PRN

## 2021-11-13 MED ORDER — ISOSORBIDE MONONITRATE ER 30 MG PO TB24
30.0000 mg | ORAL_TABLET | Freq: Every day | ORAL | Status: DC
  Administered 2021-11-14 – 2021-11-15 (×2): 30 mg via ORAL
  Filled 2021-11-13 (×2): qty 1

## 2021-11-13 MED ORDER — ASPIRIN 81 MG PO CHEW
243.0000 mg | CHEWABLE_TABLET | Freq: Once | ORAL | Status: AC
  Administered 2021-11-13: 243 mg via ORAL
  Filled 2021-11-13: qty 3

## 2021-11-13 MED ORDER — ACETAMINOPHEN 325 MG PO TABS
650.0000 mg | ORAL_TABLET | ORAL | Status: DC | PRN
  Administered 2021-11-13 – 2021-11-16 (×4): 650 mg via ORAL
  Filled 2021-11-13 (×4): qty 2

## 2021-11-13 NOTE — ED Notes (Addendum)
Pt reports onset of intermittent L sided chest pressure onset 2 days ago which occurs mostly w/ exertion and calms down when he sits, however pt is experienced chest pressure at rest at this time 5/10. Pt reports increased pain w/ inspiration. Pt is on ASA and plavix. Pt reports this feels the same as when he had previous MI's. Pain does not radiate. Pain is reproducible upon palpation of L chest.

## 2021-11-13 NOTE — ED Triage Notes (Signed)
Patient complains of intermittent chest pain x 2 days. Has had MI and several stents, lat cath 2021. Patient has not taken any NTG the past 2 days. Alert and oriented, pain worse with any activity

## 2021-11-13 NOTE — H&P (Signed)
Cardiology Consultation:   Patient ID: Jeremy [name] MRN: [id number]; DOB: 1946-05-02  Admit date: 11/13/2021 Date of Consult: 11/13/2021  PCP:  Benita Stabile, MD   [facility] Providers Cardiologist:  Charlton Haws, MD       Chief Complaint:  CHest pain  Patient Profile:   Jeremy [name] is a 76 y.o. male with a hx of CAD with stenting of LCX LAD, HTN, HLD, PVD with hx CVA and Rt CEA. who is being seen 11/13/2021 for the evaluation of chest pain at the request of Dr Stevie Kern.  History of Present Illness:   [name] with above hx, remote stent to RCA and last cath 03/2019 with pRCA stenosis 50 and 60%, PCI to 90% stenosis to LCX and Scoring balloon angioplasty was performed using a BALLOON WOLVERINE 2.50X10 and then DES to pLAD for 99% stenosis.  Residual disease dLCX 70% , second OM 60% stenosis and 2nd diag 80% stenosis  EF 50-55%.    Echo in 2022 with EF 60-65%, no RWMA. Moderate LVH. G1DD. Aortic dilatation noted. There is mild dilatation of the aortic root,  measuring 39 mm.   Carotid dopplers per Dr. Arbie Cookey with Rt ICA 1-39% stenosis and Lt ICA 1-39% stenosis.    Last saw Dr. Eden Emms last  year.  Was stable and no angina.   Pt presented to ER chest pain for 2-3 days.  He had not taken NTG.  Pain increases with activity.  Resolves with rest.  Also notes pain increased with inspiration.  Pain is also reproducible in part with palpation.  There is no rash.  No change with eating.  He states that this is how it felt prior to his last heart attack.  He is concerned.   EKG:  The EKG was personally reviewed and demonstrates:  SR with LBBB PR 1st degree AV block PR 214 ms.   Telemetry:  Telemetry was personally reviewed and demonstrates:  SR LBBB and 1st degree AV block.   BP 147/76 P 62 afebrile, R 17 to 21 with sp02 97%  Na 136, K+ 4.1 Cr 1.46 hs troponin 10  second one P Hgb 13.1, WBC 5.8 plts 216   2V CXR NAD.   Past Medical History:  Diagnosis Date   Arthritis    Basal cell  carcinoma 02/11/2021   nod- left dorsal hand (EXC)   CAD (coronary artery disease)    a. s/p prior stenting of LAD and RCA b. 03/2019: NSTEMI and required PTCA/DESx1 to the LAD and successful scoring balloon angioplasty in the previously stented segment of LCx   Carotid stenosis, asymptomatic, right    Carpal tunnel syndrome    bilateral   Coronary artery disease    GERD (gastroesophageal reflux disease)    History of kidney stones    Hx of colonic polyp    Hypercholesterolemia    Hypertension    Myocardial infarction (HCC)    hx of   Obesity    SCC (squamous cell carcinoma) 02/11/2021   in situ- right temple (CX35FU)   Squamous cell carcinoma of skin 08/09/2016   well differentiated on right outer eye - tx p bx   Stroke [facility])     Past Surgical History:  Procedure Laterality Date   CARDIAC CATHETERIZATION     CATARACT EXTRACTION W/PHACO Left 03/12/2021   Procedure: CATARACT EXTRACTION PHACO AND INTRAOCULAR LENS PLACEMENT (IOC);  Surgeon: Fabio Pierce, MD;  Location: [facility] ORS;  Service: Ophthalmology;  Laterality: Left;   CDE:  14.58   CATARACT EXTRACTION W/PHACO Right 04/08/2021   Procedure: CATARACT EXTRACTION PHACO AND INTRAOCULAR LENS PLACEMENT RIGHT EYE;  Surgeon: Fabio Pierce, MD;  Location: [facility];  Service: Ophthalmology;  Laterality: Right;  CDE 10.94   CORONARY BALLOON ANGIOPLASTY N/A 03/26/2019   Procedure: CORONARY BALLOON ANGIOPLASTY;  Surgeon: Kathleene Hazel, MD;  Location: [facility] INVASIVE CV LAB;  Service: Cardiovascular;  Laterality: N/A;   CORONARY STENT INTERVENTION N/A 03/26/2019   Procedure: CORONARY STENT INTERVENTION;  Surgeon: Kathleene Hazel, MD;  Location: [facility] INVASIVE CV LAB;  Service: Cardiovascular;  Laterality: N/A;   CORONARY STENT PLACEMENT  2000   x3    CYST EXCISION     x 2   ENDARTERECTOMY Right 12/25/2017   Procedure: ENDARTERECTOMY CAROTID RIGHT;  Surgeon: Larina Earthly, MD;  Location: [facility] OR;  Service: Vascular;  Laterality:  Right;   LEFT HEART CATH AND CORONARY ANGIOGRAPHY N/A 03/26/2019   Procedure: LEFT HEART CATH AND CORONARY ANGIOGRAPHY;  Surgeon: Kathleene Hazel, MD;  Location: [facility] INVAS[facility] CV LAB;  Service: Cardiovascular;  Laterality: N/A;   PATCH ANGIOPLASTY Right 12/25/2017   Procedure: PATCH ANGIOPLASTY RIGHT CAROTID ARTERY;  Surgeon: Larina Earthly, MD;  Location: [facility] OR;  Service: Vascular;  Laterality: Right;   TEAR DUCT PROBING Bilateral 07/2020   done at [facility]. Dr. Mickie Kay   TEE WITHOUT CARDIOVERSION N/A 06/29/2018   Procedure: TRANSESOPHAGEAL ECHOCARDIOGRAM (TEE);  Surgeon: Pricilla Riffle, MD;  Location: [facility] ENDOSCOPY;  Service: Cardiovascular;  Laterality: N/A;     Home Medications:  Prior to Admission medications   Medication Sig Start Date End Date Taking? Authorizing Provider  acetaminophen (TYLENOL) 500 MG tablet Take 500 mg by mouth every 6 (six) hours as needed for mild pain.   Yes [provider]  albuterol (VENTOLIN HFA) 108 (90 Base) MCG/ACT inhaler Inhale 1-2 puffs into the lungs every 6 (six) hours as needed for shortness of breath or wheezing. 12/03/20  Yes [provider]  aspirin EC 81 MG EC tablet Take 1 tablet (81 mg total) by mouth daily. 03/28/19  Yes Furth, Cadence H, PA-C  carvedilol (COREG) 12.5 MG tablet Take 1 tablet (12.5 mg total) by mouth 2 (two) times daily with a meal. 06/30/18  Yes Narda Bonds, MD  clopidogrel (PLAVIX) 75 MG tablet Take 1 tablet by mouth once daily 11/10/20  Yes Wendall Stade, MD  gabapentin (NEURONTIN) 300 MG capsule Take 1 capsule (300 mg total) by mouth 3 (three) times daily. 08/21/18  Yes Micki Riley, MD  isosorbide mononitrate (IMDUR) 30 MG 24 hr tablet Take 1 tablet (30 mg total) by mouth daily. 10/28/20 11/13/21 Yes Wendall Stade, MD  losartan (COZAAR) 25 MG tablet Take 25 mg by mouth daily. 08/05/21  Yes [provider]  nitroGLYCERIN (NITROSTAT) 0.4 MG SL tablet Place 1 tablet (0.4 mg total) under the tongue  every 5 (five) minutes as needed for chest pain. 10/05/20  Yes Wendall Stade, MD  nystatin (MYCOSTATIN/NYSTOP) powder Apply 1 Application topically in the morning and at bedtime. 10/06/21  Yes [provider]  nystatin cream (MYCOSTATIN) Apply 1 Application topically 2 (two) times daily. 10/06/21  Yes [provider]  omeprazole (PRILOSEC) 40 MG capsule Take 40 mg by mouth daily. 02/01/19  Yes [provider]  oxyCODONE (ROXICODONE) 15 MG immediate release tablet Take 15 mg by mouth every 6 (six) hours as needed for pain. 10/18/19  Yes [provider]  Propylene Glycol (SYSTANE  COMPLETE OP) Place 1 drop into both eyes daily as needed (dry eyes).   Yes [provider]  RESTASIS MULTIDOSE 0.05 % ophthalmic emulsion Place 1 drop into both eyes 2 (two) times daily. 09/22/21  Yes [provider]  rosuvastatin (CRESTOR) 40 MG tablet Take 40 mg by mouth at bedtime. 10/30/19  Yes [provider]  XIIDRA 5 % SOLN Place 1 drop into both eyes daily as needed (dry eye). 07/08/21  Yes [provider]  Fluorouracil (TOLAK) 4 % CREA Apply nightly for two weeks to face and neck Patient not taking: Reported on 11/13/2021 08/18/21   Glyn Ade, PA-C  prochlorperazine (COMPAZINE) 10 MG tablet Take 1 tablet (10 mg total) by mouth 2 (two) times daily as needed (Headache). Patient not taking: Reported on 11/13/2021 10/27/17   Elpidio Anis, PA-C    Inpatient Medications: Scheduled Meds:  aspirin  324 mg Oral NOW   Or   aspirin  300 mg Rectal NOW   aspirin EC  81 mg Oral Daily   [START ON 11/14/2021] aspirin EC  81 mg Oral Daily   carvedilol  12.5 mg Oral BID WC   clopidogrel  75 mg Oral Daily   cycloSPORINE  1 drop Both Eyes BID   gabapentin  300 mg Oral TID   heparin  5,000 Units Subcutaneous Q8H   isosorbide mononitrate  30 mg Oral Daily   losartan  25 mg Oral Daily   nystatin  1 Application Topical BID   pantoprazole  40 mg Oral Daily    Continuous Infusions:  sodium chloride     PRN Meds:   Allergies:    Allergies  Allergen Reactions   Codeine Anaphylaxis   Oxycontin [Oxycodone Hcl] Itching    Through IV- has since had oral oxycodone without issue   Contrast Media [Iodinated Contrast Media]     Patient got very nauseated. Gave 4mg  Zofran. Possibly pre medicate with Zofran    Morphine And Related Itching    Social History:   Social History   Socioeconomic History   Marital status: Divorced    Spouse name: Not on file   Number of children: Not on file   Years of education: Not on file   Highest education level: Not on file  Occupational History   Occupation: retired    Comment: welder  Tobacco Use   Smoking status: Former    Packs/day: 2.00    Years: 20.00    Total pack years: 40.00    Types: Cigarettes    Quit date: 05/23/1988    Years since quitting: 33.4   Smokeless tobacco: Never   Tobacco comments:    smoked 2 packs per day for 20 years  Vaping Use   Vaping Use: Never used  Substance and Sexual Activity   Alcohol use: No   Drug use: No   Sexual activity: Yes  Other Topics Concern   Not on file  Social History Narrative   Not on file   Social Determinants of Health   Financial Resource Strain: Not on file  Food Insecurity: Not on file  Transportation Needs: Not on file  Physical Activity: Not on file  Stress: Not on file  Social Connections: Not on file  Intimate Partner Violence: Not on file    Family History:    Family History  Problem Relation Age of Onset   Other Father 73       deceased old age   Coronary artery disease Mother 53  deceased   Hypertension Mother    Other Sister 59       living and healthy   Coronary artery disease Brother    Skin cancer Brother    Colon cancer Neg Hx    Pancreatic cancer Neg Hx    Stomach cancer Neg Hx    Esophageal cancer Neg Hx    Liver disease Neg Hx      ROS:  Please see the history of present illness.  General:no  colds or fevers, no weight changes Skin:no rashes or ulcers HEENT:no blurred vision, no congestion CV:see HPI PUL:see HPI GI:no diarrhea constipation or melena, no indigestion GU:no hematuria, no dysuria MS:no joint pain, no claudication hx of CVA  Neuro:no syncope, no lightheadedness Endo:no diabetes, no thyroid disease  All other ROS reviewed and negative.     Physical Exam/Data:   Vitals:   11/13/21 1630 11/13/21 1645 11/13/21 1700 11/13/21 1715  BP: 131/61 130/68 (!) 153/76 129/66  Pulse: 65 63 63 63  Resp: 18 16 14 17   Temp:      TempSrc:      SpO2: 96% 96% 96% 95%  Weight:      Height:       No intake or output data in the 24 hours ending 11/13/21 1731    11/13/2021    3:33 PM 02/10/2021    8:36 AM 10/05/2020    9:10 AM  Last 3 Weights  Weight (lbs) 216 lb 11.4 oz 216 lb 12.8 oz 216 lb 3.2 oz  Weight (kg) 98.3 kg 98.34 kg 98.068 kg     Body mass index is 33.94 kg/m.   Exam per Dr. Anne Fu  General:  Well nourished, well developed, in no acute distress HEENT: normal Neck: no JVD Vascular: No carotid bruits; Distal pulses 2+ bilaterally Cardiac:  normal S1, S2; RRR; no murmur gallup or click  Lungs:  clear to auscultation bilaterally, no wheezing, rhonchi or rales  Abd: soft, nontender, no hepatomegaly  Ext: no edema Musculoskeletal:  No deformities, BUE and BLE strength normal and equal Skin: warm and dry  Neuro:  CNs 2-12 intact, no focal abnormalities noted Psych:  Normal affect    Relevant CV Studies: Cardiac Catheterization: 03/26/2019 Prox RCA lesion is 50% stenosed. Dist RCA lesion is 60% stenosed. RPDA lesion is 50% stenosed. Prox Cx to Mid Cx lesion is 90% stenosed. Prox LAD lesion is 99% stenosed. Scoring balloon angioplasty was performed using a BALLOON WOLVERINE 2.50X10. Post intervention, there is a 20% residual stenosis. A drug-eluting stent was successfully placed using a STENT SYNERGY DES 2.75X20. Post intervention, there is a 0% residual  stenosis. Dist Cx lesion is 70% stenosed. 2nd Mrg lesion is 60% stenosed. 2nd Diag lesion is 80% stenosed. The left ventricular systolic function is normal. LV end diastolic pressure is normal. The left ventricular ejection fraction is 50-55% by visual estimate. There is no mitral valve regurgitation.   1. Severe stenosis proximal LAD. Successful PTCA/DES x 1 proximal LAD 2. Severe restenosis in the mid Circumflex stented segment. Successful scoring balloon angioplasty within the old stented segment. No stent placed.  3. Moderate stenosis in the distal Circumflex branches.  4. Moderate restenosis within the old mid RCA stent. This does not appear to be flow limiting. Moderate distal RCA and PDA stenosis. 5. Low normal LV systolic function, LVEF around 50%.    Recommendations: DAPT with ASA and Plavix for at least one year. Continue beta blocker and statin.     Echocardiogram: 05/2018  IMPRESSIONS      1. The left ventricle has moderately reduced systolic function of 35-40%. The cavity size is normal. There is severe left ventricular wall thickness. Echo evidence of pseudonormal diastolic filling patterns.  2. Normal left atrial size.  3. Normal right atrial size.  4. The mitral valve normal in structure. Regurgitation is mild by color flow Doppler.  5. Normal tricuspid valve.  6. Tricuspid regurgitation is mild.  7. The aortic valve tricuspid. There is mild thickening of the aortic valve.  8. No atrial level shunt detected by color flow Doppler.    Laboratory Data:  High Sensitivity Troponin:   Recent Labs  Lab 11/13/21 1503  TROPONINIHS 10     Chemistry Recent Labs  Lab 11/13/21 1503  NA 136  K 4.1  CL 105  CO2 22  GLUCOSE 94  BUN 14  CREATININE 1.46*  CALCIUM 9.1  GFRNONAA 50*  ANIONGAP 9    No results for input(s): "PROT", "ALBUMIN", "AST", "ALT", "ALKPHOS", "BILITOT" in the last 168 hours. Lipids No results for input(s): "CHOL", "TRIG", "HDL", "LABVLDL",  "LDLCALC", "CHOLHDL" in the last 168 hours.  Hematology Recent Labs  Lab 11/13/21 1503  WBC 5.8  RBC 4.79  HGB 13.1  HCT 40.3  MCV 84.1  MCH 27.3  MCHC 32.5  RDW 14.6  PLT 216   Thyroid No results for input(s): "TSH", "FREET4" in the last 168 hours.  BNPNo results for input(s): "BNP", "PROBNP" in the last 168 hours.  DDimer  Recent Labs  Lab 11/13/21 1622  DDIMER 0.42     Radiology/Studies:  DG Chest 2 View  Result Date: 11/13/2021 CLINICAL DATA:  Chest pain. EXAM: CHEST - 2 VIEW COMPARISON:  09/18/2020 FINDINGS: Normal sized heart. Clear lungs with normal vascularity. Mildly tortuous and partially calcified thoracic aorta. Thoracic spine and bilateral shoulder degenerative changes. IMPRESSION: No active cardiopulmonary disease. Electronically Signed   By: Beckie Salts M.D.   On: 11/13/2021 16:03     Assessment and Plan:   Coronary artery disease - Known CAD.  Last cath approximately 3 years ago resulted in 2 stents.  He is stress test prior to his cath was normal.  I think it makes sense for [place] even though there are some atypical features here to proceed with cardiac catheterization on Monday.  Risk and benefits of been explained including stroke heart attack death renal impairment bleeding.  He is willing to proceed.  Wife was present for discussion.  Right radial approach.  He has been on dual antiplatelet therapy with aspirin and Plavix.  Has not missed any doses.  The part that worries me is the intensification of discomfort with movement as well as increased shortness of breath with activity. -Continue with Imdur 30, carvedilol 12.5 twice a day.  Former tobacco use - Quit smoking after first heart attack.  Carotid artery stenosis status post CEA - Dr. Arbie Cookey.  Notes reviewed.  Stable.  We will continue with goal-directed medical therapy.  Since his troponin is normal, we will hold off on IV heparin.  On Sunday, gentle fluids prior to cardiac  catheterization.    Risk Assessment/Risk Scores:     HEAR Score (for undifferentiated chest pain):   4          For questions or updates, please contact [facility] Please consult [email or web address] for contact info under    Signed, Donato Schultz, MD  11/13/2021 5:31 PM

## 2021-11-14 ENCOUNTER — Encounter (HOSPITAL_COMMUNITY): Payer: Self-pay | Admitting: Cardiology

## 2021-11-14 ENCOUNTER — Inpatient Hospital Stay (HOSPITAL_COMMUNITY): Payer: PPO

## 2021-11-14 DIAGNOSIS — I200 Unstable angina: Secondary | ICD-10-CM | POA: Diagnosis not present

## 2021-11-14 DIAGNOSIS — R079 Chest pain, unspecified: Secondary | ICD-10-CM

## 2021-11-14 LAB — ECHOCARDIOGRAM COMPLETE
AV Mean grad: 4 mmHg
AV Peak grad: 5.8 mmHg
Ao pk vel: 1.2 m/s
Area-P 1/2: 3 cm2
Calc EF: 54.3 %
Height: 67 in
S' Lateral: 2.3 cm
Single Plane A2C EF: 52.6 %
Single Plane A4C EF: 55.3 %
Weight: 3508.8 oz

## 2021-11-14 LAB — CBC
HCT: 35.6 % — ABNORMAL LOW (ref 39.0–52.0)
Hemoglobin: 11.7 g/dL — ABNORMAL LOW (ref 13.0–17.0)
MCH: 27.5 pg (ref 26.0–34.0)
MCHC: 32.9 g/dL (ref 30.0–36.0)
MCV: 83.8 fL (ref 80.0–100.0)
Platelets: 190 10*3/uL (ref 150–400)
RBC: 4.25 MIL/uL (ref 4.22–5.81)
RDW: 14.7 % (ref 11.5–15.5)
WBC: 4.9 10*3/uL (ref 4.0–10.5)
nRBC: 0 % (ref 0.0–0.2)

## 2021-11-14 LAB — BASIC METABOLIC PANEL
Anion gap: 5 (ref 5–15)
BUN: 16 mg/dL (ref 8–23)
CO2: 25 mmol/L (ref 22–32)
Calcium: 9 mg/dL (ref 8.9–10.3)
Chloride: 105 mmol/L (ref 98–111)
Creatinine, Ser: 1.5 mg/dL — ABNORMAL HIGH (ref 0.61–1.24)
GFR, Estimated: 48 mL/min — ABNORMAL LOW (ref >60)
Glucose, Bld: 128 mg/dL — ABNORMAL HIGH (ref 70–99)
Potassium: 3.8 mmol/L (ref 3.5–5.1)
Sodium: 135 mmol/L (ref 135–145)

## 2021-11-14 LAB — LIPID PANEL
Cholesterol: 124 mg/dL (ref 0–200)
HDL: 32 mg/dL — ABNORMAL LOW (ref >40)
LDL Cholesterol: 59 mg/dL (ref 0–99)
Total CHOL/HDL Ratio: 3.9 RATIO
Triglycerides: 164 mg/dL — ABNORMAL HIGH (ref <150)
VLDL: 33 mg/dL (ref 0–40)

## 2021-11-14 LAB — HEPARIN LEVEL (UNFRACTIONATED): Heparin Unfractionated: 0.71 IU/mL — ABNORMAL HIGH (ref 0.30–0.70)

## 2021-11-14 LAB — TROPONIN I (HIGH SENSITIVITY): Troponin I (High Sensitivity): 6 ng/L (ref <18)

## 2021-11-14 MED ORDER — NITROGLYCERIN IN D5W 200-5 MCG/ML-% IV SOLN
2.0000 ug/min | INTRAVENOUS | Status: DC
  Administered 2021-11-14: 5 ug/min via INTRAVENOUS
  Administered 2021-11-15: 25 ug/min via INTRAVENOUS
  Filled 2021-11-14 (×3): qty 250

## 2021-11-14 MED ORDER — SODIUM CHLORIDE 0.9 % WEIGHT BASED INFUSION
1.0000 mL/kg/h | INTRAVENOUS | Status: DC
  Administered 2021-11-14 – 2021-11-15 (×2): 1 mL/kg/h via INTRAVENOUS

## 2021-11-14 MED ORDER — HEPARIN BOLUS VIA INFUSION
4000.0000 [IU] | Freq: Once | INTRAVENOUS | Status: AC
Start: 2021-11-14 — End: 2021-11-14
  Administered 2021-11-14: 4000 [IU] via INTRAVENOUS

## 2021-11-14 MED ORDER — SODIUM CHLORIDE 0.9% FLUSH
3.0000 mL | Freq: Two times a day (BID) | INTRAVENOUS | Status: DC
  Administered 2021-11-14 – 2021-11-17 (×6): 3 mL via INTRAVENOUS

## 2021-11-14 MED ORDER — HEPARIN (PORCINE) 25000 UT/250ML-% IV SOLN
1000.0000 [IU]/h | INTRAVENOUS | Status: DC
  Administered 2021-11-14: 1100 [IU]/h via INTRAVENOUS
  Administered 2021-11-15: 1000 [IU]/h via INTRAVENOUS
  Filled 2021-11-14 (×2): qty 250

## 2021-11-14 MED ORDER — SODIUM CHLORIDE 0.9 % IV SOLN: 250.0000 mL | INTRAVENOUS | Status: DC | PRN

## 2021-11-14 MED ORDER — SODIUM CHLORIDE 0.9% FLUSH: 3.0000 mL | INTRAVENOUS | Status: DC | PRN

## 2021-11-14 MED ORDER — ASPIRIN 81 MG PO CHEW
81.0000 mg | CHEWABLE_TABLET | ORAL | Status: AC
  Administered 2021-11-15: 81 mg via ORAL
  Filled 2021-11-14: qty 1

## 2021-11-14 MED ORDER — HEPARIN (PORCINE) 25000 UT/250ML-% IV SOLN: 1100.0000 [IU]/h | INTRAVENOUS | Status: DC

## 2021-11-14 NOTE — Progress Notes (Signed)
Patient with continued chest pain throughout the night, resolved with multiple Sl nitro, no new ekg changes.  Started on [employer] him Npo in case we need to cath him today

## 2021-11-15 ENCOUNTER — Inpatient Hospital Stay (HOSPITAL_COMMUNITY): Payer: PPO

## 2021-11-15 ENCOUNTER — Encounter (HOSPITAL_COMMUNITY): Payer: Self-pay | Admitting: Cardiovascular Disease

## 2021-11-15 ENCOUNTER — Encounter (HOSPITAL_COMMUNITY)
Admission: EM | Disposition: A | Discharge status: Home / Self Care | Payer: Self-pay | Source: Home / Self Care | Attending: Cardiology

## 2021-11-15 DIAGNOSIS — I10 Essential (primary) hypertension: Secondary | ICD-10-CM

## 2021-11-15 DIAGNOSIS — R471 Dysarthria and anarthria: Secondary | ICD-10-CM | POA: Diagnosis not present

## 2021-11-15 DIAGNOSIS — R410 Disorientation, unspecified: Secondary | ICD-10-CM | POA: Diagnosis not present

## 2021-11-15 DIAGNOSIS — E785 Hyperlipidemia, unspecified: Secondary | ICD-10-CM | POA: Diagnosis not present

## 2021-11-15 DIAGNOSIS — R4182 Altered mental status, unspecified: Secondary | ICD-10-CM

## 2021-11-15 DIAGNOSIS — I25110 Atherosclerotic heart disease of native coronary artery with unstable angina pectoris: Secondary | ICD-10-CM

## 2021-11-15 DIAGNOSIS — I200 Unstable angina: Secondary | ICD-10-CM | POA: Diagnosis not present

## 2021-11-15 HISTORY — PX: LEFT HEART CATH AND CORONARY ANGIOGRAPHY: CATH118249

## 2021-11-15 HISTORY — PX: CORONARY STENT INTERVENTION: CATH118234

## 2021-11-15 LAB — CBC
HCT: 33.8 % — ABNORMAL LOW (ref 39.0–52.0)
HCT: 40.6 % (ref 39.0–52.0)
Hemoglobin: 11.2 g/dL — ABNORMAL LOW (ref 13.0–17.0)
Hemoglobin: 12.5 g/dL — ABNORMAL LOW (ref 13.0–17.0)
MCH: 28 pg (ref 26.0–34.0)
MCH: 27.7 pg (ref 26.0–34.0)
MCHC: 33.1 g/dL (ref 30.0–36.0)
MCHC: 30.8 g/dL (ref 30.0–36.0)
MCV: 84.5 fL (ref 80.0–100.0)
MCV: 90 fL (ref 80.0–100.0)
Platelets: 173 10*3/uL (ref 150–400)
Platelets: 173 10*3/uL (ref 150–400)
RBC: 4 MIL/uL — ABNORMAL LOW (ref 4.22–5.81)
RBC: 4.51 MIL/uL (ref 4.22–5.81)
RDW: 14.5 % (ref 11.5–15.5)
RDW: 14.7 % (ref 11.5–15.5)
WBC: 4.4 10*3/uL (ref 4.0–10.5)
WBC: 7 10*3/uL (ref 4.0–10.5)
nRBC: 0 % (ref 0.0–0.2)
nRBC: 0 % (ref 0.0–0.2)

## 2021-11-15 LAB — POCT I-STAT 7, (LYTES, BLD GAS, ICA,H+H)
Acid-base deficit: 1 mmol/L (ref 0.0–2.0)
Bicarbonate: 24.1 mmol/L (ref 20.0–28.0)
Calcium, Ion: 1.24 mmol/L (ref 1.15–1.40)
HCT: 33 % — ABNORMAL LOW (ref 39.0–52.0)
Hemoglobin: 11.2 g/dL — ABNORMAL LOW (ref 13.0–17.0)
O2 Saturation: 97 %
Patient temperature: 99.3
Potassium: 4.1 mmol/L (ref 3.5–5.1)
Sodium: 138 mmol/L (ref 135–145)
TCO2: 25 mmol/L (ref 22–32)
pCO2 arterial: 43.4 mmHg (ref 32–48)
pH, Arterial: 7.354 (ref 7.35–7.45)
pO2, Arterial: 99 mmHg (ref 83–108)

## 2021-11-15 LAB — GLUCOSE, CAPILLARY
Glucose-Capillary: 75 mg/dL (ref 70–99)
Glucose-Capillary: 78 mg/dL (ref 70–99)

## 2021-11-15 LAB — CBC WITH DIFFERENTIAL/PLATELET
Abs Immature Granulocytes: 0.02 10*3/uL (ref 0.00–0.07)
Basophils Absolute: 0 10*3/uL (ref 0.0–0.1)
Basophils Relative: 1 %
Eosinophils Absolute: 0.2 10*3/uL (ref 0.0–0.5)
Eosinophils Relative: 3 %
HCT: 40 % (ref 39.0–52.0)
Hemoglobin: 12.5 g/dL — ABNORMAL LOW (ref 13.0–17.0)
Immature Granulocytes: 0 %
Lymphocytes Relative: 13 %
Lymphs Abs: 0.9 10*3/uL (ref 0.7–4.0)
MCH: 27 pg (ref 26.0–34.0)
MCHC: 31.3 g/dL (ref 30.0–36.0)
MCV: 86.4 fL (ref 80.0–100.0)
Monocytes Absolute: 0.6 10*3/uL (ref 0.1–1.0)
Monocytes Relative: 9 %
Neutro Abs: 5.3 10*3/uL (ref 1.7–7.7)
Neutrophils Relative %: 74 %
Platelets: 218 10*3/uL (ref 150–400)
RBC: 4.63 MIL/uL (ref 4.22–5.81)
RDW: 15 % (ref 11.5–15.5)
WBC: 7.1 10*3/uL (ref 4.0–10.5)
nRBC: 0 % (ref 0.0–0.2)

## 2021-11-15 LAB — BASIC METABOLIC PANEL
Anion gap: 7 (ref 5–15)
Anion gap: 12 (ref 5–15)
Anion gap: 7 (ref 5–15)
BUN: 19 mg/dL (ref 8–23)
BUN: 16 mg/dL (ref 8–23)
BUN: 18 mg/dL (ref 8–23)
CO2: 26 mmol/L (ref 22–32)
CO2: 22 mmol/L (ref 22–32)
CO2: 24 mmol/L (ref 22–32)
Calcium: 8.6 mg/dL — ABNORMAL LOW (ref 8.9–10.3)
Calcium: 8.5 mg/dL — ABNORMAL LOW (ref 8.9–10.3)
Calcium: 8.7 mg/dL — ABNORMAL LOW (ref 8.9–10.3)
Chloride: 107 mmol/L (ref 98–111)
Chloride: 102 mmol/L (ref 98–111)
Chloride: 107 mmol/L (ref 98–111)
Creatinine, Ser: 1.52 mg/dL — ABNORMAL HIGH (ref 0.61–1.24)
Creatinine, Ser: 1.5 mg/dL — ABNORMAL HIGH (ref 0.61–1.24)
Creatinine, Ser: 1.5 mg/dL — ABNORMAL HIGH (ref 0.61–1.24)
GFR, Estimated: 47 mL/min — ABNORMAL LOW (ref >60)
GFR, Estimated: 48 mL/min — ABNORMAL LOW (ref >60)
GFR, Estimated: 48 mL/min — ABNORMAL LOW (ref >60)
Glucose, Bld: 103 mg/dL — ABNORMAL HIGH (ref 70–99)
Glucose, Bld: 85 mg/dL (ref 70–99)
Glucose, Bld: 94 mg/dL (ref 70–99)
Potassium: 3.8 mmol/L (ref 3.5–5.1)
Potassium: 4 mmol/L (ref 3.5–5.1)
Potassium: 5 mmol/L (ref 3.5–5.1)
Sodium: 140 mmol/L (ref 135–145)
Sodium: 136 mmol/L (ref 135–145)
Sodium: 138 mmol/L (ref 135–145)

## 2021-11-15 LAB — MRSA NEXT GEN BY PCR, NASAL: MRSA by PCR Next Gen: NOT DETECTED

## 2021-11-15 LAB — POCT ACTIVATED CLOTTING TIME: Activated Clotting Time: 323 seconds

## 2021-11-15 LAB — TSH: TSH: 2.585 u[IU]/mL (ref 0.350–4.500)

## 2021-11-15 LAB — HEPARIN LEVEL (UNFRACTIONATED): Heparin Unfractionated: 0.59 IU/mL (ref 0.30–0.70)

## 2021-11-15 LAB — TROPONIN I (HIGH SENSITIVITY): Troponin I (High Sensitivity): 6227 ng/L (ref <18)

## 2021-11-15 LAB — VITAMIN B12: Vitamin B-12: 192 pg/mL (ref 180–914)

## 2021-11-15 LAB — AMMONIA: Ammonia: 41 umol/L — ABNORMAL HIGH (ref 9–35)

## 2021-11-15 LAB — LIPOPROTEIN A (LPA): Lipoprotein (a): 31.1 nmol/L — ABNORMAL HIGH (ref <75.0)

## 2021-11-15 SURGERY — LEFT HEART CATH AND CORONARY ANGIOGRAPHY
Anesthesia: LOCAL

## 2021-11-15 MED ORDER — NITROGLYCERIN 1 MG/10 ML FOR IR/CATH LAB
INTRA_ARTERIAL | Status: AC
  Filled 2021-11-15: qty 10

## 2021-11-15 MED ORDER — FENTANYL CITRATE (PF) 100 MCG/2ML IJ SOLN
INTRAMUSCULAR | Status: AC
  Filled 2021-11-15: qty 2

## 2021-11-15 MED ORDER — ACETAMINOPHEN 325 MG PO TABS: 650.0000 mg | ORAL_TABLET | ORAL | Status: DC | PRN

## 2021-11-15 MED ORDER — VERAPAMIL HCL 2.5 MG/ML IV SOLN
INTRA_ARTERIAL | Status: DC | PRN
  Administered 2021-11-15: 15 mL via INTRA_ARTERIAL

## 2021-11-15 MED ORDER — MIDAZOLAM HCL 2 MG/2ML IJ SOLN
INTRAMUSCULAR | Status: DC | PRN
  Administered 2021-11-15: 1 mg via INTRAVENOUS

## 2021-11-15 MED ORDER — LIDOCAINE HCL (PF) 1 % IJ SOLN
INTRAMUSCULAR | Status: AC
  Filled 2021-11-15: qty 30

## 2021-11-15 MED ORDER — DEXTROSE 5 % IV SOLN
2000.0000 mg | Freq: Once | INTRAVENOUS | Status: AC
  Administered 2021-11-15: 2000 mg via INTRAVENOUS
  Filled 2021-11-15: qty 20

## 2021-11-15 MED ORDER — ORAL CARE MOUTH RINSE: 15.0000 mL | OROMUCOSAL | Status: DC | PRN

## 2021-11-15 MED ORDER — IOHEXOL 350 MG/ML SOLN
INTRAVENOUS | Status: DC | PRN
  Administered 2021-11-15: 180 mL

## 2021-11-15 MED ORDER — SODIUM CHLORIDE 0.9% IV SOLUTION: Freq: Once | INTRAVENOUS | Status: DC

## 2021-11-15 MED ORDER — ATROPINE SULFATE 1 MG/10ML IJ SOSY
PREFILLED_SYRINGE | INTRAMUSCULAR | Status: DC | PRN
  Administered 2021-11-15 (×3): 1 mg via INTRAVENOUS

## 2021-11-15 MED ORDER — FAMOTIDINE IN NACL 20-0.9 MG/50ML-% IV SOLN
INTRAVENOUS | Status: AC
  Filled 2021-11-15: qty 50

## 2021-11-15 MED ORDER — HEPARIN (PORCINE) IN NACL 1000-0.9 UT/500ML-% IV SOLN
INTRAVENOUS | Status: DC | PRN
  Administered 2021-11-15 (×2): 500 mL

## 2021-11-15 MED ORDER — SODIUM CHLORIDE 0.9 % IV SOLN: 250.0000 mL | INTRAVENOUS | Status: DC | PRN

## 2021-11-15 MED ORDER — HEPARIN SODIUM (PORCINE) 1000 UNIT/ML IJ SOLN
INTRAMUSCULAR | Status: AC
  Filled 2021-11-15: qty 10

## 2021-11-15 MED ORDER — HEPARIN SODIUM (PORCINE) 1000 UNIT/ML IJ SOLN
INTRAMUSCULAR | Status: DC | PRN
  Administered 2021-11-15 (×2): 5000 [IU] via INTRAVENOUS

## 2021-11-15 MED ORDER — HYDRALAZINE HCL 20 MG/ML IJ SOLN: 10.0000 mg | INTRAMUSCULAR | Status: AC | PRN

## 2021-11-15 MED ORDER — SODIUM CHLORIDE 0.9 % IV SOLN
INTRAVENOUS | Status: DC
Start: 2021-11-15 — End: 2021-11-15

## 2021-11-15 MED ORDER — SODIUM CHLORIDE 0.9 % IV SOLN: INTRAVENOUS | Status: AC

## 2021-11-15 MED ORDER — CHLORHEXIDINE GLUCONATE CLOTH 2 % EX PADS
6.0000 | MEDICATED_PAD | Freq: Every day | CUTANEOUS | Status: DC
  Administered 2021-11-15 – 2021-11-18 (×5): 6 via TOPICAL

## 2021-11-15 MED ORDER — SODIUM CHLORIDE 0.9% FLUSH: 3.0000 mL | INTRAVENOUS | Status: DC | PRN

## 2021-11-15 MED ORDER — ONDANSETRON HCL 4 MG/2ML IJ SOLN: 4.0000 mg | Freq: Four times a day (QID) | INTRAMUSCULAR | Status: DC | PRN

## 2021-11-15 MED ORDER — FAMOTIDINE IN NACL 20-0.9 MG/50ML-% IV SOLN
INTRAVENOUS | Status: AC | PRN
  Administered 2021-11-15: 20 mg via INTRAVENOUS

## 2021-11-15 MED ORDER — ATROPINE SULFATE 1 MG/10ML IJ SOSY
PREFILLED_SYRINGE | INTRAMUSCULAR | Status: AC
  Filled 2021-11-15: qty 30

## 2021-11-15 MED ORDER — NOREPINEPHRINE 4 MG/250ML-% IV SOLN
INTRAVENOUS | Status: AC
  Filled 2021-11-15: qty 250

## 2021-11-15 MED ORDER — CLOPIDOGREL BISULFATE 75 MG PO TABS
75.0000 mg | ORAL_TABLET | Freq: Every day | ORAL | Status: DC
  Administered 2021-11-16 – 2021-11-18 (×3): 75 mg via ORAL
  Filled 2021-11-15 (×3): qty 1

## 2021-11-15 MED ORDER — NALOXONE HCL 0.4 MG/ML IJ SOLN
INTRAMUSCULAR | Status: AC
  Administered 2021-11-15: 0.4 mg
  Filled 2021-11-15: qty 1

## 2021-11-15 MED ORDER — SODIUM CHLORIDE 0.9% FLUSH
3.0000 mL | Freq: Two times a day (BID) | INTRAVENOUS | Status: DC
  Administered 2021-11-15 – 2021-11-17 (×5): 3 mL via INTRAVENOUS

## 2021-11-15 MED ORDER — LORAZEPAM 2 MG/ML IJ SOLN
INTRAMUSCULAR | Status: AC
  Filled 2021-11-15: qty 1

## 2021-11-15 MED ORDER — HALOPERIDOL LACTATE 5 MG/ML IJ SOLN: 1.0000 mg | Freq: Four times a day (QID) | INTRAMUSCULAR | Status: DC | PRN

## 2021-11-15 MED ORDER — LABETALOL HCL 5 MG/ML IV SOLN
10.0000 mg | INTRAVENOUS | Status: AC | PRN
  Administered 2021-11-15: 10 mg via INTRAVENOUS
  Filled 2021-11-15: qty 4

## 2021-11-15 MED ORDER — VERAPAMIL HCL 2.5 MG/ML IV SOLN
INTRAVENOUS | Status: AC
  Filled 2021-11-15: qty 2

## 2021-11-15 MED ORDER — FENTANYL CITRATE (PF) 100 MCG/2ML IJ SOLN
INTRAMUSCULAR | Status: DC | PRN
  Administered 2021-11-15 (×5): 25 ug via INTRAVENOUS

## 2021-11-15 MED ORDER — MIDAZOLAM HCL 2 MG/2ML IJ SOLN
INTRAMUSCULAR | Status: AC
  Filled 2021-11-15: qty 2

## 2021-11-15 MED ORDER — NOREPINEPHRINE BITARTRATE 1 MG/ML IV SOLN
INTRAVENOUS | Status: DC | PRN
  Administered 2021-11-15: 10 ug/min via INTRAVENOUS

## 2021-11-15 MED ORDER — CLOPIDOGREL BISULFATE 300 MG PO TABS
ORAL_TABLET | ORAL | Status: AC
  Filled 2021-11-15: qty 1

## 2021-11-15 MED ORDER — ROSUVASTATIN CALCIUM 20 MG PO TABS
40.0000 mg | ORAL_TABLET | Freq: Every day | ORAL | Status: DC
  Administered 2021-11-16 – 2021-11-18 (×3): 40 mg via ORAL
  Filled 2021-11-15 (×2): qty 2
  Filled 2021-11-15: qty 8
  Filled 2021-11-15: qty 2

## 2021-11-15 MED ORDER — DIPHENHYDRAMINE HCL 50 MG/ML IJ SOLN
INTRAMUSCULAR | Status: AC
  Filled 2021-11-15: qty 1

## 2021-11-15 MED ORDER — HEPARIN (PORCINE) IN NACL 1000-0.9 UT/500ML-% IV SOLN
INTRAVENOUS | Status: AC
  Filled 2021-11-15: qty 1000

## 2021-11-15 MED ORDER — DIPHENHYDRAMINE HCL 50 MG/ML IJ SOLN
25.0000 mg | Freq: Once | INTRAMUSCULAR | Status: AC
  Administered 2021-11-15: 25 mg via INTRAVENOUS

## 2021-11-15 MED ORDER — ASPIRIN 81 MG PO CHEW: 81.0000 mg | CHEWABLE_TABLET | Freq: Every day | ORAL | Status: DC

## 2021-11-15 MED ORDER — LORAZEPAM 2 MG/ML IJ SOLN
1.0000 mg | Freq: Once | INTRAMUSCULAR | Status: AC
  Administered 2021-11-15: 1 mg via INTRAVENOUS

## 2021-11-15 MED ORDER — CLOPIDOGREL BISULFATE 300 MG PO TABS
ORAL_TABLET | ORAL | Status: DC | PRN
  Administered 2021-11-15: 300 mg via ORAL

## 2021-11-15 MED ORDER — IOHEXOL 350 MG/ML SOLN
115.0000 mL | Freq: Once | INTRAVENOUS | Status: AC | PRN
  Administered 2021-11-15: 115 mL via INTRAVENOUS

## 2021-11-15 MED ORDER — DIPHENHYDRAMINE HCL 25 MG PO CAPS
25.0000 mg | ORAL_CAPSULE | Freq: Once | ORAL | Status: AC
  Administered 2021-11-15: 25 mg via ORAL
  Filled 2021-11-15: qty 1

## 2021-11-15 MED ORDER — LIDOCAINE HCL (PF) 1 % IJ SOLN
INTRAMUSCULAR | Status: DC | PRN
  Administered 2021-11-15: 2 mL

## 2021-11-15 SURGICAL SUPPLY — 26 items
BALL SAPPHIRE NC24 3.0X15 (BALLOONS) ×2
BALLN SAPPHIRE 2.5X12 (BALLOONS) ×2
BALLOON SAPPHIRE 2.5X12 (BALLOONS) IMPLANT
BALLOON SAPPHIRE NC24 3.0X15 (BALLOONS) IMPLANT
CATH INFINITI JR4 5F (CATHETERS) ×1 IMPLANT
CATH LAUNCHER 6FR AL.75 (CATHETERS) ×1 IMPLANT
CATH LAUNCHER 6FR AL1 SH (CATHETERS) IMPLANT
CATH OPTITORQUE TIG 4.0 5F (CATHETERS) ×1 IMPLANT
CATHETER LAUNCHER 6FR AL1 SH (CATHETERS) ×2
ELECT DEFIB PAD ADLT CADENCE (PAD) ×1 IMPLANT
GLIDESHEATH SLEND A-KIT 6F 22G (SHEATH) ×1 IMPLANT
GUIDEWIRE INQWIRE 1.5J.035X260 (WIRE) IMPLANT
INQWIRE 1.5J .035X260CM (WIRE) ×2
KIT ENCORE 26 ADVANTAGE (KITS) ×1 IMPLANT
KIT HEART LEFT (KITS) ×3 IMPLANT
PACK CARDIAC CATHETERIZATION (CUSTOM PROCEDURE TRAY) ×3 IMPLANT
STENT SYNERGY XD 2.50X32 (Permanent Stent) IMPLANT
STENT SYNERGY XD 3.0X12 (Permanent Stent) IMPLANT
STENT SYNERGY XD 3.0X16 (Permanent Stent) IMPLANT
SYNERGY XD 2.50X32 (Permanent Stent) ×2 IMPLANT
SYNERGY XD 3.0X12 (Permanent Stent) ×2 IMPLANT
SYNERGY XD 3.0X16 (Permanent Stent) ×2 IMPLANT
TRANSDUCER W/STOPCOCK (MISCELLANEOUS) ×3 IMPLANT
TUBING CIL FLEX 10 FLL-RA (TUBING) ×3 IMPLANT
WIRE ASAHI PROWATER 180CM (WIRE) ×1 IMPLANT
WIRE HI TORQ VERSACORE-J 145CM (WIRE) ×1 IMPLANT

## 2021-11-15 NOTE — Progress Notes (Signed)
EEG complete - results pending 

## 2021-11-15 NOTE — Consult Note (Addendum)
Neurology Consultation  Reason for Consult: code stroke  Referring Physician: Anne Fu, MD  CC: aphasia, confusion, left sided drift  History is obtained from:chart, patient's spouse, RN  HPI: Jeremy [name] is a 76 y.o. male past medical history of prior right parietal stroke with no residual deficits, ongoing cognitive deficits per the family member at bedside, CAD, right carotid stenosis, small cell and basal cell skin cancers, hypertension, hypercholesterolemia, history of seizures not on antiepileptics due to cognitive side effects-admitted for cardiac catheterization this morning with last known well at 9 AM which was prior to his cardiac catheterization, noted to be aphasic and confused along with some left-sided drift when examined by the bedside staff this evening in the [facility] ICU. Patient unable to provide meaningful history.  History obtained from bedside RN, chart review and the patient's family member. Last known well according to the family member was prior to going to the cath around 9 or 9:20 AM when he was laughing and making jokes.  After that he has been somnolent and somewhat agitated.  Got 1 dose of opiate as well as Benadryl and had been mostly drowsy.  Upon awakening and questioning, he was unable to form words properly and had slurred speech.  Also noted to have some left-sided drift by the bedside team.  I was called by the rapid response RN with the above findings.  Last known well being within 24 hours and symptoms of large vessel occlusion+/- prompted me to recommend activating a code stroke for emergent stroke evaluation and stroke imaging. Detailed examination documented below. Outside the window for tPA due to last known well at 9 AM which is about 11 hours ago. Due to history of carotid stenosis, would pursue further vessel imaging No seizures in the past 3 years per family member.  The family member reported him taking his antiepileptics but I do not see any documented in  his medication list.  Last note from Dr. Pearlean Brownie in the clinic reports that he was off of Dilantin Keppra and Vimpat due to cognitive side effects. Wife reports that he has been "snappy" with him over the past few weeks and has been acting differently than his personality.  His past admission with a possible seizure and an incidental stroke was with confusion, for which he got extensive imaging as well as spinal tap.  LKW: 9 AM tpa given?: no, outside the window Premorbid modified Rankin scale (mRS): 2   ROS: Unable to obtain due to altered mental status.   Past Medical History:  Diagnosis Date   Arthritis    Basal cell carcinoma 02/11/2021   nod- left dorsal hand (EXC)   CAD (coronary artery disease)    a. s/p prior stenting of LAD and RCA b. 03/2019: NSTEMI and required PTCA/DESx1 to the LAD and successful scoring balloon angioplasty in the previously stented segment of LCx   Carotid stenosis, asymptomatic, right    Carpal tunnel syndrome    bilateral   Coronary artery disease    GERD (gastroesophageal reflux disease)    History of kidney stones    Hx of colonic polyp    Hypercholesterolemia    Hypertension    Myocardial infarction (HCC)    hx of   Obesity    SCC (squamous cell carcinoma) 02/11/2021   in situ- right temple ([id number])   Squamous cell carcinoma of skin 08/09/2016   well differentiated on right outer eye - tx p bx   Stroke [facility])    Family  History  Problem Relation Age of Onset   Other Father 83       deceased old age   Coronary artery disease Mother 12       deceased   Hypertension Mother    Other Sister 52       living and healthy   Coronary artery disease Brother    Skin cancer Brother    Colon cancer Neg Hx    Pancreatic cancer Neg Hx    Stomach cancer Neg Hx    Esophageal cancer Neg Hx    Liver disease Neg Hx    Social History:   reports that he quit smoking about 33 years ago. His smoking use included cigarettes. He has a 40.00 pack-year smoking  history. He has never used smokeless tobacco. He reports that he does not drink alcohol and does not use drugs.  Medications  Current Facility-Administered Medications:    0.9 %  sodium chloride infusion (Manually program via Guardrails IV Fluids), , Intravenous, Once, Runell Gess, MD, Stopped at 11/15/21 1213   0.9 %  sodium chloride infusion, , Intravenous, Continuous, Runell Gess, MD, Last Rate: 10 mL/hr at 11/15/21 1800, Infusion Verify at 11/15/21 1800   0.9 %  sodium chloride infusion, , Intravenous, Continuous, Runell Gess, MD, Last Rate: 75 mL/hr at 11/15/21 1800, Infusion Verify at 11/15/21 1800   0.9 %  sodium chloride infusion, 250 mL, Intravenous, PRN, Runell Gess, MD   0.9 %  sodium chloride infusion, , Intravenous, Continuous, Milon Dikes, MD   acetaminophen (TYLENOL) tablet 650 mg, 650 mg, Oral, Q4H PRN, Runell Gess, MD, 650 mg at 11/14/21 0825   albuterol (PROVENTIL) (2.5 MG/3ML) 0.083% nebulizer solution 2.5 mg, 2.5 mg, Nebulization, Q6H PRN, Runell Gess, MD, 2.5 mg at 11/15/21 1445   aspirin EC tablet 81 mg, 81 mg, Oral, Daily, Runell Gess, MD, 81 mg at 11/14/21 0826   carvedilol (COREG) tablet 12.5 mg, 12.5 mg, Oral, BID WC, Runell Gess, MD, 12.5 mg at 11/15/21 0841   Chlorhexidine Gluconate Cloth 2 % PADS 6 each, 6 each, Topical, Daily, Jake Bathe, MD, 6 each at 11/15/21 1200   [START ON 11/16/2021] clopidogrel (PLAVIX) tablet 75 mg, 75 mg, Oral, Q breakfast, Runell Gess, MD   cycloSPORINE (RESTASIS) 0.05 % ophthalmic emulsion 1 drop, 1 drop, Both Eyes, BID, Runell Gess, MD, 1 drop at 11/15/21 0846   diphenhydrAMINE (BENADRYL) injection 25 mg, 25 mg, Intravenous, Once, Milon Dikes, MD   gabapentin (NEURONTIN) capsule 300 mg, 300 mg, Oral, TID, Runell Gess, MD, 300 mg at 11/15/21 0841   haloperidol lactate (HALDOL) injection 1-2 mg, 1-2 mg, Intravenous, Q6H PRN, Tonny Bollman, MD   isosorbide mononitrate  (IMDUR) 24 hr tablet 30 mg, 30 mg, Oral, Daily, Runell Gess, MD, 30 mg at 11/15/21 0841   losartan (COZAAR) tablet 25 mg, 25 mg, Oral, Daily, Runell Gess, MD, 25 mg at 11/15/21 0843   nitroGLYCERIN (NITROSTAT) SL tablet 0.4 mg, 0.4 mg, Sublingual, Q5 Min x 3 PRN, Runell Gess, MD, 0.4 mg at 11/13/21 2253   nitroGLYCERIN 50 mg in dextrose 5 % 250 mL (0.2 mg/mL) infusion, 2-200 mcg/min, Intravenous, Titrated, Runell Gess, MD, Last Rate: 7.5 mL/hr at 11/15/21 0747, 25 mcg/min at 11/15/21 0747   nystatin (MYCOSTATIN/NYSTOP) topical powder 1 Application, 1 Application, Topical, BID, Runell Gess, MD, 1 Application at 11/14/21 0830   ondansetron (ZOFRAN) injection 4 mg, 4  mg, Intravenous, Q6H PRN, Runell Gess, MD   Oral care mouth rinse, 15 mL, Mouth Rinse, PRN, Jake Bathe, MD   oxyCODONE (Oxy IR/ROXICODONE) immediate release tablet 15 mg, 15 mg, Oral, Q6H PRN, Runell Gess, MD, 15 mg at 11/15/21 1144   pantoprazole (PROTONIX) EC tablet 40 mg, 40 mg, Oral, Daily, Runell Gess, MD, 40 mg at 11/15/21 0841   rosuvastatin (CRESTOR) tablet 40 mg, 40 mg, Oral, Daily, Runell Gess, MD   sodium chloride flush (NS) 0.9 % injection 3 mL, 3 mL, Intravenous, Q12H, Runell Gess, MD, 3 mL at 11/14/21 2202   sodium chloride flush (NS) 0.9 % injection 3 mL, 3 mL, Intravenous, Q12H, Runell Gess, MD, 3 mL at 11/15/21 1437   sodium chloride flush (NS) 0.9 % injection 3 mL, 3 mL, Intravenous, PRN, Runell Gess, MD  Exam: Current vital signs: BP 132/90   Pulse 78   Temp (!) 97.4 F (36.3 C) (Oral)   Resp (!) 23   Ht 5\' 7"  (1.702 m)   Wt 99.5 kg   SpO2 99%   BMI 34.35 kg/m  Vital signs in last 24 hours: Temp:  [97.4 F (36.3 C)-98.1 F (36.7 C)] 97.4 F (36.3 C) (06/26 0745) Pulse Rate:  [28-92] 78 (06/26 1935) Resp:  [9-23] 23 (06/26 1935) BP: (80-173)/(45-141) 132/90 (06/26 1935) SpO2:  [9 %-100 %] 99 % (06/26 1935) General: Awake alert  somewhat restless HEENT: Normocephalic atraumatic CVs: Regular rhythm Abdomen nondistended nontender Extremities warm well perfused Neurological exam Awake alert somewhat restless Speech is moderately dysarthric He is able to tell me his name Able to tell me his age correctly Not able to tell me the date or time Able to follow simple commands No evidence of gross aphasia although speech becomes mumbled and incomprehensible intermittently.   Cranial nerves II to XII grossly intact Motor examination with no drift in any of the force Sensation difficult to assess but no gross focality Coordination with no dysmetria or asterixis NIH stroke scale 1a Level of Conscious.: 0 1b LOC Questions: 1 1c LOC Commands: 0 2 Best Gaze: 0 3 Visual: 0 4 Facial Palsy: 0 5a Motor Arm - left: 0 5b Motor Arm - Right: 0 6a Motor Leg - Left: 0 6b Motor Leg - Right: 0 7 Limb Ataxia: 0 8 Sensory: 0 9 Best Language: 0 10 Dysarthria: 1 11 Extinct. and Inatten.: 0 TOTAL: 2  Labs I have reviewed labs in epic and the results pertinent to this consultation are: Stat labs ordered.  Morning labs with creatinine 1.5  CBC    Component Value Date/Time   WBC 4.4 11/15/2021 0359   RBC 4.00 (L) 11/15/2021 0359   HGB 11.2 (L) 11/15/2021 0359   HGB 13.1 02/11/2019 0847   HCT 33.8 (L) 11/15/2021 0359   HCT 40.0 02/11/2019 0847   PLT 173 11/15/2021 0359   PLT 201 02/11/2019 0847   MCV 84.5 11/15/2021 0359   MCV 94 02/11/2019 0847   MCH 28.0 11/15/2021 0359   MCHC 33.1 11/15/2021 0359   RDW 14.5 11/15/2021 0359   RDW 12.9 02/11/2019 0847   LYMPHSABS 1.3 09/20/2020 0141   MONOABS 0.6 09/20/2020 0141   EOSABS 0.3 09/20/2020 0141   BASOSABS 0.0 09/20/2020 0141    CMP     Component Value Date/Time   NA 140 11/15/2021 0359   NA 141 02/11/2019 0847   K 3.8 11/15/2021 0359   CL 107 11/15/2021 0359  CO2 26 11/15/2021 0359   GLUCOSE 103 (H) 11/15/2021 0359   BUN 19 11/15/2021 0359   BUN 30 (H)  02/11/2019 0847   CREATININE 1.52 (H) 11/15/2021 0359   CALCIUM 8.6 (L) 11/15/2021 0359   PROT 5.5 (L) 09/20/2020 0141   PROT 5.9 (L) 02/11/2019 0847   ALBUMIN 3.1 (L) 09/20/2020 0141   ALBUMIN 4.0 02/11/2019 0847   AST 18 09/20/2020 0141   ALT 20 09/20/2020 0141   ALKPHOS 41 09/20/2020 0141   BILITOT 0.3 09/20/2020 0141   BILITOT 0.3 02/11/2019 0847   GFRNONAA 47 (L) 11/15/2021 0359   GFRAA >60 06/26/2019 1143    Lipid Panel     Component Value Date/Time   CHOL 124 11/14/2021 0245   CHOL 125 02/11/2019 0847   TRIG 164 (H) 11/14/2021 0245   HDL 32 (L) 11/14/2021 0245   HDL 45 02/11/2019 0847   CHOLHDL 3.9 11/14/2021 0245   VLDL 33 11/14/2021 0245   LDLCALC 59 11/14/2021 0245   LDLCALC 64 02/11/2019 0847     Imaging I have reviewed the images obtained:  CT-head: Unchanged from prior-encephalomalacia in right parietal lobe unchanged from prior CT.  No bleed.  Extensive motion artifact CT angiography of the head, CT angiography neck-no acute findings-extensive motion artifact.  Assessment:  76 year old man with above past medical history, status post cardiac cath today after which she has been noted to have some confusion, dysarthria, nursing report of aphasia although on my exam he did not appear very aphasic, he was also noted to have some left-sided drift. He was given some Narcan because he had gotten opiates earlier in the day with some improvement in his speech but still remains very dysarthric and appears very combative and agitated. Due to prior history of strokes and seizures, code stroke was activated, taken for stat imaging which was unremarkable-CT head and CT angio with no acute findings Will obtain CT perfusion Given history of prior seizures, would like to rule out ongoing seizures/status epilepticus.  Presumably was unable to tolerate his antiepileptics in the past due to cognitive side effects and the cognitive side effects have been worsening over the past few  weeks to months according to family member but in the acute situation, will try Depakote as it also has some mood stabilization properties.  Impression: Acute change in mentation concerning for possible stroke versus seizure/status epilepticus Agitated delirium due to underlying cognitive deficits and acute hospitalization ? Toxic metabolic encephalopathy Less likely CNS infection  Recommendations: CT perfusion study of the head Check labs again-CBC, BMP, ammonia, TSH, B12. No evidence clinically of CNS infection-no need for LP at this time. Needs an MRI when able to lay still. Exam more consistent with agitated delirium in a patient with possibly underlying cognitive deficits without a formal diagnosis of what I suspect is dementia-multifactorial vascular versus Alzheimer's. Given prior history of seizures, loaded with Depakote. Stat EEG   Addendum-after CT perfusion. CT perfusion study obtained but is unusable due to poor image acquisition due to motion artifact. Given no gross LVO, not a candidate for emergent stroke intervention. Differentials remain as above, for which he will need MRI when stable and able to tolerate. Stat EEG has been ordered-technologist at bedside. Would also recommend checking troponins and arterial blood gases at this time. Load with Depakote.  Continue Depakote 500 twice daily. With stroke remaining in the differentials, would allow permissive hypertension at least for the next 24 to 48 hours and treat only if systolic blood  pressures greater than 220, unless there is a cardiac reason to treat the blood pressure. Neurology will follow.  -- Milon Dikes, MD Neurologist [facility] Pager: [phone]   CRITICAL CARE ATTESTATION Performed by: Milon Dikes, MD Total critical care time: 60 minutes Critical care time was exclusive of separately billable procedures and treating other patients and/or supervising APPs/Residents/Students Critical  care was necessary to treat or prevent imminent or life-threatening deterioration due to strokelike symptoms, rule out seizures/status epilepticus This patient is critically ill and at significant risk for neurological worsening and/or death and care requires constant monitoring. Critical care was time spent personally by me on the following activities: development of treatment plan with patient and/or surrogate as well as nursing, discussions with consultants, evaluation of patient's response to treatment, examination of patient, obtaining history from patient or surrogate, ordering and performing treatments and interventions, ordering and review of laboratory studies, ordering and review of radiographic studies, pulse oximetry, re-evaluation of patient's condition, participation in multidisciplinary rounds and medical decision making of high complexity in the care of this patient.

## 2021-11-16 ENCOUNTER — Inpatient Hospital Stay (HOSPITAL_COMMUNITY): Payer: PPO

## 2021-11-16 DIAGNOSIS — I639 Cerebral infarction, unspecified: Secondary | ICD-10-CM

## 2021-11-16 DIAGNOSIS — Z9862 Peripheral vascular angioplasty status: Secondary | ICD-10-CM

## 2021-11-16 DIAGNOSIS — I200 Unstable angina: Secondary | ICD-10-CM | POA: Diagnosis not present

## 2021-11-16 LAB — BASIC METABOLIC PANEL
Anion gap: 11 (ref 5–15)
BUN: 16 mg/dL (ref 8–23)
CO2: 23 mmol/L (ref 22–32)
Calcium: 8.7 mg/dL — ABNORMAL LOW (ref 8.9–10.3)
Chloride: 106 mmol/L (ref 98–111)
Creatinine, Ser: 1.62 mg/dL — ABNORMAL HIGH (ref 0.61–1.24)
GFR, Estimated: 44 mL/min — ABNORMAL LOW (ref >60)
Glucose, Bld: 91 mg/dL (ref 70–99)
Potassium: 4.6 mmol/L (ref 3.5–5.1)
Sodium: 140 mmol/L (ref 135–145)

## 2021-11-16 LAB — CBC
HCT: 40.4 % (ref 39.0–52.0)
Hemoglobin: 11.9 g/dL — ABNORMAL LOW (ref 13.0–17.0)
MCH: 26.8 pg (ref 26.0–34.0)
MCHC: 29.5 g/dL — ABNORMAL LOW (ref 30.0–36.0)
MCV: 91 fL (ref 80.0–100.0)
Platelets: 182 10*3/uL (ref 150–400)
RBC: 4.44 MIL/uL (ref 4.22–5.81)
RDW: 14.8 % (ref 11.5–15.5)
WBC: 6 10*3/uL (ref 4.0–10.5)
nRBC: 0 % (ref 0.0–0.2)

## 2021-11-16 LAB — GLUCOSE, CAPILLARY: Glucose-Capillary: 93 mg/dL (ref 70–99)

## 2021-11-16 MED ORDER — DEXMEDETOMIDINE HCL IN NACL 400 MCG/100ML IV SOLN
0.4000 ug/kg/h | INTRAVENOUS | Status: DC
  Administered 2021-11-16: 0.4 ug/kg/h via INTRAVENOUS

## 2021-11-16 MED ORDER — METOPROLOL SUCCINATE ER 25 MG PO TB24
12.5000 mg | ORAL_TABLET | Freq: Every evening | ORAL | Status: DC
  Administered 2021-11-16 – 2021-11-17 (×2): 12.5 mg via ORAL
  Filled 2021-11-16 (×2): qty 1

## 2021-11-16 MED ORDER — LOSARTAN POTASSIUM 25 MG PO TABS
25.0000 mg | ORAL_TABLET | Freq: Every evening | ORAL | Status: DC
  Administered 2021-11-16 – 2021-11-17 (×2): 25 mg via ORAL
  Filled 2021-11-16 (×2): qty 1

## 2021-11-16 MED ORDER — DIVALPROEX SODIUM 250 MG PO DR TAB
500.0000 mg | DELAYED_RELEASE_TABLET | Freq: Two times a day (BID) | ORAL | Status: DC
  Administered 2021-11-16 – 2021-11-18 (×5): 500 mg via ORAL
  Filled 2021-11-16: qty 1
  Filled 2021-11-16: qty 2
  Filled 2021-11-16: qty 1
  Filled 2021-11-16: qty 2
  Filled 2021-11-16 (×2): qty 1

## 2021-11-16 MED ORDER — ORAL CARE MOUTH RINSE: 15.0000 mL | OROMUCOSAL | Status: DC | PRN

## 2021-11-16 MED ORDER — ISOSORBIDE MONONITRATE ER 30 MG PO TB24
30.0000 mg | ORAL_TABLET | Freq: Every evening | ORAL | Status: DC
  Administered 2021-11-16 – 2021-11-17 (×2): 30 mg via ORAL
  Filled 2021-11-16 (×2): qty 1

## 2021-11-16 MED ORDER — DEXMEDETOMIDINE HCL IN NACL 400 MCG/100ML IV SOLN
INTRAVENOUS | Status: AC
  Filled 2021-11-16: qty 100

## 2021-11-17 DIAGNOSIS — Z8673 Personal history of transient ischemic attack (TIA), and cerebral infarction without residual deficits: Secondary | ICD-10-CM

## 2021-11-17 DIAGNOSIS — I25119 Atherosclerotic heart disease of native coronary artery with unspecified angina pectoris: Secondary | ICD-10-CM | POA: Diagnosis not present

## 2021-11-17 DIAGNOSIS — I6340 Cerebral infarction due to embolism of unspecified cerebral artery: Secondary | ICD-10-CM | POA: Diagnosis not present

## 2021-11-17 DIAGNOSIS — Z87898 Personal history of other specified conditions: Secondary | ICD-10-CM

## 2021-11-17 DIAGNOSIS — I200 Unstable angina: Secondary | ICD-10-CM | POA: Diagnosis not present

## 2021-11-17 DIAGNOSIS — Z955 Presence of coronary angioplasty implant and graft: Secondary | ICD-10-CM

## 2021-11-17 LAB — CBC
HCT: 35.9 % — ABNORMAL LOW (ref 39.0–52.0)
Hemoglobin: 11.7 g/dL — ABNORMAL LOW (ref 13.0–17.0)
MCH: 27.6 pg (ref 26.0–34.0)
MCHC: 32.6 g/dL (ref 30.0–36.0)
MCV: 84.7 fL (ref 80.0–100.0)
Platelets: 198 10*3/uL (ref 150–400)
RBC: 4.24 MIL/uL (ref 4.22–5.81)
RDW: 14.6 % (ref 11.5–15.5)
WBC: 5.6 10*3/uL (ref 4.0–10.5)
nRBC: 0 % (ref 0.0–0.2)

## 2021-11-17 NOTE — Progress Notes (Signed)
STROKE TEAM PROGRESS NOTE   SUBJECTIVE (INTERVAL HISTORY) His wife is at the bedside.  Overall his condition is rapidly improving.  Patient sitting in chair, awake alert, still has left upper quadrantanopsia, but otherwise neuro intact.   OBJECTIVE Temp:  [98 F (36.7 C)-98.9 F (37.2 C)] 98.4 F (36.9 C) (06/28 1100) Pulse Rate:  [60-92] 69 (06/28 1200) Cardiac Rhythm: Normal sinus rhythm (06/28 1200) Resp:  [13-20] 14 (06/28 0900) BP: (113-163)/(50-102) 116/102 (06/28 1200) SpO2:  [91 %-97 %] 95 % (06/28 1200)  Recent Labs  Lab 11/15/21 1936 11/15/21 2110 11/16/21 0316  GLUCAP 75 78 93   Recent Labs  Lab 11/13/21 2053 11/14/21 0245 11/15/21 0359 11/15/21 1950 11/15/21 2020 11/15/21 2111 11/16/21 0253  NA  --  135 140 136 138 138 140  K  --  3.8 3.8 5.0 4.0 4.1 4.6  CL  --  105 107 107 102  --  106  CO2  --  $R'25 26 22 24  'nG$ --  23  GLUCOSE  --  128* 103* 85 94  --  91  BUN  --  $R'16 19 18 16  'SE$ --  16  CREATININE  --  1.50* 1.52* 1.50* 1.50*  --  1.62*  CALCIUM  --  9.0 8.6* 8.5* 8.7*  --  8.7*  MG 2.1  --   --   --   --   --   --    No results for input(s): "AST", "ALT", "ALKPHOS", "BILITOT", "PROT", "ALBUMIN" in the last 168 hours. Recent Labs  Lab 11/15/21 0359 11/15/21 1950 11/15/21 2020 11/15/21 2111 11/16/21 0253 11/17/21 0028  WBC 4.4 7.0 7.1  --  6.0 5.6  NEUTROABS  --   --  5.3  --   --   --   HGB 11.2* 12.5* 12.5* 11.2* 11.9* 11.7*  HCT 33.8* 40.6 40.0 33.0* 40.4 35.9*  MCV 84.5 90.0 86.4  --  91.0 84.7  PLT 173 173 218  --  182 198   No results for input(s): "CKTOTAL", "CKMB", "CKMBINDEX", "TROPONINI" in the last 168 hours. No results for input(s): "LABPROT", "INR" in the last 72 hours. No results for input(s): "COLORURINE", "LABSPEC", "PHURINE", "GLUCOSEU", "HGBUR", "BILIRUBINUR", "KETONESUR", "PROTEINUR", "UROBILINOGEN", "NITRITE", "LEUKOCYTESUR" in the last 72 hours.  Invalid input(s): "APPERANCEUR"     Component Value Date/Time   CHOL 124  11/14/2021 0245   CHOL 125 02/11/2019 0847   TRIG 164 (H) 11/14/2021 0245   HDL 32 (L) 11/14/2021 0245   HDL 45 02/11/2019 0847   CHOLHDL 3.9 11/14/2021 0245   VLDL 33 11/14/2021 0245   LDLCALC 59 11/14/2021 0245   LDLCALC 64 02/11/2019 0847   Lab Results  Component Value Date   HGBA1C 5.4 11/13/2021      Component Value Date/Time   LABOPIA NONE DETECTED 06/20/2018 0958   COCAINSCRNUR NONE DETECTED 06/20/2018 0958   LABBENZ NONE DETECTED 06/20/2018 0958   AMPHETMU NONE DETECTED 06/20/2018 0958   THCU NONE DETECTED 06/20/2018 0958   LABBARB NONE DETECTED 06/20/2018 0958    No results for input(s): "ETH" in the last 168 hours.  I have personally reviewed the radiological images below and agree with the radiology interpretations.  VAS US CAROTID  Result Date: 11/16/2021 Carotid Arterial Duplex Study Patient Name:  Jeremy [name]  Date of Exam:   11/16/2021 Medical Rec #: [id number]       Accession #:    [id number] Date of Birth: 19-Mar-1946  Patient Gender: M Patient Age:   38 years Exam Location:  [facility] Procedure:      VAS US CAROTID Referring Phys: Charlene Brooke --------------------------------------------------------------------------------  Indications:       Right endarterectomy. Risk Factors:      Hypertension, hyperlipidemia, past history of smoking,                    coronary artery disease. Comparison Study:  02/10/2021 Carotid artery duplex- 1-39% ICA stenosis                    bilaterally. Performing Technologist: Maudry Mayhew MHA, RDMS, RVT, RDCS  Examination Guidelines: A complete evaluation includes B-mode imaging, spectral Doppler, color Doppler, and power Doppler as needed of all accessible portions of each vessel. Bilateral testing is considered an integral part of a complete examination. Limited examinations for reoccurring indications may be performed as noted.  Right Carotid Findings:  +----------+--------+--------+--------+------------------+------------------+           PSV cm/sEDV cm/sStenosisPlaque DescriptionComments           +----------+--------+--------+--------+------------------+------------------+ CCA Prox  86      12                                intimal thickening +----------+--------+--------+--------+------------------+------------------+ CCA Distal71      12                                intimal thickening +----------+--------+--------+--------+------------------+------------------+ ICA Prox  41      11      1-39%                                        +----------+--------+--------+--------+------------------+------------------+ ICA Distal140     36                                                   +----------+--------+--------+--------+------------------+------------------+ ECA       106     15                                                   +----------+--------+--------+--------+------------------+------------------+ +----------+--------+-------+----------------+-------------------+           PSV cm/sEDV cmsDescribe        Arm Pressure (mmHG) +----------+--------+-------+----------------+-------------------+ [id number]            Multiphasic, WNL                    +----------+--------+-------+----------------+-------------------+ +---------+--------+--+--------+--+---------+ VertebralPSV cm/s83EDV cm/s20Antegrade +---------+--------+--+--------+--+---------+  Left Carotid Findings: +----------+--------+-------+--------+----------------------+------------------+           PSV cm/sEDV    StenosisPlaque Description    Comments                             cm/s                                                    +----------+--------+-------+--------+----------------------+------------------+  CCA Prox  88      22                                   intimal thickening  +----------+--------+-------+--------+----------------------+------------------+ CCA Distal63      16                                   intimal thickening +----------+--------+-------+--------+----------------------+------------------+ ICA Prox  117     36     1-39%   heterogenous and                                                          smooth                                   +----------+--------+-------+--------+----------------------+------------------+ ICA Mid   138     38                                                      +----------+--------+-------+--------+----------------------+------------------+ ICA Distal128     37                                                      +----------+--------+-------+--------+----------------------+------------------+ ECA       123     10                                                      +----------+--------+-------+--------+----------------------+------------------+ +----------+--------+--------+----------------+-------------------+           PSV cm/sEDV cm/sDescribe        Arm Pressure (mmHG) +----------+--------+--------+----------------+-------------------+ [id number]             Multiphasic, WNL                    +----------+--------+--------+----------------+-------------------+ +---------+--------+--+--------+--+ VertebralPSV cm/s70EDV cm/s12 +---------+--------+--+--------+--+   Summary: Right Carotid: Velocities in the right ICA are consistent with a 1-39% stenosis. Left Carotid: Velocities in the left ICA are consistent with a 1-39% stenosis. Vertebrals:  Bilateral vertebral arteries demonstrate antegrade flow. Subclavians: Normal flow hemodynamics were seen in bilateral subclavian              arteries. *See table(s) above for measurements and observations.  Electronically signed by Harold Barban MD on 11/16/2021 at 10:47:10 PM.    Final    MR BRAIN WO CONTRAST  Result Date:  11/16/2021 CLINICAL DATA:  Neuro deficit, acute, stroke suspected. EXAM: MRI HEAD WITHOUT CONTRAST TECHNIQUE: Multiplanar, multiecho pulse sequences of the brain and surrounding structures were obtained without intravenous contrast. COMPARISON:  11/15/2021 CT.  06/20/2018 MRI. FINDINGS: Brain: Small grouping of acute infarctions within  the inferior cerebellum on the right. Acute infarction affecting the posteromedial temporal lobe and occipital lobe on the right, PCA territory. Mild swelling but no blood products visible in that area. Punctate acute infarction along the cortical surface in the right frontoparietal junction region. Elsewhere, there chronic small-vessel ischemic changes of the pons. Old small vessel cerebellar infarctions are present. There is chronic infarction of the right posterolateral temporal lobe and there are mild chronic small-vessel ischemic changes of the cerebral hemispheric white matter. No hydrocephalus. No extra-axial collection. Vascular: Major vessels at the base of the brain show flow. Skull and upper cervical spine: Negative Sinuses/Orbits: Clear/normal Other: None IMPRESSION: Acute infarction in the right PCA territory. Mild swelling but no evidence of hemorrhage or mass effect. Small cluster of acute infarctions within the right cerebellar hemisphere. Single punctate acute infarction along the cortical surface of the right frontoparietal junction near the vertex. Old infarction of the right lateral temporal lobe. Old small vessel insults elsewhere within the cerebellum, brainstem and cerebral hemispheric white matter. Electronically Signed   By: Nelson Chimes M.D.   On: 11/16/2021 14:50   EEG adult  Result Date: 11/15/2021 Lora Havens, MD     11/15/2021  9:48 PM Patient Name: Jeremy [name] MRN: [id number] Epilepsy Attending: Lora Havens Referring Physician/Provider: Amie Portland, MD Date:11/15/2021 Duration: 23 mins Patient history: 76 year old man with above past  medical history, status post cardiac cath today after which she has been noted to have some confusion, dysarthria. EEG to evaluate for seizure. Level of alertness: Awake AEDs during EEG study: None Technical aspects: This EEG study was done with scalp electrodes positioned according to the 10-20 International system of electrode placement. Electrical activity was acquired at a sampling rate of $Remov'500Hz'HuEdFS$  and reviewed with a high frequency filter of $RemoveB'70Hz'soYdaMyU$  and a low frequency filter of $RemoveB'1Hz'vofMURRE$ . EEG data were recorded continuously and digitally stored. Description: No posterior dominant rhythm was seen. EEG showed continuous generalized 3 to 6 Hz theta-delta slowing.Hyperventilation and photic stimulation were not performed.   ABNORMALITY - Continuous slow, generalized IMPRESSION: This study is suggestive of moderate diffuse encephalopathy, nonspecific etiology. No seizures or epileptiform discharges were seen throughout the recording. Priyanka Barbra Sarks   CT ANGIO HEAD NECK W WO CM W PERF (CODE STROKE)  Result Date: 11/15/2021 CLINICAL DATA:  Initial evaluation for neuro deficit, stroke suspected. EXAM: CT ANGIOGRAPHY HEAD AND NECK CT PERFUSION BRAIN TECHNIQUE: Multidetector CT imaging of the head and neck was performed using the standard protocol during bolus administration of intravenous contrast. Multiplanar CT image reconstructions and MIPs were obtained to evaluate the vascular anatomy. Carotid stenosis measurements (when applicable) are obtained utilizing NASCET criteria, using the distal internal carotid diameter as the denominator. Multiphase CT imaging of the brain was performed following IV bolus contrast injection. Subsequent parametric perfusion maps were calculated using RAPID software. RADIATION DOSE REDUCTION: This exam was performed according to the departmental dose-optimization program which includes automated exposure control, adjustment of the mA and/or kV according to patient size and/or use of iterative  reconstruction technique. CONTRAST:  127mL OMNIPAQUE IOHEXOL 350 MG/ML SOLN COMPARISON:  Prior CT from earlier the same day. FINDINGS: CTA NECK FINDINGS Aortic arch: Examination technically limited by extensive motion artifact. Visualized aortic arch normal caliber with standard branching pattern. Moderate atheromatous change seen about the arch and origin of the great vessels. No obvious high-grade stenosis on this limited exam. Right carotid system: Right common and internal carotid arteries are tortuous but grossly patent without  visible stenosis or dissection. Left carotid system: Left common and internal carotid arteries are tortuous. Left CCA patent from its origin to the bifurcation without visible stenosis. Evaluation of the left carotid bulb severely limited by motion, with possible high-grade stenosis not excluded (series 8, image 191). Left ICA patent distally without stenosis or dissection. Vertebral arteries: Both vertebral arteries arise from the subclavian arteries. No visible proximal subclavian artery stenosis. Strongly dominant right vertebral artery with diffusely hypoplastic left vertebral artery. Moderate atheromatous change about the right V3 segment with associated mild to moderate stenosis. Visualized vertebral arteries otherwise patent within the neck. Skeleton: No discrete or worrisome osseous lesions. Moderate multilevel cervical spondylosis. Other neck: No other acute soft tissue abnormality within the neck. Small sebaceous cyst noted within the subcutaneous fat of the left upper back. Upper chest: Visualized upper chest demonstrates no other acute finding. Review of the MIP images confirms the above findings CTA HEAD FINDINGS Anterior circulation: Examination technically limited by extensive motion artifact. Petrous segments grossly patent bilaterally. Atheromatous change seen throughout the carotid siphons with mild-to-moderate stenoses. A1 segments patent. Grossly normal anterior  communicating complex. The visualized ACAs grossly patent to their distal aspects without appreciable stenosis. No M1 stenosis or occlusion. No visible proximal MCA branch occlusion. Distal MCA branches grossly perfused and symmetric. Posterior circulation: Dominant right V 4 segment patent without stenosis. Right PICA grossly patent at its origin. Hypoplastic left vertebral artery appears to largely terminate in PICA. Left PICA grossly patent as well. Basilar patent to its distal aspect without appreciable stenosis. Superior cerebellar arteries patent proximally. Both PCAs primarily supplied via the basilar. Multifocal atheromatous irregularity throughout the PCAs which remain patent to their distal aspects. Venous sinuses: Visualized major dural sinuses are grossly patent allowing for timing the contrast bolus. Anatomic variants: As above. No obvious aneurysm or other vascular malformation. Review of the MIP images confirms the above findings CT Brain Perfusion Findings: ASPECTS: 10. CBF (<30%) Volume: 46mL Perfusion (Tmax>6.0s) volume: 823mL Mismatch Volume: 862mL Infarction Location:Examination technically limited by motion artifact. No evidence for acute ischemia or core infarct. Apparent widespread delayed perfusion throughout both cerebral and cerebellar hemispheres felt to be consistent with artifact. IMPRESSION: 1. Technically limited exam due to extensive motion artifact. 2. Grossly negative CTA. No large vessel occlusion or other visible emergent finding. 3. Severely limited evaluation of the left carotid bulb, with possible high-grade stenosis not excluded. Correlation with dedicated carotid Doppler ultrasound suggested as clinically warranted. Alternatively, a repeat examination when the patient is able to tolerate the study could be performed as well. 4. No evidence for acute ischemia or core infarct. Apparent widespread delayed perfusion throughout both cerebral and cerebellar hemispheres felt to be  consistent with artifact. Electronically Signed   By: Jeannine Boga M.D.   On: 11/15/2021 21:36   CT HEAD CODE STROKE WO CONTRAST  Result Date: 11/15/2021 CLINICAL DATA:  Code stroke. EXAM: CT HEAD WITHOUT CONTRAST TECHNIQUE: Contiguous axial images were obtained from the base of the skull through the vertex without intravenous contrast. RADIATION DOSE REDUCTION: This exam was performed according to the departmental dose-optimization program which includes automated exposure control, adjustment of the mA and/or kV according to patient size and/or use of iterative reconstruction technique. COMPARISON:  Prior CT from 05/21/2020. FINDINGS: Brain: Examination technically limited by positioning and motion artifact. Age-related cerebral atrophy with chronic small vessel ischemic disease. Chronic encephalomalacia involving the right temporal occipital region noted, grossly stable. No visible acute large vessel territory infarct. No visible acute  intracranial hemorrhage. No mass lesion, mass effect or midline shift. No hydrocephalus or extra-axial fluid collection. Vascular: No convincing hyperdense vessel. Skull: Scalp soft tissues and calvarium demonstrate no acute finding. Sinuses/Orbits: Globes and orbital soft tissues grossly within normal limits. Paranasal sinuses are largely clear. No obvious mastoid effusion. Other: None. ASPECTS [facility] Stroke Program Early CT Score) - Ganglionic level infarction (caudate, lentiform nuclei, internal capsule, insula, M1-M3 cortex): 7 - Supraganglionic infarction (M4-M6 cortex): 3 Total score (0-10 with 10 being normal): 10 IMPRESSION: 1. Technically limited exam due to motion artifact. No definite acute intracranial abnormality. 2. ASPECTS is 10. 3. Atrophy with chronic small vessel ischemic disease, with chronic right cerebral infarct. These results were communicated to Dr. Rory Percy at 8:30 pm on 11/15/2021 by text page via the [facility] messaging system. Electronically Signed    By: Jeannine Boga M.D.   On: 11/15/2021 20:41   CARDIAC CATHETERIZATION  Result Date: 11/15/2021 Images from the original result were not included.   Prox Cx to Mid Cx lesion is 20% stenosed.   Dist Cx lesion is 70% stenosed.   2nd Diag lesion is 80% stenosed.   2nd Mrg lesion is 60% stenosed.   RPDA lesion is 50% stenosed.   3rd Mrg lesion is 90% stenosed.   Dist RCA lesion is 90% stenosed.   Prox RCA to Mid RCA lesion is 80% stenosed.   Non-stenotic Prox LAD lesion was previously treated.   A stent was successfully placed.   A drug-eluting stent was successfully placed using a SYNERGY XD 2.50X32.   Post intervention, there is a 0% residual stenosis.   Post intervention, there is a 0% residual stenosis. Jeremy [name] is a 76 y.o. male  [id number] LOCATION:  FACILITY: [place] PHYSICIAN: Quay Burow, M.D. 1946-01-10 DATE OF PROCEDURE:  11/15/2021 DATE OF DISCHARGE: CARDIAC CATHETERIZATION / PCI DES RCA History obtained from chart review.76 y.o. male with chest pressure concerning for unstable angina.  Thankfully troponins have been normal.  EKG unchanged left bundle branch block.  He states this is the same as he had felt prior to his previous heart attacks and stent placements. PROCEDURE DESCRIPTION: The patient was brought to the second floor [place] [facility] cath lab in the postabsorptive state. He was premedicated with IV Versed and fentanyl. His right wrist was prepped and shaved in usual sterile fashion. Xylocaine 1% was used for local anesthesia. A 6 French sheath was inserted into the right radial artery using standard Seldinger technique. The patient received 5000 units  of heparin intravenously.  A 5 [place] TIG catheter and right Judkins catheters were used for selective coronary and angiography and obtain left heart pressures.  Isovue dye is used for the entirety of the case (180 cc contrast total to patient).  Retrograde ordered, left ventricular and pullback pressures were recorded (LVEDP was  25).  Radial cocktail was administered via the SideArm sheath. The patient received an additional 300 mg of clopidogrel p.o. in addition to Pepcid 20 mg IV.  He was on aspirin and clopidogrel at home but said he may have missed an occasional dose.  He received an additional 5000's of heparin with an ACT of 323.  Isovue dye is used for the entirety of the intervention.  Retroaortic pressures monitored in the case. Using a 6 [place] AL-1 with sideholes along with 0.14 [place] guidewire I was able to cross the proximal mid and distal RCA disease segments with little difficulty.  I postdilated all disease seconds with a 2.5  mm x 12 mm long balloon.  I then placed a 3 mm x 12 mm long Synergy drug-eluting stent in the distal lesion.  I overlapped this because of "hazy appearance in the proximal edge of the stent" with a 3 oh 16 Synergy deployed at 14 to 16 atm.  I then placed a 2.5 x 32 mm long Synergy drug-eluting stent across the entire midportion of the RCA deployed at 14 to 16 atm.  I postdilated with a 3 mm x 15 mm long noncompliant balloon up to 14 to 16 atm resulting in reduction of a long 80% stenosis to 0% residual.  I did lose 2 small acute marginal branches. With deployment of the distal stents the patient became bradycardic and hypotensive requiring intravenous atropine (3 mg) as well as Levophed.  Ultimately his blood pressure and heart rate stabilized.  He was complaining of chest pain at the end of the case.  His postprocedure EKG was unchanged with left bundle branch block.  He was hemodynamically stable.   Mr. Morren proximal LAD and mid AV groove circumflex stents were widely patent.  He did have a 90% stenosis and a small third marginal branch.  The entire mid RCA had progressed and was diffusely diseased in the 80% range and he had a fairly focal 90% stenosis in the distal RCA as well as a diffusely diseased PDA.  I reviewed the angiograms with Dr. Burt Knack and we both agreed that the best option was  stenting of the distal RCA and the entire mid RCA.  This was performed placing 2 overlapping stents in the distal RCA and along DES stent in the mid RCA which was postdilated to 3 mm.  The patient still is complaining of pain.  Because of this I have elected to place him in unit to H for closer observation.  He did have TIMI-3 flow at the end of the case. Quay Burow. MD, [facility] 11/15/2021 11:05 AM    ECHOCARDIOGRAM COMPLETE  Result Date: 11/14/2021    ECHOCARDIOGRAM REPORT   Patient Name:   Jeremy [name] Date of Exam: 11/14/2021 Medical Rec #:  [id number]      Height:       67.0 in Accession #:    [id number]     Weight:       219.3 lb Date of Birth:  21-Oct-1945      BSA:          2.103 m Patient Age:    55 years       BP:           124/64 mmHg Patient Gender: M              HR:           74 bpm. Exam Location:  Inpatient Procedure: 2D Echo, Cardiac Doppler and Color Doppler Indications:    R07.9* Chest pain, unspecified  History:        Patient has prior history of Echocardiogram examinations, most                 recent 08/23/2020. Previous Myocardial Infarction and CAD, Carotid                 Disease and CKD, Signs/Symptoms:Shortness of Breath; Risk                 Factors:Hypertension, Dyslipidemia and Former Smoker. S/P PCT of  LAD and RCA.  Sonographer:    Wilkie Aye RVT Referring Phys: [place] Comments: Suboptimal parasternal window. IMPRESSIONS  1. Left ventricular ejection fraction, by estimation, is 60 to 65%. The left ventricle has normal function. The left ventricle has no regional wall motion abnormalities. There is mild left ventricular hypertrophy. Left ventricular diastolic parameters are consistent with Grade I diastolic dysfunction (impaired relaxation). The average left ventricular global longitudinal strain is -13.3 %. The global longitudinal strain is abnormal.  2. Right ventricular systolic function is normal. The right ventricular size is normal.  3.  The mitral valve is normal in structure. Trivial mitral valve regurgitation. No evidence of mitral stenosis.  4. The aortic valve is tricuspid. Aortic valve regurgitation is not visualized. No aortic stenosis is present.  5. The inferior vena cava is normal in size with greater than 50% respiratory variability, suggesting right atrial pressure of 3 mmHg. Comparison(s): No significant change from prior study. Prior images reviewed side by side. 08/23/20-EF 60-65%. FINDINGS  Left Ventricle: Left ventricular ejection fraction, by estimation, is 60 to 65%. The left ventricle has normal function. The left ventricle has no regional wall motion abnormalities. The average left ventricular global longitudinal strain is -13.3 %. The global longitudinal strain is abnormal. The left ventricular internal cavity size was normal in size. There is mild left ventricular hypertrophy. Left ventricular diastolic parameters are consistent with Grade I diastolic dysfunction (impaired relaxation). Right Ventricle: The right ventricular size is normal. No increase in right ventricular wall thickness. Right ventricular systolic function is normal. Left Atrium: Left atrial size was normal in size. Right Atrium: Right atrial size was normal in size. Pericardium: There is no evidence of pericardial effusion. Presence of epicardial fat layer. Mitral Valve: The mitral valve is normal in structure. Trivial mitral valve regurgitation. No evidence of mitral valve stenosis. Tricuspid Valve: The tricuspid valve is normal in structure. Tricuspid valve regurgitation is not demonstrated. No evidence of tricuspid stenosis. Aortic Valve: The aortic valve is tricuspid. Aortic valve regurgitation is not visualized. No aortic stenosis is present. Aortic valve mean gradient measures 4.0 mmHg. Aortic valve peak gradient measures 5.8 mmHg. Pulmonic Valve: The pulmonic valve was normal in structure. Pulmonic valve regurgitation is not visualized. No evidence of  pulmonic stenosis. Aorta: The aortic root is normal in size and structure. Venous: The inferior vena cava is normal in size with greater than 50% respiratory variability, suggesting right atrial pressure of 3 mmHg. IAS/Shunts: No atrial level shunt detected by color flow Doppler.  LEFT VENTRICLE PLAX 2D LVIDd:         4.10 cm     Diastology LVIDs:         2.30 cm     LV e' medial:    5.07 cm/s LV PW:         1.20 cm     LV E/e' medial:  15.7 LV IVS:        1.30 cm     LV e' lateral:   6.76 cm/s                            LV E/e' lateral: 11.8  LV Volumes (MOD)           2D Longitudinal Strain LV vol d, MOD A2C: 89.2 ml 2D Strain GLS Avg:     -13.3 % LV vol d, MOD A4C: 89.1 ml LV vol s, MOD A2C: 42.3  ml LV vol s, MOD A4C: 39.8 ml LV SV MOD A2C:     46.9 ml LV SV MOD A4C:     89.1 ml LV SV MOD BP:      49.3 ml RIGHT VENTRICLE             IVC RV Basal diam:  3.30 cm     IVC diam: 1.70 cm RV Mid diam:    2.30 cm RV S prime:     14.35 cm/s TAPSE (M-mode): 2.0 cm LEFT ATRIUM             Index        RIGHT ATRIUM           Index LA diam:        3.40 cm 1.62 cm/m   RA Area:     17.00 cm LA Vol (A2C):   61.5 ml 29.24 ml/m  RA Volume:   48.00 ml  22.82 ml/m LA Vol (A4C):   29.1 ml 13.84 ml/m LA Biplane Vol: 44.0 ml 20.92 ml/m  AORTIC VALVE                   PULMONIC VALVE AV Vmax:           120.00 cm/s PV Vmax:       1.14 m/s AV Vmean:          89.000 cm/s PV Peak grad:  5.2 mmHg AV VTI:            0.271 m AV Peak Grad:      5.8 mmHg AV Mean Grad:      4.0 mmHg LVOT Vmax:         102.00 cm/s LVOT Vmean:        77.500 cm/s LVOT VTI:          0.239 m LVOT/AV VTI ratio: 0.88  AORTA Ao Root diam: 2.75 cm Ao Asc diam:  3.40 cm MITRAL VALVE MV Area (PHT): 3.00 cm     SHUNTS MV Decel Time: 253 msec     Systemic VTI: 0.24 m MV E velocity: 79.70 cm/s MV A velocity: 100.00 cm/s MV E/A ratio:  0.80 Candee Furbish MD Electronically signed by Candee Furbish MD Signature Date/Time: 11/14/2021/10:14:35 AM    Final    DG Chest 2  View  Result Date: 11/13/2021 CLINICAL DATA:  Chest pain. EXAM: CHEST - 2 VIEW COMPARISON:  09/18/2020 FINDINGS: Normal sized heart. Clear lungs with normal vascularity. Mildly tortuous and partially calcified thoracic aorta. Thoracic spine and bilateral shoulder degenerative changes. IMPRESSION: No active cardiopulmonary disease. Electronically Signed   By: Claudie Revering M.D.   On: 11/13/2021 16:03     PHYSICAL EXAM  Temp:  [98 F (36.7 C)-98.9 F (37.2 C)] 98.4 F (36.9 C) (06/28 1100) Pulse Rate:  [60-92] 69 (06/28 1200) Resp:  [13-20] 14 (06/28 0900) BP: (113-163)/(50-102) 116/102 (06/28 1200) SpO2:  [91 %-97 %] 95 % (06/28 1200)  General - Well nourished, well developed, in no apparent distress.  Ophthalmologic - fundi not visualized due to noncooperation.  Cardiovascular - Regular rhythm and rate.  Mental Status -  Level of arousal and orientation to time, place, and person were intact. Language including expression, naming, repetition, comprehension was assessed and found intact.  Cranial Nerves II - XII - II - Visual field exam showed left upper quadrantanopsia III, IV, VI - Extraocular movements intact. V - Facial sensation intact bilaterally. VII - Facial movement intact bilaterally. VIII -  Hearing & vestibular intact bilaterally. X - Palate elevates symmetrically. XI - Chin turning & shoulder shrug intact bilaterally. XII - Tongue protrusion intact.  Motor Strength - The patient's strength was normal in all extremities and pronator drift was absent.  Bulk was normal and fasciculations were absent.   Motor Tone - Muscle tone was assessed at the neck and appendages and was normal.  Reflexes - The patient's reflexes were symmetrical in all extremities and he had no pathological reflexes.  Sensory - Light touch, temperature/pinprick were assessed and were symmetrical.    Coordination - The patient had normal movements in the hands with no ataxia or dysmetria.  Tremor  was absent.  Gait and Station - deferred.   ASSESSMENT/PLAN Jeremy [name] is a 76 y.o. male with history of right CEA 2019, CAD, hypertension, hyperlipidemia, history of seizure and stroke admitted for cardiac cath and stenting.  Post cardiac cath on 6/26 patient was found to have confusion, difficulty speaking, somnolence and mild left arm drift.  Neurology was consulted.    Stroke:  right PCA, cerebellar and punctate frontoparietal infarcts embolic likely related to cardiac cath procedure.  A-fib cannot be completed without but not reported from telemetry monitoring CT head no acute finding CT head and neck limited, nondiagnostic MRI right PCA, right cerebellum and punctate right frontal parietal infarcts Carotid Doppler unremarkable 2D Echo EF 60 to 65% Consider 30-day cardiac event monitor as outpatient to rule out A-fib EEG moderate diffuse encephalopathy LDL 59 HgbA1c 5.4 SCDs for VTE prophylaxis aspirin 81 mg daily and clopidogrel 75 mg daily prior to admission, now on aspirin 81 mg daily and clopidogrel 75 mg daily.  Continue per cardiology Patient counseled to be compliant with his antithrombotic medications Ongoing aggressive stroke risk factor management Therapy recommendations: Pending Disposition: Pending  CAD CHF Status post cardiac cath and stenting On DAPT 05/2018 EF 35 to 40% This admission EF 60 to 65% Continue home Imdur, Coreg, metoprolol Management per cardiology  History of stroke and seizure Old right parietal infarct on previous imaging History of right CEA in 2019 with Dr. Donnetta Hutching Carotid Doppler this admission unremarkable 05/2018 admitted for new onset seizure.  Treated with Keppra, Dilantin and Vimpat.  MRI showed new left occipital tiny infarct.  Discharged with DAPT for 3 weeks and then aspirin alone. 07/2018 follow-up with Dr. Leonie Man at [facility], considered side effects and intolerance of AEDs, which was then discontinued. EEG this admission moderate  diffuse encephalopathy Continue follow-up with Dr. Leonie Man at [facility]  Hypertension Stable On metoprolol and Coreg Long term BP goal normotensive  Hyperlipidemia Home meds: Crestor 40 LDL 59, goal < 70 Now continue Crestor 40 Continue statin at discharge  Other Stroke Risk Factors Advanced age Obesity, Body mass index is 34.35 kg/m.   Other Active Problems CKD 3, creatinine 1.62  Hospital day # 4  Neurology will sign off. Please call with questions. Pt will follow up with stroke clinic Dr. Leonie Man at [facility] in about 4 weeks. Thanks for the consult.   Rosalin Hawking, MD PhD Stroke Neurology 11/17/2021 12:35 PM    To contact Stroke Continuity provider, please refer to [email or web address] After hours, contact General Neurology

## 2021-11-17 NOTE — Progress Notes (Signed)
CARDIAC REHAB PHASE I   PRE:  Rate/Rhythm: 72 SR    BP: sitting 157/68    SaO2: 98 RA  MODE:  Ambulation: 470 ft   POST:  Rate/Rhythm: 93 SR    BP: sitting 178/91     SaO2: 98 RA  Tolerated well, no angina. Pt declines any residual effects of his CVA. BP elevated. Discussed with pt and wife MI, stents, Plavix, restrictions, diet, exercise, NTG, and CRPII. Pt receptive. Will refer to [place]   [place], ACSM 11/17/2021 3:04 PM

## 2021-11-17 NOTE — Progress Notes (Signed)
Progress Note  Patient Name: Jeremy [name] Date of Encounter: 11/17/2021  [facility] Cardiologist: Jenkins Rouge, MD   Subjective   Patient feeling much better today.  No chest pain or shortness of breath.  Inpatient Medications    Scheduled Meds:  sodium chloride   Intravenous Once   aspirin EC  81 mg Oral Daily   Chlorhexidine Gluconate Cloth  6 each Topical Daily   clopidogrel  75 mg Oral Q breakfast   cycloSPORINE  1 drop Both Eyes BID   divalproex  500 mg Oral Q12H   gabapentin  300 mg Oral TID   isosorbide mononitrate  30 mg Oral Nightly   losartan  25 mg Oral Nightly   metoprolol succinate  12.5 mg Oral Nightly   nystatin  1 Application Topical BID   pantoprazole  40 mg Oral Daily   rosuvastatin  40 mg Oral Daily   sodium chloride flush  3 mL Intravenous Q12H   sodium chloride flush  3 mL Intravenous Q12H   Continuous Infusions:  sodium chloride Stopped (11/15/21 1833)   sodium chloride     dexmedetomidine (PRECEDEX) IV infusion Stopped (11/16/21 0200)   PRN Meds: sodium chloride, acetaminophen, albuterol, haloperidol lactate, nitroGLYCERIN, ondansetron (ZOFRAN) IV, mouth rinse, mouth rinse, oxyCODONE, sodium chloride flush   Vital Signs    Vitals:   11/17/21 0500 11/17/21 0600 11/17/21 0700 11/17/21 0741  BP: (!) 136/50 (!) 125/58 120/89   Pulse: 67 71 70   Resp: 16 17    Temp:    98.2 F (36.8 C)  TempSrc:    Oral  SpO2: 97% 95% 94%   Weight:      Height:        Intake/Output Summary (Last 24 hours) at 11/17/2021 0759 Last data filed at 11/16/2021 2000 Gross per 24 hour  Intake 600 ml  Output 900 ml  Net -300 ml      11/14/2021    1:51 AM 11/13/2021    7:42 PM 11/13/2021    3:33 PM  Last 3 Weights  Weight (lbs) 219 lb 4.8 oz 219 lb 14.4 oz 216 lb 11.4 oz  Weight (kg) 99.474 kg 99.746 kg 98.3 kg      Telemetry    Normal sinus rhythm with no significant arrhythmia- Personally Reviewed   Physical Exam  Alert, oriented, elderly male  in no distress GEN: No acute distress.   Neck: No JVD Cardiac: RRR, no murmurs, rubs, or gallops.  Respiratory: Clear to auscultation bilaterally. GI: Soft, nontender, non-distended  MS: No edema; No deformity.  Right radial cath site clear Neuro:  Nonfocal  Psych: Normal affect   Labs    High Sensitivity Troponin:   Recent Labs  Lab 11/13/21 1503 11/13/21 1620 11/14/21 0725 11/15/21 2020  TROPONINIHS _0 6,227*     Chemistry Recent Labs  Lab 11/13/21 2053 11/14/21 0245 11/15/21 1950 11/15/21 2020 11/15/21 2111 11/16/21 0253  NA  --    < > 136 138 138 140  K  --    < > 5.0 4.0 4.1 4.6  CL  --    < > 107 102  --  106  CO2  --    < > 22 24  --  23  GLUCOSE  --    < > 85 94  --  91  BUN  --    < > 18 16  --  16  CREATININE  --    < > 1.50*  1.50*  --  1.62*  CALCIUM  --    < > 8.5* 8.7*  --  8.7*  MG 2.1  --   --   --   --   --   GFRNONAA  --    < > 48* 48*  --  44*  ANIONGAP  --    < > 7 12  --  11   < > = values in this interval not displayed.    Lipids  Recent Labs  Lab 11/14/21 0245  CHOL 124  TRIG 164*  HDL 32*  LDLCALC 59  CHOLHDL 3.9    Hematology Recent Labs  Lab 11/15/21 2020 11/15/21 2111 11/16/21 0253 11/17/21 0028  WBC 7.1  --  6.0 5.6  RBC 4.63  --  4.44 4.24  HGB 12.5* 11.2* 11.9* 11.7*  HCT 40.0 33.0* 40.4 35.9*  MCV 86.4  --  91.0 84.7  MCH 27.0  --  26.8 27.6  MCHC 31.3  --  29.5* 32.6  RDW 15.0  --  14.8 14.6  PLT 218  --  182 198   Thyroid  Recent Labs  Lab 11/15/21 2111  TSH 2.585    BNPNo results for input(s): "BNP", "PROBNP" in the last 168 hours.  DDimer  Recent Labs  Lab 11/13/21 1622  DDIMER 0.42     Radiology    VAS US CAROTID  Result Date: 11/16/2021 Carotid Arterial Duplex Study Patient Name:  Jeremy [name]  Date of Exam:   11/16/2021 Medical Rec #: [id number]       Accession #:    [id number] Date of Birth: 1946/01/28       Patient Gender: M Patient Age:   26 years Exam Location:  [facility]  Procedure:      VAS US CAROTID Referring Phys: Charlene Brooke --------------------------------------------------------------------------------  Indications:       Right endarterectomy. Risk Factors:      Hypertension, hyperlipidemia, past history of smoking,                    coronary artery disease. Comparison Study:  02/10/2021 Carotid artery duplex- 1-39% ICA stenosis                    bilaterally. Performing Technologist: Maudry Mayhew MHA, RDMS, RVT, RDCS  Examination Guidelines: A complete evaluation includes B-mode imaging, spectral Doppler, color Doppler, and power Doppler as needed of all accessible portions of each vessel. Bilateral testing is considered an integral part of a complete examination. Limited examinations for reoccurring indications may be performed as noted.  Right Carotid Findings: +----------+--------+--------+--------+------------------+------------------+           PSV cm/sEDV cm/sStenosisPlaque DescriptionComments           +----------+--------+--------+--------+------------------+------------------+ CCA Prox  86      12                                intimal thickening +----------+--------+--------+--------+------------------+------------------+ CCA Distal71      12                                intimal thickening +----------+--------+--------+--------+------------------+------------------+ ICA Prox  41      11      1-39%                                        +----------+--------+--------+--------+------------------+------------------+  ICA Distal140     36                                                   +----------+--------+--------+--------+------------------+------------------+ ECA       106     15                                                   +----------+--------+--------+--------+------------------+------------------+ +----------+--------+-------+----------------+-------------------+           PSV cm/sEDV cmsDescribe         Arm Pressure (mmHG) +----------+--------+-------+----------------+-------------------+ [id number]            Multiphasic, WNL                    +----------+--------+-------+----------------+-------------------+ +---------+--------+--+--------+--+---------+ VertebralPSV cm/s83EDV cm/s20Antegrade +---------+--------+--+--------+--+---------+  Left Carotid Findings: +----------+--------+-------+--------+----------------------+------------------+           PSV cm/sEDV    StenosisPlaque Description    Comments                             cm/s                                                    +----------+--------+-------+--------+----------------------+------------------+ CCA Prox  88      22                                   intimal thickening +----------+--------+-------+--------+----------------------+------------------+ CCA Distal63      16                                   intimal thickening +----------+--------+-------+--------+----------------------+------------------+ ICA Prox  117     36     1-39%   heterogenous and                                                          smooth                                   +----------+--------+-------+--------+----------------------+------------------+ ICA Mid   138     38                                                      +----------+--------+-------+--------+----------------------+------------------+ ICA Distal128     37                                                      +----------+--------+-------+--------+----------------------+------------------+  ECA       123     10                                                      +----------+--------+-------+--------+----------------------+------------------+ +----------+--------+--------+----------------+-------------------+           PSV cm/sEDV cm/sDescribe        Arm Pressure (mmHG)  +----------+--------+--------+----------------+-------------------+ [id number]             Multiphasic, WNL                    +----------+--------+--------+----------------+-------------------+ +---------+--------+--+--------+--+ VertebralPSV cm/s70EDV cm/s12 +---------+--------+--+--------+--+   Summary: Right Carotid: Velocities in the right ICA are consistent with a 1-39% stenosis. Left Carotid: Velocities in the left ICA are consistent with a 1-39% stenosis. Vertebrals:  Bilateral vertebral arteries demonstrate antegrade flow. Subclavians: Normal flow hemodynamics were seen in bilateral subclavian              arteries. *See table(s) above for measurements and observations.  Electronically signed by Jeremy Barban MD on 11/16/2021 at 10:47:10 PM.    Final    MR BRAIN WO CONTRAST  Result Date: 11/16/2021 CLINICAL DATA:  Neuro deficit, acute, stroke suspected. EXAM: MRI HEAD WITHOUT CONTRAST TECHNIQUE: Multiplanar, multiecho pulse sequences of the brain and surrounding structures were obtained without intravenous contrast. COMPARISON:  11/15/2021 CT.  06/20/2018 MRI. FINDINGS: Brain: Small grouping of acute infarctions within the inferior cerebellum on the right. Acute infarction affecting the posteromedial temporal lobe and occipital lobe on the right, PCA territory. Mild swelling but no blood products visible in that area. Punctate acute infarction along the cortical surface in the right frontoparietal junction region. Elsewhere, there chronic small-vessel ischemic changes of the pons. Old small vessel cerebellar infarctions are present. There is chronic infarction of the right posterolateral temporal lobe and there are mild chronic small-vessel ischemic changes of the cerebral hemispheric white matter. No hydrocephalus. No extra-axial collection. Vascular: Major vessels at the base of the brain show flow. Skull and upper cervical spine: Negative Sinuses/Orbits: Clear/normal Other: None  IMPRESSION: Acute infarction in the right PCA territory. Mild swelling but no evidence of hemorrhage or mass effect. Small cluster of acute infarctions within the right cerebellar hemisphere. Single punctate acute infarction along the cortical surface of the right frontoparietal junction near the vertex. Old infarction of the right lateral temporal lobe. Old small vessel insults elsewhere within the cerebellum, brainstem and cerebral hemispheric white matter. Electronically Signed   By: Jeremy [name] M.D.   On: 11/16/2021 14:50   EEG adult  Result Date: 11/15/2021 Jeremy Havens, MD     11/15/2021  9:48 PM Patient Name: Jeremy [name] MRN: [id number] Epilepsy Attending: Lora [name] Referring Physician/Provider: Amie Portland, MD Date:11/15/2021 Duration: 23 mins Patient history: 76 year old man with above past medical history, status post cardiac cath today after which she has been noted to have some confusion, dysarthria. EEG to evaluate for seizure. Level of alertness: Awake AEDs during EEG study: None Technical aspects: This EEG study was done with scalp electrodes positioned according to the 10-20 International system of electrode placement. Electrical activity was acquired at a sampling rate of _0  and reviewed with a high frequency filter of _1  and a low frequency filter of _2 . EEG data were recorded continuously and digitally stored. Description: No posterior dominant  rhythm was seen. EEG showed continuous generalized 3 to 6 Hz theta-delta slowing.Hyperventilation and photic stimulation were not performed.   ABNORMALITY - Continuous slow, generalized IMPRESSION: This study is suggestive of moderate diffuse encephalopathy, nonspecific etiology. No seizures or epileptiform discharges were seen throughout the recording. Jeremy [name]   CT ANGIO HEAD NECK W WO CM W PERF (CODE STROKE)  Result Date: 11/15/2021 CLINICAL DATA:  Initial evaluation for neuro deficit, stroke suspected. EXAM: CT  ANGIOGRAPHY HEAD AND NECK CT PERFUSION BRAIN TECHNIQUE: Multidetector CT imaging of the head and neck was performed using the standard protocol during bolus administration of intravenous contrast. Multiplanar CT image reconstructions and MIPs were obtained to evaluate the vascular anatomy. Carotid stenosis measurements (when applicable) are obtained utilizing NASCET criteria, using the distal internal carotid diameter as the denominator. Multiphase CT imaging of the brain was performed following IV bolus contrast injection. Subsequent parametric perfusion maps were calculated using RAPID software. RADIATION DOSE REDUCTION: This exam was performed according to the departmental dose-optimization program which includes automated exposure control, adjustment of the mA and/or kV according to patient size and/or use of iterative reconstruction technique. CONTRAST:  148m OMNIPAQUE IOHEXOL 350 MG/ML SOLN COMPARISON:  Prior CT from earlier the same day. FINDINGS: CTA NECK FINDINGS Aortic arch: Examination technically limited by extensive motion artifact. Visualized aortic arch normal caliber with standard branching pattern. Moderate atheromatous change seen about the arch and origin of the great vessels. No obvious high-grade stenosis on this limited exam. Right carotid system: Right common and internal carotid arteries are tortuous but grossly patent without visible stenosis or dissection. Left carotid system: Left common and internal carotid arteries are tortuous. Left CCA patent from its origin to the bifurcation without visible stenosis. Evaluation of the left carotid bulb severely limited by motion, with possible high-grade stenosis not excluded (series 8, image 191). Left ICA patent distally without stenosis or dissection. Vertebral arteries: Both vertebral arteries arise from the subclavian arteries. No visible proximal subclavian artery stenosis. Strongly dominant right vertebral artery with diffusely hypoplastic  left vertebral artery. Moderate atheromatous change about the right V3 segment with associated mild to moderate stenosis. Visualized vertebral arteries otherwise patent within the neck. Skeleton: No discrete or worrisome osseous lesions. Moderate multilevel cervical spondylosis. Other neck: No other acute soft tissue abnormality within the neck. Small sebaceous cyst noted within the subcutaneous fat of the left upper back. Upper chest: Visualized upper chest demonstrates no other acute finding. Review of the MIP images confirms the above findings CTA HEAD FINDINGS Anterior circulation: Examination technically limited by extensive motion artifact. Petrous segments grossly patent bilaterally. Atheromatous change seen throughout the carotid siphons with mild-to-moderate stenoses. A1 segments patent. Grossly normal anterior communicating complex. The visualized ACAs grossly patent to their distal aspects without appreciable stenosis. No M1 stenosis or occlusion. No visible proximal MCA branch occlusion. Distal MCA branches grossly perfused and symmetric. Posterior circulation: Dominant right V 4 segment patent without stenosis. Right PICA grossly patent at its origin. Hypoplastic left vertebral artery appears to largely terminate in PICA. Left PICA grossly patent as well. Basilar patent to its distal aspect without appreciable stenosis. Superior cerebellar arteries patent proximally. Both PCAs primarily supplied via the basilar. Multifocal atheromatous irregularity throughout the PCAs which remain patent to their distal aspects. Venous sinuses: Visualized major dural sinuses are grossly patent allowing for timing the contrast bolus. Anatomic variants: As above. No obvious aneurysm or other vascular malformation. Review of the MIP images confirms the above findings CT Brain Perfusion  Findings: ASPECTS: 10. CBF (<30%) Volume: 72m Perfusion (Tmax>6.0s) volume: 8629mMismatch Volume: 86115mnfarction Location:Examination  technically limited by motion artifact. No evidence for acute ischemia or core infarct. Apparent widespread delayed perfusion throughout both cerebral and cerebellar hemispheres felt to be consistent with artifact. IMPRESSION: 1. Technically limited exam due to extensive motion artifact. 2. Grossly negative CTA. No large vessel occlusion or other visible emergent finding. 3. Severely limited evaluation of the left carotid bulb, with possible high-grade stenosis not excluded. Correlation with dedicated carotid Doppler ultrasound suggested as clinically warranted. Alternatively, a repeat examination when the patient is able to tolerate the study could be performed as well. 4. No evidence for acute ischemia or core infarct. Apparent widespread delayed perfusion throughout both cerebral and cerebellar hemispheres felt to be consistent with artifact. Electronically Signed   By: Jeremy BogaD.   On: 11/15/2021 21:36   CT HEAD CODE STROKE WO CONTRAST  Result Date: 11/15/2021 CLINICAL DATA:  Code stroke. EXAM: CT HEAD WITHOUT CONTRAST TECHNIQUE: Contiguous axial images were obtained from the base of the skull through the vertex without intravenous contrast. RADIATION DOSE REDUCTION: This exam was performed according to the departmental dose-optimization program which includes automated exposure control, adjustment of the mA and/or kV according to patient size and/or use of iterative reconstruction technique. COMPARISON:  Prior CT from 05/21/2020. FINDINGS: Brain: Examination technically limited by positioning and motion artifact. Age-related cerebral atrophy with chronic small vessel ischemic disease. Chronic encephalomalacia involving the right temporal occipital region noted, grossly stable. No visible acute large vessel territory infarct. No visible acute intracranial hemorrhage. No mass lesion, mass effect or midline shift. No hydrocephalus or extra-axial fluid collection. Vascular: No convincing  hyperdense vessel. Skull: Scalp soft tissues and calvarium demonstrate no acute finding. Sinuses/Orbits: Globes and orbital soft tissues grossly within normal limits. Paranasal sinuses are largely clear. No obvious mastoid effusion. Other: None. ASPECTS ([facility] Program Early CT Score) - Ganglionic level infarction (caudate, lentiform nuclei, internal capsule, insula, M1-M3 cortex): 7 - Supraganglionic infarction (M4-M6 cortex): 3 Total score (0-10 with 10 being normal): 10 IMPRESSION: 1. Technically limited exam due to motion artifact. No definite acute intracranial abnormality. 2. ASPECTS is 10. 3. Atrophy with chronic small vessel ischemic disease, with chronic right cerebral infarct. These results were communicated to Dr. AroRory [name] 8:30 pm on 11/15/2021 by text page via the [facility] system. Electronically Signed   By: Jeremy BogaD.   On: 11/15/2021 20:41   CARDIAC CATHETERIZATION  Result Date: 11/15/2021 Images from the original result were not included.   Prox Cx to Mid Cx lesion is 20% stenosed.   Dist Cx lesion is 70% stenosed.   2nd Diag lesion is 80% stenosed.   2nd Mrg lesion is 60% stenosed.   RPDA lesion is 50% stenosed.   3rd Mrg lesion is 90% stenosed.   Dist RCA lesion is 90% stenosed.   Prox RCA to Mid RCA lesion is 80% stenosed.   Non-stenotic Prox LAD lesion was previously treated.   A stent was successfully placed.   A drug-eluting stent was successfully placed using a SYNERGY XD 2.50X32.   Post intervention, there is a 0% residual stenosis.   Post intervention, there is a 0% residual stenosis. Jeremy [name] a [age]o. male  [id number]:  [facility]: Jeremy Burow.D. 04/01/22/1947TE OF PROCEDURE:  11/15/2021 DATE OF DISCHARGE: CARDIAC CATHETERIZATION / PCI DES RCA History obtained from chart review.[age]. male with chest pressure  concerning for unstable angina.  Thankfully troponins have been normal.  EKG unchanged left bundle branch block.   He states this is the same as he had felt prior to his previous heart attacks and stent placements. PROCEDURE DESCRIPTION: The patient was brought to the second floor [facility] cath lab in the postabsorptive state. He was premedicated with IV Versed and fentanyl. His right wrist was prepped and shaved in usual sterile fashion. Xylocaine 1% was used for local anesthesia. A 6 French sheath was inserted into the right radial artery using standard Seldinger technique. The patient received 5000 units  of heparin intravenously.  A 5 [place] TIG catheter and right Judkins catheters were used for selective coronary and angiography and obtain left heart pressures.  Isovue dye is used for the entirety of the case (180 cc contrast total to patient).  Retrograde ordered, left ventricular and pullback pressures were recorded (LVEDP was 25).  Radial cocktail was administered via the SideArm sheath. The patient received an additional 300 mg of clopidogrel p.o. in addition to Pepcid 20 mg IV.  He was on aspirin and clopidogrel at home but said he may have missed an occasional dose.  He received an additional 5000's of heparin with an ACT of 323.  Isovue dye is used for the entirety of the intervention.  Retroaortic pressures monitored in the case. Using a 6 [place] AL-1 with sideholes along with 0.14 [place] guidewire I was able to cross the proximal mid and distal RCA disease segments with little difficulty.  I postdilated all disease seconds with a 2.5 mm x 12 mm long balloon.  I then placed a 3 mm x 12 mm long Synergy drug-eluting stent in the distal lesion.  I overlapped this because of "hazy appearance in the proximal edge of the stent" with a 3 oh 16 Synergy deployed at 14 to 16 atm.  I then placed a 2.5 x 32 mm long Synergy drug-eluting stent across the entire midportion of the RCA deployed at 14 to 16 atm.  I postdilated with a 3 mm x 15 mm long noncompliant balloon up to 14 to 16 atm resulting in reduction of a  long 80% stenosis to 0% residual.  I did lose 2 small acute marginal branches. With deployment of the distal stents the patient became bradycardic and hypotensive requiring intravenous atropine (3 mg) as well as Levophed.  Ultimately his blood pressure and heart rate stabilized.  He was complaining of chest pain at the end of the case.  His postprocedure EKG was unchanged with left bundle branch block.  He was hemodynamically stable.   Mr. Matich proximal LAD and mid AV groove circumflex stents were widely patent.  He did have a 90% stenosis and a small third marginal branch.  The entire mid RCA had progressed and was diffusely diseased in the 80% range and he had a fairly focal 90% stenosis in the distal RCA as well as a diffusely diseased PDA.  I reviewed the angiograms with Dr. Burt [name] and we both agreed that the best option was stenting of the distal RCA and the entire mid RCA.  This was performed placing 2 overlapping stents in the distal RCA and along DES stent in the mid RCA which was postdilated to 3 mm.  The patient still is complaining of pain.  Because of this I have elected to place him in unit to H for closer observation.  He did have TIMI-3 flow at the end of the case. Jeremy [name]  Jeremy Found MD, [facility] 11/15/2021 11:05 AM     Cardiac Studies   As above  Patient Profile     76 y.o. male with CAD, history of PCI, hypertension, mixed hyperlipidemia, peripheral vascular disease, and history of stroke, presents with unstable angina and undergoes PCI of the RCA for treatment of severe stenoses in the mid and distal vessel  Assessment & Plan    1.  Unstable angina: Patient's status post PCI 11/15/2021.  We will continue aspirin and clopidogrel, rosuvastatin, and carvedilol.  Will try to mobilize today, transfer to telemetry bed, tentatively anticipate hospital discharge tomorrow as long as he remains clinically stable. 2.  Altered mental status/stroke: Patient with agitation following PCI.  Appreciate  neurology evaluation.  MRI shows right PCA territory acute infarction and small cluster of acute infarctions in the right cerebellum.  Appears markedly improved today.  He is alert, oriented, and appropriately interacting. 3.  Hypertension: BP well controlled on current therapy 4.  Mixed hyperlipidemia: Treated with a high intensity statin drug  Disposition: Mobilize, transfer to telemetry, cardiac rehab, appreciate neurology care and follow-up.  For questions or updates, please contact [facility] Please consult [email or web address] for contact info under        Signed, Sherren Mocha, MD  11/17/2021, 7:59 AM

## 2021-11-18 ENCOUNTER — Other Ambulatory Visit: Payer: Self-pay | Admitting: Cardiovascular Disease

## 2021-11-18 ENCOUNTER — Telehealth: Payer: Self-pay | Admitting: Physician Assistant

## 2021-11-18 DIAGNOSIS — I440 Atrioventricular block, first degree: Secondary | ICD-10-CM

## 2021-11-18 DIAGNOSIS — I639 Cerebral infarction, unspecified: Secondary | ICD-10-CM

## 2021-11-18 DIAGNOSIS — I447 Left bundle-branch block, unspecified: Secondary | ICD-10-CM

## 2021-11-18 DIAGNOSIS — I214 Non-ST elevation (NSTEMI) myocardial infarction: Secondary | ICD-10-CM

## 2021-11-18 LAB — COMPREHENSIVE METABOLIC PANEL
ALT: 18 U/L (ref 0–44)
AST: 26 U/L (ref 15–41)
Albumin: 3.2 g/dL — ABNORMAL LOW (ref 3.5–5.0)
Alkaline Phosphatase: 47 U/L (ref 38–126)
Anion gap: 9 (ref 5–15)
BUN: 19 mg/dL (ref 8–23)
CO2: 22 mmol/L (ref 22–32)
Calcium: 8.8 mg/dL — ABNORMAL LOW (ref 8.9–10.3)
Chloride: 107 mmol/L (ref 98–111)
Creatinine, Ser: 1.42 mg/dL — ABNORMAL HIGH (ref 0.61–1.24)
GFR, Estimated: 52 mL/min — ABNORMAL LOW (ref >60)
Glucose, Bld: 137 mg/dL — ABNORMAL HIGH (ref 70–99)
Potassium: 3.8 mmol/L (ref 3.5–5.1)
Sodium: 138 mmol/L (ref 135–145)
Total Bilirubin: 0.8 mg/dL (ref 0.3–1.2)
Total Protein: 5.8 g/dL — ABNORMAL LOW (ref 6.5–8.1)

## 2021-11-18 MED ORDER — PANTOPRAZOLE SODIUM 40 MG PO TBEC: 40.0000 mg | DELAYED_RELEASE_TABLET | Freq: Every day | ORAL | 1 refills | Status: DC

## 2021-11-18 MED ORDER — CARVEDILOL 12.5 MG PO TABS
12.5000 mg | ORAL_TABLET | Freq: Two times a day (BID) | ORAL | Status: DC
  Administered 2021-11-18: 12.5 mg via ORAL
  Filled 2021-11-18: qty 1

## 2021-11-18 MED ORDER — DIVALPROEX SODIUM 500 MG PO DR TAB: 500.0000 mg | DELAYED_RELEASE_TABLET | Freq: Two times a day (BID) | ORAL | 1 refills | Status: DC

## 2021-11-18 NOTE — Progress Notes (Signed)
Discharge instructions reviewed with pt and wife,. Copy of instructions given to pt. Pt informed of scripts sent to his pharmacy.  Stroke education reviewed with pt and wife, hand out provided.  Discussed specific education about medication changes as noted on pt d/c instructions--both pt and wife verbalized understanding.  Pt d/c'd via wheelchair with belongings, with wife.          Escorted by  volunteer services.

## 2021-11-18 NOTE — Care Management Important Message (Signed)
Important Message  Patient Details  Name: Jeremy [name] MRN: [id number] Date of Birth: 08/23/1945   Medicare Important Message Given:  Yes     Shelda Altes 11/18/2021, 8:33 AM

## 2021-11-18 NOTE — Progress Notes (Signed)
Cardiology Office Note:    Date:  11/26/2021   ID:  Jeremy [name], DOB [birth year], MRN [id number]  PCP:  Celene Squibb, MD   [facility] Providers Cardiologist:  Jenkins Rouge, MD     Referring MD: Celene Squibb, MD   Chief Complaint: hospital follow-up CAD s/p stenting RCA  History of Present Illness:    Jeremy [name] is a very pleasant 76 y.o. male with a hx of CAD (remote stent to RCA), recovered cardiomyopathy (EF 35-40% in 05/2018 at time of stroke then normal since then), hypertension, hyperlipidemia, PVD with history of CVA and right CEA by Dr. Donnetta Hutching, first-degree AV block, LBBB, CKD Stage 3a-b.   History of remote stent to RCA.  In November 2020 he underwent L HC with PTCA to circumflex and DES to proximal LAD with residual disease treated medically.  He presented to [facility] 11/13/2021 with chest pain concerning for unstable angina. On day of arrival he reported intermittent chest pain the preceding 2 to 3 days, worse with activity and improved with rest. Felt similar to prior angina. EKG showed NSR with known LBBB/first degree AVB. Labs showed negative troponin x3 and he was admitted to cardiology service for further management.  He underwent cardiac cath 6/26 with findings detailed below, patent LAD and circumflex stents, received 2 overlapping stents to RCA and the distal RCA and along DES in mid RCA, complicated by loss of 2 small acute marginal branches, residual disease treated medically.  With deployment of distal stents, patient became bradycardic and hypotensive requiring IV atropine as well as Levophed -ultimately his BP and HR stabilized. Was complaining of chest pain at the end of case and EKG was felt unchanged. He was observed in the ICU. F/u troponin 6/26 was 6227 and felt representative of periprocedural MI. Later in the evening, he developed AMS, aphasia, confusion, and combativeness prompting urgent neurology eval . Per neuro, right PCA, cerebellar and punctate frontoparietal  infarcts were felt embolic likely related to cardiac cath procedure (A-fib could not be completely ruled out but not reported from telemetry).  Per Dr. Burt Knack, no event monitor warranted at this time.  Carotid duplex revealed 1 to 39% BICA during admission, EF normal.  Patient felt stable for discharge however he was awaiting a repeat valproic acid level and ammonia level.  Per neurology, these could be obtained at 1 week post hospital cardiology follow-up.  Mental status back to baseline at discharge.  Advised not to drive until cleared by neurology.  Today, he is here with his wife.  Reports he has been feeling well since hospital discharge.  He has been walking around in the yard with no symptoms of chest pain, dyspnea, palpitations.  Has not been monitoring home BP. He denies bleeding concerns.  No concerns with current medications. Patient and wife report confusion about the terminology of seizure that has been used. Wife reports that event that occurred while he was at a [employer] in 2020 was reported as a seizure initially by a bystander but it was found to be a stroke. Following cardiac cath on 6/26, wife was told he was overmedicated during the procedure. Narcan was given and he became combative. He was itching, felt to be caused by oxycodone and was given benadryl.  Wife noted his face turning bright red while sleeping. He was difficult to awaken and neurology was consulted and confirmed CVA had occurred.   Past Medical History:  Diagnosis Date   Arthritis    Basal  cell carcinoma 02/11/2021   nod- left dorsal hand (EXC)   CAD (coronary artery disease)    a. s/p prior stenting of LAD and RCA b. 03/2019: NSTEMI and required PTCA/DESx1 to the LAD and successful scoring balloon angioplasty in the previously stented segment of LCx   Carotid stenosis, asymptomatic, right    Carpal tunnel syndrome    bilateral   Coronary artery disease    GERD (gastroesophageal reflux disease)    History of  kidney stones    Hx of colonic polyp    Hypercholesterolemia    Hypertension    Myocardial infarction ([place])    hx of   Obesity    SCC (squamous cell carcinoma) 02/11/2021   in situ- right temple (CX35FU)   Squamous cell carcinoma of skin 08/09/2016   well differentiated on right outer eye - tx p bx   Stroke [facility])     Past Surgical History:  Procedure Laterality Date   CARDIAC CATHETERIZATION     CATARACT EXTRACTION W/PHACO Left 03/12/2021   Procedure: CATARACT EXTRACTION PHACO AND INTRAOCULAR LENS PLACEMENT ([place]);  Surgeon: Baruch Goldmann, MD;  Location: [facility];  Service: Ophthalmology;  Laterality: Left;   CDE: 14.58   CATARACT EXTRACTION W/PHACO Right 04/08/2021   Procedure: CATARACT EXTRACTION PHACO AND INTRAOCULAR LENS PLACEMENT RIGHT EYE;  Surgeon: Baruch Goldmann, MD;  Location: [facility];  Service: Ophthalmology;  Laterality: Right;  CDE 10.94   CORONARY BALLOON ANGIOPLASTY N/A 03/26/2019   Procedure: CORONARY BALLOON ANGIOPLASTY;  Surgeon: Burnell Blanks, MD;  Location: [place] [facility] LAB;  Service: Cardiovascular;  Laterality: N/A;   CORONARY STENT INTERVENTION N/A 03/26/2019   Procedure: CORONARY STENT INTERVENTION;  Surgeon: Burnell Blanks, MD;  Location: [place] [facility];  Service: Cardiovascular;  Laterality: N/A;   CORONARY STENT INTERVENTION N/A 11/15/2021   Procedure: CORONARY STENT INTERVENTION;  Surgeon: Lorretta Harp, MD;  Location: [place] [facility];  Service: Cardiovascular;  Laterality: N/A;   CORONARY STENT PLACEMENT  2000   x3    CYST EXCISION     x 2   ENDARTERECTOMY Right 12/25/2017   Procedure: ENDARTERECTOMY CAROTID RIGHT;  Surgeon: Rosetta Posner, MD;  Location: [place];  Service: Vascular;  Laterality: Right;   LEFT HEART CATH AND CORONARY ANGIOGRAPHY N/A 03/26/2019   Procedure: LEFT HEART CATH AND CORONARY ANGIOGRAPHY;  Surgeon: Burnell Blanks, MD;  Location: [place] [facility];  Service: Cardiovascular;  Laterality: N/A;    LEFT HEART CATH AND CORONARY ANGIOGRAPHY N/A 11/15/2021   Procedure: LEFT HEART CATH AND CORONARY ANGIOGRAPHY;  Surgeon: Lorretta Harp, MD;  Location: [place] [facility] LAB;  Service: Cardiovascular;  Laterality: N/A;   PATCH ANGIOPLASTY Right 12/25/2017   Procedure: PATCH ANGIOPLASTY RIGHT CAROTID ARTERY;  Surgeon: Rosetta Posner, MD;  Location: [facility];  Service: Vascular;  Laterality: Right;   TEAR DUCT PROBING Bilateral 07/2020   done at [place]. Dr. Norlene Duel   TEE WITHOUT CARDIOVERSION N/A 06/29/2018   Procedure: TRANSESOPHAGEAL ECHOCARDIOGRAM (TEE);  Surgeon: Fay Records, MD;  Location: [facility] ENDOSCOPY;  Service: Cardiovascular;  Laterality: N/A;    Current Medications: Current Meds  Medication Sig   acetaminophen (TYLENOL) 500 MG tablet Take 500 mg by mouth every 6 (six) hours as needed for mild pain.   albuterol (VENTOLIN HFA) 108 (90 Base) MCG/ACT inhaler Inhale 1-2 puffs into the lungs every 6 (six) hours as needed for shortness of breath or wheezing.   aspirin EC 81 MG EC tablet  Take 1 tablet (81 mg total) by mouth daily.   carvedilol (COREG) 12.5 MG tablet Take 1 tablet (12.5 mg total) by mouth 2 (two) times daily with a meal.   clopidogrel (PLAVIX) 75 MG tablet Take 1 tablet by mouth once daily   divalproex (DEPAKOTE) 500 MG DR tablet Take 1 tablet (500 mg total) by mouth every 12 (twelve) hours.   gabapentin (NEURONTIN) 300 MG capsule Take 1 capsule (300 mg total) by mouth 3 (three) times daily.   isosorbide mononitrate (IMDUR) 30 MG 24 hr tablet Take 1 tablet (30 mg total) by mouth daily.   nitroGLYCERIN (NITROSTAT) 0.4 MG SL tablet Place 1 tablet (0.4 mg total) under the tongue every 5 (five) minutes as needed for chest pain.   nystatin (MYCOSTATIN/NYSTOP) powder Apply 1 Application topically in the morning and at bedtime.   nystatin cream (MYCOSTATIN) Apply 1 Application topically 2 (two) times daily.   oxyCODONE (ROXICODONE) 15 MG immediate release tablet Take 15 mg by mouth  every 6 (six) hours as needed for pain.   pantoprazole (PROTONIX) 40 MG tablet Take 1 tablet (40 mg total) by mouth daily.   Propylene Glycol (SYSTANE COMPLETE OP) Place 1 drop into both eyes daily as needed (dry eyes).   RESTASIS MULTIDOSE 0.05 % ophthalmic emulsion Place 1 drop into both eyes 2 (two) times daily.   rosuvastatin (CRESTOR) 40 MG tablet Take 40 mg by mouth at bedtime.   XIIDRA 5 % SOLN Place 1 drop into both eyes daily as needed (dry eye).   [DISCONTINUED] losartan (COZAAR) 25 MG tablet Take 25 mg by mouth daily.     Allergies:   Codeine, Oxycontin [oxycodone hcl], Contrast media [iodinated contrast media], and Morphine and related   Social History   Socioeconomic History   Marital status: Divorced    Spouse name: Not on file   Number of children: Not on file   Years of education: Not on file   Highest education level: Not on file  Occupational History   Occupation: retired    Comment: welder  Tobacco Use   Smoking status: Former    Packs/day: 2.00    Years: 20.00    Total pack years: 40.00    Types: Cigarettes    Quit date: 05/23/1988    Years since quitting: 33.5   Smokeless tobacco: Never   Tobacco comments:    smoked 2 packs per day for 20 years  Vaping Use   Vaping Use: Never used  Substance and Sexual Activity   Alcohol use: No   Drug use: No   Sexual activity: Yes  Other Topics Concern   Not on file  Social History Narrative   Not on file   Social Determinants of Health   Financial Resource Strain: Not on file  Food Insecurity: Not on file  Transportation Needs: Not on file  Physical Activity: Not on file  Stress: Not on file  Social Connections: Not on file     Family History: The patient's family history includes Coronary artery disease in his brother; Coronary artery disease (age of onset: 61) in his mother; Hypertension in his mother; Other (age of onset: 27) in his father; Other (age of onset: 22) in his sister; Skin cancer in his  brother. There is no history of Colon cancer, Pancreatic cancer, Stomach cancer, Esophageal cancer, or Liver disease.  ROS:   Please see the history of present illness.   All other systems reviewed and are negative.  Labs/Other Studies Reviewed:  The following studies were reviewed today:  Brain MRI 11/16/21  IMPRESSION: Acute infarction in the right PCA territory. Mild swelling but no evidence of hemorrhage or mass effect.   Small cluster of acute infarctions within the right cerebellar hemisphere.   Single punctate acute infarction along the cortical surface of the right frontoparietal junction near the vertex.   Old infarction of the right lateral temporal lobe. Old small vessel insults elsewhere within the cerebellum, brainstem and cerebral hemispheric white matter.  Carotid duplex 11/16/21  Summary:  Right Carotid: Velocities in the right ICA are consistent with a 1-39%  stenosis.   Left Carotid: Velocities in the left ICA are consistent with a 1-39%  stenosis.   Vertebrals:  Bilateral vertebral arteries demonstrate antegrade flow.  Subclavians: Normal flow hemodynamics were seen in bilateral subclavian               arteries.   *See table(s) above for measurements and observations.   LHC 11/15/21    Prox Cx to Mid Cx lesion is 20% stenosed.   Dist Cx lesion is 70% stenosed.   2nd Diag lesion is 80% stenosed.   2nd Mrg lesion is 60% stenosed.   RPDA lesion is 50% stenosed.   3rd Mrg lesion is 90% stenosed.   Dist RCA lesion is 90% stenosed.   Prox RCA to Mid RCA lesion is 80% stenosed.   Non-stenotic Prox LAD lesion was previously treated.   A stent was successfully placed.   A drug-eluting stent was successfully placed using a SYNERGY XD 2.50X32.   Post intervention, there is a 0% residual stenosis.   Post intervention, there is a 0% residual stenosis.   IMPRESSION: Mr. Provencal proximal LAD and mid AV groove circumflex stents were widely patent.  He did  have a 90% stenosis and a small third marginal branch.  The entire mid RCA had progressed and was diffusely diseased in the 80% range and he had a fairly focal 90% stenosis in the distal RCA as well as a diffusely diseased PDA.  I reviewed the angiograms with Dr. Burt Knack and we both agreed that the best option was stenting of the distal RCA and the entire mid RCA.  This was performed placing 2 overlapping stents in the distal RCA and along DES stent in the mid RCA which was postdilated to 3 mm.  The patient still is complaining of pain.  Because of this I have elected to place him in unit to H for closer observation.  He did have TIMI-3 flow at the end of the case.    Echo 11/14/21   1. Left ventricular ejection fraction, by estimation, is 60 to 65%. The  left ventricle has normal function. The left ventricle has no regional  wall motion abnormalities. There is mild left ventricular hypertrophy.  Left ventricular diastolic parameters  are consistent with Grade I diastolic dysfunction (impaired relaxation).  The average left ventricular global longitudinal strain is -13.3 %. The  global longitudinal strain is abnormal.   2. Right ventricular systolic function is normal. The right ventricular  size is normal.   3. The mitral valve is normal in structure. Trivial mitral valve  regurgitation. No evidence of mitral stenosis.   4. The aortic valve is tricuspid. Aortic valve regurgitation is not  visualized. No aortic stenosis is present.   5. The inferior vena cava is normal in size with greater than 50%  respiratory variability, suggesting right atrial pressure of 3 mmHg.   Comparison(s): No  significant change from prior study. Prior images  reviewed side by side. 08/23/20-EF 60-65%.   LHC 03/26/2019  Prox RCA lesion is 50% stenosed. Dist RCA lesion is 60% stenosed. RPDA lesion is 50% stenosed. Prox Cx to Mid Cx lesion is 90% stenosed. Prox LAD lesion is 99% stenosed. Scoring balloon angioplasty  was performed using a BALLOON WOLVERINE 2.50X10. Post intervention, there is a 20% residual stenosis. A drug-eluting stent was successfully placed using a STENT SYNERGY DES 2.75X20. Post intervention, there is a 0% residual stenosis. Dist Cx lesion is 70% stenosed. 2nd Mrg lesion is 60% stenosed. 2nd Diag lesion is 80% stenosed. The left ventricular systolic function is normal. LV end diastolic pressure is normal. The left ventricular ejection fraction is 50-55% by visual estimate. There is no mitral valve regurgitation.   1. Severe stenosis proximal LAD. Successful PTCA/DES x 1 proximal LAD 2. Severe restenosis in the mid Circumflex stented segment. Successful scoring balloon angioplasty within the old stented segment. No stent placed.  3. Moderate stenosis in the distal Circumflex branches.  4. Moderate restenosis within the old mid RCA stent. This does not appear to be flow limiting. Moderate distal RCA and PDA stenosis. 5. Low normal LV systolic function, LVEF around 50%.    Recommendations: DAPT with ASA and Plavix for at least one year. Continue beta blocker and statin   Recent Labs: 11/13/2021: Magnesium 2.1 11/15/2021: TSH 2.585 11/17/2021: Hemoglobin 11.7; Platelets 198 11/18/2021: ALT 18; BUN 19; Creatinine, Ser 1.42; Potassium 3.8; Sodium 138  Recent Lipid Panel    Component Value Date/Time   CHOL 124 11/14/2021 0245   CHOL 125 02/11/2019 0847   TRIG 164 (H) 11/14/2021 0245   HDL 32 (L) 11/14/2021 0245   HDL 45 02/11/2019 0847   CHOLHDL 3.9 11/14/2021 0245   VLDL 33 11/14/2021 0245   LDLCALC 59 11/14/2021 0245   LDLCALC 64 02/11/2019 0847     Risk Assessment/Calculations:      Physical Exam:    VS:  BP (!) 180/70   Pulse 64   Ht '5\' 7"'$  (1.702 m)   Wt 217 lb 6.4 oz (98.6 kg)   SpO2 95%   BMI 34.05 kg/m     Wt Readings from Last 3 Encounters:  11/26/21 217 lb 6.4 oz (98.6 kg)  11/14/21 219 lb 4.8 oz (99.5 kg)  02/10/21 216 lb 12.8 oz (98.3 kg)     GEN:   Well nourished, well developed in no acute distress HEENT: Normal NECK: No JVD; No carotid bruits CARDIAC: RRR, no murmurs, rubs, gallops RESPIRATORY:  Clear to auscultation without rales, wheezing or rhonchi  ABDOMEN: Soft, non-tender, oabese MUSCULOSKELETAL:  No edema; No deformity. 2+ pedal pulses, equal bilaterally SKIN: Warm and dry. Right radial cath site well healed with no bruising, no bruit.  NEUROLOGIC:  Alert and oriented x 3 PSYCHIATRIC:  Normal affect   EKG:  EKG is ordered today.  The ekg ordered today demonstrates sinus rhythm at 64 bpm with 1st degree AV block with PR 224 ms, NSIVB, TWI aVL, V6, no acute change from previous  Diagnoses:    1. Bilateral carotid artery stenosis   2. Coronary artery disease involving native coronary artery of native heart without angina pectoris   3. Medication management   4. History of CEA (carotid endarterectomy)   5. Cerebrovascular accident (CVA) due to embolism of right posterior cerebral artery ([place])   6. First degree AV block   7. Hyperlipidemia LDL goal <70    Assessment  and Plan:     CAD s/p DES x 2 RCA: LHC 6/26 revealed patent LAD and circumflex stents, received 2 overlapping stents to RCA and distal RCA and a long DES in mid RCA, complicated by loss of 2 small acute marginal branches with plan to treat residual disease medically. Bradycardia post-procedure, no further recurrence to our awareness. He denies chest pain, dyspnea, or other symptoms concerning for angina. No indication for further ischemic evaluation at this time. No bleeding problems. Will repeat CBC, BMET today. Continue aspirin, Plavix, carvedilol, Imdur, losartan, rosuvastatin.  S/p CVA: He reports slight change in vision of left eye, otherwise no residual deficits. No significant carotid artery stenoses by u/s. Is anxious to see neurology and cannot get an appointment until October. Is asking if he can resume driving.  Advised he will need to get clearance from  neurologist. Discussed with primary cardiologist, Dr. Johnsie Cancel, who advised no indication at this time for long-term cardiac monitor. Advised patient to notify us if he notes irregular heartbeat.  Getting repeat valproic acid level and ammonia level today for neurology follow-up.  1st degree AV block: EKG stable today at 64 bpm. He denies lightheadedness, presyncope, syncope. Will continue carvedilol at this time.   Carotid artery disease: History of right CEA by Dr. Donnetta Hutching 2019.  Carotid duplex 11/16/2021 with mild bilateral stenosis (1 to 39%).   Hypertension: BP is elevated today. It is even more elevated upon my recheck.  He reports an element of whitecoat hypertension. We will increase losartan to 50 mg daily. I have asked him to monitor at home and report consistent readings > 140/80. Will recheck bmet in 7-10 days.   Hyperlipidemia: LDL 59 on 11/14/21. Triglycerides were elevated at 164. Reports improved diet to include more heart healthy choices.  Continue rosuvastatin.    Disposition: 3 months with Dr. Johnsie Cancel    Medication Adjustments/Labs and Tests Ordered: Current medicines are reviewed at length with the patient today.  Concerns regarding medicines are outlined above.  Orders Placed This Encounter  Procedures   Ammonia   Valproic Acid level   Basic Metabolic Panel (BMET)   CBC   Basic Metabolic Panel (BMET)   Basic Metabolic Panel (BMET)   EKG 12-Lead   Meds ordered this encounter  Medications   losartan (COZAAR) 50 MG tablet    Sig: Take 1 tablet (50 mg total) by mouth daily.    Dispense:  90 tablet    Refill:  3    Patient Instructions  Medication Instructions:   INCREASE Losartan one (1) tablet by mouth ( 50 mg ) daily.   *If you need a refill on your cardiac medications before your next appointment, please call your pharmacy*   Lab Work:  TODAY!!!!!!! BMET/CBC/AMMONIA/VALPROIC ACID  Your physician recommends that you return for lab work in [place] on July  17 at any [facility] or at [facility].    If you have labs (blood work) drawn today and your tests are completely normal, you will receive your results only by: [place] (if you have MyChart) OR A paper copy in the mail If you have any lab test that is abnormal or we need to change your treatment, we will call you to review the results.   Testing/Procedures:   None ordered.   Follow-Up: At [facility], you and your health needs are our priority.  As part of our continuing mission to provide you with exceptional heart care, we have created designated Provider Care Teams.  These Care Teams include your primary Cardiologist (physician) and Advanced Practice Providers (APPs -  Physician Assistants and Nurse Practitioners) who all work together to provide you with the care you need, when you need it.  We recommend signing up for the patient portal called "MyChart".  Sign up information is provided on this After Visit Summary.  MyChart is used to connect with patients for Virtual Visits (Telemedicine).  Patients are able to view lab/test results, encounter notes, upcoming appointments, etc.  Non-urgent messages can be sent to your provider as well.   To learn more about what you can do with MyChart, go to [email or web address].    Your next appointment:   3 month(s)  The format for your next appointment:   In Person  Provider:   Jenkins Rouge, MD    Other Instructions  HOW TO TAKE YOUR BLOOD PRESSURE: Rest 5 minutes before taking your blood pressure.  Don't smoke or drink caffeinated beverages for at least 30 minutes before. Take your blood pressure before (not after) you eat. Sit comfortably with your back supported and both feet on the floor (don't cross your legs). Elevate your arm to heart level on a table or a desk. Use the proper sized cuff. It should fit smoothly and snugly around your bare upper arm. There should be enough room to slip a fingertip under the cuff. The  bottom edge of the cuff should be 1 inch above the crease of the elbow.  Please take your blood pressure and if it consistently stays above 140/80 X 3 please call office at [phone] or send mychart message.    Important Information About Sugar         Signed, Emmaline Life, NP  11/26/2021 10:36 AM    [place]

## 2021-11-18 NOTE — Progress Notes (Signed)
CARDIAC REHAB PHASE I   PRE:  Rate/Rhythm: 75 SR   BP:  Sitting: 148/52      SaO2: 97 RA  MODE:  Ambulation: 470 ft   POST:  Rate/Rhythm: 102 ST   BP:  Sitting: 172/65      SaO2: 98 RA  Ambulated well in [place] with no CP or SOB. Pt back to room sitting in chair with call bell and bedside table in reach. All questions and concerns addressed.   [id number]  Vanessa Barbara, RN BSN 11/18/2021 9:50 AM

## 2021-11-18 NOTE — Discharge Summary (Addendum)
Discharge Summary    Patient ID: Jeremy [name] MRN: [id number]; DOB: [birth year]  Admit date: 11/13/2021 Discharge date: 11/18/2021  PCP:  Celene Squibb, MD   [facility] Providers Cardiologist:  Jenkins Rouge, MD        Discharge Diagnoses    Principal Problem:   Unstable angina [facility]) Active Problems:   Mixed dyslipidemia   Essential hypertension   CAD in native artery   CKD (chronic kidney disease), stage III   Non-ST elevation (NSTEMI) myocardial infarction - periprocedural   Stroke [facility])   LBBB (left bundle branch block)   First degree AV block    Diagnostic Studies/Procedures     Carotid study 11/16/2021  Summary:  Right Carotid: Velocities in the right ICA are consistent with a 1-39%  stenosis.   Left Carotid: Velocities in the left ICA are consistent with a 1-39%  stenosis.   Vertebrals:  Bilateral vertebral arteries demonstrate antegrade flow.  Subclavians: Normal flow hemodynamics were seen in bilateral subclavian               arteries.   *See table(s) above for measurements and observations.    Cath 11/15/21   Prox Cx to Mid Cx lesion is 20% stenosed.   Dist Cx lesion is 70% stenosed.   2nd Diag lesion is 80% stenosed.   2nd Mrg lesion is 60% stenosed.   RPDA lesion is 50% stenosed.   3rd Mrg lesion is 90% stenosed.   Dist RCA lesion is 90% stenosed.   Prox RCA to Mid RCA lesion is 80% stenosed.   Non-stenotic Prox LAD lesion was previously treated.   A stent was successfully placed.   A drug-eluting stent was successfully placed using a SYNERGY XD 2.50X32.   Post intervention, there is a 0% residual stenosis.   Post intervention, there is a 0% residual stenosis.   PROCEDURE DESCRIPTION:    The patient was brought to the second floor [facility] cath lab in the postabsorptive state. He was premedicated with IV Versed and fentanyl. His right wrist was prepped and shaved in usual sterile fashion. Xylocaine 1% was used for local  anesthesia. A 6 French sheath was inserted into the right radial artery using standard Seldinger technique. The patient received 5000 units  of heparin intravenously.  A 5 [place] TIG catheter and right Judkins catheters were used for selective coronary and angiography and obtain left heart pressures.  Isovue dye is used for the entirety of the case (180 cc contrast total to patient).  Retrograde ordered, left ventricular and pullback pressures were recorded (LVEDP was 25).  Radial cocktail was administered via the SideArm sheath.  The patient received an additional 300 mg of clopidogrel p.o. in addition to Pepcid 20 mg IV.  He was on aspirin and clopidogrel at home but said he may have missed an occasional dose.  He received an additional 5000's of heparin with an ACT of 323.  Isovue dye is used for the entirety of the intervention.  Retroaortic pressures monitored in the case.  Using a 6 [place] AL-1 with sideholes along with 0.14 P[place]ater guidewire I was able to cross the proximal mid and distal RCA disease segments with little difficulty.  I postdilated all disease seconds with a 2.5 mm x 12 mm long balloon.  I then placed a 3 mm x 12 mm long Synergy drug-eluting stent in the distal lesion.  I overlapped this because of "hazy appearance in the proximal edge of the stent" with  a 3 oh 16 Synergy deployed at 14 to 16 atm.  I then placed a 2.5 x 32 mm long Synergy drug-eluting stent across the entire midportion of the RCA deployed at 14 to 16 atm.  I postdilated with a 3 mm x 15 mm long noncompliant balloon up to 14 to 16 atm resulting in reduction of a long 80% stenosis to 0% residual.  I did lose 2 small acute marginal branches.  With deployment of the distal stents the patient became bradycardic and hypotensive requiring intravenous atropine (3 mg) as well as Levophed.  Ultimately his blood pressure and heart rate stabilized.  He was complaining of chest pain at the end of the case.  His postprocedure EKG was  unchanged with left bundle branch block.  He was hemodynamically stable.    IMPRESSION: Mr. Corne proximal LAD and mid AV groove circumflex stents were widely patent.  He did have a 90% stenosis and a small third marginal branch.  The entire mid RCA had progressed and was diffusely diseased in the 80% range and he had a fairly focal 90% stenosis in the distal RCA as well as a diffusely diseased PDA.  I reviewed the angiograms with Dr. Burt Knack and we both agreed that the best option was stenting of the distal RCA and the entire mid RCA.  This was performed placing 2 overlapping stents in the distal RCA and along DES stent in the mid RCA which was postdilated to 3 mm.  The patient still is complaining of pain.  Because of this I have elected to place him in unit to H for closer observation.  He did have TIMI-3 flow at the end of the case.   Quay Burow. MD, [facility] 11/15/2021 11:05 AM   2D echo 11/14/21   1. Left ventricular ejection fraction, by estimation, is 60 to 65%. The  left ventricle has normal function. The left ventricle has no regional  wall motion abnormalities. There is mild left ventricular hypertrophy.  Left ventricular diastolic parameters  are consistent with Grade I diastolic dysfunction (impaired relaxation).  The average left ventricular global longitudinal strain is -13.3 %. The  global longitudinal strain is abnormal.   2. Right ventricular systolic function is normal. The right ventricular  size is normal.   3. The mitral valve is normal in structure. Trivial mitral valve  regurgitation. No evidence of mitral stenosis.   4. The aortic valve is tricuspid. Aortic valve regurgitation is not  visualized. No aortic stenosis is present.   5. The inferior vena cava is normal in size with greater than 50%  respiratory variability, suggesting right atrial pressure of 3 mmHg.   Comparison(s): No significant change from prior study. Prior images  reviewed side by side. 08/23/20-EF  60-65%.       _____________   History of Present Illness     Jeremy [name] is a 76 y.o. male with CAD (remote stent to RCA, then PTCA to Cx +  DES to pLAD in 03/2019 with residual disease treated medically), recovered cardiomyopathy (EF 35-40% in 05/2018 at time of stroke then normal since then), CKD stage 3a-b by labs, mild aortic dilation (normal by echo this admission), HTN, HLD, PVD with history of CVA and right CEA 2019 by Dr. Donnetta Hutching, prior seizure in 2020, LBBB, first degree AVB who presented to [facility] with chest pain concerning for unstable angina. Upon presentation to the ER Saturday 6/24 he reported intermittent CP the preceding 2-3 days, worse with  activity and improved with rest, felt similar to prior angina. EKG showed NSR with known LBBB/1st degree AVB. Labs showed negative troponin x 3 and he was admitted to the cardiology service for further management. He was started on IV heparin with plan for cath the following Monday 11/15/21.  Hospital Course     1. CAD with unstable angina  - initial admit troponins negative x3 - 2D echo 11/14/21 EF 60-65%, 1 DD, trivial MR - cath 11/15/21 with findings above with patent LAD and Cx stents, received 2 overlapping stents to the RCA in the distal RCA and along DES in the mid RCA, complicated by loss of 2 small acute marginal branches; residual disease treated medically - with deployment of the distal stents, the patient became bradycardic and hypotensive requiring intravenous atropine as well as Levophed - ultimately his blood pressure and heart rate stabilized. He was complaining of chest pain at the end of the case and EKG was felt unchanged. He was observed in the unit. F/u troponin 6/26 was 6227 was felt to represent peri-procedural MI. - later in the evening, he developed altered mental status and findings c/w stroke as outlined in #2. - will go home on prior-to-admission ASA + Plavix + rosuvastatin + Imdur + carvedilol  -  omeprazole changed to pantoprazole given drug interaction with Plavix   2. Post-cath altered mental status/stroke (with prior hx of stroke and seizure) - later in the evening the day of his catheterization, he developed aphasia, confusion and combativeness prompting urgent neurology evaluation with imaging raising concern for stroke - per neuro, right PCA, cerebellar and punctate frontoparietal infarcts were felt embolic likely related to cardiac cath procedure (a-fib could not be completely ruled out but not reported from telemetry monitoring). Dr. Burt Knack did not feel event monitor was warranted at this time - carotid duplex 1-39% BICA this admission - echo with normal EF this admission - continue DAPT and statin at discharge - regarding prior history of seizure, was previously off Dilantin, Keppra and Vimpat in prior outpatient neurology visits due to cognitive side effects -> neurology recommended to re-load with Depakote as it also has some mood-stabilizing effects (wife had reported some concern over recent personality changes) - originally neurology recommended to obtain a day 3 valproic acid level and ammonia level on 11/19/21, however, patient is stable for discharge today from cardiac standpoint. I reviewed lab plans with neurologist Dr. Erlinda Hong since pt is otherwise stable for DC - he feels the patient would not necessarily need to remain inpatient for these labs but instead feels it would be OK to obtain these levels in 1 week at post-hospital cardiology follow-up. Per pharmD, these should be drawn during the morning hours before he takes his AM Depakote dose that day. We arranged early morning TOC follow-up on 11/26/21 at which time these can be ordered - did not place lab order in case patient has any recurrent symptoms requiring other other labs at that visit, should be ordered by APP seeing the patient. These levels can then be communicated with his primary neurologist, Dr. Leonie Man. We included on his  discharge paperwork NOT to take his AM divalproex dose that day, but to bring with him to visit so he can take it after he gets bloodwork drawn - neuro also recommends to continue f/u with Dr. Leonie Man at [facility], information provided on ACS - mental status back to baseline at discharge, A+Ox3 - advised not to drive until cleared by neurology   3. Essential  HTN - mildly elevated today prompting return back to carvedilol, was briefly on metoprolol to avoid BP running too low post-stroke - otherwise to continue Imdur, losartan at discharge - these were dosed QHS while inpatient - he was advised he may continue to take them at bedtime or if he wishes to return to AM dosing to gradually move them back over the next week  4. Mixed hyperlipidemia - lipid panel with trig 164, HDL 32, LDL 59 -> continue PTA rosuvastatin at discharge   5. Carotid artery disease s/p R CEA - carotid duplex this admission with 1-39% BICA, continue to follow as OP   6. LBBB/first degree AVB - chronic for patient - had bradycardia/hypotension with deployment of stents but otherwise telemetry stable since then   7. CKD stage 3a-b - pre-cath Cr around 1.5 range, discharge Cr stable at 1.42  Dr. Burt Knack has seen and examined the patient today and feels he is stable for discharge. TOC follow-up arranged below. Discharge instructions outlined below.    Did the patient have an acute coronary syndrome (MI, NSTEMI, STEMI, etc) this admission?:  Yes                               AHA/ACC Clinical Performance & Quality Measures: Aspirin prescribed? - Yes ADP Receptor Inhibitor (Plavix/Clopidogrel, Brilinta/Ticagrelor or Effient/Prasugrel) prescribed (includes medically managed patients)? - Yes Beta Blocker prescribed? - Yes High Intensity Statin (Lipitor 40-80mg  or Crestor 20-40mg ) prescribed? - Yes EF assessed during THIS hospitalization? - Yes For EF <40%, was ACEI/ARB prescribed? - Not Applicable (EF >/= 28%) For EF <40%,  Aldosterone Antagonist (Spironolactone or Eplerenone) prescribed? - Not Applicable (EF >/= 76%) Cardiac Rehab Phase II ordered (including medically managed patients)? - Yes       The patient will be scheduled for a TOC follow up appointment in 7-10 days.  A message has been sent to the [facility] and Scheduling Pool at the office where the patient should be seen for follow up.  _____________  Discharge Vitals Blood pressure (!) 155/68, pulse 78, temperature 98.3 F (36.8 C), temperature source Oral, resp. rate 18, height 5\' 7"  (1.702 m), weight 99.5 kg, SpO2 92 %.  Filed Weights   11/13/21 1533 11/13/21 1942 11/14/21 0151  Weight: 98.3 kg 99.7 kg 99.5 kg    Labs & Radiologic Studies    CBC Recent Labs    11/15/21 2020 11/15/21 2111 11/16/21 0253 11/17/21 0028  WBC 7.1  --  6.0 5.6  NEUTROABS 5.3  --   --   --   HGB 12.5*   < > 11.9* 11.7*  HCT 40.0   < > 40.4 35.9*  MCV 86.4  --  91.0 84.7  PLT 218  --  182 198   < > = values in this interval not displayed.   Basic Metabolic Panel Recent Labs    11/16/21 0253 11/18/21 1006  NA 140 138  K 4.6 3.8  CL 106 107  CO2 23 22  GLUCOSE 91 137*  BUN 16 19  CREATININE 1.62* 1.42*  CALCIUM 8.7* 8.8*   Liver Function Tests Recent Labs    11/18/21 1006  AST 26  ALT 18  ALKPHOS 47  BILITOT 0.8  PROT 5.8*  ALBUMIN 3.2*   No results for input(s): "LIPASE", "AMYLASE" in the last 72 hours. High Sensitivity Troponin:   Recent Labs  Lab 11/13/21 1503 11/13/21 1620 11/14/21 0725 11/15/21 2020  TROPONINIHS '10 8 6 '$ 6,227*    Thyroid Function Tests Recent Labs    11/15/21 2111  TSH 2.585   _____________  VAS US CAROTID  Result Date: 11/16/2021 Carotid Arterial Duplex Study Patient Name:  Jeremy [name]  Date of Exam:   11/16/2021 Medical Rec #: [id number]       Accession #:    [id number] Date of Birth: Nov 08, 1945       Patient Gender: M Patient Age:   43 years Exam Location:  [facility] Procedure:      VAS US  CAROTID Referring Phys: Charlene Brooke --------------------------------------------------------------------------------  Indications:       Right endarterectomy. Risk Factors:      Hypertension, hyperlipidemia, past history of smoking,                    coronary artery disease. Comparison Study:  02/10/2021 Carotid artery duplex- 1-39% ICA stenosis                    bilaterally. Performing Technologist: Maudry Mayhew MHA, RDMS, RVT, RDCS  Examination Guidelines: A complete evaluation includes B-mode imaging, spectral Doppler, color Doppler, and power Doppler as needed of all accessible portions of each vessel. Bilateral testing is considered an integral part of a complete examination. Limited examinations for reoccurring indications may be performed as noted.  Right Carotid Findings: +----------+--------+--------+--------+------------------+------------------+           PSV cm/sEDV cm/sStenosisPlaque DescriptionComments           +----------+--------+--------+--------+------------------+------------------+ CCA Prox  86      12                                intimal thickening +----------+--------+--------+--------+------------------+------------------+ CCA Distal71      12                                intimal thickening +----------+--------+--------+--------+------------------+------------------+ ICA Prox  41      11      1-39%                                        +----------+--------+--------+--------+------------------+------------------+ ICA Distal140     36                                                   +----------+--------+--------+--------+------------------+------------------+ ECA       106     15                                                   +----------+--------+--------+--------+------------------+------------------+ +----------+--------+-------+----------------+-------------------+           PSV cm/sEDV cmsDescribe        Arm Pressure  (mmHG) +----------+--------+-------+----------------+-------------------+ [id number]            Multiphasic, WNL                    +----------+--------+-------+----------------+-------------------+ +---------+--------+--+--------+--+---------+ VertebralPSV cm/s83EDV cm/s20Antegrade +---------+--------+--+--------+--+---------+  Left Carotid Findings: +----------+--------+-------+--------+----------------------+------------------+  PSV cm/sEDV    StenosisPlaque Description    Comments                             cm/s                                                    +----------+--------+-------+--------+----------------------+------------------+ CCA Prox  88      22                                   intimal thickening +----------+--------+-------+--------+----------------------+------------------+ CCA Distal63      16                                   intimal thickening +----------+--------+-------+--------+----------------------+------------------+ ICA Prox  117     36     1-39%   heterogenous and                                                          smooth                                   +----------+--------+-------+--------+----------------------+------------------+ ICA Mid   138     38                                                      +----------+--------+-------+--------+----------------------+------------------+ ICA Distal128     37                                                      +----------+--------+-------+--------+----------------------+------------------+ ECA       123     10                                                      +----------+--------+-------+--------+----------------------+------------------+ +----------+--------+--------+----------------+-------------------+           PSV cm/sEDV cm/sDescribe        Arm Pressure (mmHG)  +----------+--------+--------+----------------+-------------------+ [id number]             Multiphasic, WNL                    +----------+--------+--------+----------------+-------------------+ +---------+--------+--+--------+--+ VertebralPSV cm/s70EDV cm/s12 +---------+--------+--+--------+--+   Summary: Right Carotid: Velocities in the right ICA are consistent with a 1-39% stenosis. Left Carotid: Velocities in the left ICA are consistent with a 1-39% stenosis. Vertebrals:  Bilateral vertebral arteries demonstrate antegrade flow. Subclavians: Normal flow hemodynamics were seen in bilateral subclavian  arteries. *See table(s) above for measurements and observations.  Electronically signed by Harold Barban MD on 11/16/2021 at 10:47:10 PM.    Final    MR BRAIN WO CONTRAST  Result Date: 11/16/2021 CLINICAL DATA:  Neuro deficit, acute, stroke suspected. EXAM: MRI HEAD WITHOUT CONTRAST TECHNIQUE: Multiplanar, multiecho pulse sequences of the brain and surrounding structures were obtained without intravenous contrast. COMPARISON:  11/15/2021 CT.  06/20/2018 MRI. FINDINGS: Brain: Small grouping of acute infarctions within the inferior cerebellum on the right. Acute infarction affecting the posteromedial temporal lobe and occipital lobe on the right, PCA territory. Mild swelling but no blood products visible in that area. Punctate acute infarction along the cortical surface in the right frontoparietal junction region. Elsewhere, there chronic small-vessel ischemic changes of the pons. Old small vessel cerebellar infarctions are present. There is chronic infarction of the right posterolateral temporal lobe and there are mild chronic small-vessel ischemic changes of the cerebral hemispheric white matter. No hydrocephalus. No extra-axial collection. Vascular: Major vessels at the base of the brain show flow. Skull and upper cervical spine: Negative Sinuses/Orbits: Clear/normal Other: None  IMPRESSION: Acute infarction in the right PCA territory. Mild swelling but no evidence of hemorrhage or mass effect. Small cluster of acute infarctions within the right cerebellar hemisphere. Single punctate acute infarction along the cortical surface of the right frontoparietal junction near the vertex. Old infarction of the right lateral temporal lobe. Old small vessel insults elsewhere within the cerebellum, brainstem and cerebral hemispheric white matter. Electronically Signed   By: Nelson Chimes M.D.   On: 11/16/2021 14:50   EEG adult  Result Date: 11/15/2021 Lora Havens, MD     11/15/2021  9:48 PM Patient Name: Jeremy [name] MRN: [id number] Epilepsy Attending: Lora Havens Referring Physician/Provider: Amie Portland, MD Date:11/15/2021 Duration: 23 mins Patient history: 76 year old man with above past medical history, status post cardiac cath today after which she has been noted to have some confusion, dysarthria. EEG to evaluate for seizure. Level of alertness: Awake AEDs during EEG study: None Technical aspects: This EEG study was done with scalp electrodes positioned according to the 10-20 International system of electrode placement. Electrical activity was acquired at a sampling rate of $Remov'500Hz'ZYbTUQ$  and reviewed with a high frequency filter of $RemoveB'70Hz'mSeVIHPz$  and a low frequency filter of $RemoveB'1Hz'AGrYbUYp$ . EEG data were recorded continuously and digitally stored. Description: No posterior dominant rhythm was seen. EEG showed continuous generalized 3 to 6 Hz theta-delta slowing.Hyperventilation and photic stimulation were not performed.   ABNORMALITY - Continuous slow, generalized IMPRESSION: This study is suggestive of moderate diffuse encephalopathy, nonspecific etiology. No seizures or epileptiform discharges were seen throughout the recording. Priyanka Barbra Sarks   CT ANGIO HEAD NECK W WO CM W PERF (CODE STROKE)  Result Date: 11/15/2021 CLINICAL DATA:  Initial evaluation for neuro deficit, stroke suspected. EXAM: CT  ANGIOGRAPHY HEAD AND NECK CT PERFUSION BRAIN TECHNIQUE: Multidetector CT imaging of the head and neck was performed using the standard protocol during bolus administration of intravenous contrast. Multiplanar CT image reconstructions and MIPs were obtained to evaluate the vascular anatomy. Carotid stenosis measurements (when applicable) are obtained utilizing NASCET criteria, using the distal internal carotid diameter as the denominator. Multiphase CT imaging of the brain was performed following IV bolus contrast injection. Subsequent parametric perfusion maps were calculated using RAPID software. RADIATION DOSE REDUCTION: This exam was performed according to the departmental dose-optimization program which includes automated exposure control, adjustment of the mA and/or kV according to patient size and/or use  of iterative reconstruction technique. CONTRAST:  142mL OMNIPAQUE IOHEXOL 350 MG/ML SOLN COMPARISON:  Prior CT from earlier the same day. FINDINGS: CTA NECK FINDINGS Aortic arch: Examination technically limited by extensive motion artifact. Visualized aortic arch normal caliber with standard branching pattern. Moderate atheromatous change seen about the arch and origin of the great vessels. No obvious high-grade stenosis on this limited exam. Right carotid system: Right common and internal carotid arteries are tortuous but grossly patent without visible stenosis or dissection. Left carotid system: Left common and internal carotid arteries are tortuous. Left CCA patent from its origin to the bifurcation without visible stenosis. Evaluation of the left carotid bulb severely limited by motion, with possible high-grade stenosis not excluded (series 8, image 191). Left ICA patent distally without stenosis or dissection. Vertebral arteries: Both vertebral arteries arise from the subclavian arteries. No visible proximal subclavian artery stenosis. Strongly dominant right vertebral artery with diffusely hypoplastic  left vertebral artery. Moderate atheromatous change about the right V3 segment with associated mild to moderate stenosis. Visualized vertebral arteries otherwise patent within the neck. Skeleton: No discrete or worrisome osseous lesions. Moderate multilevel cervical spondylosis. Other neck: No other acute soft tissue abnormality within the neck. Small sebaceous cyst noted within the subcutaneous fat of the left upper back. Upper chest: Visualized upper chest demonstrates no other acute finding. Review of the MIP images confirms the above findings CTA HEAD FINDINGS Anterior circulation: Examination technically limited by extensive motion artifact. Petrous segments grossly patent bilaterally. Atheromatous change seen throughout the carotid siphons with mild-to-moderate stenoses. A1 segments patent. Grossly normal anterior communicating complex. The visualized ACAs grossly patent to their distal aspects without appreciable stenosis. No M1 stenosis or occlusion. No visible proximal MCA branch occlusion. Distal MCA branches grossly perfused and symmetric. Posterior circulation: Dominant right V 4 segment patent without stenosis. Right PICA grossly patent at its origin. Hypoplastic left vertebral artery appears to largely terminate in PICA. Left PICA grossly patent as well. Basilar patent to its distal aspect without appreciable stenosis. Superior cerebellar arteries patent proximally. Both PCAs primarily supplied via the basilar. Multifocal atheromatous irregularity throughout the PCAs which remain patent to their distal aspects. Venous sinuses: Visualized major dural sinuses are grossly patent allowing for timing the contrast bolus. Anatomic variants: As above. No obvious aneurysm or other vascular malformation. Review of the MIP images confirms the above findings CT Brain Perfusion Findings: ASPECTS: 10. CBF (<30%) Volume: 103mL Perfusion (Tmax>6.0s) volume: 824mL Mismatch Volume: 859mL Infarction Location:Examination  technically limited by motion artifact. No evidence for acute ischemia or core infarct. Apparent widespread delayed perfusion throughout both cerebral and cerebellar hemispheres felt to be consistent with artifact. IMPRESSION: 1. Technically limited exam due to extensive motion artifact. 2. Grossly negative CTA. No large vessel occlusion or other visible emergent finding. 3. Severely limited evaluation of the left carotid bulb, with possible high-grade stenosis not excluded. Correlation with dedicated carotid Doppler ultrasound suggested as clinically warranted. Alternatively, a repeat examination when the patient is able to tolerate the study could be performed as well. 4. No evidence for acute ischemia or core infarct. Apparent widespread delayed perfusion throughout both cerebral and cerebellar hemispheres felt to be consistent with artifact. Electronically Signed   By: Jeannine Boga M.D.   On: 11/15/2021 21:36   CT HEAD CODE STROKE WO CONTRAST  Result Date: 11/15/2021 CLINICAL DATA:  Code stroke. EXAM: CT HEAD WITHOUT CONTRAST TECHNIQUE: Contiguous axial images were obtained from the base of the skull through the vertex without intravenous contrast. RADIATION  DOSE REDUCTION: This exam was performed according to the departmental dose-optimization program which includes automated exposure control, adjustment of the mA and/or kV according to patient size and/or use of iterative reconstruction technique. COMPARISON:  Prior CT from 05/21/2020. FINDINGS: Brain: Examination technically limited by positioning and motion artifact. Age-related cerebral atrophy with chronic small vessel ischemic disease. Chronic encephalomalacia involving the right temporal occipital region noted, grossly stable. No visible acute large vessel territory infarct. No visible acute intracranial hemorrhage. No mass lesion, mass effect or midline shift. No hydrocephalus or extra-axial fluid collection. Vascular: No convincing  hyperdense vessel. Skull: Scalp soft tissues and calvarium demonstrate no acute finding. Sinuses/Orbits: Globes and orbital soft tissues grossly within normal limits. Paranasal sinuses are largely clear. No obvious mastoid effusion. Other: None. ASPECTS [facility] Stroke Program Early CT Score) - Ganglionic level infarction (caudate, lentiform nuclei, internal capsule, insula, M1-M3 cortex): 7 - Supraganglionic infarction (M4-M6 cortex): 3 Total score (0-10 with 10 being normal): 10 IMPRESSION: 1. Technically limited exam due to motion artifact. No definite acute intracranial abnormality. 2. ASPECTS is 10. 3. Atrophy with chronic small vessel ischemic disease, with chronic right cerebral infarct. These results were communicated to Dr. Rory Percy at 8:30 pm on 11/15/2021 by text page via the [facility] messaging system. Electronically Signed   By: Jeannine Boga M.D.   On: 11/15/2021 20:41   CARDIAC CATHETERIZATION  Result Date: 11/15/2021 Images from the original result were not included.   Prox Cx to Mid Cx lesion is 20% stenosed.   Dist Cx lesion is 70% stenosed.   2nd Diag lesion is 80% stenosed.   2nd Mrg lesion is 60% stenosed.   RPDA lesion is 50% stenosed.   3rd Mrg lesion is 90% stenosed.   Dist RCA lesion is 90% stenosed.   Prox RCA to Mid RCA lesion is 80% stenosed.   Non-stenotic Prox LAD lesion was previously treated.   A stent was successfully placed.   A drug-eluting stent was successfully placed using a SYNERGY XD 2.50X32.   Post intervention, there is a 0% residual stenosis.   Post intervention, there is a 0% residual stenosis. Jeremy [name] is a 76 y.o. male  [id number] LOCATION:  FACILITY: [facility] PHYSICIAN: Quay Burow, M.D. [birth year] DATE OF PROCEDURE:  11/15/2021 DATE OF DISCHARGE: CARDIAC CATHETERIZATION / PCI DES RCA History obtained from chart review.76 y.o. male with chest pressure concerning for unstable angina.  Thankfully troponins have been normal.  EKG unchanged left bundle branch block.   He states this is the same as he had felt prior to his previous heart attacks and stent placements. PROCEDURE DESCRIPTION: The patient was brought to the second floor [facility]iac cath lab in the postabsorptive state. He was premedicated with IV Versed and fentanyl. His right wrist was prepped and shaved in usual sterile fashion. Xylocaine 1% was used for local anesthesia. A 6 French sheath was inserted into the right radial artery using standard Seldinger technique. The patient received 5000 units  of heparin intravenously.  A 5 [place] TIG catheter and right Judkins catheters were used for selective coronary and angiography and obtain left heart pressures.  Isovue dye is used for the entirety of the case (180 cc contrast total to patient).  Retrograde ordered, left ventricular and pullback pressures were recorded (LVEDP was 25).  Radial cocktail was administered via the SideArm sheath. The patient received an additional 300 mg of clopidogrel p.o. in addition to Pepcid 20 mg IV.  He was on aspirin and clopidogrel  at home but said he may have missed an occasional dose.  He received an additional 5000's of heparin with an ACT of 323.  Isovue dye is used for the entirety of the intervention.  Retroaortic pressures monitored in the case. Using a 6 [place] AL-1 with sideholes along with 0.14 [place] guidewire I was able to cross the proximal mid and distal RCA disease segments with little difficulty.  I postdilated all disease seconds with a 2.5 mm x 12 mm long balloon.  I then placed a 3 mm x 12 mm long Synergy drug-eluting stent in the distal lesion.  I overlapped this because of "hazy appearance in the proximal edge of the stent" with a 3 oh 16 Synergy deployed at 14 to 16 atm.  I then placed a 2.5 x 32 mm long Synergy drug-eluting stent across the entire midportion of the RCA deployed at 14 to 16 atm.  I postdilated with a 3 mm x 15 mm long noncompliant balloon up to 14 to 16 atm resulting in reduction of a  long 80% stenosis to 0% residual.  I did lose 2 small acute marginal branches. With deployment of the distal stents the patient became bradycardic and hypotensive requiring intravenous atropine (3 mg) as well as Levophed.  Ultimately his blood pressure and heart rate stabilized.  He was complaining of chest pain at the end of the case.  His postprocedure EKG was unchanged with left bundle branch block.  He was hemodynamically stable.   Mr. Maneri proximal LAD and mid AV groove circumflex stents were widely patent.  He did have a 90% stenosis and a small third marginal branch.  The entire mid RCA had progressed and was diffusely diseased in the 80% range and he had a fairly focal 90% stenosis in the distal RCA as well as a diffusely diseased PDA.  I reviewed the angiograms with Dr. Burt Knack and we both agreed that the best option was stenting of the distal RCA and the entire mid RCA.  This was performed placing 2 overlapping stents in the distal RCA and along DES stent in the mid RCA which was postdilated to 3 mm.  The patient still is complaining of pain.  Because of this I have elected to place him in unit to H for closer observation.  He did have TIMI-3 flow at the end of the case. Quay Burow. MD, [facility] 11/15/2021 11:05 AM    ECHOCARDIOGRAM COMPLETE  Result Date: 11/14/2021    ECHOCARDIOGRAM REPORT   Patient Name:   Jeremy [name] Date of Exam: 11/14/2021 Medical Rec #:  [id number]      Height:       67.0 in Accession #:    [id number]     Weight:       219.3 lb Date of Birth:  03/20/1946      BSA:          2.103 m Patient Age:    64 years       BP:           124/64 mmHg Patient Gender: M              HR:           74 bpm. Exam Location:  Inpatient Procedure: 2D Echo, Cardiac Doppler and Color Doppler Indications:    R07.9* Chest pain, unspecified  History:        Patient has prior history of Echocardiogram examinations, most  recent 08/23/2020. Previous Myocardial Infarction and CAD, Carotid                  Disease and CKD, Signs/Symptoms:Shortness of Breath; Risk                 Factors:Hypertension, Dyslipidemia and Former Smoker. S/P PCT of                 LAD and RCA.  Sonographer:    Wilkie Aye RVT Referring Phys: [place] Comments: Suboptimal parasternal window. IMPRESSIONS  1. Left ventricular ejection fraction, by estimation, is 60 to 65%. The left ventricle has normal function. The left ventricle has no regional wall motion abnormalities. There is mild left ventricular hypertrophy. Left ventricular diastolic parameters are consistent with Grade I diastolic dysfunction (impaired relaxation). The average left ventricular global longitudinal strain is -13.3 %. The global longitudinal strain is abnormal.  2. Right ventricular systolic function is normal. The right ventricular size is normal.  3. The mitral valve is normal in structure. Trivial mitral valve regurgitation. No evidence of mitral stenosis.  4. The aortic valve is tricuspid. Aortic valve regurgitation is not visualized. No aortic stenosis is present.  5. The inferior vena cava is normal in size with greater than 50% respiratory variability, suggesting right atrial pressure of 3 mmHg. Comparison(s): No significant change from prior study. Prior images reviewed side by side. 08/23/20-EF 60-65%. FINDINGS  Left Ventricle: Left ventricular ejection fraction, by estimation, is 60 to 65%. The left ventricle has normal function. The left ventricle has no regional wall motion abnormalities. The average left ventricular global longitudinal strain is -13.3 %. The global longitudinal strain is abnormal. The left ventricular internal cavity size was normal in size. There is mild left ventricular hypertrophy. Left ventricular diastolic parameters are consistent with Grade I diastolic dysfunction (impaired relaxation). Right Ventricle: The right ventricular size is normal. No increase in right ventricular wall thickness. Right  ventricular systolic function is normal. Left Atrium: Left atrial size was normal in size. Right Atrium: Right atrial size was normal in size. Pericardium: There is no evidence of pericardial effusion. Presence of epicardial fat layer. Mitral Valve: The mitral valve is normal in structure. Trivial mitral valve regurgitation. No evidence of mitral valve stenosis. Tricuspid Valve: The tricuspid valve is normal in structure. Tricuspid valve regurgitation is not demonstrated. No evidence of tricuspid stenosis. Aortic Valve: The aortic valve is tricuspid. Aortic valve regurgitation is not visualized. No aortic stenosis is present. Aortic valve mean gradient measures 4.0 mmHg. Aortic valve peak gradient measures 5.8 mmHg. Pulmonic Valve: The pulmonic valve was normal in structure. Pulmonic valve regurgitation is not visualized. No evidence of pulmonic stenosis. Aorta: The aortic root is normal in size and structure. Venous: The inferior vena cava is normal in size with greater than 50% respiratory variability, suggesting right atrial pressure of 3 mmHg. IAS/Shunts: No atrial level shunt detected by color flow Doppler.  LEFT VENTRICLE PLAX 2D LVIDd:         4.10 cm     Diastology LVIDs:         2.30 cm     LV e' medial:    5.07 cm/s LV PW:         1.20 cm     LV E/e' medial:  15.7 LV IVS:        1.30 cm     LV e' lateral:   6.76 cm/s  LV E/e' lateral: 11.8  LV Volumes (MOD)           2D Longitudinal Strain LV vol d, MOD A2C: 89.2 ml 2D Strain GLS Avg:     -13.3 % LV vol d, MOD A4C: 89.1 ml LV vol s, MOD A2C: 42.3 ml LV vol s, MOD A4C: 39.8 ml LV SV MOD A2C:     46.9 ml LV SV MOD A4C:     89.1 ml LV SV MOD BP:      49.3 ml RIGHT VENTRICLE             IVC RV Basal diam:  3.30 cm     IVC diam: 1.70 cm RV Mid diam:    2.30 cm RV S prime:     14.35 cm/s TAPSE (M-mode): 2.0 cm LEFT ATRIUM             Index        RIGHT ATRIUM           Index LA diam:        3.40 cm 1.62 cm/m   RA Area:     17.00 cm LA  Vol (A2C):   61.5 ml 29.24 ml/m  RA Volume:   48.00 ml  22.82 ml/m LA Vol (A4C):   29.1 ml 13.84 ml/m LA Biplane Vol: 44.0 ml 20.92 ml/m  AORTIC VALVE                   PULMONIC VALVE AV Vmax:           120.00 cm/s PV Vmax:       1.14 m/s AV Vmean:          89.000 cm/s PV Peak grad:  5.2 mmHg AV VTI:            0.271 m AV Peak Grad:      5.8 mmHg AV Mean Grad:      4.0 mmHg LVOT Vmax:         102.00 cm/s LVOT Vmean:        77.500 cm/s LVOT VTI:          0.239 m LVOT/AV VTI ratio: 0.88  AORTA Ao Root diam: 2.75 cm Ao Asc diam:  3.40 cm MITRAL VALVE MV Area (PHT): 3.00 cm     SHUNTS MV Decel Time: 253 msec     Systemic VTI: 0.24 m MV E velocity: 79.70 cm/s MV A velocity: 100.00 cm/s MV E/A ratio:  0.80 Candee Furbish MD Electronically signed by Candee Furbish MD Signature Date/Time: 11/14/2021/10:14:35 AM    Final    DG Chest 2 View  Result Date: 11/13/2021 CLINICAL DATA:  Chest pain. EXAM: CHEST - 2 VIEW COMPARISON:  09/18/2020 FINDINGS: Normal sized heart. Clear lungs with normal vascularity. Mildly tortuous and partially calcified thoracic aorta. Thoracic spine and bilateral shoulder degenerative changes. IMPRESSION: No active cardiopulmonary disease. Electronically Signed   By: Claudie Revering M.D.   On: 11/13/2021 16:03    Disposition   Pt is being discharged home today in good condition.  Follow-up Plans & Appointments     Follow-up Information     Garvin Fila, MD. Schedule an appointment as soon as possible for a visit in 1 month(s).   Specialties: Neurology, Radiology Why: stroke clinic Contact information: [address] [place] [address] [place], Lanice Schwab, NP Follow up.   Specialty: Nurse Practitioner Why: [facility] -  a follow-up appointment has been made for you on Friday Nov 26, 2021 at 8:25 AM (Arrive by 8:10 AM). The morning of your appointment, do not take that morning's divalproex dose until after you get your labs drawn - bring your  medicine to the appointment so you can take the dose after the bloodwork is drawn. Contact information: [address] [place] [address] [phone]                Discharge Instructions     Amb Referral to Cardiac Rehabilitation   Complete by: As directed    Diagnosis:  Coronary Stents PTCA     After initial evaluation and assessments completed: Virtual Based Care may be provided alone or in conjunction with Phase 2 Cardiac Rehab based on patient barriers.: Yes   Ambulatory referral to Neurology   Complete by: As directed    Follow up with Dr. Leonie Man at [facility] in 4-6 weeks. Too complicated for RN to follow. Thanks.   Diet - low sodium heart healthy   Complete by: As directed    Discharge instructions   Complete by: As directed    Some studies suggest Prilosec/Omeprazole (acid reflux medicine) interacts with Plavix/Clopidogrel (blood thinner). We changed your Prilosec/Omeprazole to the equivalent dose of Protonix/Pantoprazole for less chance of interaction. Please speak with your primary care provider the long-term plan for this medicine, as some patients are not on this class of medicine long term while others are.  Your neurology team also recommended you start divalproex (Depakote). The morning of your upcoming cardiology appointment on 11/26/21, do not take your morning dose of divalproex (Depakote) - you will have labwork drawn at that visit. You should bring your bottle to your visit so that you can take your morning dose after those labs are drawn. Further refills of your divalproex should come from your neurologist.  Your cardiology team changed your losartan and isosorbide to nighttime dosing here in the hospital when your blood pressure was on the lower side. Your blood pressure has now begun to come back up. You can continue to take them at bedtime if you wish. Otherwise if you prefer to take them them in the morning, you can gradually move your dose back over the  next week back to the morning time.   Increase activity slowly   Complete by: As directed    No driving until cleared by your neurologist. No driving for 1 week. No lifting over 10 lbs for 2 weeks. No sexual activity for 2 weeks. Keep procedure site clean & dry. If you notice increased pain, swelling, bleeding or pus, call/return!  You may shower, but no soaking baths/hot tubs/pools for 1 week.       Discharge Medications   Allergies as of 11/18/2021       Reactions   Codeine Anaphylaxis   Oxycontin [oxycodone Hcl] Itching   Through IV- has since had oral oxycodone without issue   Contrast Media [iodinated Contrast Media]    Patient got very nauseated. Gave $RemoveBeforeD'4mg'[id number]$  Zofran. Possibly pre medicate with Zofran    Morphine And Related Itching        Medication List     STOP taking these medications    omeprazole 40 MG capsule Commonly known as: PRILOSEC Replaced by: pantoprazole 40 MG tablet       TAKE these medications    acetaminophen 500 MG tablet Commonly known as: TYLENOL Take 500 mg by mouth every 6 (six) hours as  needed for mild pain.   albuterol 108 (90 Base) MCG/ACT inhaler Commonly known as: VENTOLIN HFA Inhale 1-2 puffs into the lungs every 6 (six) hours as needed for shortness of breath or wheezing.   aspirin EC 81 MG tablet Take 1 tablet (81 mg total) by mouth daily.   carvedilol 12.5 MG tablet Commonly known as: COREG Take 1 tablet (12.5 mg total) by mouth 2 (two) times daily with a meal.   clopidogrel 75 MG tablet Commonly known as: PLAVIX Take 1 tablet by mouth once daily   divalproex 500 MG DR tablet Commonly known as: DEPAKOTE Take 1 tablet (500 mg total) by mouth every 12 (twelve) hours.   gabapentin 300 MG capsule Commonly known as: NEURONTIN Take 1 capsule (300 mg total) by mouth 3 (three) times daily.   isosorbide mononitrate 30 MG 24 hr tablet Commonly known as: IMDUR Take 1 tablet (30 mg total) by mouth daily.   losartan 25 MG  tablet Commonly known as: COZAAR Take 25 mg by mouth daily.   nitroGLYCERIN 0.4 MG SL tablet Commonly known as: NITROSTAT Place 1 tablet (0.4 mg total) under the tongue every 5 (five) minutes as needed for chest pain.   nystatin powder Commonly known as: MYCOSTATIN/NYSTOP Apply 1 Application topically in the morning and at bedtime.   nystatin cream Commonly known as: MYCOSTATIN Apply 1 Application topically 2 (two) times daily.   oxyCODONE 15 MG immediate release tablet Commonly known as: ROXICODONE Take 15 mg by mouth every 6 (six) hours as needed for pain.   pantoprazole 40 MG tablet Commonly known as: PROTONIX Take 1 tablet (40 mg total) by mouth daily. Start taking on: November 19, 2021 Replaces: omeprazole 40 MG capsule   Restasis MultiDose 0.05 % ophthalmic emulsion Generic drug: cycloSPORINE Place 1 drop into both eyes 2 (two) times daily.   rosuvastatin 40 MG tablet Commonly known as: CRESTOR Take 40 mg by mouth at bedtime.   SYSTANE COMPLETE OP Place 1 drop into both eyes daily as needed (dry eyes).   Xiidra 5 % Soln Generic drug: Lifitegrast Place 1 drop into both eyes daily as needed (dry eye).           I called pt's pharmacy to ensure depakote DR in stock, they confirmed they have it. (Pt declined to use TOC pharmacy.)  Outstanding Labs/Studies   Recommend obtaining ammonia and valproic acid at upcoming visit 11/26/21  Duration of Discharge Encounter   Greater than 30 minutes including physician time.  Signed, Charlie Pitter, PA-C 11/18/2021, 11:39 AM

## 2021-11-18 NOTE — Telephone Encounter (Signed)
    Attention TOC pool,  This patient will need a TOC phone call after discharge. They are being discharged likely later today. Follow-up appointment has already been arranged with: Christen Bame 11/26/21 They are a patient of Jenkins Rouge, MD.  Thank you! Charlie Pitter, PA-C

## 2021-11-18 NOTE — Progress Notes (Addendum)
Progress Note  Patient Name: Jeremy [name] Date of Encounter: 11/18/2021  Primary Cardiologist: Jenkins Rouge, MD  Subjective   Feeling well, no complaints, no CP, SOB. Feels mentally clear and is A+Ox3 though does not recall being on seizure medicine several years ago as outlined in neuro note.  Inpatient Medications    Scheduled Meds:  aspirin EC  81 mg Oral Daily   Chlorhexidine Gluconate Cloth  6 each Topical Daily   clopidogrel  75 mg Oral Q breakfast   cycloSPORINE  1 drop Both Eyes BID   divalproex  500 mg Oral Q12H   gabapentin  300 mg Oral TID   isosorbide mononitrate  30 mg Oral Nightly   losartan  25 mg Oral Nightly   metoprolol succinate  12.5 mg Oral Nightly   nystatin  1 Application Topical BID   pantoprazole  40 mg Oral Daily   rosuvastatin  40 mg Oral Daily   Continuous Infusions:  sodium chloride     PRN Meds: sodium chloride, acetaminophen, albuterol, nitroGLYCERIN, ondansetron (ZOFRAN) IV, mouth rinse, oxyCODONE   Vital Signs    Vitals:   11/17/21 1332 11/17/21 1636 11/17/21 2007 11/18/21 0435  BP:  (!) 164/75 (!) 162/70 (!) 151/63  Pulse: 73 75 72 76  Resp: 16 16    Temp: 98.3 F (36.8 C) 98.1 F (36.7 C) 98.5 F (36.9 C) 97.7 F (36.5 C)  TempSrc: Oral Oral Oral Oral  SpO2: 97% 99% 98% 98%  Weight:      Height:        Intake/Output Summary (Last 24 hours) at 11/18/2021 0802 Last data filed at 11/17/2021 1200 Gross per 24 hour  Intake 980 ml  Output 425 ml  Net 555 ml      11/14/2021    1:51 AM 11/13/2021    7:42 PM 11/13/2021    3:33 PM  Last 3 Weights  Weight (lbs) 219 lb 4.8 oz 219 lb 14.4 oz 216 lb 11.4 oz  Weight (kg) 99.474 kg 99.746 kg 98.3 kg     Telemetry    NSR - Personally Reviewed  Physical Exam   GEN: No acute distress.  HEENT: Normocephalic, atraumatic, sclera non-icteric. Neck: No JVD or bruits. Cardiac: RRR no murmurs, rubs, or gallops.  Respiratory: Clear to auscultation bilaterally. Breathing is  unlabored. GI: Soft, nontender, non-distended, BS +x 4. MS: no deformity. Extremities: No clubbing or cyanosis. No edema. Distal pedal pulses are 2+ and equal bilaterally. Neuro:  AAOx3. Follows commands. Psych:  Responds to questions appropriately with a normal affect.  Labs    High Sensitivity Troponin:   Recent Labs  Lab 11/13/21 1503 11/13/21 1620 11/14/21 0725 11/15/21 2020  TROPONINIHS $RemoveBefo'10 8 6 'vRLRFrnXouk$ 6,227*      Cardiac EnzymesNo results for input(s): "TROPONINI" in the last 168 hours. No results for input(s): "TROPIPOC" in the last 168 hours.   Chemistry Recent Labs  Lab 11/15/21 1950 11/15/21 2020 11/15/21 2111 11/16/21 0253  NA 136 138 138 140  K 5.0 4.0 4.1 4.6  CL 107 102  --  106  CO2 22 24  --  23  GLUCOSE 85 94  --  91  BUN 18 16  --  16  CREATININE 1.50* 1.50*  --  1.62*  CALCIUM 8.5* 8.7*  --  8.7*  GFRNONAA 48* 48*  --  44*  ANIONGAP 7 12  --  11     Hematology Recent Labs  Lab 11/15/21 2020 11/15/21 2111 11/16/21 0253  11/17/21 0028  WBC 7.1  --  6.0 5.6  RBC 4.63  --  4.44 4.24  HGB 12.5* 11.2* 11.9* 11.7*  HCT 40.0 33.0* 40.4 35.9*  MCV 86.4  --  91.0 84.7  MCH 27.0  --  26.8 27.6  MCHC 31.3  --  29.5* 32.6  RDW 15.0  --  14.8 14.6  PLT 218  --  182 198    BNPNo results for input(s): "BNP", "PROBNP" in the last 168 hours.   DDimer  Recent Labs  Lab 11/13/21 1622  DDIMER 0.42     Radiology    VAS US CAROTID  Result Date: 11/16/2021 Carotid Arterial Duplex Study Patient Name:  Jeremy [name]  Date of Exam:   11/16/2021 Medical Rec #: [id number]       Accession #:    [id number] Date of Birth: [birth year]       Patient Gender: M Patient Age:   76 years Exam Location:  [facility] Procedure:      VAS US CAROTID Referring Phys: Charlene Brooke --------------------------------------------------------------------------------  Indications:       Right endarterectomy. Risk Factors:      Hypertension, hyperlipidemia, past history of  smoking,                    coronary artery disease. Comparison Study:  02/10/2021 Carotid artery duplex- 1-39% ICA stenosis                    bilaterally. Performing Technologist: Maudry Mayhew MHA, RDMS, RVT, RDCS  Examination Guidelines: A complete evaluation includes B-mode imaging, spectral Doppler, color Doppler, and power Doppler as needed of all accessible portions of each vessel. Bilateral testing is considered an integral part of a complete examination. Limited examinations for reoccurring indications may be performed as noted.  Right Carotid Findings: +----------+--------+--------+--------+------------------+------------------+           PSV cm/sEDV cm/sStenosisPlaque DescriptionComments           +----------+--------+--------+--------+------------------+------------------+ CCA Prox  86      12                                intimal thickening +----------+--------+--------+--------+------------------+------------------+ CCA Distal71      12                                intimal thickening +----------+--------+--------+--------+------------------+------------------+ ICA Prox  41      11      1-39%                                        +----------+--------+--------+--------+------------------+------------------+ ICA Distal140     36                                                   +----------+--------+--------+--------+------------------+------------------+ ECA       106     15                                                   +----------+--------+--------+--------+------------------+------------------+ +----------+--------+-------+----------------+-------------------+  PSV cm/sEDV cmsDescribe        Arm Pressure (mmHG) +----------+--------+-------+----------------+-------------------+ [id number]            Multiphasic, WNL                    +----------+--------+-------+----------------+-------------------+  +---------+--------+--+--------+--+---------+ VertebralPSV cm/s83EDV cm/s20Antegrade +---------+--------+--+--------+--+---------+  Left Carotid Findings: +----------+--------+-------+--------+----------------------+------------------+           PSV cm/sEDV    StenosisPlaque Description    Comments                             cm/s                                                    +----------+--------+-------+--------+----------------------+------------------+ CCA Prox  88      22                                   intimal thickening +----------+--------+-------+--------+----------------------+------------------+ CCA Distal63      16                                   intimal thickening +----------+--------+-------+--------+----------------------+------------------+ ICA Prox  117     36     1-39%   heterogenous and                                                          smooth                                   +----------+--------+-------+--------+----------------------+------------------+ ICA Mid   138     38                                                      +----------+--------+-------+--------+----------------------+------------------+ ICA Distal128     37                                                      +----------+--------+-------+--------+----------------------+------------------+ ECA       123     10                                                      +----------+--------+-------+--------+----------------------+------------------+ +----------+--------+--------+----------------+-------------------+           PSV cm/sEDV cm/sDescribe        Arm Pressure (mmHG) +----------+--------+--------+----------------+-------------------+ [id number]             Multiphasic, WNL                    +----------+--------+--------+----------------+-------------------+ +---------+--------+--+--------+--+  VertebralPSV cm/s70EDV  cm/s12 +---------+--------+--+--------+--+   Summary: Right Carotid: Velocities in the right ICA are consistent with a 1-39% stenosis. Left Carotid: Velocities in the left ICA are consistent with a 1-39% stenosis. Vertebrals:  Bilateral vertebral arteries demonstrate antegrade flow. Subclavians: Normal flow hemodynamics were seen in bilateral subclavian              arteries. *See table(s) above for measurements and observations.  Electronically signed by Harold Barban MD on 11/16/2021 at 10:47:10 PM.    Final    MR BRAIN WO CONTRAST  Result Date: 11/16/2021 CLINICAL DATA:  Neuro deficit, acute, stroke suspected. EXAM: MRI HEAD WITHOUT CONTRAST TECHNIQUE: Multiplanar, multiecho pulse sequences of the brain and surrounding structures were obtained without intravenous contrast. COMPARISON:  11/15/2021 CT.  06/20/2018 MRI. FINDINGS: Brain: Small grouping of acute infarctions within the inferior cerebellum on the right. Acute infarction affecting the posteromedial temporal lobe and occipital lobe on the right, PCA territory. Mild swelling but no blood products visible in that area. Punctate acute infarction along the cortical surface in the right frontoparietal junction region. Elsewhere, there chronic small-vessel ischemic changes of the pons. Old small vessel cerebellar infarctions are present. There is chronic infarction of the right posterolateral temporal lobe and there are mild chronic small-vessel ischemic changes of the cerebral hemispheric white matter. No hydrocephalus. No extra-axial collection. Vascular: Major vessels at the base of the brain show flow. Skull and upper cervical spine: Negative Sinuses/Orbits: Clear/normal Other: None IMPRESSION: Acute infarction in the right PCA territory. Mild swelling but no evidence of hemorrhage or mass effect. Small cluster of acute infarctions within the right cerebellar hemisphere. Single punctate acute infarction along the cortical surface of the right  frontoparietal junction near the vertex. Old infarction of the right lateral temporal lobe. Old small vessel insults elsewhere within the cerebellum, brainstem and cerebral hemispheric white matter. Electronically Signed   By: Nelson Chimes M.D.   On: 11/16/2021 14:50    Cardiac Studies   Carotid study 11/16/2021  Summary:  Right Carotid: Velocities in the right ICA are consistent with a 1-39%  stenosis.   Left Carotid: Velocities in the left ICA are consistent with a 1-39%  stenosis.   Vertebrals:  Bilateral vertebral arteries demonstrate antegrade flow.  Subclavians: Normal flow hemodynamics were seen in bilateral subclavian               arteries.   *See table(s) above for measurements and observations.   Cath 11/15/21   Prox Cx to Mid Cx lesion is 20% stenosed.   Dist Cx lesion is 70% stenosed.   2nd Diag lesion is 80% stenosed.   2nd Mrg lesion is 60% stenosed.   RPDA lesion is 50% stenosed.   3rd Mrg lesion is 90% stenosed.   Dist RCA lesion is 90% stenosed.   Prox RCA to Mid RCA lesion is 80% stenosed.   Non-stenotic Prox LAD lesion was previously treated.   A stent was successfully placed.   A drug-eluting stent was successfully placed using a SYNERGY XD 2.50X32.   Post intervention, there is a 0% residual stenosis.   Post intervention, there is a 0% residual stenosis.   PROCEDURE DESCRIPTION:    The patient was brought to the second floor [facility] cath lab in the postabsorptive state. He was premedicated with IV Versed and fentanyl. His right wrist was prepped and shaved in usual sterile fashion. Xylocaine 1% was used for local anesthesia. A 6 French sheath was inserted into  the right radial artery using standard Seldinger technique. The patient received 5000 units  of heparin intravenously.  A 5 [place] TIG catheter and right Judkins catheters were used for selective coronary and angiography and obtain left heart pressures.  Isovue dye is used for the entirety of  the case (180 cc contrast total to patient).  Retrograde ordered, left ventricular and pullback pressures were recorded (LVEDP was 25).  Radial cocktail was administered via the SideArm sheath.  The patient received an additional 300 mg of clopidogrel p.o. in addition to Pepcid 20 mg IV.  He was on aspirin and clopidogrel at home but said he may have missed an occasional dose.  He received an additional 5000's of heparin with an ACT of 323.  Isovue dye is used for the entirety of the intervention.  Retroaortic pressures monitored in the case.  Using a 6 [place] AL-1 with sideholes along with 0.14 P[place]ater guidewire I was able to cross the proximal mid and distal RCA disease segments with little difficulty.  I postdilated all disease seconds with a 2.5 mm x 12 mm long balloon.  I then placed a 3 mm x 12 mm long Synergy drug-eluting stent in the distal lesion.  I overlapped this because of "hazy appearance in the proximal edge of the stent" with a 3 oh 16 Synergy deployed at 14 to 16 atm.  I then placed a 2.5 x 32 mm long Synergy drug-eluting stent across the entire midportion of the RCA deployed at 14 to 16 atm.  I postdilated with a 3 mm x 15 mm long noncompliant balloon up to 14 to 16 atm resulting in reduction of a long 80% stenosis to 0% residual.  I did lose 2 small acute marginal branches.  With deployment of the distal stents the patient became bradycardic and hypotensive requiring intravenous atropine (3 mg) as well as Levophed.  Ultimately his blood pressure and heart rate stabilized.  He was complaining of chest pain at the end of the case.  His postprocedure EKG was unchanged with left bundle branch block.  He was hemodynamically stable.    IMPRESSION: Mr. Oren proximal LAD and mid AV groove circumflex stents were widely patent.  He did have a 90% stenosis and a small third marginal branch.  The entire mid RCA had progressed and was diffusely diseased in the 80% range and he had a fairly focal  90% stenosis in the distal RCA as well as a diffusely diseased PDA.  I reviewed the angiograms with Dr. Burt Knack and we both agreed that the best option was stenting of the distal RCA and the entire mid RCA.  This was performed placing 2 overlapping stents in the distal RCA and along DES stent in the mid RCA which was postdilated to 3 mm.  The patient still is complaining of pain.  Because of this I have elected to place him in unit to H for closer observation.  He did have TIMI-3 flow at the end of the case.   Quay Burow. MD, [facility] 11/15/2021 11:05 AM  2D echo 11/14/21   1. Left ventricular ejection fraction, by estimation, is 60 to 65%. The  left ventricle has normal function. The left ventricle has no regional  wall motion abnormalities. There is mild left ventricular hypertrophy.  Left ventricular diastolic parameters  are consistent with Grade I diastolic dysfunction (impaired relaxation).  The average left ventricular global longitudinal strain is -13.3 %. The  global longitudinal strain is abnormal.   2.  Right ventricular systolic function is normal. The right ventricular  size is normal.   3. The mitral valve is normal in structure. Trivial mitral valve  regurgitation. No evidence of mitral stenosis.   4. The aortic valve is tricuspid. Aortic valve regurgitation is not  visualized. No aortic stenosis is present.   5. The inferior vena cava is normal in size with greater than 50%  respiratory variability, suggesting right atrial pressure of 3 mmHg.   Comparison(s): No significant change from prior study. Prior images  reviewed side by side. 08/23/20-EF 60-65%.     Patient Profile     76 y.o. male with CAD (remote stent to RCA, then PTCA to Cx and DES to pLAD in 03/2019 with residual disease treated medically), recovered cardiomyopathy (EF 35-40% in 05/2018 at time of stroke then normal since then), CKD stage 3a-b by labs, mild aortic dilation (normal by echo this admission), HTN,  HLD, PVD with history of CVA and right CEA 2019 by Dr. Donnetta Hutching, prior seizure in 2020, LBBB, first degree AVB who presented to [facility] with chest pain concerning for [place]. Post-cath course complicated by AMS/stroke.  Assessment & Plan    1. CAD with unstable angina - initial admit troponins negative - 2D echo 11/14/21 EF 60-65%, 1 DD, trivial MR - cath 11/15/21 with findings above with patent LAD and Cx stents, ultimately received 2 overlapping stents to the RCA in the distal RCA and along DES in the mid RCA  - f/u troponin 6/26 was 6227, 1 value, will review with MD to ensure no further f/u necessary from lab standpoint -> pt asymptomatic from cardiac standpoint today  2. Altered mental status/stroke (with prior hx of stroke and seizure) - per neuro, right PCA, cerebellar and punctate frontoparietal infarcts embolic likely related to cardiac cath procedure.  A-fib cannot be completed without but not reported from telemetry monitoring - carotid duplex 1-39% BICA - echo with normal EF this admission - continue DAPT - will review ?event monitoring with MD - re: prior history of seizure, was previously off Dilantin, Keppra and Vimpat due to cognitive side effects -> loaded with Depakote this admission - I reviewed lab plans with Dr. Erlinda Hong since pt is otherwise likely stable for DC - he feels the patient would not necessarily need to stay for day 3 ammonia/VPA levels but that this can be followed up as outpatient - would aim to obtain in AM at [facility] appt in 1 week, will see if I can coordinate with cardmaster - neuro recommends to continue f/u with Dr. Leonie Man at [facility]  3. Essential HTN - BP variable, 1teens-160s, will review with MD - current rx Imdur 30mg  nightly, losartan 25mg  nightly, Toprol 12.5mg  nightly (in lieu of home carvedilol)  4. Mixed hyperlipidemia - lipid panel with trig 164, HDL 32, LDL 59 -> continue rosuvastatin -  update LFTs with labs  5. Carotid artery disease s/p R CEA -  carotid duplex this admission with 1-39% BICA  6. LBBB/first degree AVB - chronic for patient - had bradycardia/hypotension with deployment of stents but otherwise telemetry stable since then  7. CKD stage 3a-b - pre-cath Cr around 1.5 range, last BMET 6/27 with Cr 1.62 - will recheck labs today in prep for potential DC  For questions or updates, please contact [place] Please consult [email or web address] for contact info under Cardiology/STEMI.  Signed, Charlie Pitter, PA-C 11/18/2021, 8:02 AM    Patient seen, examined. Available data reviewed. Agree with findings,  assessment, and plan as outlined by Melina Copa, PA-C.  The patient is independently interviewed and examined.  He is alert and oriented, no distress.  HEENT is normal, lungs are clear, heart is regular rate and rhythm no murmur gallop, abdomen soft nontender, extremities have no edema.  The patient is made great progress over the last 48 hours.  He is medically stable for hospital discharge today.  Pertinent issues outlined above as follows: Do not need to repeat troponin as patient without recurrent ischemic symptoms after periprocedural MI. Regarding antihypertensive medications, would change low-dose metoprolol succinate back to his home carvedilol 12.5 mg twice daily.  Continue isosorbide and losartan at current doses.  Further med titration and outpatient follow-up. Medically stable for discharge today.  Vital signs, medications, neurology notes, all reviewed and agree with plans as outlined above.  Sherren Mocha, M.D. 11/18/2021 11:14 AM

## 2021-11-19 NOTE — Telephone Encounter (Addendum)
**Note De-Identified  Obfuscation** Transition Care Management Unsuccessful Follow-up Telephone Call  Date of discharge and from where:  11/18/2021 from [facility]  Attempts:  1st Attempt  Reason for unsuccessful TCM follow-up call:  I have called the pts home phone number several times this morning but have gotten a busy signal each time so I Left voice message on the pts Cell phone VM asking him to call us back at [facility] at [phone] concerning his hospital stay/discharge.

## 2021-11-25 NOTE — Telephone Encounter (Signed)
**Note De-Identified  Obfuscation** Patient's wife and DPR, [name] [facility].  [name] understands that the pt has a hosp follow up scheduled with provider Christen Bame, NP on 11/26/2021 at 8:25 at [address] [place] in [place], [place] [address]. Patient understands discharge instructions? Yes Patient understands medications and regiment? Yes Patient understands to bring all medications to this visit? Yes  Ask patient:  Are you enrolled in My Chart: Yes  The pts wife and DPR, [name] states that the pt has been tired but otherwise he has been doing well since he returned home from the hospital. She states that he has had no c/o chest pain/discomfort, SOB, nausea, diaphoresis, vomiting, dizziness, or lightheadedness. [name] also asked the pt while we were on this call and he did deny having any of the above symptoms.  She verified that they do have [facility]'s phone number to call if they have any questions or concerns. She thanked me for my call.

## 2021-11-26 ENCOUNTER — Ambulatory Visit (INDEPENDENT_AMBULATORY_CARE_PROVIDER_SITE_OTHER): Payer: PPO | Admitting: Nurse Practitioner

## 2021-11-26 ENCOUNTER — Other Ambulatory Visit: Payer: Self-pay | Admitting: *Deleted

## 2021-11-26 ENCOUNTER — Encounter: Payer: Self-pay | Admitting: Nurse Practitioner

## 2021-11-26 VITALS — BP 180/70 | HR 64 | Ht 67.0 in | Wt 217.4 lb

## 2021-11-26 DIAGNOSIS — I63431 Cerebral infarction due to embolism of right posterior cerebral artery: Secondary | ICD-10-CM

## 2021-11-26 DIAGNOSIS — Z79899 Other long term (current) drug therapy: Secondary | ICD-10-CM

## 2021-11-26 DIAGNOSIS — I440 Atrioventricular block, first degree: Secondary | ICD-10-CM

## 2021-11-26 DIAGNOSIS — Z9889 Other specified postprocedural states: Secondary | ICD-10-CM | POA: Diagnosis not present

## 2021-11-26 DIAGNOSIS — I6523 Occlusion and stenosis of bilateral carotid arteries: Secondary | ICD-10-CM

## 2021-11-26 DIAGNOSIS — E785 Hyperlipidemia, unspecified: Secondary | ICD-10-CM

## 2021-11-26 DIAGNOSIS — I2510 Atherosclerotic heart disease of native coronary artery without angina pectoris: Secondary | ICD-10-CM | POA: Diagnosis not present

## 2021-11-26 MED ORDER — LOSARTAN POTASSIUM 50 MG PO TABS: 50.0000 mg | ORAL_TABLET | Freq: Every day | ORAL | 3 refills | Status: DC

## 2021-11-26 NOTE — Patient Instructions (Signed)
Medication Instructions:   INCREASE Losartan one (1) tablet by mouth ( 50 mg ) daily.   *If you need a refill on your cardiac medications before your next appointment, please call your pharmacy*   Lab Work:  TODAY!!!!!!! BMET/CBC/AMMONIA/VALPROIC ACID  Your physician recommends that you return for lab work in [place] on July 17 at any [facility] or at [facility].    If you have labs (blood work) drawn today and your tests are completely normal, you will receive your results only by: [place] (if you have MyChart) OR A paper copy in the mail If you have any lab test that is abnormal or we need to change your treatment, we will call you to review the results.   Testing/Procedures:   None ordered.   Follow-Up: At [facility], you and your health needs are our priority.  As part of our continuing mission to provide you with exceptional heart care, we have created designated Provider Care Teams.  These Care Teams include your primary Cardiologist (physician) and Advanced Practice Providers (APPs -  Physician Assistants and Nurse Practitioners) who all work together to provide you with the care you need, when you need it.  We recommend signing up for the patient portal called "MyChart".  Sign up information is provided on this After Visit Summary.  MyChart is used to connect with patients for Virtual Visits (Telemedicine).  Patients are able to view lab/test results, encounter notes, upcoming appointments, etc.  Non-urgent messages can be sent to your provider as well.   To learn more about what you can do with MyChart, go to [email or web address].    Your next appointment:   3 month(s)  The format for your next appointment:   In Person  Provider:   Jenkins Rouge, MD    Other Instructions  HOW TO TAKE YOUR BLOOD PRESSURE: Rest 5 minutes before taking your blood pressure.  Don't smoke or drink caffeinated beverages for at least 30 minutes before. Take your blood  pressure before (not after) you eat. Sit comfortably with your back supported and both feet on the floor (don't cross your legs). Elevate your arm to heart level on a table or a desk. Use the proper sized cuff. It should fit smoothly and snugly around your bare upper arm. There should be enough room to slip a fingertip under the cuff. The bottom edge of the cuff should be 1 inch above the crease of the elbow.  Please take your blood pressure and if it consistently stays above 140/80 X 3 please call office at [phone] or send mychart message.    Important Information About Sugar

## 2021-11-27 LAB — CBC
Hematocrit: 40.9 % (ref 37.5–51.0)
Hemoglobin: 13.2 g/dL (ref 13.0–17.7)
MCH: 27.6 pg (ref 26.6–33.0)
MCHC: 32.3 g/dL (ref 31.5–35.7)
MCV: 86 fL (ref 79–97)
Platelets: 197 10*3/uL (ref 150–450)
RBC: 4.78 x10E6/uL (ref 4.14–5.80)
RDW: 14.9 % (ref 11.6–15.4)
WBC: 6.3 10*3/uL (ref 3.4–10.8)

## 2021-11-27 LAB — BASIC METABOLIC PANEL
BUN/Creatinine Ratio: 18 (ref 10–24)
BUN: 24 mg/dL (ref 8–27)
CO2: 25 mmol/L (ref 20–29)
Calcium: 9.8 mg/dL (ref 8.6–10.2)
Chloride: 104 mmol/L (ref 96–106)
Creatinine, Ser: 1.33 mg/dL — ABNORMAL HIGH (ref 0.76–1.27)
Glucose: 97 mg/dL (ref 70–99)
Potassium: 4.9 mmol/L (ref 3.5–5.2)
Sodium: 143 mmol/L (ref 134–144)
eGFR: 56 mL/min/{1.73_m2} — ABNORMAL LOW (ref >59)

## 2021-11-27 LAB — AMMONIA: Ammonia: 47 ug/dL (ref 31–169)

## 2021-11-27 LAB — VALPROIC ACID LEVEL: Valproic Acid Lvl: 39 ug/mL — ABNORMAL LOW (ref 50–100)

## 2021-12-02 DIAGNOSIS — H43811 Vitreous degeneration, right eye: Secondary | ICD-10-CM | POA: Diagnosis not present

## 2021-12-02 DIAGNOSIS — H04223 Epiphora due to insufficient drainage, bilateral lacrimal glands: Secondary | ICD-10-CM | POA: Diagnosis not present

## 2021-12-02 DIAGNOSIS — H401121 Primary open-angle glaucoma, left eye, mild stage: Secondary | ICD-10-CM | POA: Diagnosis not present

## 2021-12-02 DIAGNOSIS — H40021 Open angle with borderline findings, high risk, right eye: Secondary | ICD-10-CM | POA: Diagnosis not present

## 2021-12-06 ENCOUNTER — Other Ambulatory Visit (HOSPITAL_COMMUNITY)
Admission: RE | Admit: 2021-12-06 | Discharge: 2021-12-06 | Disposition: A | Discharge status: Home / Self Care | Payer: PPO | Source: Ambulatory Visit | Attending: Nurse Practitioner | Admitting: Nurse Practitioner

## 2021-12-06 ENCOUNTER — Other Ambulatory Visit: Payer: PPO

## 2021-12-06 DIAGNOSIS — Z79899 Other long term (current) drug therapy: Secondary | ICD-10-CM | POA: Diagnosis not present

## 2021-12-06 LAB — BASIC METABOLIC PANEL
Anion gap: 6 (ref 5–15)
BUN: 23 mg/dL (ref 8–23)
CO2: 29 mmol/L (ref 22–32)
Calcium: 8.9 mg/dL (ref 8.9–10.3)
Chloride: 107 mmol/L (ref 98–111)
Creatinine, Ser: 1.39 mg/dL — ABNORMAL HIGH (ref 0.61–1.24)
GFR, Estimated: 53 mL/min — ABNORMAL LOW (ref >60)
Glucose, Bld: 101 mg/dL — ABNORMAL HIGH (ref 70–99)
Potassium: 4.6 mmol/L (ref 3.5–5.1)
Sodium: 142 mmol/L (ref 135–145)

## 2021-12-20 DIAGNOSIS — I129 Hypertensive chronic kidney disease with stage 1 through stage 4 chronic kidney disease, or unspecified chronic kidney disease: Secondary | ICD-10-CM | POA: Diagnosis not present

## 2021-12-20 DIAGNOSIS — E1122 Type 2 diabetes mellitus with diabetic chronic kidney disease: Secondary | ICD-10-CM | POA: Diagnosis not present

## 2021-12-20 DIAGNOSIS — E782 Mixed hyperlipidemia: Secondary | ICD-10-CM | POA: Diagnosis not present

## 2021-12-20 DIAGNOSIS — N1831 Chronic kidney disease, stage 3a: Secondary | ICD-10-CM | POA: Diagnosis not present

## 2021-12-28 DIAGNOSIS — E1122 Type 2 diabetes mellitus with diabetic chronic kidney disease: Secondary | ICD-10-CM | POA: Diagnosis not present

## 2021-12-28 DIAGNOSIS — E782 Mixed hyperlipidemia: Secondary | ICD-10-CM | POA: Diagnosis not present

## 2022-01-02 DIAGNOSIS — G8929 Other chronic pain: Secondary | ICD-10-CM | POA: Insufficient documentation

## 2022-01-04 DIAGNOSIS — E6609 Other obesity due to excess calories: Secondary | ICD-10-CM | POA: Diagnosis not present

## 2022-01-04 DIAGNOSIS — I639 Cerebral infarction, unspecified: Secondary | ICD-10-CM | POA: Diagnosis not present

## 2022-01-04 DIAGNOSIS — G629 Polyneuropathy, unspecified: Secondary | ICD-10-CM | POA: Diagnosis not present

## 2022-01-04 DIAGNOSIS — I129 Hypertensive chronic kidney disease with stage 1 through stage 4 chronic kidney disease, or unspecified chronic kidney disease: Secondary | ICD-10-CM | POA: Diagnosis not present

## 2022-01-04 DIAGNOSIS — E782 Mixed hyperlipidemia: Secondary | ICD-10-CM | POA: Diagnosis not present

## 2022-01-04 DIAGNOSIS — I2510 Atherosclerotic heart disease of native coronary artery without angina pectoris: Secondary | ICD-10-CM | POA: Diagnosis not present

## 2022-01-04 DIAGNOSIS — Z6837 Body mass index (BMI) 37.0-37.9, adult: Secondary | ICD-10-CM | POA: Diagnosis not present

## 2022-01-04 DIAGNOSIS — N1831 Chronic kidney disease, stage 3a: Secondary | ICD-10-CM | POA: Diagnosis not present

## 2022-01-04 DIAGNOSIS — G8929 Other chronic pain: Secondary | ICD-10-CM | POA: Diagnosis not present

## 2022-01-04 NOTE — Progress Notes (Unsigned)
[facility] [address] [place]. [place] [address] [phone]       HOSPITAL FOLLOW UP NOTE  Jeremy [name] Date of Birth:  07-May-1946 Medical Record Number:  [id number]   Reason for Referral:  hospital stroke follow up    SUBJECTIVE:   CHIEF COMPLAINT:  No chief complaint on file.   HPI:   Jeremy [name] is a 76 y.o. male with history of right CEA 2019, CAD, hypertension, hyperlipidemia, history of seizure and stroke 05/2018 who was initially admitted on 11/13/2021 for intermittent chest pain and subsequently underwent cardiac cath and stenting.  Post cardiac cath on 6/26 patient was found to have confusion, difficulty speaking, somnolence and mild left arm drift.  Neurology was consulted with MRI showing right PCA, cerebellar and punctate frontal parietal infarcts embolic likely related to cardiac cath procedure below unable to completely rule out A-fib although not seen on telemetry, Dr. Burt Knack did not feel monitor warranted at this time.  Carotid Doppler unremarkable.  EF 60 to 65%.  EEG moderate diffuse encephalopathy.  LDL 59.  A1c 5.4.  On aspirin and Plavix per cardiology recommendations as well as Crestor 40 mg daily.  Prior history of seizure in 05/2018 but discontinued AEDs d/t cognitive side effects including Dilantin, Keppra and Vimpat at prior visit with Dr. Leonie Man in 07/2018.  Neurology recommended restarting Depakote for prior history of seizure and mood stabilizing benefits.  Also noted low B12 levels at 192 and elevated ammonia at 41.  Recommended repeat labs at follow-up visits.  Residual deficits of left upper quadrantanopsia and advised no driving until neurology f/u. He was urged home on 6/29.        PERTINENT IMAGING  Per hospitalization 11/15/2021 CT head no acute finding CT head and neck limited, nondiagnostic MRI right PCA, right cerebellum and punctate right frontal parietal infarcts Carotid Doppler unremarkable 2D Echo EF 60  to 65%  EEG moderate diffuse encephalopathy LDL 59 HgbA1c 5.4    ROS:   14 system review of systems performed and negative with exception of ***  PMH:  Past Medical History:  Diagnosis Date   Arthritis    Basal cell carcinoma 02/11/2021   nod- left dorsal hand (EXC)   CAD (coronary artery disease)    a. s/p prior stenting of LAD and RCA b. 03/2019: NSTEMI and required PTCA/DESx1 to the LAD and successful scoring balloon angioplasty in the previously stented segment of LCx   Carotid stenosis, asymptomatic, right    Carpal tunnel syndrome    bilateral   Coronary artery disease    GERD (gastroesophageal reflux disease)    History of kidney stones    Hx of colonic polyp    Hypercholesterolemia    Hypertension    Myocardial infarction ([place])    hx of   Obesity    SCC (squamous cell carcinoma) 02/11/2021   in situ- right temple (CX35FU)   Squamous cell carcinoma of skin 08/09/2016   well differentiated on right outer eye - tx p bx   Stroke ([place])     PSH:  Past Surgical History:  Procedure Laterality Date   CARDIAC CATHETERIZATION     CATARACT EXTRACTION W/PHACO Left 03/12/2021   Procedure: CATARACT EXTRACTION PHACO AND INTRAOCULAR LENS PLACEMENT ([place]);  Surgeon: Baruch Goldmann, MD;  Location: [facility];  Service: Ophthalmology;  Laterality: Left;   CDE: 14.58   CATARACT EXTRACTION W/PHACO Right 04/08/2021   Procedure: CATARACT EXTRACTION PHACO AND INTRAOCULAR LENS PLACEMENT RIGHT EYE;  Surgeon: Baruch Goldmann, MD;  Location: [facility];  Service: Ophthalmology;  Laterality: Right;  CDE 10.94   CORONARY BALLOON ANGIOPLASTY N/A 03/26/2019   Procedure: CORONARY BALLOON ANGIOPLASTY;  Surgeon: Burnell Blanks, MD;  Location: [place] [facility];  Service: Cardiovascular;  Laterality: N/A;   CORONARY STENT INTERVENTION N/A 03/26/2019   Procedure: CORONARY STENT INTERVENTION;  Surgeon: Burnell Blanks, MD;  Location: [place] [facility];  Service: Cardiovascular;   Laterality: N/A;   CORONARY STENT INTERVENTION N/A 11/15/2021   Procedure: CORONARY STENT INTERVENTION;  Surgeon: Lorretta Harp, MD;  Location: [place] [facility];  Service: Cardiovascular;  Laterality: N/A;   CORONARY STENT PLACEMENT  2000   x3    CYST EXCISION     x 2   ENDARTERECTOMY Right 12/25/2017   Procedure: ENDARTERECTOMY CAROTID RIGHT;  Surgeon: Rosetta Posner, MD;  Location: [place];  Service: Vascular;  Laterality: Right;   LEFT HEART CATH AND CORONARY ANGIOGRAPHY N/A 03/26/2019   Procedure: LEFT HEART CATH AND CORONARY ANGIOGRAPHY;  Surgeon: Burnell Blanks, MD;  Location: [place] [facility];  Service: Cardiovascular;  Laterality: N/A;   LEFT HEART CATH AND CORONARY ANGIOGRAPHY N/A 11/15/2021   Procedure: LEFT HEART CATH AND CORONARY ANGIOGRAPHY;  Surgeon: Lorretta Harp, MD;  Location: [place];  Service: Cardiovascular;  Laterality: N/A;   PATCH ANGIOPLASTY Right 12/25/2017   Procedure: PATCH ANGIOPLASTY RIGHT CAROTID ARTERY;  Surgeon: Rosetta Posner, MD;  Location: [facility];  Service: Vascular;  Laterality: Right;   TEAR DUCT PROBING Bilateral 07/2020   done at [place]. Dr. Norlene Duel   TEE WITHOUT CARDIOVERSION N/A 06/29/2018   Procedure: TRANSESOPHAGEAL ECHOCARDIOGRAM (TEE);  Surgeon: Fay Records, MD;  Location: [facility];  Service: Cardiovascular;  Laterality: N/A;    Social History:  Social History   Socioeconomic History   Marital status: Divorced    Spouse name: Not on file   Number of children: Not on file   Years of education: Not on file   Highest education level: Not on file  Occupational History   Occupation: retired    Comment: welder  Tobacco Use   Smoking status: Former    Packs/day: 2.00    Years: 20.00    Total pack years: 40.00    Types: Cigarettes    Quit date: 05/23/1988    Years since quitting: 33.6   Smokeless tobacco: Never   Tobacco comments:    smoked 2 packs per day for 20 years  Vaping Use   Vaping Use: Never used   Substance and Sexual Activity   Alcohol use: No   Drug use: No   Sexual activity: Yes  Other Topics Concern   Not on file  Social History Narrative   Not on file   Social Determinants of Health   Financial Resource Strain: Not on file  Food Insecurity: Not on file  Transportation Needs: Not on file  Physical Activity: Not on file  Stress: Not on file  Social Connections: Not on file  Intimate Partner Violence: Not on file    Family History:  Family History  Problem Relation Age of Onset   Other Father 18       deceased old age   Coronary artery disease Mother 7       deceased   Hypertension Mother    Other Sister 33       living and healthy   Coronary artery disease Brother    Skin  cancer Brother    Colon cancer Neg Hx    Pancreatic cancer Neg Hx    Stomach cancer Neg Hx    Esophageal cancer Neg Hx    Liver disease Neg Hx     Medications:   Current Outpatient Medications on File Prior to Visit  Medication Sig Dispense Refill   acetaminophen (TYLENOL) 500 MG tablet Take 500 mg by mouth every 6 (six) hours as needed for mild pain.     albuterol (VENTOLIN HFA) 108 (90 Base) MCG/ACT inhaler Inhale 1-2 puffs into the lungs every 6 (six) hours as needed for shortness of breath or wheezing.     aspirin EC 81 MG EC tablet Take 1 tablet (81 mg total) by mouth daily. 90 tablet 3   carvedilol (COREG) 12.5 MG tablet Take 1 tablet (12.5 mg total) by mouth 2 (two) times daily with a meal. 60 tablet 0   clopidogrel (PLAVIX) 75 MG tablet Take 1 tablet by mouth once daily 90 tablet 3   divalproex (DEPAKOTE) 500 MG DR tablet Take 1 tablet (500 mg total) by mouth every 12 (twelve) hours. 60 tablet 1   gabapentin (NEURONTIN) 300 MG capsule Take 1 capsule (300 mg total) by mouth 3 (three) times daily. 90 capsule 6   isosorbide mononitrate (IMDUR) 30 MG 24 hr tablet Take 1 tablet (30 mg total) by mouth daily. 90 tablet 3   losartan (COZAAR) 50 MG tablet Take 1 tablet (50 mg total) by  mouth daily. 90 tablet 3   nitroGLYCERIN (NITROSTAT) 0.4 MG SL tablet Place 1 tablet (0.4 mg total) under the tongue every 5 (five) minutes as needed for chest pain. 25 tablet 3   nystatin (MYCOSTATIN/NYSTOP) powder Apply 1 Application topically in the morning and at bedtime.     nystatin cream (MYCOSTATIN) Apply 1 Application topically 2 (two) times daily.     oxyCODONE (ROXICODONE) 15 MG immediate release tablet Take 15 mg by mouth every 6 (six) hours as needed for pain.     pantoprazole (PROTONIX) 40 MG tablet Take 1 tablet (40 mg total) by mouth daily. 30 tablet 1   Propylene Glycol (SYSTANE COMPLETE OP) Place 1 drop into both eyes daily as needed (dry eyes).     RESTASIS MULTIDOSE 0.05 % ophthalmic emulsion Place 1 drop into both eyes 2 (two) times daily.     rosuvastatin (CRESTOR) 40 MG tablet Take 40 mg by mouth at bedtime.     XIIDRA 5 % SOLN Place 1 drop into both eyes daily as needed (dry eye).     No current facility-administered medications on file prior to visit.    Allergies:   Allergies  Allergen Reactions   Codeine Anaphylaxis   Oxycontin [Oxycodone Hcl] Itching    Through IV- has since had oral oxycodone without issue   Contrast Media [Iodinated Contrast Media]     Patient got very nauseated. Gave '4mg'$  Zofran. Possibly pre medicate with Zofran    Morphine And Related Itching      OBJECTIVE:  Physical Exam  There were no vitals filed for this visit. There is no height or weight on file to calculate BMI. No results found.      No data to display           General: well developed, well nourished, seated, in no evident distress Head: head normocephalic and atraumatic.   Neck: supple with no carotid or supraclavicular bruits Cardiovascular: regular rate and rhythm, no murmurs Musculoskeletal: no deformity Skin:  no  rash/petichiae Vascular:  Normal pulses all extremities   Neurologic Exam Mental Status: Awake and fully alert. Oriented to place and time.  Recent and remote memory intact. Attention span, concentration and fund of knowledge appropriate. Mood and affect appropriate.  Cranial Nerves: Fundoscopic exam reveals sharp disc margins. Pupils equal, briskly reactive to light. Extraocular movements full without nystagmus. Visual fields full to confrontation. Hearing intact. Facial sensation intact. Face, tongue, palate moves normally and symmetrically.  Motor: Normal bulk and tone. Normal strength in all tested extremity muscles Sensory.: intact to touch , pinprick , position and vibratory sensation.  Coordination: Rapid alternating movements normal in all extremities. Finger-to-nose and heel-to-shin performed accurately bilaterally. Gait and Station: Arises from chair without difficulty. Stance is normal. Gait demonstrates normal stride length and balance with ***. Tandem walk and heel toe ***.  Reflexes: 1+ and symmetric. Toes downgoing.     NIHSS  *** Modified Rankin  ***      ASSESSMENT: Jeremy [name] is a 76 y.o. year old male with right PCA, cerebellar and punctate frontoparietal infarcts on 11/15/2021 likely related to cardiac cath procedure. Vascular risk factors include history of right CEA, history of seizure and stroke 2020, HTN, HLD, B12 deficiency, CAD, CHF and advanced age.      PLAN:  Recent stroke:  Hx of prior strokes Residual deficit: ***.  Continue aspirin 81 mg daily and clopidogrel 75 mg daily  and Crestor per cardiology recommendations and for secondary stroke prevention.   Discussed secondary stroke prevention measures and importance of close PCP follow up for aggressive stroke risk factor management including BP goal<130/90, HLD with LDL goal<70 and DM with A1c.<7 .  Stroke labs 10/2021: LDL 59, A1c 5.4 I have gone over the pathophysiology of stroke, warning signs and symptoms, risk factors and their management in some detail with instructions to go to the closest emergency room for symptoms of concern. Hx of  seizure:  Continue Depakote 500 mg twice daily for seizure prophylaxis and mood stabilization. Valproic acid level 39 (11/2021) Repeat ammonia level improved    Follow up in *** or call earlier if needed   CC:  [facility] provider: Dr. Leonie Man PCP: Jeremy Squibb, MD    I spent *** minutes of face-to-face and non-face-to-face time with patient.  This included previsit chart review including review of recent hospitalization, lab review, study review, order entry, electronic health record documentation, patient education regarding recent stroke including etiology, secondary stroke prevention measures and importance of managing stroke risk factors, residual deficits and typical recovery time and answered all other questions to patient satisfaction   Frann Rider, AGNP-BC  [facility] [address] [place], [place] [address]  Phone [phone] Fax [phone] Note: This document was prepared with digital dictation and possible smart phrase technology. Any transcriptional errors that result from this process are unintentional.

## 2022-01-06 ENCOUNTER — Ambulatory Visit (INDEPENDENT_AMBULATORY_CARE_PROVIDER_SITE_OTHER): Payer: PPO | Admitting: Adult Health

## 2022-01-06 ENCOUNTER — Encounter: Payer: Self-pay | Admitting: Adult Health

## 2022-01-06 VITALS — BP 159/66 | HR 54 | Ht 67.0 in | Wt 220.0 lb

## 2022-01-06 DIAGNOSIS — R569 Unspecified convulsions: Secondary | ICD-10-CM | POA: Diagnosis not present

## 2022-01-06 DIAGNOSIS — R29818 Other symptoms and signs involving the nervous system: Secondary | ICD-10-CM | POA: Diagnosis not present

## 2022-01-06 DIAGNOSIS — Z09 Encounter for follow-up examination after completed treatment for conditions other than malignant neoplasm: Secondary | ICD-10-CM | POA: Diagnosis not present

## 2022-01-06 DIAGNOSIS — I639 Cerebral infarction, unspecified: Secondary | ICD-10-CM

## 2022-01-06 MED ORDER — DIVALPROEX SODIUM 500 MG PO DR TAB
500.0000 mg | DELAYED_RELEASE_TABLET | Freq: Two times a day (BID) | ORAL | 3 refills | Status: DC
Start: 2022-01-06 — End: 2022-07-05

## 2022-01-06 NOTE — Patient Instructions (Addendum)
Continue depakote '500mg'$  twice daily  Please let me know if you would like to participate in any type of speech therapy for word finding difficulty  Please let me know if you would like to be evaluate for underlying sleep apnea as untreated sleep apnea can increase risk of stroke and further heart disease   Continue aspirin 81 mg daily and clopidogrel 75 mg daily  and Crestor  for secondary stroke prevention  Continue to follow up with PCP regarding cholesterol and blood pressure management  Maintain strict control of hypertension with blood pressure goal below 130/90 and cholesterol with LDL cholesterol (bad cholesterol) goal below 70 mg/dL.   Signs of a Stroke? Follow the BEFAST method:  Balance Watch for a sudden loss of balance, trouble with coordination or vertigo Eyes Is there a sudden loss of vision in one or both eyes? Or double vision?  Face: Ask the person to smile. Does one side of the face droop or is it numb?  Arms: Ask the person to raise both arms. Does one arm drift downward? Is there weakness or numbness of a leg? Speech: Ask the person to repeat a simple phrase. Does the speech sound slurred/strange? Is the person confused ? Time: If you observe any of these signs, call 911.      Followup in the future with me in 6 months or call earlier if needed       Thank you for coming to see [place] at [facility]. I hope we have been able to provide you high quality care today.  You may receive a patient satisfaction survey over the next few weeks. We would appreciate your feedback and comments so that we may continue to improve ourselves and the health of our patients.

## 2022-01-07 NOTE — Progress Notes (Signed)
I agree with the above plan 

## 2022-01-14 ENCOUNTER — Other Ambulatory Visit: Payer: Self-pay | Admitting: Physician Assistant

## 2022-01-14 ENCOUNTER — Other Ambulatory Visit: Payer: Self-pay

## 2022-01-14 MED ORDER — PANTOPRAZOLE SODIUM 40 MG PO TBEC: 40.0000 mg | DELAYED_RELEASE_TABLET | Freq: Every day | ORAL | 3 refills | Status: AC

## 2022-01-17 ENCOUNTER — Other Ambulatory Visit: Payer: Self-pay | Admitting: Cardiovascular Disease

## 2022-01-18 ENCOUNTER — Ambulatory Visit: Payer: PPO | Admitting: Cardiovascular Disease

## 2022-02-10 NOTE — Progress Notes (Signed)
Cardiology Office Note    Date:  02/23/2022   ID:  Jeremy [name], DOB 24-Jun-1945, MRN [id number]  PCP:  Celene Squibb, MD  Cardiologist: Jenkins Rouge, MD     History of Present Illness:    76 y.o. previously seen by Dr Julianne Rice. History of CAD with stenting of the CircumlfexLAD. CRF;s HTN, HLD. PVD with history CVA and right CEA   NSTEMI 03/25/19 peak troponin 5345 ICLBBB on ECG  Cath by Dr. Angelena Form on 03/26/2019 and showed 50% prox-RCA stenosis, 60% dRCA, 50% RPDA, 90% prox LCx, 99% Prox-LAD, 70% Distal LCx, 60% 2nd Mrg, 80% 2nd Diagonal and preserved EF of 50-55%. Was treated with PTCA/DESx1 to the LAD and successful scoring balloon angioplasty in the previously stented segment of LCx.   Life long DAT with stents , restenosis and CVA   Admitted 09/18/20 with chest pain diaphoresis r/o TTE normal EF no RWMAls And no acute ECG changes Cr elevated 1.9 from baseline 1.6 ACE d/c and imdur started   Admitted 11/14/21 with angina old LBBB and negative troponins .  CAth with patent stents to proximal LAD and LCX Had 80% mid RCA lesion with new stent. Has residual small vessel disease in OM and diagonals EF preserved by TTE 60-65% 11/14/21 Carotids with plaque no stenosis Had ? Embolic stroke post cath with aphasia, confusion and combativeness Was reloaded with Depakote MRI with right PCA stroke   Long discussion about need for weight loss will refer to cardiac rehab in [place]  Past Medical History:  Diagnosis Date   Arthritis    Basal cell carcinoma 02/11/2021   nod- left dorsal hand (EXC)   CAD (coronary artery disease)    a. s/p prior stenting of LAD and RCA b. 03/2019: NSTEMI and required PTCA/DESx1 to the LAD and successful scoring balloon angioplasty in the previously stented segment of LCx   Carotid stenosis, asymptomatic, right    Carpal tunnel syndrome    bilateral   Coronary artery disease    GERD (gastroesophageal reflux disease)    History of kidney stones    Hx of  colonic polyp    Hypercholesterolemia    Hypertension    Myocardial infarction ([place])    hx of   Obesity    SCC (squamous cell carcinoma) 02/11/2021   in situ- right temple ([id number])   Squamous cell carcinoma of skin 08/09/2016   well differentiated on right outer eye - tx p bx   Stroke [facility])     Past Surgical History:  Procedure Laterality Date   CARDIAC CATHETERIZATION     CATARACT EXTRACTION W/PHACO Left 03/12/2021   Procedure: CATARACT EXTRACTION PHACO AND INTRAOCULAR LENS PLACEMENT ([place]);  Surgeon: Baruch Goldmann, MD;  Location: [facility];  Service: Ophthalmology;  Laterality: Left;   CDE: 14.58   CATARACT EXTRACTION W/PHACO Right 04/08/2021   Procedure: CATARACT EXTRACTION PHACO AND INTRAOCULAR LENS PLACEMENT RIGHT EYE;  Surgeon: Baruch Goldmann, MD;  Location: [facility];  Service: Ophthalmology;  Laterality: Right;  CDE 10.94   CORONARY BALLOON ANGIOPLASTY N/A 03/26/2019   Procedure: CORONARY BALLOON ANGIOPLASTY;  Surgeon: Burnell Blanks, MD;  Location: [facility];  Service: Cardiovascular;  Laterality: N/A;   CORONARY STENT INTERVENTION N/A 03/26/2019   Procedure: CORONARY STENT INTERVENTION;  Surgeon: Burnell Blanks, MD;  Location: [facility];  Service: Cardiovascular;  Laterality: N/A;   CORONARY STENT INTERVENTION N/A 11/15/2021   Procedure: CORONARY STENT INTERVENTION;  Surgeon: Lorretta Harp, MD;  Location:  [place] [facility];  Service: Cardiovascular;  Laterality: N/A;   CORONARY STENT PLACEMENT  2000   x3    CYST EXCISION     x 2   ENDARTERECTOMY Right 12/25/2017   Procedure: ENDARTERECTOMY CAROTID RIGHT;  Surgeon: Rosetta Posner, MD;  Location: [facility];  Service: Vascular;  Laterality: Right;   LEFT HEART CATH AND CORONARY ANGIOGRAPHY N/A 03/26/2019   Procedure: LEFT HEART CATH AND CORONARY ANGIOGRAPHY;  Surgeon: Burnell Blanks, MD;  Location: [facility];  Service: Cardiovascular;  Laterality: N/A;   LEFT HEART CATH AND  CORONARY ANGIOGRAPHY N/A 11/15/2021   Procedure: LEFT HEART CATH AND CORONARY ANGIOGRAPHY;  Surgeon: Lorretta Harp, MD;  Location: [facility];  Service: Cardiovascular;  Laterality: N/A;   PATCH ANGIOPLASTY Right 12/25/2017   Procedure: PATCH ANGIOPLASTY RIGHT CAROTID ARTERY;  Surgeon: Rosetta Posner, MD;  Location: [facility];  Service: Vascular;  Laterality: Right;   TEAR DUCT PROBING Bilateral 07/2020   done at [place]. Dr. Norlene Duel   TEE WITHOUT CARDIOVERSION N/A 06/29/2018   Procedure: TRANSESOPHAGEAL ECHOCARDIOGRAM (TEE);  Surgeon: Fay Records, MD;  Location: [facility]OS[facility];  Service: Cardiovascular;  Laterality: N/A;    Current Medications: Outpatient Medications Prior to Visit  Medication Sig Dispense Refill   acetaminophen (TYLENOL) 500 MG tablet Take 500 mg by mouth every 6 (six) hours as needed for mild pain.     aspirin EC 81 MG EC tablet Take 1 tablet (81 mg total) by mouth daily. 90 tablet 3   carvedilol (COREG) 12.5 MG tablet Take 1 tablet (12.5 mg total) by mouth 2 (two) times daily with a meal. 60 tablet 0   clopidogrel (PLAVIX) 75 MG tablet Take 1 tablet by mouth once daily 90 tablet 3   divalproex (DEPAKOTE) 500 MG DR tablet Take 1 tablet (500 mg total) by mouth every 12 (twelve) hours. 180 tablet 3   gabapentin (NEURONTIN) 300 MG capsule Take 1 capsule (300 mg total) by mouth 3 (three) times daily. 90 capsule 6   isosorbide mononitrate (IMDUR) 30 MG 24 hr tablet Take 1 tablet by mouth once daily 90 tablet 1   losartan (COZAAR) 50 MG tablet Take 1 tablet (50 mg total) by mouth daily. 90 tablet 3   nitroGLYCERIN (NITROSTAT) 0.4 MG SL tablet Place 1 tablet (0.4 mg total) under the tongue every 5 (five) minutes as needed for chest pain. 25 tablet 3   nystatin (MYCOSTATIN/NYSTOP) powder Apply 1 Application topically in the morning and at bedtime.     nystatin cream (MYCOSTATIN) Apply 1 Application topically 2 (two) times daily.     oxyCODONE (ROXICODONE) 15 MG immediate  release tablet Take 15 mg by mouth every 6 (six) hours as needed for pain.     pantoprazole (PROTONIX) 40 MG tablet Take 1 tablet (40 mg total) by mouth daily. 90 tablet 3   Propylene Glycol (SYSTANE COMPLETE OP) Place 1 drop into both eyes daily as needed (dry eyes).     RESTASIS MULTIDOSE 0.05 % ophthalmic emulsion Place 1 drop into both eyes 2 (two) times daily.     rosuvastatin (CRESTOR) 40 MG tablet Take 40 mg by mouth at bedtime.     XIIDRA 5 % SOLN Place 1 drop into both eyes daily as needed (dry eye).     No facility-administered medications prior to visit.     Allergies:   Codeine, Oxycontin [oxycodone hcl], Contrast media [iodinated contrast media], and Morphine and related  Social History   Socioeconomic History   Marital status: Divorced    Spouse name: Not on file   Number of children: Not on file   Years of education: Not on file   Highest education level: Not on file  Occupational History   Occupation: retired    Comment: welder  Tobacco Use   Smoking status: Former    Packs/day: 2.00    Years: 20.00    Total pack years: 40.00    Types: Cigarettes    Quit date: 05/23/1988    Years since quitting: 33.7   Smokeless tobacco: Never   Tobacco comments:    smoked 2 packs per day for 20 years  Vaping Use   Vaping Use: Never used  Substance and Sexual Activity   Alcohol use: No   Drug use: No   Sexual activity: Yes  Other Topics Concern   Not on file  Social History Narrative   Not on file   Social Determinants of Health   Financial Resource Strain: Not on file  Food Insecurity: Not on file  Transportation Needs: Not on file  Physical Activity: Not on file  Stress: Not on file  Social Connections: Not on file     Family History:  The patient's family history includes Coronary artery disease in his brother; Coronary artery disease (age of onset: 50) in his mother; Hypertension in his mother; Other (age of onset: 36) in his father; Other (age of onset: 38)  in his sister; Skin cancer in his brother.   Review of Systems:   Please see the history of present illness.     General:  No chills, fever, night sweats or weight changes. Positive for bilateral shoulder pain (occurring for several months). Cardiovascular:  No chest pain, dyspnea on exertion, edema, orthopnea, palpitations, paroxysmal nocturnal dyspnea. Dermatological: No rash, lesions/masses Respiratory: No cough, dyspnea Urologic: No hematuria, dysuria Abdominal:   No nausea, vomiting, diarrhea, bright red blood per rectum, melena, or hematemesis Neurologic:  No visual changes, wkns, changes in mental status. All other systems reviewed and are otherwise negative except as noted above.   Physical Exam:    VS:  There were no vitals taken for this visit.    Affect appropriate Healthy:  appears stated age 70: normal Neck supple with no adenopathy JVP normal right CEA s no thyromegaly Lungs clear with no wheezing and good diaphragmatic motion Heart:  S1/S2 no murmur, no rub, gallop or click PMI normal Abdomen: benighn, BS positve, no tenderness, no AAA no bruit.  No HSM or HJR Distal pulses intact with no bruits No edema Neuro non-focal Skin warm and dry No muscular weakness Restricted ROM both shoulders    Wt Readings from Last 3 Encounters:  01/06/22 220 lb (99.8 kg)  11/26/21 217 lb 6.4 oz (98.6 kg)  11/14/21 219 lb 4.8 oz (99.5 kg)     Studies/Labs Reviewed:   EKG:   NSR, HR 65 with anterior infarct pattern and diffuse TWI along the lateral leads which has improved when compared to his prior tracing.   Recent Labs: 11/13/2021: Magnesium 2.1 11/15/2021: TSH 2.585 11/18/2021: ALT 18 11/26/2021: Hemoglobin 13.2; Platelets 197 12/06/2021: BUN 23; Creatinine, Ser 1.39; Potassium 4.6; Sodium 142   Lipid Panel    Component Value Date/Time   CHOL 124 11/14/2021 0245   CHOL 125 02/11/2019 0847   TRIG 164 (H) 11/14/2021 0245   HDL 32 (L) 11/14/2021 0245   HDL 45  02/11/2019 0847   CHOLHDL 3.9  11/14/2021 0245   VLDL 33 11/14/2021 0245   LDLCALC 59 11/14/2021 0245   LDLCALC 64 02/11/2019 0847    Additional studies/ records that were reviewed today include:   Cardiac Catheterization: 11/15/21  Diagnostic Dominance: Right Left Anterior Descending  Vessel is large.  Non-stenotic Prox LAD lesion was previously treated.    Second Diagonal Branch  Vessel is small in size.  2nd Diag lesion is 80% stenosed.    Left Circumflex  Vessel is large.  Prox Cx to Mid Cx lesion is 20% stenosed. The lesion was previously treated using a stent (unknown type) over 2 years ago.  Dist Cx lesion is 70% stenosed.    Second Obtuse Marginal Branch  Vessel is moderate in size.  2nd Mrg lesion is 60% stenosed.    Third Obtuse Marginal Branch  Vessel is moderate in size.  3rd Mrg lesion is 90% stenosed.    Right Coronary Artery  Vessel is large.  Prox RCA to Mid RCA lesion is 80% stenosed. The lesion was previously treated .  Dist RCA lesion is 90% stenosed.    Right Posterior Descending Artery  RPDA lesion is 50% stenosed.    Intervention   Prox RCA to Mid RCA lesion  Stent  Lesion crossed with guidewire. Pre-stent angioplasty was performed. A drug-eluting stent was successfully placed using a SYNERGY XD 2.50X32. Stent strut is well apposed. Stent overlaps previously placed stent. Post-stent angioplasty was performed.  Post-Intervention Lesion Assessment  The intervention was successful. Pre-interventional TIMI flow is 3. Post-intervention TIMI flow is 3. No-reflow occurred during the intervention.  There is a 0% residual stenosis post intervention.    Dist RCA lesion  Stent  Lesion crossed with guidewire. Pre-stent angioplasty was performed. A stent was successfully placed. Stent strut is well apposed. Stent does not overlap previously placed stentPost-stent angioplasty was not performed.  Post-Intervention Lesion Assessment  The intervention was  successful. Pre-interventional TIMI flow is 3. Post-intervention TIMI flow is 3. No complications occurred at this lesion.  There is a 0% residual stenosis post intervention.      Echo 11/14/21   IMPRESSIONS     1. Left ventricular ejection fraction, by estimation, is 60 to 65%. The  left ventricle has normal function. The left ventricle has no regional  wall motion abnormalities. There is mild left ventricular hypertrophy.  Left ventricular diastolic parameters  are consistent with Grade I diastolic dysfunction (impaired relaxation).  The average left ventricular global longitudinal strain is -13.3 %. The  global longitudinal strain is abnormal.   2. Right ventricular systolic function is normal. The right ventricular  size is normal.   3. The mitral valve is normal in structure. Trivial mitral valve  regurgitation. No evidence of mitral stenosis.   4. The aortic valve is tricuspid. Aortic valve regurgitation is not  visualized. No aortic stenosis is present.   5. The inferior vena cava is normal in size with greater than 50%  respiratory variability, suggesting right atrial pressure of 3 mmHg.   Comparison(s): No significant change from prior study. Prior images  reviewed side by side. 08/23/20-EF 60-65%.    Plan:   In order of problems listed above:  1. CAD:  Distant history of RCA stent and 03/2019 intervention to LAD and circumflex  Cath 11/14/21 restenosis of stents to mid/distal RCA with repeat intervention Residual small diagonal and OM dx stents to proximal LAD and mid LCX patent Continue medical Rx   2. HTN Well controlled.  Continue current  medications and low sodium Dash type diet.    3. HLD  LDL 36 09/20/20 continue crestor    4. History of CVA and another post cath cerebellar June 2023 carotids ok on Depakote f/u with neuro   5. CRF:  Baseline Cr 1.4     F/U  In a year    Medication Adjustments/Labs and Tests Ordered: Current medicines are reviewed at length  with the patient today.  Concerns regarding medicines are outlined above.  Medication changes, Labs and Tests ordered today are listed in the Patient Instructions below. There are no Patient Instructions on file for this visit.   Signed, Jenkins Rouge, MD  02/23/2022 8:20 AM    [facility] [address] [place], [place] [address] Phone: [phone] Fax: [phone]

## 2022-02-15 ENCOUNTER — Ambulatory Visit: Payer: Medicare HMO | Admitting: Physician Assistant

## 2022-02-21 ENCOUNTER — Ambulatory Visit: Payer: PPO | Admitting: Cardiovascular Disease

## 2022-02-22 ENCOUNTER — Ambulatory Visit: Payer: Medicare HMO | Admitting: Physician Assistant

## 2022-02-23 ENCOUNTER — Ambulatory Visit: Payer: PPO | Attending: Cardiovascular Disease | Admitting: Cardiovascular Disease

## 2022-02-23 VITALS — BP 150/58 | HR 57 | Ht 67.0 in | Wt 217.0 lb

## 2022-02-23 DIAGNOSIS — I6523 Occlusion and stenosis of bilateral carotid arteries: Secondary | ICD-10-CM

## 2022-02-23 DIAGNOSIS — I2510 Atherosclerotic heart disease of native coronary artery without angina pectoris: Secondary | ICD-10-CM

## 2022-02-23 DIAGNOSIS — E785 Hyperlipidemia, unspecified: Secondary | ICD-10-CM | POA: Diagnosis not present

## 2022-02-23 MED ORDER — CARVEDILOL 12.5 MG PO TABS: 12.5000 mg | ORAL_TABLET | Freq: Two times a day (BID) | ORAL | 3 refills | Status: DC

## 2022-02-23 NOTE — Patient Instructions (Signed)
Medication Instructions:  Your physician recommends that you continue on your current medications as directed. Please refer to the Current Medication list given to you today.  *If you need a refill on your cardiac medications before your next appointment, please call your pharmacy*  Lab Work: If you have labs (blood work) drawn today and your tests are completely normal, you will receive your results only by: [place] (if you have MyChart) OR A paper copy in the mail If you have any lab test that is abnormal or we need to change your treatment, we will call you to review the results.  Testing/Procedures: None ordered today.  Follow-Up: At [facility], you and your health needs are our priority.  As part of our continuing mission to provide you with exceptional heart care, we have created designated Provider Care Teams.  These Care Teams include your primary Cardiologist (physician) and Advanced Practice Providers (APPs -  Physician Assistants and Nurse Practitioners) who all work together to provide you with the care you need, when you need it.  We recommend signing up for the patient portal called "MyChart".  Sign up information is provided on this After Visit Summary.  MyChart is used to connect with patients for Virtual Visits (Telemedicine).  Patients are able to view lab/test results, encounter notes, upcoming appointments, etc.  Non-urgent messages can be sent to your provider as well.   To learn more about what you can do with MyChart, go to [email or web address].    Your next appointment:   3 months- In [place]  The format for your next appointment:   In Person  Provider:   Jenkins Rouge, MD     Other Instructions You have been referred to Cardiac Rehab in [place].   Important Information About Sugar

## 2022-03-04 ENCOUNTER — Encounter (HOSPITAL_COMMUNITY): Payer: Self-pay

## 2022-03-04 ENCOUNTER — Encounter (HOSPITAL_COMMUNITY)
Admission: RE | Admit: 2022-03-04 | Discharge: 2022-03-04 | Disposition: A | Discharge status: Home / Self Care | Payer: PPO | Source: Ambulatory Visit | Attending: Cardiovascular Disease | Admitting: Cardiovascular Disease

## 2022-03-04 VITALS — BP 100/50 | HR 61 | Ht 67.0 in | Wt 213.2 lb

## 2022-03-04 DIAGNOSIS — Z955 Presence of coronary angioplasty implant and graft: Secondary | ICD-10-CM | POA: Diagnosis not present

## 2022-03-04 NOTE — Progress Notes (Signed)
Cardiac Individual Treatment Plan  Patient Details  Name: Jeremy [name] MRN: [id number] Date of Birth: Jul 16, 1945 Referring Provider:   Flowsheet Row CARDIAC REHAB PHASE II ORIENTATION from 03/04/2022 in [place]  Referring Provider Dr. Johnsie Cancel       Initial Encounter Date:  Flowsheet Row CARDIAC REHAB PHASE II ORIENTATION from 03/04/2022 in [place]  Date 03/04/22       Visit Diagnosis: No diagnosis found.  Patient's Home Medications on Admission:  Current Outpatient Medications:    acetaminophen (TYLENOL) 500 MG tablet, Take 500 mg by mouth every 6 (six) hours as needed for mild pain., Disp: , Rfl:    aspirin EC 81 MG EC tablet, Take 1 tablet (81 mg total) by mouth daily., Disp: 90 tablet, Rfl: 3   carvedilol (COREG) 12.5 MG tablet, Take 1 tablet (12.5 mg total) by mouth 2 (two) times daily with a meal., Disp: 180 tablet, Rfl: 3   clopidogrel (PLAVIX) 75 MG tablet, Take 1 tablet by mouth once daily (Patient taking differently: Take 75 mg by mouth every evening.), Disp: 90 tablet, Rfl: 3   divalproex (DEPAKOTE) 500 MG DR tablet, Take 1 tablet (500 mg total) by mouth every 12 (twelve) hours., Disp: 180 tablet, Rfl: 3   gabapentin (NEURONTIN) 300 MG capsule, Take 1 capsule (300 mg total) by mouth 3 (three) times daily., Disp: 90 capsule, Rfl: 6   isosorbide mononitrate (IMDUR) 30 MG 24 hr tablet, Take 1 tablet by mouth once daily (Patient taking differently: Take 30 mg by mouth daily after breakfast.), Disp: 90 tablet, Rfl: 1   losartan (COZAAR) 50 MG tablet, Take 1 tablet (50 mg total) by mouth daily., Disp: 90 tablet, Rfl: 3   nitroGLYCERIN (NITROSTAT) 0.4 MG SL tablet, Place 1 tablet (0.4 mg total) under the tongue every 5 (five) minutes as needed for chest pain., Disp: 25 tablet, Rfl: 3   nystatin (MYCOSTATIN/NYSTOP) powder, Apply 1 Application topically 2 (two) times daily as needed (skin irritation/rash.)., Disp: , Rfl:     nystatin cream (MYCOSTATIN), Apply 1 Application topically 2 (two) times daily as needed (severe irritation/rash)., Disp: , Rfl:    oxyCODONE (ROXICODONE) 15 MG immediate release tablet, Take 15 mg by mouth every 6 (six) hours as needed for pain., Disp: , Rfl:    pantoprazole (PROTONIX) 40 MG tablet, Take 1 tablet (40 mg total) by mouth daily., Disp: 90 tablet, Rfl: 3   RESTASIS MULTIDOSE 0.05 % ophthalmic emulsion, Place 1 drop into both eyes 2 (two) times daily as needed (dry/irritated eyes.)., Disp: , Rfl:    rosuvastatin (CRESTOR) 40 MG tablet, Take 40 mg by mouth at bedtime., Disp: , Rfl:    XIIDRA 5 % SOLN, Place 1 drop into both eyes daily as needed (dry eye)., Disp: , Rfl:   Past Medical History: Past Medical History:  Diagnosis Date   Arthritis    Basal cell carcinoma 02/11/2021   nod- left dorsal hand (EXC)   CAD (coronary artery disease)    a. s/p prior stenting of LAD and RCA b. 03/2019: NSTEMI and required PTCA/DESx1 to the LAD and successful scoring balloon angioplasty in the previously stented segment of LCx   Carotid stenosis, asymptomatic, right    Carpal tunnel syndrome    bilateral   Coronary artery disease    GERD (gastroesophageal reflux disease)    History of kidney stones    Hx of colonic polyp    Hypercholesterolemia    Hypertension  Myocardial infarction (HCC)    hx of   Obesity    SCC (squamous cell carcinoma) 02/11/2021   in situ- right temple ([id number])   Squamous cell carcinoma of skin 08/09/2016   well differentiated on right outer eye - tx p bx   Stroke ([place])     Tobacco Use: Social History   Tobacco Use  Smoking Status Former   Packs/day: 2.00   Years: 20.00   Total pack years: 40.00   Types: Cigarettes   Quit date: 05/23/1988   Years since quitting: 33.8  Smokeless Tobacco Never  Tobacco Comments   smoked 2 packs per day for 20 years    Labs: Review Flowsheet  More data exists      Latest Ref Rng & Units 03/26/2019 09/20/2020 11/13/2021  11/14/2021 11/15/2021  Labs for ITP Cardiac and Pulmonary Rehab  Cholestrol 0 - 200 mg/dL 87  112  - 124  -  LDL (calc) 0 - 99 mg/dL 36  44  - 59  -  HDL-C >40 mg/dL 32  33  - 32  -  Trlycerides <150 mg/dL 95  177  - 164  -  Hemoglobin A1c 4.8 - 5.6 % - - 5.4  - -  PH, Arterial 7.35 - 7.45 - - - - 7.354   PCO2 arterial 32 - 48 mmHg - - - - 43.4   Bicarbonate 20.0 - 28.0 mmol/L - - - - 24.1   TCO2 22 - 32 mmol/L - - - - 25   Acid-base deficit 0.0 - 2.0 mmol/L - - - - 1.0   O2 Saturation % - - - - 97     Capillary Blood Glucose: Lab Results  Component Value Date   GLUCAP 93 11/16/2021   GLUCAP 78 11/15/2021   GLUCAP 75 11/15/2021   GLUCAP 87 06/26/2019   GLUCAP 130 (H) 01/30/2019     Exercise Target Goals: Exercise Program Goal: Individual exercise prescription set using results from initial 6 min walk test and THRR while considering  patient's activity barriers and safety.   Exercise Prescription Goal: Starting with aerobic activity 30 plus minutes a day, 3 days per week for initial exercise prescription. Provide home exercise prescription and guidelines that participant acknowledges understanding prior to discharge.  Activity Barriers & Risk Stratification:  Activity Barriers & Cardiac Risk Stratification - 03/04/22 1257       Activity Barriers & Cardiac Risk Stratification   Activity Barriers Arthritis;Back Problems;Shortness of Breath    Cardiac Risk Stratification High             6 Minute Walk:  6 Minute Walk     Row Name 03/04/22 1355         6 Minute Walk   Phase Initial     Distance 1200 feet     Walk Time 6 minutes     # of Rest Breaks 0     MPH 2.27     METS 2.05     RPE 13     VO2 Peak 7.19     Symptoms Yes (comment)     Comments Bilateral hip pain while walking (6/10)     Resting HR 61 bpm     Resting BP 100/50     Resting Oxygen Saturation  94 %     Exercise Oxygen Saturation  during 6 min walk 96 %     Max Ex. HR 88 bpm     Max Ex. BP  140/58  2 Minute Post BP 120/52              Oxygen Initial Assessment:   Oxygen Re-Evaluation:   Oxygen Discharge (Final Oxygen Re-Evaluation):   Initial Exercise Prescription:  Initial Exercise Prescription - 03/04/22 1300       Date of Initial Exercise RX and Referring Provider   Date 03/04/22    Referring Provider Dr. Johnsie Cancel      Treadmill   MPH 1.3    Grade 0    Minutes 17      NuStep   Level 1    SPM 60    Minutes 22      Prescription Details   Frequency (times per week) 3    Duration Progress to 30 minutes of continuous aerobic without signs/symptoms of physical distress      Intensity   THRR 40-80% of Max Heartrate 58-116    Ratings of Perceived Exertion 11-13      Resistance Training   Training Prescription Yes    Weight 3    Reps 10-15             Perform Capillary Blood Glucose checks as needed.  Exercise Prescription Changes:   Exercise Comments:   Exercise Goals and Review:   Exercise Goals     Row Name 03/04/22 1357             Exercise Goals   Increase Physical Activity Yes       Intervention Provide advice, education, support and counseling about physical activity/exercise needs.;Develop an individualized exercise prescription for aerobic and resistive training based on initial evaluation findings, risk stratification, comorbidities and participant's personal goals.       Expected Outcomes Short Term: Attend rehab on a regular basis to increase amount of physical activity.;Long Term: Add in home exercise to make exercise part of routine and to increase amount of physical activity.;Long Term: Exercising regularly at least 3-5 days a week.       Increase Strength and Stamina Yes       Intervention Provide advice, education, support and counseling about physical activity/exercise needs.;Develop an individualized exercise prescription for aerobic and resistive training based on initial evaluation findings, risk  stratification, comorbidities and participant's personal goals.       Expected Outcomes Short Term: Increase workloads from initial exercise prescription for resistance, speed, and METs.;Short Term: Perform resistance training exercises routinely during rehab and add in resistance training at home;Long Term: Improve cardiorespiratory fitness, muscular endurance and strength as measured by increased METs and functional capacity (6MWT)       Able to understand and use rate of perceived exertion (RPE) scale Yes       Intervention Provide education and explanation on how to use RPE scale       Expected Outcomes Long Term:  Able to use RPE to guide intensity level when exercising independently;Short Term: Able to use RPE daily in rehab to express subjective intensity level       Knowledge and understanding of Target Heart Rate Range (THRR) Yes       Intervention Provide education and explanation of THRR including how the numbers were predicted and where they are located for reference       Expected Outcomes Short Term: Able to state/look up THRR;Short Term: Able to use daily as guideline for intensity in rehab;Long Term: Able to use THRR to govern intensity when exercising independently       Able to check pulse independently Yes  Intervention Provide education and demonstration on how to check pulse in carotid and radial arteries.;Review the importance of being able to check your own pulse for safety during independent exercise       Expected Outcomes Short Term: Able to explain why pulse checking is important during independent exercise;Long Term: Able to check pulse independently and accurately       Understanding of Exercise Prescription Yes       Intervention Provide education, explanation, and written materials on patient's individual exercise prescription       Expected Outcomes Short Term: Able to explain program exercise prescription;Long Term: Able to explain home exercise prescription to  exercise independently                Exercise Goals Re-Evaluation :    Discharge Exercise Prescription (Final Exercise Prescription Changes):   Nutrition:  Target Goals: Understanding of nutrition guidelines, daily intake of sodium '1500mg'$ , cholesterol '200mg'$ , calories 30% from fat and 7% or less from saturated fats, daily to have 5 or more servings of fruits and vegetables.  Biometrics:  Pre Biometrics - 03/04/22 1358       Pre Biometrics   Height '5\' 7"'$  (1.702 m)    Weight 96.7 kg    Waist Circumference 47 inches    Hip Circumference 42 inches    Waist to Hip Ratio 1.12 %    BMI (Calculated) 33.38    Triceps Skinfold 18 mm    % Body Fat 34.1 %    Grip Strength 38.36 kg    Flexibility 0 in    Single Leg Stand 0 seconds              Nutrition Therapy Plan and Nutrition Goals:  Nutrition Therapy & Goals - 03/04/22 1339       Personal Nutrition Goals   Comments Patient scored 88 on his diet assessment. Handout provided and explained regarding healthier choices. He and his wife verbalized understanding. They are interested in meeting with a dietician. A referral will be made. We offer 2 educational sessions regarding heart healthy nutrition with handouts.      Intervention Plan   Intervention Nutrition handout(s) given to patient.    Expected Outcomes Short Term Goal: Understand basic principles of dietary content, such as calories, fat, sodium, cholesterol and nutrients.             Nutrition Assessments:  Nutrition Assessments - 03/04/22 1338       MEDFICTS Scores   Pre Score 88            MEDIFICTS Score Key: ?70 Need to make dietary changes  40-70 Heart Healthy Diet ? 40 Therapeutic Level Cholesterol Diet   Picture Your Plate Scores: <93 Unhealthy dietary pattern with much room for improvement. 41-50 Dietary pattern unlikely to meet recommendations for good health and room for improvement. 51-60 More healthful dietary pattern, with some  room for improvement.  >60 Healthy dietary pattern, although there may be some specific behaviors that could be improved.    Nutrition Goals Re-Evaluation:   Nutrition Goals Discharge (Final Nutrition Goals Re-Evaluation):   Psychosocial: Target Goals: Acknowledge presence or absence of significant depression and/or stress, maximize coping skills, provide positive support system. Participant is able to verbalize types and ability to use techniques and skills needed for reducing stress and depression.  Initial Review & Psychosocial Screening:  Initial Psych Review & Screening - 03/04/22 1346       Initial Review   Current issues with  None Identified;Current Psychotropic Meds      Family Dynamics   Good Support System? Yes      Barriers   Psychosocial barriers to participate in program There are no identifiable barriers or psychosocial needs.;The patient should benefit from training in stress management and relaxation.      Screening Interventions   Interventions Encouraged to exercise;Provide feedback about the scores to participant    Expected Outcomes Short Term goal: Identification and review with participant of any Quality of Life or Depression concerns found by scoring the questionnaire.             Quality of Life Scores:  Scores of 19 and below usually indicate a poorer quality of life in these areas.  A difference of  2-3 points is a clinically meaningful difference.  A difference of 2-3 points in the total score of the Quality of Life Index has been associated with significant improvement in overall quality of life, self-image, physical symptoms, and general health in studies assessing change in quality of life.  PHQ-9: Review Flowsheet       03/04/2022 01/06/2022  Depression screen PHQ 2/9  Decreased Interest 1 0  Down, Depressed, Hopeless 0 0  PHQ - 2 Score 1 0  Altered sleeping 1 -  Tired, decreased energy 3 -  Change in appetite 3 -  Feeling bad or  failure about yourself  0 -  Trouble concentrating 1 -  Moving slowly or fidgety/restless 0 -  Suicidal thoughts 0 -  PHQ-9 Score 9 -  Difficult doing work/chores Somewhat difficult -   Interpretation of Total Score  Total Score Depression Severity:  1-4 = Minimal depression, 5-9 = Mild depression, 10-14 = Moderate depression, 15-19 = Moderately severe depression, 20-27 = Severe depression   Psychosocial Evaluation and Intervention:  Psychosocial Evaluation - 03/04/22 1412       Psychosocial Evaluation & Interventions   Interventions Stress management education;Relaxation education;Encouraged to exercise with the program and follow exercise prescription    Comments Patient has no psychosocial barriers identified at his orientation visit. His PHQ-9 score was 9 mainly due to sleeping too much and having little energy. His wife was with him today and helped him answer questions. They have been married for 3 years. Patient has had 2 CVA's effecting his vision and his mood. His wife reports that after his 2nd CVA, he became combative and aggressive. They put him on Depakote to manage his behaviors. She says the medication has really helped but she thinks he hallucinates at times due to the medication. Advised her to talk to his pcp about the hallucinations. Patient is very pleasant today and seems to be motivated to do the program. 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 His wife reports that some days he does not feel good and sleeps most of the day. She also says he has a poor appetite. Patient denies any depression or anxiety. Advised his wife she needed to tell his PCP about him sleeping too much and his appetite. She verbalized understanding. Patient has a good support system with his wife and he has 2 sons that support him. Patient is ready to start the program hoping to get stronger and lose weight.    Expected Outcomes Patient will continue to have no psychosocial barriers  identified.    Continue Psychosocial Services  No Follow up required             Psychosocial Re-Evaluation:  Psychosocial Discharge (Final Psychosocial Re-Evaluation):   Vocational Rehabilitation: Provide vocational rehab assistance to qualifying candidates.   Vocational Rehab Evaluation & Intervention:  Vocational Rehab - 03/04/22 1342       Initial Vocational Rehab Evaluation & Intervention   Assessment shows need for Vocational Rehabilitation No      Vocational Rehab Re-Evaulation   Comments Patient is retired and does not need voacational rehab.             Education: Education Goals: Education classes will be provided on a weekly basis, covering required topics. Participant will state understanding/return demonstration of topics presented.  Learning Barriers/Preferences:  Learning Barriers/Preferences - 03/04/22 1340       Learning Barriers/Preferences   Learning Barriers None    Learning Preferences Audio             Education Topics: Hypertension, Hypertension Reduction -Define heart disease and high blood pressure. Discus how high blood pressure affects the body and ways to reduce high blood pressure.   Exercise and Your Heart -Discuss why it is important to exercise, the FITT principles of exercise, normal and abnormal responses to exercise, and how to exercise safely.   Angina -Discuss definition of angina, causes of angina, treatment of angina, and how to decrease risk of having angina.   Cardiac Medications -Review what the following cardiac medications are used for, how they affect the body, and side effects that may occur when taking the medications.  Medications include Aspirin, Beta blockers, calcium channel blockers, ACE Inhibitors, angiotensin receptor blockers, diuretics, digoxin, and antihyperlipidemics.   Congestive Heart Failure -Discuss the definition of CHF, how to live with CHF, the signs and symptoms of CHF, and how keep  track of weight and sodium intake.   Heart Disease and Intimacy -Discus the effect sexual activity has on the heart, how changes occur during intimacy as we age, and safety during sexual activity.   Smoking Cessation / COPD -Discuss different methods to quit smoking, the health benefits of quitting smoking, and the definition of COPD.   Nutrition I: Fats -Discuss the types of cholesterol, what cholesterol does to the heart, and how cholesterol levels can be controlled.   Nutrition II: Labels -Discuss the different components of food labels and how to read food label   Heart Parts/Heart Disease and PAD -Discuss the anatomy of the heart, the pathway of blood circulation through the heart, and these are affected by heart disease.   Stress I: Signs and Symptoms -Discuss the causes of stress, how stress may lead to anxiety and depression, and ways to limit stress.   Stress II: Relaxation -Discuss different types of relaxation techniques to limit stress.   Warning Signs of Stroke / TIA -Discuss definition of a stroke, what the signs and symptoms are of a stroke, and how to identify when someone is having stroke.   Knowledge Questionnaire Score:  Knowledge Questionnaire Score - 03/04/22 1342       Knowledge Questionnaire Score   Pre Score 20/24             Core Components/Risk Factors/Patient Goals at Admission:  Personal Goals and Risk Factors at Admission - 03/04/22 1342       Core Components/Risk Factors/Patient Goals on Admission    Weight Management Obesity;Yes    Intervention Weight Management: Develop a combined nutrition and exercise program designed to reach desired caloric intake, while maintaining appropriate intake of nutrient and fiber, sodium and fats, and appropriate energy expenditure required for the weight  goal.;Weight Management: Provide education and appropriate resources to help participant work on and attain dietary goals.;Weight  Management/Obesity: Establish reasonable short term and long term weight goals.;Obesity: Provide education and appropriate resources to help participant work on and attain dietary goals.    Admit Weight 213 lb 9.6 oz (96.9 kg)    Goal Weight: Short Term 203 lb (92.1 kg)    Expected Outcomes Weight Maintenance: Understanding of the daily nutrition guidelines, which includes 25-35% calories from fat, 7% or less cal from saturated fats, less than '200mg'$  cholesterol, less than 1.5gm of sodium, & 5 or more servings of fruits and vegetables daily    Hypertension Yes    Intervention Provide education on lifestyle modifcations including regular physical activity/exercise, weight management, moderate sodium restriction and increased consumption of fresh fruit, vegetables, and low fat dairy, alcohol moderation, and smoking cessation.;Monitor prescription use compliance.    Expected Outcomes Short Term: Continued assessment and intervention until BP is < 140/71m HG in hypertensive participants. < 130/835mHG in hypertensive participants with diabetes, heart failure or chronic kidney disease.    Lipids Yes    Intervention Provide education and support for participant on nutrition & aerobic/resistive exercise along with prescribed medications to achieve LDL '70mg'$ , HDL >'40mg'$ .    Expected Outcomes Short Term: Participant states understanding of desired cholesterol values and is compliant with medications prescribed. Participant is following exercise prescription and nutrition guidelines.;Long Term: Cholesterol controlled with medications as prescribed, with individualized exercise RX and with personalized nutrition plan. Value goals: LDL < '70mg'$ , HDL > 40 mg.    Personal Goal Other Yes    Personal Goal Patient wants to lose weight and increase his strength and energy.    Intervention Patient will attend CR 3 days/week with exercise and education.    Expected Outcomes Patient will complete the program meeting both  personal and program goals.             Core Components/Risk Factors/Patient Goals Review:    Core Components/Risk Factors/Patient Goals at Discharge (Final Review):    ITP Comments:   Comments: Patient arrived for 1st visit/orientation/education at 1230. Patient was referred to CR by Dr. PeJenkins Rougeue to S/P Drug Eluting Coronary Artery stent (Z95.5). During orientation advised patient on arrival and appointment times what to wear, what to do before, during and after exercise. Reviewed attendance and class policy.  Pt is scheduled to return Cardiac Rehab on 03/07/22  at 8:15. Pt was advised to come to class 15 minutes before class starts.  Discussed RPE/Dpysnea scales. Patient participated in warm up stretches. Patient was able to complete 6 minute walk test.  Telemetry: NSR with 1st degree block. Patient was measured for the equipment. Discussed equipment safety with patient. Took patient pre-anthropometric measurements. Patient finished visit at 1400.

## 2022-03-07 ENCOUNTER — Encounter (HOSPITAL_COMMUNITY)
Admission: RE | Admit: 2022-03-07 | Discharge: 2022-03-07 | Disposition: A | Discharge status: Home / Self Care | Payer: PPO | Source: Ambulatory Visit | Attending: Cardiovascular Disease | Admitting: Cardiovascular Disease

## 2022-03-07 DIAGNOSIS — Z955 Presence of coronary angioplasty implant and graft: Secondary | ICD-10-CM | POA: Diagnosis not present

## 2022-03-07 NOTE — Progress Notes (Signed)
Daily Session Note  Patient Details  Name: Jeremy [name] MRN: [id number] Date of Birth: 30-Dec-1945 Referring Provider:   Flowsheet Row CARDIAC REHAB PHASE II ORIENTATION from 03/04/2022 in [place]  Referring Provider Dr. Johnsie Cancel       Encounter Date: 03/07/2022  Check In:  Session Check In - 03/07/22 0815       Check-In   Supervising physician immediately available to respond to emergencies [facility] MD immediately available    Physician(s) Dr. Audie Box    Location [facility]    Staff Present Redge Gainer, BS, Exercise Physiologist;Dalton Kris Mouton, MS, ACSM-CEP, Exercise Physiologist;Daphyne Hassell Done, RN, BSN    Virtual Visit No    Medication changes reported     No    Fall or balance concerns reported    No    Tobacco Cessation No Change    Warm-up and Cool-down Performed as group-led instruction    Resistance Training Performed Yes    VAD Patient? No    PAD/SET Patient? No      Pain Assessment   Currently in Pain? No/denies    Pain Score 0-No pain    Multiple Pain Sites No             Capillary Blood Glucose: No results found for this or any previous visit (from the past 24 hour(s)).    Social History   Tobacco Use  Smoking Status Former   Packs/day: 2.00   Years: 20.00   Total pack years: 40.00   Types: Cigarettes   Quit date: 05/23/1988   Years since quitting: 33.8  Smokeless Tobacco Never  Tobacco Comments   smoked 2 packs per day for 20 years    Goals Met:  Independence with exercise equipment Exercise tolerated well No report of concerns or symptoms today Strength training completed today  Goals Unmet:  Not Applicable  Comments: check out 0915   Dr. Carlyle Dolly is Medical Director for [facility] [place]

## 2022-03-09 ENCOUNTER — Encounter (HOSPITAL_COMMUNITY): Payer: PPO

## 2022-03-11 ENCOUNTER — Encounter (HOSPITAL_COMMUNITY)
Admission: RE | Admit: 2022-03-11 | Discharge: 2022-03-11 | Disposition: A | Discharge status: Home / Self Care | Payer: PPO | Source: Ambulatory Visit | Attending: Cardiovascular Disease | Admitting: Cardiovascular Disease

## 2022-03-11 DIAGNOSIS — Z955 Presence of coronary angioplasty implant and graft: Secondary | ICD-10-CM

## 2022-03-11 NOTE — Progress Notes (Signed)
Daily Session Note  Patient Details  Name: Jeremy [name] MRN: [id number] Date of Birth: 1946-02-15 Referring Provider:   Flowsheet Row CARDIAC REHAB PHASE II ORIENTATION from 03/04/2022 in [place]  Referring Provider Dr. Johnsie Cancel       Encounter Date: 03/11/2022  Check In:  Session Check In - 03/11/22 0815       Check-In   Supervising physician immediately available to respond to emergencies [facility] MD immediately available    Physician(s) Dr. Dellia Cloud    Location [facility]    Staff Present Hoy Register, MS, ACSM-CEP, Exercise Physiologist;Daphyne Hassell Done, RN, BSN    Virtual Visit No    Medication changes reported     No    Fall or balance concerns reported    No    Tobacco Cessation No Change    Warm-up and Cool-down Performed as group-led instruction    Resistance Training Performed Yes    VAD Patient? No    PAD/SET Patient? No      Pain Assessment   Currently in Pain? No/denies    Pain Score 0-No pain    Multiple Pain Sites No             Capillary Blood Glucose: No results found for this or any previous visit (from the past 24 hour(s)).    Social History   Tobacco Use  Smoking Status Former   Packs/day: 2.00   Years: 20.00   Total pack years: 40.00   Types: Cigarettes   Quit date: 05/23/1988   Years since quitting: 33.8  Smokeless Tobacco Never  Tobacco Comments   smoked 2 packs per day for 20 years    Goals Met:  Independence with exercise equipment Exercise tolerated well No report of concerns or symptoms today Strength training completed today  Goals Unmet:  Not Applicable  Comments: checkout time is 0915   Dr. Carlyle Dolly is Medical Director for [place]

## 2022-03-14 ENCOUNTER — Encounter (HOSPITAL_COMMUNITY)
Admission: RE | Admit: 2022-03-14 | Discharge: 2022-03-14 | Disposition: A | Discharge status: Home / Self Care | Payer: PPO | Source: Ambulatory Visit | Attending: Cardiovascular Disease | Admitting: Cardiovascular Disease

## 2022-03-14 VITALS — Wt 216.9 lb

## 2022-03-14 DIAGNOSIS — Z955 Presence of coronary angioplasty implant and graft: Secondary | ICD-10-CM

## 2022-03-14 NOTE — Progress Notes (Signed)
Daily Session Note  Patient Details  Name: MINOR IDEN MRN: [id number] Date of Birth: Jan 01, 1946 Referring Provider:   Flowsheet Row CARDIAC REHAB PHASE II ORIENTATION from 03/04/2022 in [place]  Referring Provider Dr. Johnsie Cancel       Encounter Date: 03/14/2022  Check In:  Session Check In - 03/14/22 0815       Check-In   Supervising physician immediately available to respond to emergencies [facility] MD immediately available    Physician(s) Dr. Harl Bowie    Location [facility]    Staff Present Hoy Register, MS, ACSM-CEP, Exercise Physiologist;Daphyne Hassell Done, RN, BSN;Jakarius Flamenco Otho Ket, BS, Exercise Physiologist    Virtual Visit No    Medication changes reported     No    Fall or balance concerns reported    No    Tobacco Cessation No Change    Warm-up and Cool-down Performed as group-led instruction    Resistance Training Performed Yes    VAD Patient? No    PAD/SET Patient? No      Pain Assessment   Currently in Pain? No/denies    Pain Score 0-No pain    Multiple Pain Sites No             Capillary Blood Glucose: No results found for this or any previous visit (from the past 24 hour(s)).    Social History   Tobacco Use  Smoking Status Former   Packs/day: 2.00   Years: 20.00   Total pack years: 40.00   Types: Cigarettes   Quit date: 05/23/1988   Years since quitting: 33.8  Smokeless Tobacco Never  Tobacco Comments   smoked 2 packs per day for 20 years    Goals Met:  Independence with exercise equipment Exercise tolerated well No report of concerns or symptoms today Strength training completed today  Goals Unmet:  Not Applicable  Comments: check out 0915   Dr. Carlyle Dolly is Medical Director for [place]

## 2022-03-16 ENCOUNTER — Encounter (HOSPITAL_COMMUNITY)
Admission: RE | Admit: 2022-03-16 | Discharge: 2022-03-16 | Disposition: A | Discharge status: Home / Self Care | Payer: PPO | Source: Ambulatory Visit | Attending: Cardiovascular Disease | Admitting: Cardiovascular Disease

## 2022-03-16 DIAGNOSIS — Z955 Presence of coronary angioplasty implant and graft: Secondary | ICD-10-CM | POA: Diagnosis not present

## 2022-03-16 NOTE — Progress Notes (Signed)
Daily Session Note  Patient Details  Name: Jeremy [name] MRN: [id number] Date of Birth: Apr 13, 1946 Referring Provider:   Flowsheet Row CARDIAC REHAB PHASE II ORIENTATION from 03/04/2022 in [place]  Referring Provider Dr. Johnsie Cancel       Encounter Date: 03/16/2022  Check In:  Session Check In - 03/16/22 0815       Check-In   Supervising physician immediately available to respond to emergencies [facility] MD immediately available    Physician(s) Dr. Marlou Porch    Location [facility]    Staff Present Hoy Register MHA, MS, ACSM-CEP;Madelyn Flavors, RN, BSN;Heather Otho Ket, BS, Exercise Physiologist    Virtual Visit No    Medication changes reported     No    Fall or balance concerns reported    No    Tobacco Cessation No Change    Warm-up and Cool-down Performed as group-led instruction    Resistance Training Performed Yes    VAD Patient? No    PAD/SET Patient? No      Pain Assessment   Currently in Pain? No/denies    Pain Score 0-No pain    Multiple Pain Sites No             Capillary Blood Glucose: No results found for this or any previous visit (from the past 24 hour(s)).    Social History   Tobacco Use  Smoking Status Former   Packs/day: 2.00   Years: 20.00   Total pack years: 40.00   Types: Cigarettes   Quit date: 05/23/1988   Years since quitting: 33.8  Smokeless Tobacco Never  Tobacco Comments   smoked 2 packs per day for 20 years    Goals Met:  Independence with exercise equipment Exercise tolerated well No report of concerns or symptoms today Strength training completed today  Goals Unmet:  Not Applicable  Comments: checkout time is 0915   Dr. Carlyle Dolly is Medical Director for [place]

## 2022-03-18 ENCOUNTER — Encounter (HOSPITAL_COMMUNITY)
Admission: RE | Admit: 2022-03-18 | Discharge: 2022-03-18 | Disposition: A | Discharge status: Home / Self Care | Payer: PPO | Source: Ambulatory Visit | Attending: Cardiovascular Disease | Admitting: Cardiovascular Disease

## 2022-03-18 DIAGNOSIS — Z955 Presence of coronary angioplasty implant and graft: Secondary | ICD-10-CM

## 2022-03-18 NOTE — Progress Notes (Signed)
Daily Session Note  Patient Details  Name: Jeremy [name] MRN: [id number] Date of Birth: Sep 08, 1945 Referring Provider:   Flowsheet Row CARDIAC REHAB PHASE II ORIENTATION from 03/04/2022 in [place]  Referring Provider Dr. Johnsie Cancel       Encounter Date: 03/18/2022  Check In:  Session Check In - 03/18/22 0811       Check-In   Supervising physician immediately available to respond to emergencies [facility] MD immediately available    Physician(s) Domenic Polite    Location [facility]    Staff Present Hoy Register MHA, MS, ACSM-CEP;Timithy Arons Wynetta Emery, RN, BSN;Heather Mel Almond, BS, Exercise Physiologist    Virtual Visit No    Medication changes reported     No    Fall or balance concerns reported    No    Tobacco Cessation No Change    Warm-up and Cool-down Performed as group-led instruction    Resistance Training Performed Yes    VAD Patient? No    PAD/SET Patient? No      Pain Assessment   Currently in Pain? No/denies    Pain Score 0-No pain    Multiple Pain Sites No             Capillary Blood Glucose: No results found for this or any previous visit (from the past 24 hour(s)).    Social History   Tobacco Use  Smoking Status Former   Packs/day: 2.00   Years: 20.00   Total pack years: 40.00   Types: Cigarettes   Quit date: 05/23/1988   Years since quitting: 33.8  Smokeless Tobacco Never  Tobacco Comments   smoked 2 packs per day for 20 years    Goals Met:  Independence with exercise equipment Exercise tolerated well No report of concerns or symptoms today Strength training completed today  Goals Unmet:  Not Applicable  Comments: Check out 915.   Dr. Carlyle Dolly is Medical Director for [facility]

## 2022-03-21 ENCOUNTER — Encounter (HOSPITAL_COMMUNITY)
Admission: RE | Admit: 2022-03-21 | Discharge: 2022-03-21 | Disposition: A | Discharge status: Home / Self Care | Payer: PPO | Source: Ambulatory Visit | Attending: Cardiovascular Disease | Admitting: Cardiovascular Disease

## 2022-03-21 DIAGNOSIS — Z955 Presence of coronary angioplasty implant and graft: Secondary | ICD-10-CM | POA: Diagnosis not present

## 2022-03-21 NOTE — Progress Notes (Signed)
Daily Session Note  Patient Details  Name: KAIGE WHISTLER MRN: [id number] Date of Birth: 03-12-1946 Referring Provider:   Flowsheet Row CARDIAC REHAB PHASE II ORIENTATION from 03/04/2022 in [place]  Referring Provider Dr. Johnsie Cancel       Encounter Date: 03/21/2022  Check In:  Session Check In - 03/21/22 0815       Check-In   Supervising physician immediately available to respond to emergencies [facility] MD immediately available    Physician(s) Dr. Domenic Polite    Location [facility]    Staff Present Hoy Register MHA, MS, ACSM-CEP;Debra Wynetta Emery, RN, BSN;Heather Mel Almond, BS, Exercise Physiologist;Dora Simeone, RN    Virtual Visit No    Medication changes reported     No    Fall or balance concerns reported    No    Tobacco Cessation No Change    Warm-up and Cool-down Performed as group-led instruction    Resistance Training Performed Yes    VAD Patient? No    PAD/SET Patient? No      Pain Assessment   Currently in Pain? Yes    Pain Score 7     Pain Location Hip    Pain Orientation Right;Left    Pain Frequency Occasional    Pain Relieving Factors stopped the treadmill and moved to  the nu-step    Multiple Pain Sites No             Capillary Blood Glucose: No results found for this or any previous visit (from the past 24 hour(s)).    Social History   Tobacco Use  Smoking Status Former   Packs/day: 2.00   Years: 20.00   Total pack years: 40.00   Types: Cigarettes   Quit date: 05/23/1988   Years since quitting: 33.8  Smokeless Tobacco Never  Tobacco Comments   smoked 2 packs per day for 20 years    Goals Met:  Independence with exercise equipment Exercise tolerated well No report of concerns or symptoms today Strength training completed today  Goals Unmet:  Not Applicable  Comments: checked out @ 9:15am   Dr. Carlyle Dolly is Medical Director for [place]

## 2022-03-23 ENCOUNTER — Encounter (HOSPITAL_COMMUNITY)
Admission: RE | Admit: 2022-03-23 | Discharge: 2022-03-23 | Disposition: A | Discharge status: Home / Self Care | Payer: PPO | Source: Ambulatory Visit | Attending: Cardiovascular Disease | Admitting: Cardiovascular Disease

## 2022-03-23 DIAGNOSIS — Z955 Presence of coronary angioplasty implant and graft: Secondary | ICD-10-CM | POA: Insufficient documentation

## 2022-03-23 NOTE — Progress Notes (Signed)
Daily Session Note  Patient Details  Name: Jeremy [name] MRN: [id number] Date of Birth: June 27, 1945 Referring Provider:   Flowsheet Row CARDIAC REHAB PHASE II ORIENTATION from 03/04/2022 in [place]  Referring Provider Dr. Johnsie Cancel       Encounter Date: 03/23/2022  Check In:  Session Check In - 03/23/22 0815       Check-In   Supervising physician immediately available to respond to emergencies [facility] MD immediately available    Physician(s) Dr. Domenic Polite    Location [facility]    Staff Present Leana Roe, BS, Exercise Physiologist;Dalton Sherrie George, MS, ACSM-CEP    Virtual Visit No    Medication changes reported     No    Fall or balance concerns reported    No    Tobacco Cessation No Change    Warm-up and Cool-down Performed as group-led instruction    Resistance Training Performed Yes    VAD Patient? No    PAD/SET Patient? No      Pain Assessment   Currently in Pain? No/denies    Pain Score 0-No pain    Multiple Pain Sites No             Capillary Blood Glucose: No results found for this or any previous visit (from the past 24 hour(s)).    Social History   Tobacco Use  Smoking Status Former   Packs/day: 2.00   Years: 20.00   Total pack years: 40.00   Types: Cigarettes   Quit date: 05/23/1988   Years since quitting: 33.8  Smokeless Tobacco Never  Tobacco Comments   smoked 2 packs per day for 20 years    Goals Met:  Independence with exercise equipment Exercise tolerated well No report of concerns or symptoms today Strength training completed today  Goals Unmet:    Comments: check out 0915   Dr. Carlyle Dolly is Medical Director for [place]

## 2022-03-25 ENCOUNTER — Encounter (HOSPITAL_COMMUNITY)
Admission: RE | Admit: 2022-03-25 | Discharge: 2022-03-25 | Disposition: A | Discharge status: Home / Self Care | Payer: PPO | Source: Ambulatory Visit | Attending: Cardiovascular Disease | Admitting: Cardiovascular Disease

## 2022-03-25 DIAGNOSIS — Z955 Presence of coronary angioplasty implant and graft: Secondary | ICD-10-CM

## 2022-03-25 NOTE — Progress Notes (Signed)
Daily Session Note  Patient Details  Name: Jeremy [name] MRN: [id number] Date of Birth: 12/22/1945 Referring Provider:   Flowsheet Row CARDIAC REHAB PHASE II ORIENTATION from 03/04/2022 in [place]  Referring Provider Dr. Johnsie Cancel       Encounter Date: 03/25/2022  Check In:  Session Check In - 03/25/22 0807       Check-In   Supervising physician immediately available to respond to emergencies [facility] MD immediately available    Physician(s) Dr Harl Bowie    Location [facility]    Staff Present Leana Roe, BS, Exercise Physiologist;Harvest Deist Hassell Done, RN, BSN    Virtual Visit No    Medication changes reported     No    Fall or balance concerns reported    No    Tobacco Cessation No Change    Warm-up and Cool-down Performed as group-led instruction    Resistance Training Performed Yes    VAD Patient? No    PAD/SET Patient? No      Pain Assessment   Currently in Pain? No/denies    Pain Score 0-No pain    Multiple Pain Sites No             Capillary Blood Glucose: No results found for this or any previous visit (from the past 24 hour(s)).    Social History   Tobacco Use  Smoking Status Former   Packs/day: 2.00   Years: 20.00   Total pack years: 40.00   Types: Cigarettes   Quit date: 05/23/1988   Years since quitting: 33.8  Smokeless Tobacco Never  Tobacco Comments   smoked 2 packs per day for 20 years    Goals Met:  Independence with exercise equipment Exercise tolerated well No report of concerns or symptoms today Strength training completed today  Goals Unmet:  Not Applicable  Comments: Checkout at 0915.   Dr. Carlyle Dolly is Medical Director for [facility]

## 2022-03-25 NOTE — Progress Notes (Signed)
Daily Session Note  Patient Details  Name: ROLLAND STEINERT MRN: [id number] Date of Birth: Jun 11, 1945 Referring Provider:   Flowsheet Row CARDIAC REHAB PHASE II ORIENTATION from 03/04/2022 in [place]  Referring Provider Dr. Johnsie Cancel       Encounter Date: 03/25/2022  Check In:  Session Check In - 03/25/22 0804       Check-In   Supervising physician immediately available to respond to emergencies [facility] MD immediately available    Physician(s) Dr. Zandra Abts    Location [facility]    Staff Present Aundra Dubin, RN, Joanette Gula, RN, BSN;Heather Mel Almond, BS, Exercise Physiologist    Virtual Visit No    Medication changes reported     No    Fall or balance concerns reported    No    Tobacco Cessation No Change    Warm-up and Cool-down Performed as group-led instruction    Resistance Training Performed Yes    VAD Patient? No    PAD/SET Patient? No      Pain Assessment   Currently in Pain? No/denies    Pain Score 0-No pain    Multiple Pain Sites No             Capillary Blood Glucose: No results found for this or any previous visit (from the past 24 hour(s)).    Social History   Tobacco Use  Smoking Status Former   Packs/day: 2.00   Years: 20.00   Total pack years: 40.00   Types: Cigarettes   Quit date: 05/23/1988   Years since quitting: 33.8  Smokeless Tobacco Never  Tobacco Comments   smoked 2 packs per day for 20 years    Goals Met:  Independence with exercise equipment Exercise tolerated well No report of concerns or symptoms today Strength training completed today  Goals Unmet:  Not Applicable  Comments: Check out 915.   Dr. Carlyle Dolly is Medical Director for [facility]

## 2022-03-28 ENCOUNTER — Encounter (HOSPITAL_COMMUNITY)
Admission: RE | Admit: 2022-03-28 | Discharge: 2022-03-28 | Disposition: A | Discharge status: Home / Self Care | Payer: PPO | Source: Ambulatory Visit | Attending: Cardiovascular Disease | Admitting: Cardiovascular Disease

## 2022-03-28 VITALS — Wt 210.5 lb

## 2022-03-28 DIAGNOSIS — Z955 Presence of coronary angioplasty implant and graft: Secondary | ICD-10-CM | POA: Diagnosis not present

## 2022-03-28 NOTE — Progress Notes (Signed)
Daily Session Note  Patient Details  Name: Jeremy [name] MRN: [id number] Date of Birth: 1946/03/13 Referring Provider:   Flowsheet Row CARDIAC REHAB PHASE II ORIENTATION from 03/04/2022 in [place]  Referring Provider Dr. Johnsie Cancel       Encounter Date: 03/28/2022  Check In:  Session Check In - 03/28/22 0815       Check-In   Supervising physician immediately available to respond to emergencies [facility] MD immediately available    Physician(s) Dr. Dellia Cloud    Location [facility]    Staff Present Leana Roe, BS, Exercise Physiologist;Roselee Tayloe Sherrie George, MS, ACSM-CEP;Madelyn Flavors, RN, BSN    Virtual Visit No    Medication changes reported     No    Fall or balance concerns reported    No    Tobacco Cessation No Change    Warm-up and Cool-down Performed as group-led instruction    Resistance Training Performed Yes    VAD Patient? No    PAD/SET Patient? No      Pain Assessment   Currently in Pain? No/denies    Pain Score 0-No pain    Multiple Pain Sites No             Capillary Blood Glucose: No results found for this or any previous visit (from the past 24 hour(s)).    Social History   Tobacco Use  Smoking Status Former   Packs/day: 2.00   Years: 20.00   Total pack years: 40.00   Types: Cigarettes   Quit date: 05/23/1988   Years since quitting: 33.8  Smokeless Tobacco Never  Tobacco Comments   smoked 2 packs per day for 20 years    Goals Met:  Independence with exercise equipment Exercise tolerated well No report of concerns or symptoms today Strength training completed today  Goals Unmet:  Not Applicable  Comments: checkout time is 0915   Dr. Carlyle Dolly is Medical Director for [place]

## 2022-03-28 NOTE — Progress Notes (Signed)
Daily Session Note  Patient Details  Name: Jeremy [name] MRN: [id number] Date of Birth: 1945-12-29 Referring Provider:   Flowsheet Row CARDIAC REHAB PHASE II ORIENTATION from 03/04/2022 in [place]  Referring Provider Dr. Johnsie Cancel       Encounter Date: 03/28/2022  Check In:  Session Check In - 03/28/22 0815       Check-In   Supervising physician immediately available to respond to emergencies [facility] MD immediately available    Physician(s) Dr. Dellia Cloud    Location [facility]    Staff Present Leana Roe, BS, Exercise Physiologist;Tomy Khim Sherrie George, MS, ACSM-CEP;Madelyn Flavors, RN, BSN    Virtual Visit No    Medication changes reported     No    Fall or balance concerns reported    No    Tobacco Cessation No Change    Warm-up and Cool-down Performed as group-led instruction    Resistance Training Performed Yes    VAD Patient? No    PAD/SET Patient? No      Pain Assessment   Currently in Pain? No/denies    Pain Score 0-No pain    Multiple Pain Sites No             Capillary Blood Glucose: No results found for this or any previous visit (from the past 24 hour(s)).    Social History   Tobacco Use  Smoking Status Former   Packs/day: 2.00   Years: 20.00   Total pack years: 40.00   Types: Cigarettes   Quit date: 05/23/1988   Years since quitting: 33.8  Smokeless Tobacco Never  Tobacco Comments   smoked 2 packs per day for 20 years    Goals Met:  Independence with exercise equipment Exercise tolerated well No report of concerns or symptoms today Strength training completed today  Goals Unmet:  Not Applicable  Comments: checkout time is 0915   Dr. Carlyle Dolly is Medical Director for [place]

## 2022-03-30 ENCOUNTER — Encounter (HOSPITAL_COMMUNITY)
Admission: RE | Admit: 2022-03-30 | Discharge: 2022-03-30 | Disposition: A | Discharge status: Home / Self Care | Payer: PPO | Source: Ambulatory Visit | Attending: Cardiovascular Disease | Admitting: Cardiovascular Disease

## 2022-03-30 DIAGNOSIS — Z955 Presence of coronary angioplasty implant and graft: Secondary | ICD-10-CM

## 2022-03-30 NOTE — Progress Notes (Signed)
Daily Session Note  Patient Details  Name: EBER FERRUFINO MRN: [id number] Date of Birth: [birth year] Referring Provider:   Flowsheet Row CARDIAC REHAB PHASE II ORIENTATION from 03/04/2022 in [place]  Referring Provider Dr. Johnsie Cancel       Encounter Date: 03/30/2022  Check In:  Session Check In - 03/30/22 0815       Check-In   Supervising physician immediately available to respond to emergencies [facility] MD immediately available    Physician(s) Dr. Dellia Cloud    Location [facility]    Staff Present Leana Roe, BS, Exercise Physiologist;Castiel Lauricella Sherrie George, MS, ACSM-CEP    Virtual Visit No    Medication changes reported     No    Fall or balance concerns reported    No    Tobacco Cessation No Change    Warm-up and Cool-down Performed as group-led instruction    Resistance Training Performed Yes    VAD Patient? No      Pain Assessment   Currently in Pain? No/denies    Pain Score 0-No pain    Multiple Pain Sites No             Capillary Blood Glucose: No results found for this or any previous visit (from the past 24 hour(s)).    Social History   Tobacco Use  Smoking Status Former   Packs/day: 2.00   Years: 20.00   Total pack years: 40.00   Types: Cigarettes   Quit date: 05/23/1988   Years since quitting: 33.8  Smokeless Tobacco Never  Tobacco Comments   smoked 2 packs per day for 20 years    Goals Met:  Independence with exercise equipment Exercise tolerated well No report of concerns or symptoms today Strength training completed today  Goals Unmet:  Not Applicable  Comments: checkout time is 0915   Dr. Carlyle Dolly is Medical Director for [place]

## 2022-03-30 NOTE — Progress Notes (Signed)
Cardiac Individual Treatment Plan  Patient Details  Name: Jeremy [name] MRN: [id number] Date of Birth: Oct 12, 1945 Referring Provider:   Flowsheet Row CARDIAC REHAB PHASE II ORIENTATION from 03/04/2022 in [place]  Referring Provider Dr. Johnsie Cancel       Initial Encounter Date:  Flowsheet Row CARDIAC REHAB PHASE II ORIENTATION from 03/04/2022 in [place]  Date 03/04/22       Visit Diagnosis: S/P drug eluting coronary stent placement  Patient's Home Medications on Admission:  Current Outpatient Medications:    acetaminophen (TYLENOL) 500 MG tablet, Take 500 mg by mouth every 6 (six) hours as needed for mild pain., Disp: , Rfl:    aspirin EC 81 MG EC tablet, Take 1 tablet (81 mg total) by mouth daily., Disp: 90 tablet, Rfl: 3   carvedilol (COREG) 12.5 MG tablet, Take 1 tablet (12.5 mg total) by mouth 2 (two) times daily with a meal., Disp: 180 tablet, Rfl: 3   clopidogrel (PLAVIX) 75 MG tablet, Take 1 tablet by mouth once daily (Patient taking differently: Take 75 mg by mouth every evening.), Disp: 90 tablet, Rfl: 3   divalproex (DEPAKOTE) 500 MG DR tablet, Take 1 tablet (500 mg total) by mouth every 12 (twelve) hours., Disp: 180 tablet, Rfl: 3   gabapentin (NEURONTIN) 300 MG capsule, Take 1 capsule (300 mg total) by mouth 3 (three) times daily., Disp: 90 capsule, Rfl: 6   isosorbide mononitrate (IMDUR) 30 MG 24 hr tablet, Take 1 tablet by mouth once daily (Patient taking differently: Take 30 mg by mouth daily after breakfast.), Disp: 90 tablet, Rfl: 1   losartan (COZAAR) 50 MG tablet, Take 1 tablet (50 mg total) by mouth daily., Disp: 90 tablet, Rfl: 3   nitroGLYCERIN (NITROSTAT) 0.4 MG SL tablet, Place 1 tablet (0.4 mg total) under the tongue every 5 (five) minutes as needed for chest pain., Disp: 25 tablet, Rfl: 3   nystatin (MYCOSTATIN/NYSTOP) powder, Apply 1 Application topically 2 (two) times daily as needed (skin irritation/rash.).,  Disp: , Rfl:    nystatin cream (MYCOSTATIN), Apply 1 Application topically 2 (two) times daily as needed (severe irritation/rash)., Disp: , Rfl:    oxyCODONE (ROXICODONE) 15 MG immediate release tablet, Take 15 mg by mouth every 6 (six) hours as needed for pain., Disp: , Rfl:    pantoprazole (PROTONIX) 40 MG tablet, Take 1 tablet (40 mg total) by mouth daily., Disp: 90 tablet, Rfl: 3   RESTASIS MULTIDOSE 0.05 % ophthalmic emulsion, Place 1 drop into both eyes 2 (two) times daily as needed (dry/irritated eyes.)., Disp: , Rfl:    rosuvastatin (CRESTOR) 40 MG tablet, Take 40 mg by mouth at bedtime., Disp: , Rfl:    XIIDRA 5 % SOLN, Place 1 drop into both eyes daily as needed (dry eye)., Disp: , Rfl:   Past Medical History: Past Medical History:  Diagnosis Date   Arthritis    Basal cell carcinoma 02/11/2021   nod- left dorsal hand (EXC)   CAD (coronary artery disease)    a. s/p prior stenting of LAD and RCA b. 03/2019: NSTEMI and required PTCA/DESx1 to the LAD and successful scoring balloon angioplasty in the previously stented segment of LCx   Carotid stenosis, asymptomatic, right    Carpal tunnel syndrome    bilateral   Coronary artery disease    GERD (gastroesophageal reflux disease)    History of kidney stones    Hx of colonic polyp    Hypercholesterolemia    Hypertension  Myocardial infarction (HCC)    hx of   Obesity    SCC (squamous cell carcinoma) 02/11/2021   in situ- right temple ([id number])   Squamous cell carcinoma of skin 08/09/2016   well differentiated on right outer eye - tx p bx   Stroke ([place])     Tobacco Use: Social History   Tobacco Use  Smoking Status Former   Packs/day: 2.00   Years: 20.00   Total pack years: 40.00   Types: Cigarettes   Quit date: 05/23/1988   Years since quitting: 33.8  Smokeless Tobacco Never  Tobacco Comments   smoked 2 packs per day for 20 years    Labs: Review Flowsheet  More data exists      Latest Ref Rng & Units 03/26/2019  09/20/2020 11/13/2021 11/14/2021 11/15/2021  Labs for ITP Cardiac and Pulmonary Rehab  Cholestrol 0 - 200 mg/dL 87  112  - 124  -  LDL (calc) 0 - 99 mg/dL 36  44  - 59  -  HDL-C >40 mg/dL 32  33  - 32  -  Trlycerides <150 mg/dL 95  177  - 164  -  Hemoglobin A1c 4.8 - 5.6 % - - 5.4  - -  PH, Arterial 7.35 - 7.45 - - - - 7.354   PCO2 arterial 32 - 48 mmHg - - - - 43.4   Bicarbonate 20.0 - 28.0 mmol/L - - - - 24.1   TCO2 22 - 32 mmol/L - - - - 25   Acid-base deficit 0.0 - 2.0 mmol/L - - - - 1.0   O2 Saturation % - - - - 97     Capillary Blood Glucose: Lab Results  Component Value Date   GLUCAP 93 11/16/2021   GLUCAP 78 11/15/2021   GLUCAP 75 11/15/2021   GLUCAP 87 06/26/2019   GLUCAP 130 (H) 01/30/2019     Exercise Target Goals: Exercise Program Goal: Individual exercise prescription set using results from initial 6 min walk test and THRR while considering  patient's activity barriers and safety.   Exercise Prescription Goal: Starting with aerobic activity 30 plus minutes a day, 3 days per week for initial exercise prescription. Provide home exercise prescription and guidelines that participant acknowledges understanding prior to discharge.  Activity Barriers & Risk Stratification:  Activity Barriers & Cardiac Risk Stratification - 03/04/22 1257       Activity Barriers & Cardiac Risk Stratification   Activity Barriers Arthritis;Back Problems;Shortness of Breath    Cardiac Risk Stratification High             6 Minute Walk:  6 Minute Walk     Row Name 03/04/22 1355         6 Minute Walk   Phase Initial     Distance 1200 feet     Walk Time 6 minutes     # of Rest Breaks 0     MPH 2.27     METS 2.05     RPE 13     VO2 Peak 7.19     Symptoms Yes (comment)     Comments Bilateral hip pain while walking (6/10)     Resting HR 61 bpm     Resting BP 100/50     Resting Oxygen Saturation  94 %     Exercise Oxygen Saturation  during 6 min walk 96 %     Max Ex. HR 88  bpm     Max Ex. BP 140/58  2 Minute Post BP 120/52              Oxygen Initial Assessment:   Oxygen Re-Evaluation:   Oxygen Discharge (Final Oxygen Re-Evaluation):   Initial Exercise Prescription:  Initial Exercise Prescription - 03/04/22 1300       Date of Initial Exercise RX and Referring Provider   Date 03/04/22    Referring Provider Dr. Johnsie Cancel      Treadmill   MPH 1.3    Grade 0    Minutes 17      NuStep   Level 1    SPM 60    Minutes 22      Prescription Details   Frequency (times per week) 3    Duration Progress to 30 minutes of continuous aerobic without signs/symptoms of physical distress      Intensity   THRR 40-80% of Max Heartrate 58-116    Ratings of Perceived Exertion 11-13      Resistance Training   Training Prescription Yes    Weight 3    Reps 10-15             Perform Capillary Blood Glucose checks as needed.  Exercise Prescription Changes:   Exercise Prescription Changes     Row Name 03/14/22 1000 03/28/22 1000           Response to Exercise   Blood Pressure (Admit) 150/60 120/60      Blood Pressure (Exercise) 170/68 134/54      Blood Pressure (Exit) 148/72 110/60      Heart Rate (Admit) 62 bpm 53 bpm      Heart Rate (Exercise) 78 bpm 90 bpm      Heart Rate (Exit) 71 bpm 59 bpm      Rating of Perceived Exertion (Exercise) 11 12      Duration Continue with 30 min of aerobic exercise without signs/symptoms of physical distress. Continue with 30 min of aerobic exercise without signs/symptoms of physical distress.      Intensity THRR unchanged THRR unchanged        Progression   Progression Continue to progress workloads to maintain intensity without signs/symptoms of physical distress. Continue to progress workloads to maintain intensity without signs/symptoms of physical distress.        Resistance Training   Training Prescription Yes Yes      Weight 4 5      Reps 10-15 10-15      Time 10 Minutes 10 Minutes         Treadmill   MPH 2.2 --      Grade 0 --      Minutes 17 --      METs 2.69 --        NuStep   Level 1 2      SPM 70 79      Minutes 22 39      METs 1.96 2.09               Exercise Comments:   Exercise Goals and Review:   Exercise Goals     Row Name 03/04/22 1357 03/28/22 1040           Exercise Goals   Increase Physical Activity Yes Yes      Intervention Provide advice, education, support and counseling about physical activity/exercise needs.;Develop an individualized exercise prescription for aerobic and resistive training based on initial evaluation findings, risk stratification, comorbidities and participant's personal goals. Provide advice, education, support and counseling about physical  activity/exercise needs.;Develop an individualized exercise prescription for aerobic and resistive training based on initial evaluation findings, risk stratification, comorbidities and participant's personal goals.      Expected Outcomes Short Term: Attend rehab on a regular basis to increase amount of physical activity.;Long Term: Add in home exercise to make exercise part of routine and to increase amount of physical activity.;Long Term: Exercising regularly at least 3-5 days a week. Short Term: Attend rehab on a regular basis to increase amount of physical activity.;Long Term: Add in home exercise to make exercise part of routine and to increase amount of physical activity.;Long Term: Exercising regularly at least 3-5 days a week.      Increase Strength and Stamina Yes Yes      Intervention Provide advice, education, support and counseling about physical activity/exercise needs.;Develop an individualized exercise prescription for aerobic and resistive training based on initial evaluation findings, risk stratification, comorbidities and participant's personal goals. Provide advice, education, support and counseling about physical activity/exercise needs.;Develop an individualized exercise  prescription for aerobic and resistive training based on initial evaluation findings, risk stratification, comorbidities and participant's personal goals.      Expected Outcomes Short Term: Increase workloads from initial exercise prescription for resistance, speed, and METs.;Short Term: Perform resistance training exercises routinely during rehab and add in resistance training at home;Long Term: Improve cardiorespiratory fitness, muscular endurance and strength as measured by increased METs and functional capacity (6MWT) Short Term: Increase workloads from initial exercise prescription for resistance, speed, and METs.;Short Term: Perform resistance training exercises routinely during rehab and add in resistance training at home;Long Term: Improve cardiorespiratory fitness, muscular endurance and strength as measured by increased METs and functional capacity (6MWT)      Able to understand and use rate of perceived exertion (RPE) scale Yes Yes      Intervention Provide education and explanation on how to use RPE scale Provide education and explanation on how to use RPE scale      Expected Outcomes Long Term:  Able to use RPE to guide intensity level when exercising independently;Short Term: Able to use RPE daily in rehab to express subjective intensity level Long Term:  Able to use RPE to guide intensity level when exercising independently;Short Term: Able to use RPE daily in rehab to express subjective intensity level      Knowledge and understanding of Target Heart Rate Range (THRR) Yes Yes      Intervention Provide education and explanation of THRR including how the numbers were predicted and where they are located for reference Provide education and explanation of THRR including how the numbers were predicted and where they are located for reference      Expected Outcomes Short Term: Able to state/look up THRR;Short Term: Able to use daily as guideline for intensity in rehab;Long Term: Able to use THRR to  govern intensity when exercising independently Short Term: Able to state/look up THRR;Short Term: Able to use daily as guideline for intensity in rehab;Long Term: Able to use THRR to govern intensity when exercising independently      Able to check pulse independently Yes Yes      Intervention Provide education and demonstration on how to check pulse in carotid and radial arteries.;Review the importance of being able to check your own pulse for safety during independent exercise Provide education and demonstration on how to check pulse in carotid and radial arteries.;Review the importance of being able to check your own pulse for safety during independent exercise  Expected Outcomes Short Term: Able to explain why pulse checking is important during independent exercise;Long Term: Able to check pulse independently and accurately Short Term: Able to explain why pulse checking is important during independent exercise;Long Term: Able to check pulse independently and accurately      Understanding of Exercise Prescription Yes Yes      Intervention Provide education, explanation, and written materials on patient's individual exercise prescription Provide education, explanation, and written materials on patient's individual exercise prescription      Expected Outcomes Short Term: Able to explain program exercise prescription;Long Term: Able to explain home exercise prescription to exercise independently Short Term: Able to explain program exercise prescription;Long Term: Able to explain home exercise prescription to exercise independently               Exercise Goals Re-Evaluation :  Exercise Goals Re-Evaluation     Row Name 03/28/22 1040             Exercise Goal Re-Evaluation   Exercise Goals Review Increase Physical Activity;Increase Strength and Stamina;Able to understand and use rate of perceived exertion (RPE) scale;Knowledge and understanding of Target Heart Rate Range (THRR);Able to check  pulse independently;Understanding of Exercise Prescription       Comments Pt has completed 10 sessions of cardiac rehab. He is increasing his workload on the stepper. He was recently moves from the treadmill to do the stepper for both sessions due to having increased hip pain when walking for long period of time. He is currently exercising at 2.09 METs on the stepper. Will continue to monitor and progress as able.       Expected Outcomes Through exercise at rehab and home, the patient will meet their stated goals.                 Discharge Exercise Prescription (Final Exercise Prescription Changes):  Exercise Prescription Changes - 03/28/22 1000       Response to Exercise   Blood Pressure (Admit) 120/60    Blood Pressure (Exercise) 134/54    Blood Pressure (Exit) 110/60    Heart Rate (Admit) 53 bpm    Heart Rate (Exercise) 90 bpm    Heart Rate (Exit) 59 bpm    Rating of Perceived Exertion (Exercise) 12    Duration Continue with 30 min of aerobic exercise without signs/symptoms of physical distress.    Intensity THRR unchanged      Progression   Progression Continue to progress workloads to maintain intensity without signs/symptoms of physical distress.      Resistance Training   Training Prescription Yes    Weight 5    Reps 10-15    Time 10 Minutes      NuStep   Level 2    SPM 79    Minutes 39    METs 2.09             Nutrition:  Target Goals: Understanding of nutrition guidelines, daily intake of sodium '1500mg'$ , cholesterol '200mg'$ , calories 30% from fat and 7% or less from saturated fats, daily to have 5 or more servings of fruits and vegetables.  Biometrics:  Pre Biometrics - 03/04/22 1358       Pre Biometrics   Height '5\' 7"'$  (1.702 m)    Weight 96.7 kg    Waist Circumference 47 inches    Hip Circumference 42 inches    Waist to Hip Ratio 1.12 %    BMI (Calculated) 33.38    Triceps Skinfold 18 mm    %  Body Fat 34.1 %    Grip Strength 38.36 kg     Flexibility 0 in    Single Leg Stand 0 seconds              Nutrition Therapy Plan and Nutrition Goals:  Nutrition Therapy & Goals - 03/04/22 1339       Personal Nutrition Goals   Comments Patient scored 88 on his diet assessment. Handout provided and explained regarding healthier choices. He and his wife verbalized understanding. They are interested in meeting with a dietician. A referral will be made. We offer 2 educational sessions regarding heart healthy nutrition with handouts.      Intervention Plan   Intervention Nutrition handout(s) given to patient.    Expected Outcomes Short Term Goal: Understand basic principles of dietary content, such as calories, fat, sodium, cholesterol and nutrients.             Nutrition Assessments:  Nutrition Assessments - 03/04/22 1338       MEDFICTS Scores   Pre Score 88            MEDIFICTS Score Key: ?70 Need to make dietary changes  40-70 Heart Healthy Diet ? 40 Therapeutic Level Cholesterol Diet   Picture Your Plate Scores: <35 Unhealthy dietary pattern with much room for improvement. 41-50 Dietary pattern unlikely to meet recommendations for good health and room for improvement. 51-60 More healthful dietary pattern, with some room for improvement.  >60 Healthy dietary pattern, although there may be some specific behaviors that could be improved.    Nutrition Goals Re-Evaluation:   Nutrition Goals Discharge (Final Nutrition Goals Re-Evaluation):   Psychosocial: Target Goals: Acknowledge presence or absence of significant depression and/or stress, maximize coping skills, provide positive support system. Participant is able to verbalize types and ability to use techniques and skills needed for reducing stress and depression.  Initial Review & Psychosocial Screening:  Initial Psych Review & Screening - 03/04/22 1346       Initial Review   Current issues with None Identified;Current Psychotropic Meds      Family  Dynamics   Good Support System? Yes      Barriers   Psychosocial barriers to participate in program There are no identifiable barriers or psychosocial needs.;The patient should benefit from training in stress management and relaxation.      Screening Interventions   Interventions Encouraged to exercise;Provide feedback about the scores to participant    Expected Outcomes Short Term goal: Identification and review with participant of any Quality of Life or Depression concerns found by scoring the questionnaire.             Quality of Life Scores:  Scores of 19 and below usually indicate a poorer quality of life in these areas.  A difference of  2-3 points is a clinically meaningful difference.  A difference of 2-3 points in the total score of the Quality of Life Index has been associated with significant improvement in overall quality of life, self-image, physical symptoms, and general health in studies assessing change in quality of life.  PHQ-9: Review Flowsheet       03/04/2022 01/06/2022  Depression screen PHQ 2/9  Decreased Interest 1 0  Down, Depressed, Hopeless 0 0  PHQ - 2 Score 1 0  Altered sleeping 1 -  Tired, decreased energy 3 -  Change in appetite 3 -  Feeling bad or failure about yourself  0 -  Trouble concentrating 1 -  Moving slowly or fidgety/restless  0 -  Suicidal thoughts 0 -  PHQ-9 Score 9 -  Difficult doing work/chores Somewhat difficult -   Interpretation of Total Score  Total Score Depression Severity:  1-4 = Minimal depression, 5-9 = Mild depression, 10-14 = Moderate depression, 15-19 = Moderately severe depression, 20-27 = Severe depression   Psychosocial Evaluation and Intervention:  Psychosocial Evaluation - 03/04/22 1412       Psychosocial Evaluation & Interventions   Interventions Stress management education;Relaxation education;Encouraged to exercise with the program and follow exercise prescription    Comments Patient has no psychosocial  barriers identified at his orientation visit. His PHQ-9 score was 9 mainly due to sleeping too much and having little energy. His wife was with him today and helped him answer questions. They have been married for 3 years. Patient has had 2 CVA's effecting his vision and his mood. His wife reports that after his 2nd CVA, he became combative and aggressive. They put him on Depakote to manage his behaviors. She says the medication has really helped but she thinks he hallucinates at times due to the medication. Advised her to talk to his pcp about the hallucinations. Patient is very pleasant today and seems to be motivated to do the program. 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 His wife reports that some days he does not feel good and sleeps most of the day. She also says he has a poor appetite. Patient denies any depression or anxiety. Advised his wife she needed to tell his PCP about him sleeping too much and his appetite. She verbalized understanding. Patient has a good support system with his wife and he has 2 sons that support him. Patient is ready to start the program hoping to get stronger and lose weight.    Expected Outcomes Patient will continue to have no psychosocial barriers identified.    Continue Psychosocial Services  No Follow up required             Psychosocial Re-Evaluation:  Psychosocial Re-Evaluation     [place] Name 03/21/22 1306             Psychosocial Re-Evaluation   Current issues with Current Psychotropic Meds       Comments Patient is new to the program completing 6 sessions. He continues to have no psychosocial barriers identified. He seems to enjoy the sessions and demonstrates an interest in improving his health. His mood swings continue to be managed with Depakote.       Expected Outcomes Patient will continue to have no psychosocial barriers identified and his mood swings will continue to be managed.       Interventions Encouraged to attend  Cardiac Rehabilitation for the exercise;Stress management education;Relaxation education       Continue Psychosocial Services  No Follow up required                Psychosocial Discharge (Final Psychosocial Re-Evaluation):  Psychosocial Re-Evaluation - 03/21/22 1306       Psychosocial Re-Evaluation   Current issues with Current Psychotropic Meds    Comments Patient is new to the program completing 6 sessions. He continues to have no psychosocial barriers identified. He seems to enjoy the sessions and demonstrates an interest in improving his health. His mood swings continue to be managed with Depakote.    Expected Outcomes Patient will continue to have no psychosocial barriers identified and his mood swings will continue to be managed.    Interventions Encouraged to  attend Cardiac Rehabilitation for the exercise;Stress management education;Relaxation education    Continue Psychosocial Services  No Follow up required             Vocational Rehabilitation: Provide vocational rehab assistance to qualifying candidates.   Vocational Rehab Evaluation & Intervention:  Vocational Rehab - 03/04/22 1342       Initial Vocational Rehab Evaluation & Intervention   Assessment shows need for Vocational Rehabilitation No      Vocational Rehab Re-Evaulation   Comments Patient is retired and does not need voacational rehab.             Education: Education Goals: Education classes will be provided on a weekly basis, covering required topics. Participant will state understanding/return demonstration of topics presented.  Learning Barriers/Preferences:  Learning Barriers/Preferences - 03/04/22 1340       Learning Barriers/Preferences   Learning Barriers None    Learning Preferences Audio             Education Topics: Hypertension, Hypertension Reduction -Define heart disease and high blood pressure. Discus how high blood pressure affects the body and ways to reduce high  blood pressure.   Exercise and Your Heart -Discuss why it is important to exercise, the FITT principles of exercise, normal and abnormal responses to exercise, and how to exercise safely.   Angina -Discuss definition of angina, causes of angina, treatment of angina, and how to decrease risk of having angina. Flowsheet Row CARDIAC REHAB PHASE II EXERCISE from 03/23/2022 in [place]  Date 03/11/22  Educator DF  Instruction Review Code 1- Verbalizes Understanding       Cardiac Medications -Review what the following cardiac medications are used for, how they affect the body, and side effects that may occur when taking the medications.  Medications include Aspirin, Beta blockers, calcium channel blockers, ACE Inhibitors, angiotensin receptor blockers, diuretics, digoxin, and antihyperlipidemics. Flowsheet Row CARDIAC REHAB PHASE II EXERCISE from 03/23/2022 in [place]  Date 03/16/22  Educator DF  Instruction Review Code 2- Demonstrated Understanding       Congestive Heart Failure -Discuss the definition of CHF, how to live with CHF, the signs and symptoms of CHF, and how keep track of weight and sodium intake. Flowsheet Row CARDIAC REHAB PHASE II EXERCISE from 03/23/2022 in [place]  Date 03/23/22  Educator HB  Instruction Review Code 1- Verbalizes Understanding       Heart Disease and Intimacy -Discus the effect sexual activity has on the heart, how changes occur during intimacy as we age, and safety during sexual activity.   Smoking Cessation / COPD -Discuss different methods to quit smoking, the health benefits of quitting smoking, and the definition of COPD.   Nutrition I: Fats -Discuss the types of cholesterol, what cholesterol does to the heart, and how cholesterol levels can be controlled.   Nutrition II: Labels -Discuss the different components of food labels and how to read food  label   Heart Parts/Heart Disease and PAD -Discuss the anatomy of the heart, the pathway of blood circulation through the heart, and these are affected by heart disease.   Stress I: Signs and Symptoms -Discuss the causes of stress, how stress may lead to anxiety and depression, and ways to limit stress.   Stress II: Relaxation -Discuss different types of relaxation techniques to limit stress.   Warning Signs of Stroke / TIA -Discuss definition of a stroke, what the signs and symptoms are of a stroke, and  how to identify when someone is having stroke.   Knowledge Questionnaire Score:  Knowledge Questionnaire Score - 03/04/22 1342       Knowledge Questionnaire Score   Pre Score 20/24             Core Components/Risk Factors/Patient Goals at Admission:  Personal Goals and Risk Factors at Admission - 03/04/22 1342       Core Components/Risk Factors/Patient Goals on Admission    Weight Management Obesity;Yes    Intervention Weight Management: Develop a combined nutrition and exercise program designed to reach desired caloric intake, while maintaining appropriate intake of nutrient and fiber, sodium and fats, and appropriate energy expenditure required for the weight goal.;Weight Management: Provide education and appropriate resources to help participant work on and attain dietary goals.;Weight Management/Obesity: Establish reasonable short term and long term weight goals.;Obesity: Provide education and appropriate resources to help participant work on and attain dietary goals.    Admit Weight 213 lb 9.6 oz (96.9 kg)    Goal Weight: Short Term 203 lb (92.1 kg)    Expected Outcomes Weight Maintenance: Understanding of the daily nutrition guidelines, which includes 25-35% calories from fat, 7% or less cal from saturated fats, less than '200mg'$  cholesterol, less than 1.5gm of sodium, & 5 or more servings of fruits and vegetables daily    Hypertension Yes    Intervention Provide  education on lifestyle modifcations including regular physical activity/exercise, weight management, moderate sodium restriction and increased consumption of fresh fruit, vegetables, and low fat dairy, alcohol moderation, and smoking cessation.;Monitor prescription use compliance.    Expected Outcomes Short Term: Continued assessment and intervention until BP is < 140/76m HG in hypertensive participants. < 130/888mHG in hypertensive participants with diabetes, heart failure or chronic kidney disease.    Lipids Yes    Intervention Provide education and support for participant on nutrition & aerobic/resistive exercise along with prescribed medications to achieve LDL '70mg'$ , HDL >'40mg'$ .    Expected Outcomes Short Term: Participant states understanding of desired cholesterol values and is compliant with medications prescribed. Participant is following exercise prescription and nutrition guidelines.;Long Term: Cholesterol controlled with medications as prescribed, with individualized exercise RX and with personalized nutrition plan. Value goals: LDL < '70mg'$ , HDL > 40 mg.    Personal Goal Other Yes    Personal Goal Patient wants to lose weight and increase his strength and energy.    Intervention Patient will attend CR 3 days/week with exercise and education.    Expected Outcomes Patient will complete the program meeting both personal and program goals.             Core Components/Risk Factors/Patient Goals Review:   Goals and Risk Factor Review     Row Name 03/21/22 1308             Core Components/Risk Factors/Patient Goals Review   Personal Goals Review Weight Management/Obesity;Hypertension;Lipids;Other       Review Patient was referred to CR with Stent placement. He has multiple risk factors for CAD and is participating in the program for risk modification. He has completed 6 sessions with his current weight at 217.1 lbs up 4 lbs from his initial weight. His blood pressures have been labile  but he reports forgetting to take his medications some mornings. His personal goals for the program are to lose weigth and increase his strength and energy levels. We will continue to monitor his progress as he works towards meeting these goals.       Expected  Outcomes Patient will complete the program meeting both personal and program goals.                Core Components/Risk Factors/Patient Goals at Discharge (Final Review):   Goals and Risk Factor Review - 03/21/22 1308       Core Components/Risk Factors/Patient Goals Review   Personal Goals Review Weight Management/Obesity;Hypertension;Lipids;Other    Review Patient was referred to CR with Stent placement. He has multiple risk factors for CAD and is participating in the program for risk modification. He has completed 6 sessions with his current weight at 217.1 lbs up 4 lbs from his initial weight. His blood pressures have been labile but he reports forgetting to take his medications some mornings. His personal goals for the program are to lose weigth and increase his strength and energy levels. We will continue to monitor his progress as he works towards meeting these goals.    Expected Outcomes Patient will complete the program meeting both personal and program goals.             ITP Comments:   Comments: ITP REVIEW Pt is making expected progress toward Cardiac Rehab goals after completing 10 sessions. Recommend continued exercise, life style modification, education, and increased stamina and strength.

## 2022-04-01 ENCOUNTER — Encounter (HOSPITAL_COMMUNITY)
Admission: RE | Admit: 2022-04-01 | Discharge: 2022-04-01 | Disposition: A | Discharge status: Home / Self Care | Payer: PPO | Source: Ambulatory Visit | Attending: Cardiovascular Disease | Admitting: Cardiovascular Disease

## 2022-04-01 DIAGNOSIS — Z955 Presence of coronary angioplasty implant and graft: Secondary | ICD-10-CM

## 2022-04-01 NOTE — Progress Notes (Signed)
Daily Session Note  Patient Details  Name: CLEARENCE VITUG MRN: [id number] Date of Birth: 08-24-1945 Referring Provider:   Flowsheet Row CARDIAC REHAB PHASE II ORIENTATION from 03/04/2022 in [place]  Referring Provider Dr. Johnsie Cancel       Encounter Date: 04/01/2022  Check In:  Session Check In - 04/01/22 0815       Check-In   Supervising physician immediately available to respond to emergencies [facility] MD immediately available    Physician(s) Dr. Dellia Cloud    Location [facility]    Staff Present Hoy Register MHA, MS, ACSM-CEP;Madelyn Flavors, RN, BSN    Virtual Visit No    Medication changes reported     No    Fall or balance concerns reported    No    Tobacco Cessation No Change    Warm-up and Cool-down Performed as group-led instruction    Resistance Training Performed Yes    VAD Patient? No    PAD/SET Patient? No      Pain Assessment   Currently in Pain? No/denies    Pain Score 0-No pain    Multiple Pain Sites No             Capillary Blood Glucose: No results found for this or any previous visit (from the past 24 hour(s)).    Social History   Tobacco Use  Smoking Status Former   Packs/day: 2.00   Years: 20.00   Total pack years: 40.00   Types: Cigarettes   Quit date: 05/23/1988   Years since quitting: 33.8  Smokeless Tobacco Never  Tobacco Comments   smoked 2 packs per day for 20 years    Goals Met:  Independence with exercise equipment Exercise tolerated well No report of concerns or symptoms today Strength training completed today  Goals Unmet:  Not Applicable  Comments: checkout time is 0915   Dr. Carlyle Dolly is Medical Director for [place]

## 2022-04-04 ENCOUNTER — Encounter (HOSPITAL_COMMUNITY)
Admission: RE | Admit: 2022-04-04 | Discharge: 2022-04-04 | Disposition: A | Discharge status: Home / Self Care | Payer: PPO | Source: Ambulatory Visit | Attending: Cardiovascular Disease | Admitting: Cardiovascular Disease

## 2022-04-04 DIAGNOSIS — Z955 Presence of coronary angioplasty implant and graft: Secondary | ICD-10-CM

## 2022-04-04 NOTE — Progress Notes (Signed)
Daily Session Note  Patient Details  Name: Jeremy [name] MRN: [id number] Date of Birth: 03-Dec-1945 Referring Provider:   Flowsheet Row CARDIAC REHAB PHASE II ORIENTATION from 03/04/2022 in [place]  Referring Provider Dr. Johnsie Cancel       Encounter Date: 04/04/2022  Check In:  Session Check In - 04/04/22 0815       Check-In   Supervising physician immediately available to respond to emergencies [facility] MD immediately available    Physician(s) Dr. Harl Bowie    Location [facility]    Staff Present Leana Roe, BS, Exercise Physiologist;Daphyne Hassell Done, RN, BSN    Virtual Visit No    Medication changes reported     No    Fall or balance concerns reported    No    Tobacco Cessation No Change    Warm-up and Cool-down Performed as group-led instruction    Resistance Training Performed Yes    VAD Patient? No    PAD/SET Patient? No      Pain Assessment   Currently in Pain? No/denies    Pain Score 0-No pain    Multiple Pain Sites No             Capillary Blood Glucose: No results found for this or any previous visit (from the past 24 hour(s)).    Social History   Tobacco Use  Smoking Status Former   Packs/day: 2.00   Years: 20.00   Total pack years: 40.00   Types: Cigarettes   Quit date: 05/23/1988   Years since quitting: 33.8  Smokeless Tobacco Never  Tobacco Comments   smoked 2 packs per day for 20 years    Goals Met:  Independence with exercise equipment Exercise tolerated well No report of concerns or symptoms today Strength training completed today  Goals Unmet:  Not Applicable  Comments: check out 0915   Dr. Carlyle Dolly is Medical Director for [place]

## 2022-04-06 ENCOUNTER — Encounter (HOSPITAL_COMMUNITY)
Admission: RE | Admit: 2022-04-06 | Discharge: 2022-04-06 | Disposition: A | Discharge status: Home / Self Care | Payer: PPO | Source: Ambulatory Visit | Attending: Cardiovascular Disease | Admitting: Cardiovascular Disease

## 2022-04-06 DIAGNOSIS — Z955 Presence of coronary angioplasty implant and graft: Secondary | ICD-10-CM | POA: Diagnosis not present

## 2022-04-06 NOTE — Progress Notes (Signed)
Daily Session Note  Patient Details  Name: Jeremy [name] MRN: [id number] Date of Birth: 04-Feb-1946 Referring Provider:   Flowsheet Row CARDIAC REHAB PHASE II ORIENTATION from 03/04/2022 in [place]  Referring Provider Dr. Johnsie Cancel       Encounter Date: 04/06/2022  Check In:  Session Check In - 04/06/22 0815       Check-In   Supervising physician immediately available to respond to emergencies [facility] MD immediately available    Physician(s) Dr. Harl Bowie    Location [facility]    Staff Present Leana Roe, BS, Exercise Physiologist;Dalton Sherrie George, MS, ACSM-CEP    Virtual Visit No    Medication changes reported     No    Fall or balance concerns reported    No    Tobacco Cessation No Change    Warm-up and Cool-down Performed as group-led instruction    Resistance Training Performed Yes    VAD Patient? No    PAD/SET Patient? No      Pain Assessment   Currently in Pain? No/denies    Pain Score 0-No pain    Multiple Pain Sites No             Capillary Blood Glucose: No results found for this or any previous visit (from the past 24 hour(s)).    Social History   Tobacco Use  Smoking Status Former   Packs/day: 2.00   Years: 20.00   Total pack years: 40.00   Types: Cigarettes   Quit date: 05/23/1988   Years since quitting: 33.8  Smokeless Tobacco Never  Tobacco Comments   smoked 2 packs per day for 20 years    Goals Met:  Independence with exercise equipment Exercise tolerated well No report of concerns or symptoms today Strength training completed today  Goals Unmet:  Not Applicable  Comments: check out 0915   Dr. Carlyle Dolly is Medical Director for [place]

## 2022-04-08 ENCOUNTER — Encounter (HOSPITAL_COMMUNITY)
Admission: RE | Admit: 2022-04-08 | Discharge: 2022-04-08 | Disposition: A | Discharge status: Home / Self Care | Payer: PPO | Source: Ambulatory Visit | Attending: Cardiovascular Disease | Admitting: Cardiovascular Disease

## 2022-04-08 DIAGNOSIS — Z955 Presence of coronary angioplasty implant and graft: Secondary | ICD-10-CM

## 2022-04-08 NOTE — Progress Notes (Signed)
Daily Session Note  Patient Details  Name: Jeremy [name] MRN: [id number] Date of Birth: [birth year] Referring Provider:   Flowsheet Row CARDIAC REHAB PHASE II ORIENTATION from 03/04/2022 in [place]  Referring Provider Dr. Johnsie Cancel       Encounter Date: 04/08/2022  Check In:  Session Check In - 04/08/22 0810       Check-In   Supervising physician immediately available to respond to emergencies [facility] MD immediately available    Physician(s) Dr. Harl Bowie    Staff Present Aundra Dubin, RN, Joanette Gula, RN, BSN;Heather Mel Almond, BS, Exercise Physiologist    Virtual Visit No    Medication changes reported     No    Fall or balance concerns reported    No    Tobacco Cessation No Change    Warm-up and Cool-down Performed as group-led instruction    Resistance Training Performed Yes    VAD Patient? No    PAD/SET Patient? No      Pain Assessment   Currently in Pain? No/denies    Pain Score 0-No pain    Multiple Pain Sites No             Capillary Blood Glucose: No results found for this or any previous visit (from the past 24 hour(s)).    Social History   Tobacco Use  Smoking Status Former   Packs/day: 2.00   Years: 20.00   Total pack years: 40.00   Types: Cigarettes   Quit date: 05/23/1988   Years since quitting: 33.8  Smokeless Tobacco Never  Tobacco Comments   smoked 2 packs per day for 20 years    Goals Met:  Independence with exercise equipment Exercise tolerated well No report of concerns or symptoms today Strength training completed today  Goals Unmet:  Not Applicable  Comments: Check out 915.   Dr. Carlyle Dolly is Medical Director for [facility]

## 2022-04-11 ENCOUNTER — Encounter (HOSPITAL_COMMUNITY)
Admission: RE | Admit: 2022-04-11 | Discharge: 2022-04-11 | Disposition: A | Discharge status: Home / Self Care | Payer: PPO | Source: Ambulatory Visit | Attending: Cardiovascular Disease | Admitting: Cardiovascular Disease

## 2022-04-11 VITALS — Wt 213.0 lb

## 2022-04-11 DIAGNOSIS — Z955 Presence of coronary angioplasty implant and graft: Secondary | ICD-10-CM | POA: Diagnosis not present

## 2022-04-11 NOTE — Progress Notes (Signed)
Daily Session Note  Patient Details  Name: Jeremy [name] MRN: [id number] Date of Birth: 01/10/1946 Referring Provider:   Flowsheet Row CARDIAC REHAB PHASE II ORIENTATION from 03/04/2022 in [place]  Referring Provider Dr. Johnsie Cancel       Encounter Date: 04/11/2022  Check In:  Session Check In - 04/11/22 0815       Check-In   Supervising physician immediately available to respond to emergencies [facility] MD immediately available    Physician(s) Dr. Domenic Polite    Location [facility]    Staff Present Leana Roe, BS, Exercise Physiologist;Daphyne Hassell Done, RN, BSN    Virtual Visit No    Medication changes reported     No    Fall or balance concerns reported    No    Tobacco Cessation No Change    Warm-up and Cool-down Performed as group-led instruction    Resistance Training Performed Yes    VAD Patient? No    PAD/SET Patient? No      Pain Assessment   Currently in Pain? No/denies    Pain Score 0-No pain    Multiple Pain Sites No             Capillary Blood Glucose: No results found for this or any previous visit (from the past 24 hour(s)).    Social History   Tobacco Use  Smoking Status Former   Packs/day: 2.00   Years: 20.00   Total pack years: 40.00   Types: Cigarettes   Quit date: 05/23/1988   Years since quitting: 33.9  Smokeless Tobacco Never  Tobacco Comments   smoked 2 packs per day for 20 years    Goals Met:  Independence with exercise equipment Exercise tolerated well No report of concerns or symptoms today Strength training completed today  Goals Unmet:  Not Applicable  Comments: check out 0915   Dr. Carlyle Dolly is Medical Director for [place]

## 2022-04-13 ENCOUNTER — Encounter (HOSPITAL_COMMUNITY)
Admission: RE | Admit: 2022-04-13 | Discharge: 2022-04-13 | Disposition: A | Discharge status: Home / Self Care | Payer: PPO | Source: Ambulatory Visit | Attending: Cardiovascular Disease | Admitting: Cardiovascular Disease

## 2022-04-13 DIAGNOSIS — Z955 Presence of coronary angioplasty implant and graft: Secondary | ICD-10-CM

## 2022-04-13 NOTE — Progress Notes (Signed)
Daily Session Note  Patient Details  Name: Jeremy [name] MRN: [id number] Date of Birth: 02-22-1946 Referring Provider:   Flowsheet Row CARDIAC REHAB PHASE II ORIENTATION from 03/04/2022 in [place]  Referring Provider Dr. Johnsie Cancel       Encounter Date: 04/13/2022  Check In:  Session Check In - 04/13/22 0814       Check-In   Supervising physician immediately available to respond to emergencies [facility] MD immediately available    Physician(s) Dr. Domenic Polite    Location [facility]    Staff Present Leana Roe, BS, Exercise Physiologist;Dalton Sherrie George, MS, ACSM-CEP    Virtual Visit No    Medication changes reported     No    Fall or balance concerns reported    No    Tobacco Cessation No Change    Warm-up and Cool-down Performed as group-led instruction    Resistance Training Performed Yes    VAD Patient? No    PAD/SET Patient? No      Pain Assessment   Currently in Pain? No/denies    Pain Score 0-No pain    Multiple Pain Sites No             Capillary Blood Glucose: No results found for this or any previous visit (from the past 24 hour(s)).    Social History   Tobacco Use  Smoking Status Former   Packs/day: 2.00   Years: 20.00   Total pack years: 40.00   Types: Cigarettes   Quit date: 05/23/1988   Years since quitting: 33.9  Smokeless Tobacco Never  Tobacco Comments   smoked 2 packs per day for 20 years    Goals Met:  Independence with exercise equipment Exercise tolerated well No report of concerns or symptoms today Strength training completed today  Goals Unmet:  Not Applicable  Comments: check out 0915   Dr. Carlyle Dolly is Medical Director for [place]

## 2022-04-15 ENCOUNTER — Encounter (HOSPITAL_COMMUNITY): Payer: PPO

## 2022-04-18 ENCOUNTER — Encounter (HOSPITAL_COMMUNITY)
Admission: RE | Admit: 2022-04-18 | Discharge: 2022-04-18 | Disposition: A | Discharge status: Home / Self Care | Payer: PPO | Source: Ambulatory Visit | Attending: Cardiovascular Disease | Admitting: Cardiovascular Disease

## 2022-04-18 DIAGNOSIS — Z955 Presence of coronary angioplasty implant and graft: Secondary | ICD-10-CM

## 2022-04-18 NOTE — Progress Notes (Signed)
Daily Session Note  Patient Details  Name: Jeremy [name] MRN: [id number] Date of Birth: [birth year] Referring Provider:   Flowsheet Row CARDIAC REHAB PHASE II ORIENTATION from 03/04/2022 in [place]  Referring Provider Dr. Johnsie Cancel       Encounter Date: 04/18/2022  Check In:  Session Check In - 04/18/22 0815       Check-In   Supervising physician immediately available to respond to emergencies [facility] MD immediately available    Physician(s) Dr. Dellia Cloud    Location [facility]    Staff Present Leana Roe, BS, Exercise Physiologist;Dalton Sherrie George, MS, ACSM-CEP;Madelyn Flavors, RN, BSN    Virtual Visit No    Medication changes reported     No    Fall or balance concerns reported    No    Tobacco Cessation No Change    Warm-up and Cool-down Performed as group-led instruction    Resistance Training Performed Yes    VAD Patient? No    PAD/SET Patient? No      Pain Assessment   Currently in Pain? No/denies    Pain Score 0-No pain    Multiple Pain Sites No             Capillary Blood Glucose: No results found for this or any previous visit (from the past 24 hour(s)).    Social History   Tobacco Use  Smoking Status Former   Packs/day: 2.00   Years: 20.00   Total pack years: 40.00   Types: Cigarettes   Quit date: 05/23/1988   Years since quitting: 33.9  Smokeless Tobacco Never  Tobacco Comments   smoked 2 packs per day for 20 years    Goals Met:  Independence with exercise equipment Exercise tolerated well No report of concerns or symptoms today Strength training completed today  Goals Unmet:  Not Applicable  Comments: check out 0915   Dr. Carlyle Dolly is Medical Director for [place]

## 2022-04-20 ENCOUNTER — Encounter (HOSPITAL_COMMUNITY)
Admission: RE | Admit: 2022-04-20 | Discharge: 2022-04-20 | Disposition: A | Discharge status: Home / Self Care | Payer: PPO | Source: Ambulatory Visit | Attending: Cardiovascular Disease | Admitting: Cardiovascular Disease

## 2022-04-20 DIAGNOSIS — Z955 Presence of coronary angioplasty implant and graft: Secondary | ICD-10-CM

## 2022-04-20 NOTE — Progress Notes (Signed)
Daily Session Note  Patient Details  Name: Jeremy [name] MRN: [id number] Date of Birth: [birth year] Referring Provider:   Flowsheet Row CARDIAC REHAB PHASE II ORIENTATION from 03/04/2022 in [place]  Referring Provider Dr. Johnsie Cancel       Encounter Date: 04/20/2022  Check In:  Session Check In - 04/20/22 0811       Check-In   Supervising physician immediately available to respond to emergencies [facility] MD immediately available    Physician(s) Dr. Dellia Cloud    Location [facility]    Staff Present Leana Roe, BS, Exercise Physiologist;Dalton Sherrie George, MS, ACSM-CEP;Melven Sartorius BSN, RN    Virtual Visit No    Medication changes reported     No    Fall or balance concerns reported    No    Tobacco Cessation No Change    Warm-up and Cool-down Performed on first and last piece of equipment    Resistance Training Performed Yes    VAD Patient? No    PAD/SET Patient? No      Pain Assessment   Currently in Pain? No/denies    Pain Score 0-No pain    Multiple Pain Sites No             Capillary Blood Glucose: No results found for this or any previous visit (from the past 24 hour(s)).    Social History   Tobacco Use  Smoking Status Former   Packs/day: 2.00   Years: 20.00   Total pack years: 40.00   Types: Cigarettes   Quit date: 05/23/1988   Years since quitting: 33.9  Smokeless Tobacco Never  Tobacco Comments   smoked 2 packs per day for 20 years    Goals Met:  Independence with exercise equipment Exercise tolerated well No report of concerns or symptoms today Strength training completed today  Goals Unmet:  Not Applicable  Comments: check out at 9:15    Dr. Carlyle Dolly is Medical Director for [place]

## 2022-04-20 NOTE — Progress Notes (Signed)
I have reviewed a Home Exercise Prescription with Jeremy [name] . [name] is currently exercising at home by walking when he is able to due to hip pain.  The patient was advised to walk 2 days a week for 30-45 minutes.  Chrissie Noa and I discussed how to progress their exercise prescription.  The patient stated that their goals were build up his strength.  The patient stated that they understand the exercise prescription.  We reviewed exercise guidelines, target heart rate during exercise, RPE Scale, weather conditions, NTG use, endpoints for exercise, warmup and cool down.  Patient is encouraged to come to me with any questions. I will continue to follow up with the patient to assist them with progression and safety.

## 2022-04-22 ENCOUNTER — Encounter (HOSPITAL_COMMUNITY): Payer: PPO

## 2022-04-25 ENCOUNTER — Encounter (HOSPITAL_COMMUNITY)
Admission: RE | Admit: 2022-04-25 | Discharge: 2022-04-25 | Disposition: A | Discharge status: Home / Self Care | Payer: PPO | Source: Ambulatory Visit | Attending: Cardiovascular Disease | Admitting: Cardiovascular Disease

## 2022-04-25 VITALS — Wt 208.1 lb

## 2022-04-25 DIAGNOSIS — Z955 Presence of coronary angioplasty implant and graft: Secondary | ICD-10-CM | POA: Diagnosis not present

## 2022-04-25 NOTE — Progress Notes (Signed)
Daily Session Note  Patient Details  Name: Jeremy [name] MRN: [id number] Date of Birth: [birth year] Referring Provider:   Flowsheet Row CARDIAC REHAB PHASE II ORIENTATION from 03/04/2022 in [place]  Referring Provider Dr. Johnsie Cancel       Encounter Date: 04/25/2022  Check In:  Session Check In - 04/25/22 0815       Check-In   Supervising physician immediately available to respond to emergencies [facility] MD immediately available    Physician(s) Dr. Harl Bowie    Location [facility]    Staff Present Leana Roe, BS, Exercise Physiologist;Dalton Sherrie George, MS, ACSM-CEP;Madelyn Flavors, RN, BSN    Virtual Visit No    Medication changes reported     No    Fall or balance concerns reported    No    Tobacco Cessation No Change    Warm-up and Cool-down Performed as group-led instruction    Resistance Training Performed Yes    VAD Patient? No    PAD/SET Patient? No      Pain Assessment   Currently in Pain? No/denies    Pain Score 0-No pain    Multiple Pain Sites No             Capillary Blood Glucose: No results found for this or any previous visit (from the past 24 hour(s)).    Social History   Tobacco Use  Smoking Status Former   Packs/day: 2.00   Years: 20.00   Total pack years: 40.00   Types: Cigarettes   Quit date: 05/23/1988   Years since quitting: 33.9  Smokeless Tobacco Never  Tobacco Comments   smoked 2 packs per day for 20 years    Goals Met:  Independence with exercise equipment Exercise tolerated well No report of concerns or symptoms today Strength training completed today  Goals Unmet:  Not Applicable  Comments: check out 0915   Dr. Carlyle Dolly is Medical Director for [place]

## 2022-04-25 NOTE — Progress Notes (Signed)
Daily Session Note  Patient Details  Name: Jeremy [name] MRN: [id number] Date of Birth: Aug 20, 1945 Referring Provider:   Flowsheet Row CARDIAC REHAB PHASE II ORIENTATION from 03/04/2022 in [place]  Referring Provider Dr. Johnsie Cancel       Encounter Date: 04/25/2022  Check In:  Session Check In - 04/25/22 0815       Check-In   Supervising physician immediately available to respond to emergencies [facility] MD immediately available    Physician(s) Dr. Harl Bowie    Location [facility]    Staff Present Leana Roe, BS, Exercise Physiologist;Dalton Sherrie George, MS, ACSM-CEP;Madelyn Flavors, RN, BSN    Virtual Visit No    Medication changes reported     No    Fall or balance concerns reported    No    Tobacco Cessation No Change    Warm-up and Cool-down Performed as group-led instruction    Resistance Training Performed Yes    VAD Patient? No    PAD/SET Patient? No      Pain Assessment   Currently in Pain? No/denies    Pain Score 0-No pain    Multiple Pain Sites No             Capillary Blood Glucose: No results found for this or any previous visit (from the past 24 hour(s)).    Social History   Tobacco Use  Smoking Status Former   Packs/day: 2.00   Years: 20.00   Total pack years: 40.00   Types: Cigarettes   Quit date: 05/23/1988   Years since quitting: 33.9  Smokeless Tobacco Never  Tobacco Comments   smoked 2 packs per day for 20 years    Goals Met:  Independence with exercise equipment Exercise tolerated well No report of concerns or symptoms today Strength training completed today  Goals Unmet:  Not Applicable  Comments: check out 0915   Dr. Carlyle Dolly is Medical Director for [place]

## 2022-04-27 ENCOUNTER — Encounter (HOSPITAL_COMMUNITY)
Admission: RE | Admit: 2022-04-27 | Discharge: 2022-04-27 | Disposition: A | Discharge status: Home / Self Care | Payer: PPO | Source: Ambulatory Visit | Attending: Cardiovascular Disease | Admitting: Cardiovascular Disease

## 2022-04-27 DIAGNOSIS — Z955 Presence of coronary angioplasty implant and graft: Secondary | ICD-10-CM | POA: Diagnosis not present

## 2022-04-27 NOTE — Progress Notes (Signed)
Cardiac Individual Treatment Plan  Patient Details  Name: Jeremy [name] MRN: [id number] Date of Birth: 1945/05/28 Referring Provider:   Flowsheet Row CARDIAC REHAB PHASE II ORIENTATION from 03/04/2022 in [facility]  Referring Provider Dr. Johnsie Cancel       Initial Encounter Date:  Flowsheet Row CARDIAC REHAB PHASE II ORIENTATION from 03/04/2022 in [place]  Date 03/04/22       Visit Diagnosis: S/P drug eluting coronary stent placement  Patient's Home Medications on Admission:  Current Outpatient Medications:    acetaminophen (TYLENOL) 500 MG tablet, Take 500 mg by mouth every 6 (six) hours as needed for mild pain., Disp: , Rfl:    aspirin EC 81 MG EC tablet, Take 1 tablet (81 mg total) by mouth daily., Disp: 90 tablet, Rfl: 3   carvedilol (COREG) 12.5 MG tablet, Take 1 tablet (12.5 mg total) by mouth 2 (two) times daily with a meal., Disp: 180 tablet, Rfl: 3   clopidogrel (PLAVIX) 75 MG tablet, Take 1 tablet by mouth once daily (Patient taking differently: Take 75 mg by mouth every evening.), Disp: 90 tablet, Rfl: 3   divalproex (DEPAKOTE) 500 MG DR tablet, Take 1 tablet (500 mg total) by mouth every 12 (twelve) hours., Disp: 180 tablet, Rfl: 3   gabapentin (NEURONTIN) 300 MG capsule, Take 1 capsule (300 mg total) by mouth 3 (three) times daily., Disp: 90 capsule, Rfl: 6   isosorbide mononitrate (IMDUR) 30 MG 24 hr tablet, Take 1 tablet by mouth once daily (Patient taking differently: Take 30 mg by mouth daily after breakfast.), Disp: 90 tablet, Rfl: 1   losartan (COZAAR) 50 MG tablet, Take 1 tablet (50 mg total) by mouth daily., Disp: 90 tablet, Rfl: 3   nitroGLYCERIN (NITROSTAT) 0.4 MG SL tablet, Place 1 tablet (0.4 mg total) under the tongue every 5 (five) minutes as needed for chest pain., Disp: 25 tablet, Rfl: 3   nystatin (MYCOSTATIN/NYSTOP) powder, Apply 1 Application topically 2 (two) times daily as needed (skin irritation/rash.).,  Disp: , Rfl:    nystatin cream (MYCOSTATIN), Apply 1 Application topically 2 (two) times daily as needed (severe irritation/rash)., Disp: , Rfl:    oxyCODONE (ROXICODONE) 15 MG immediate release tablet, Take 15 mg by mouth every 6 (six) hours as needed for pain., Disp: , Rfl:    pantoprazole (PROTONIX) 40 MG tablet, Take 1 tablet (40 mg total) by mouth daily., Disp: 90 tablet, Rfl: 3   RESTASIS MULTIDOSE 0.05 % ophthalmic emulsion, Place 1 drop into both eyes 2 (two) times daily as needed (dry/irritated eyes.)., Disp: , Rfl:    rosuvastatin (CRESTOR) 40 MG tablet, Take 40 mg by mouth at bedtime., Disp: , Rfl:    XIIDRA 5 % SOLN, Place 1 drop into both eyes daily as needed (dry eye)., Disp: , Rfl:   Past Medical History: Past Medical History:  Diagnosis Date   Arthritis    Basal cell carcinoma 02/11/2021   nod- left dorsal hand (EXC)   CAD (coronary artery disease)    a. s/p prior stenting of LAD and RCA b. 03/2019: NSTEMI and required PTCA/DESx1 to the LAD and successful scoring balloon angioplasty in the previously stented segment of LCx   Carotid stenosis, asymptomatic, right    Carpal tunnel syndrome    bilateral   Coronary artery disease    GERD (gastroesophageal reflux disease)    History of kidney stones    Hx of colonic polyp    Hypercholesterolemia    Hypertension  Myocardial infarction (HCC)    hx of   Obesity    SCC (squamous cell carcinoma) 02/11/2021   in situ- right temple ([id number])   Squamous cell carcinoma of skin 08/09/2016   well differentiated on right outer eye - tx p bx   Stroke ([facility])     Tobacco Use: Social History   Tobacco Use  Smoking Status Former   Packs/day: 2.00   Years: 20.00   Total pack years: 40.00   Types: Cigarettes   Quit date: 05/23/1988   Years since quitting: 33.9  Smokeless Tobacco Never  Tobacco Comments   smoked 2 packs per day for 20 years    Labs: Review Flowsheet  More data exists      Latest Ref Rng & Units 03/26/2019  09/20/2020 11/13/2021 11/14/2021 11/15/2021  Labs for ITP Cardiac and Pulmonary Rehab  Cholestrol 0 - 200 mg/dL 87  112  - 124  -  LDL (calc) 0 - 99 mg/dL 36  44  - 59  -  HDL-C >40 mg/dL 32  33  - 32  -  Trlycerides <150 mg/dL 95  177  - 164  -  Hemoglobin A1c 4.8 - 5.6 % - - 5.4  - -  PH, Arterial 7.35 - 7.45 - - - - 7.354   PCO2 arterial 32 - 48 mmHg - - - - 43.4   Bicarbonate 20.0 - 28.0 mmol/L - - - - 24.1   TCO2 22 - 32 mmol/L - - - - 25   Acid-base deficit 0.0 - 2.0 mmol/L - - - - 1.0   O2 Saturation % - - - - 97     Capillary Blood Glucose: Lab Results  Component Value Date   GLUCAP 93 11/16/2021   GLUCAP 78 11/15/2021   GLUCAP 75 11/15/2021   GLUCAP 87 06/26/2019   GLUCAP 130 (H) 01/30/2019     Exercise Target Goals: Exercise Program Goal: Individual exercise prescription set using results from initial 6 min walk test and THRR while considering  patient's activity barriers and safety.   Exercise Prescription Goal: Starting with aerobic activity 30 plus minutes a day, 3 days per week for initial exercise prescription. Provide home exercise prescription and guidelines that participant acknowledges understanding prior to discharge.  Activity Barriers & Risk Stratification:  Activity Barriers & Cardiac Risk Stratification - 03/04/22 1257       Activity Barriers & Cardiac Risk Stratification   Activity Barriers Arthritis;Back Problems;Shortness of Breath    Cardiac Risk Stratification High             6 Minute Walk:  6 Minute Walk     Row Name 03/04/22 1355         6 Minute Walk   Phase Initial     Distance 1200 feet     Walk Time 6 minutes     # of Rest Breaks 0     MPH 2.27     METS 2.05     RPE 13     VO2 Peak 7.19     Symptoms Yes (comment)     Comments Bilateral hip pain while walking (6/10)     Resting HR 61 bpm     Resting BP 100/50     Resting Oxygen Saturation  94 %     Exercise Oxygen Saturation  during 6 min walk 96 %     Max Ex. HR 88  bpm     Max Ex. BP 140/58  2 Minute Post BP 120/52              Oxygen Initial Assessment:   Oxygen Re-Evaluation:   Oxygen Discharge (Final Oxygen Re-Evaluation):   Initial Exercise Prescription:  Initial Exercise Prescription - 03/04/22 1300       Date of Initial Exercise RX and Referring Provider   Date 03/04/22    Referring Provider Dr. Johnsie Cancel      Treadmill   MPH 1.3    Grade 0    Minutes 17      NuStep   Level 1    SPM 60    Minutes 22      Prescription Details   Frequency (times per week) 3    Duration Progress to 30 minutes of continuous aerobic without signs/symptoms of physical distress      Intensity   THRR 40-80% of Max Heartrate 58-116    Ratings of Perceived Exertion 11-13      Resistance Training   Training Prescription Yes    Weight 3    Reps 10-15             Perform Capillary Blood Glucose checks as needed.  Exercise Prescription Changes:   Exercise Prescription Changes     Row Name 03/14/22 1000 03/28/22 1000 04/11/22 1000 04/20/22 0800 04/25/22 1200     Response to Exercise   Blood Pressure (Admit) 150/60 120/60 124/58 -- 140/60   Blood Pressure (Exercise) 170/68 134/54 130/60 -- 150/60   Blood Pressure (Exit) 148/72 110/60 122/60 -- 140/60   Heart Rate (Admit) 62 bpm 53 bpm 57 bpm -- 5 bpm   Heart Rate (Exercise) 78 bpm 90 bpm 83 bpm -- 81 bpm   Heart Rate (Exit) 71 bpm 59 bpm 64 bpm -- 66 bpm   Rating of Perceived Exertion (Exercise) '11 12 11 '$ -- 10   Duration Continue with 30 min of aerobic exercise without signs/symptoms of physical distress. Continue with 30 min of aerobic exercise without signs/symptoms of physical distress. Continue with 30 min of aerobic exercise without signs/symptoms of physical distress. -- Continue with 30 min of aerobic exercise without signs/symptoms of physical distress.   Intensity THRR unchanged THRR unchanged THRR unchanged -- THRR unchanged     Progression   Progression Continue to  progress workloads to maintain intensity without signs/symptoms of physical distress. Continue to progress workloads to maintain intensity without signs/symptoms of physical distress. Continue to progress workloads to maintain intensity without signs/symptoms of physical distress. -- Continue to progress workloads to maintain intensity without signs/symptoms of physical distress.     Resistance Training   Training Prescription Yes Yes Yes -- Yes   Weight '4 5 3 '$ -- 5   Reps 10-15 10-15 10-15 -- 10-15   Time 10 Minutes 10 Minutes 10 Minutes -- 10 Minutes     Treadmill   MPH 2.2 -- -- -- --   Grade 0 -- -- -- --   Minutes 17 -- -- -- --   METs 2.69 -- -- -- --     NuStep   Level '1 2 2 '$ -- 3   SPM 70 79 87 -- 79   Minutes 22 39 39 -- 39   METs 1.96 2.09 2.36 -- 2.09     Home Exercise Plan   Plans to continue exercise at -- -- -- Home (comment) --   Frequency -- -- -- Add 2 additional days to program exercise sessions. --   Initial Home Exercises Provided -- -- --  04/20/22 --            Exercise Comments:   Exercise Comments     Row Name 04/20/22 0843           Exercise Comments home exercise reviewed                Exercise Goals and Review:   Exercise Goals     Row Name 03/04/22 1357 03/28/22 1040 04/25/22 1252         Exercise Goals   Increase Physical Activity Yes Yes Yes     Intervention Provide advice, education, support and counseling about physical activity/exercise needs.;Develop an individualized exercise prescription for aerobic and resistive training based on initial evaluation findings, risk stratification, comorbidities and participant's personal goals. Provide advice, education, support and counseling about physical activity/exercise needs.;Develop an individualized exercise prescription for aerobic and resistive training based on initial evaluation findings, risk stratification, comorbidities and participant's personal goals. Provide advice,  education, support and counseling about physical activity/exercise needs.;Develop an individualized exercise prescription for aerobic and resistive training based on initial evaluation findings, risk stratification, comorbidities and participant's personal goals.     Expected Outcomes Short Term: Attend rehab on a regular basis to increase amount of physical activity.;Long Term: Add in home exercise to make exercise part of routine and to increase amount of physical activity.;Long Term: Exercising regularly at least 3-5 days a week. Short Term: Attend rehab on a regular basis to increase amount of physical activity.;Long Term: Add in home exercise to make exercise part of routine and to increase amount of physical activity.;Long Term: Exercising regularly at least 3-5 days a week. Short Term: Attend rehab on a regular basis to increase amount of physical activity.;Long Term: Add in home exercise to make exercise part of routine and to increase amount of physical activity.;Long Term: Exercising regularly at least 3-5 days a week.     Increase Strength and Stamina Yes Yes Yes     Intervention Provide advice, education, support and counseling about physical activity/exercise needs.;Develop an individualized exercise prescription for aerobic and resistive training based on initial evaluation findings, risk stratification, comorbidities and participant's personal goals. Provide advice, education, support and counseling about physical activity/exercise needs.;Develop an individualized exercise prescription for aerobic and resistive training based on initial evaluation findings, risk stratification, comorbidities and participant's personal goals. Provide advice, education, support and counseling about physical activity/exercise needs.;Develop an individualized exercise prescription for aerobic and resistive training based on initial evaluation findings, risk stratification, comorbidities and participant's personal  goals.     Expected Outcomes Short Term: Increase workloads from initial exercise prescription for resistance, speed, and METs.;Short Term: Perform resistance training exercises routinely during rehab and add in resistance training at home;Long Term: Improve cardiorespiratory fitness, muscular endurance and strength as measured by increased METs and functional capacity (6MWT) Short Term: Increase workloads from initial exercise prescription for resistance, speed, and METs.;Short Term: Perform resistance training exercises routinely during rehab and add in resistance training at home;Long Term: Improve cardiorespiratory fitness, muscular endurance and strength as measured by increased METs and functional capacity (6MWT) Short Term: Increase workloads from initial exercise prescription for resistance, speed, and METs.;Short Term: Perform resistance training exercises routinely during rehab and add in resistance training at home;Long Term: Improve cardiorespiratory fitness, muscular endurance and strength as measured by increased METs and functional capacity (6MWT)     Able to understand and use rate of perceived exertion (RPE) scale Yes Yes Yes     Intervention Provide education and  explanation on how to use RPE scale Provide education and explanation on how to use RPE scale --     Expected Outcomes Long Term:  Able to use RPE to guide intensity level when exercising independently;Short Term: Able to use RPE daily in rehab to express subjective intensity level Long Term:  Able to use RPE to guide intensity level when exercising independently;Short Term: Able to use RPE daily in rehab to express subjective intensity level Long Term:  Able to use RPE to guide intensity level when exercising independently;Short Term: Able to use RPE daily in rehab to express subjective intensity level     Knowledge and understanding of Target Heart Rate Range (THRR) Yes Yes Yes     Intervention Provide education and explanation of  THRR including how the numbers were predicted and where they are located for reference Provide education and explanation of THRR including how the numbers were predicted and where they are located for reference Provide education and explanation of THRR including how the numbers were predicted and where they are located for reference     Expected Outcomes Short Term: Able to state/look up THRR;Short Term: Able to use daily as guideline for intensity in rehab;Long Term: Able to use THRR to govern intensity when exercising independently Short Term: Able to state/look up THRR;Short Term: Able to use daily as guideline for intensity in rehab;Long Term: Able to use THRR to govern intensity when exercising independently Short Term: Able to state/look up THRR;Short Term: Able to use daily as guideline for intensity in rehab;Long Term: Able to use THRR to govern intensity when exercising independently     Able to check pulse independently Yes Yes Yes     Intervention Provide education and demonstration on how to check pulse in carotid and radial arteries.;Review the importance of being able to check your own pulse for safety during independent exercise Provide education and demonstration on how to check pulse in carotid and radial arteries.;Review the importance of being able to check your own pulse for safety during independent exercise Provide education and demonstration on how to check pulse in carotid and radial arteries.;Review the importance of being able to check your own pulse for safety during independent exercise     Expected Outcomes Short Term: Able to explain why pulse checking is important during independent exercise;Long Term: Able to check pulse independently and accurately Short Term: Able to explain why pulse checking is important during independent exercise;Long Term: Able to check pulse independently and accurately Short Term: Able to explain why pulse checking is important during independent  exercise;Long Term: Able to check pulse independently and accurately     Understanding of Exercise Prescription Yes Yes Yes     Intervention Provide education, explanation, and written materials on patient's individual exercise prescription Provide education, explanation, and written materials on patient's individual exercise prescription Provide education, explanation, and written materials on patient's individual exercise prescription     Expected Outcomes Short Term: Able to explain program exercise prescription;Long Term: Able to explain home exercise prescription to exercise independently Short Term: Able to explain program exercise prescription;Long Term: Able to explain home exercise prescription to exercise independently Short Term: Able to explain program exercise prescription;Long Term: Able to explain home exercise prescription to exercise independently              Exercise Goals Re-Evaluation :  Exercise Goals Re-Evaluation     Row Name 03/28/22 1040 04/25/22 1252  Exercise Goal Re-Evaluation   Exercise Goals Review Increase Physical Activity;Increase Strength and Stamina;Able to understand and use rate of perceived exertion (RPE) scale;Knowledge and understanding of Target Heart Rate Range (THRR);Able to check pulse independently;Understanding of Exercise Prescription Increase Strength and Stamina;Increase Physical Activity;Able to understand and use rate of perceived exertion (RPE) scale;Knowledge and understanding of Target Heart Rate Range (THRR);Able to check pulse independently;Understanding of Exercise Prescription      Comments Pt has completed 10 sessions of cardiac rehab. He is increasing his workload on the stepper. He was recently moves from the treadmill to do the stepper for both sessions due to having increased hip pain when walking for long period of time. He is currently exercising at 2.09 METs on the stepper. Will continue to monitor and progress as able.  Pt has completed 20 session of cardiac rehab. He continues to use the stepper for both sessions due to hip pain when walking on the treadmill. He is increasing his level on the stepper. He is walking at home around his yard and driveway. He is curretnly exercising at 2.09 METs on the stepper. WIll continue to monitor and progress as able.      Expected Outcomes Through exercise at rehab and home, the patient will meet their stated goals. Through exercise at rehab and home, the patient will meet their stated goals.                Discharge Exercise Prescription (Final Exercise Prescription Changes):  Exercise Prescription Changes - 04/25/22 1200       Response to Exercise   Blood Pressure (Admit) 140/60    Blood Pressure (Exercise) 150/60    Blood Pressure (Exit) 140/60    Heart Rate (Admit) 5 bpm    Heart Rate (Exercise) 81 bpm    Heart Rate (Exit) 66 bpm    Rating of Perceived Exertion (Exercise) 10    Duration Continue with 30 min of aerobic exercise without signs/symptoms of physical distress.    Intensity THRR unchanged      Progression   Progression Continue to progress workloads to maintain intensity without signs/symptoms of physical distress.      Resistance Training   Training Prescription Yes    Weight 5    Reps 10-15    Time 10 Minutes      NuStep   Level 3    SPM 79    Minutes 39    METs 2.09             Nutrition:  Target Goals: Understanding of nutrition guidelines, daily intake of sodium '1500mg'$ , cholesterol '200mg'$ , calories 30% from fat and 7% or less from saturated fats, daily to have 5 or more servings of fruits and vegetables.  Biometrics:  Pre Biometrics - 03/04/22 1358       Pre Biometrics   Height '5\' 7"'$  (1.702 m)    Weight 96.7 kg    Waist Circumference 47 inches    Hip Circumference 42 inches    Waist to Hip Ratio 1.12 %    BMI (Calculated) 33.38    Triceps Skinfold 18 mm    % Body Fat 34.1 %    Grip Strength 38.36 kg     Flexibility 0 in    Single Leg Stand 0 seconds              Nutrition Therapy Plan and Nutrition Goals:  Nutrition Therapy & Goals - 03/04/22 1339       Personal Nutrition Goals  Comments Patient scored 88 on his diet assessment. Handout provided and explained regarding healthier choices. He and his wife verbalized understanding. They are interested in meeting with a dietician. A referral will be made. We offer 2 educational sessions regarding heart healthy nutrition with handouts.      Intervention Plan   Intervention Nutrition handout(s) given to patient.    Expected Outcomes Short Term Goal: Understand basic principles of dietary content, such as calories, fat, sodium, cholesterol and nutrients.             Nutrition Assessments:  Nutrition Assessments - 03/04/22 1338       MEDFICTS Scores   Pre Score 88            MEDIFICTS Score Key: ?70 Need to make dietary changes  40-70 Heart Healthy Diet ? 40 Therapeutic Level Cholesterol Diet   Picture Your Plate Scores: <83 Unhealthy dietary pattern with much room for improvement. 41-50 Dietary pattern unlikely to meet recommendations for good health and room for improvement. 51-60 More healthful dietary pattern, with some room for improvement.  >60 Healthy dietary pattern, although there may be some specific behaviors that could be improved.    Nutrition Goals Re-Evaluation:   Nutrition Goals Discharge (Final Nutrition Goals Re-Evaluation):   Psychosocial: Target Goals: Acknowledge presence or absence of significant depression and/or stress, maximize coping skills, provide positive support system. Participant is able to verbalize types and ability to use techniques and skills needed for reducing stress and depression.  Initial Review & Psychosocial Screening:  Initial Psych Review & Screening - 03/04/22 1346       Initial Review   Current issues with None Identified;Current Psychotropic Meds      Family  Dynamics   Good Support System? Yes      Barriers   Psychosocial barriers to participate in program There are no identifiable barriers or psychosocial needs.;The patient should benefit from training in stress management and relaxation.      Screening Interventions   Interventions Encouraged to exercise;Provide feedback about the scores to participant    Expected Outcomes Short Term goal: Identification and review with participant of any Quality of Life or Depression concerns found by scoring the questionnaire.             Quality of Life Scores:  Scores of 19 and below usually indicate a poorer quality of life in these areas.  A difference of  2-3 points is a clinically meaningful difference.  A difference of 2-3 points in the total score of the Quality of Life Index has been associated with significant improvement in overall quality of life, self-image, physical symptoms, and general health in studies assessing change in quality of life.  PHQ-9: Review Flowsheet       03/04/2022 01/06/2022  Depression screen PHQ 2/9  Decreased Interest 1 0  Down, Depressed, Hopeless 0 0  PHQ - 2 Score 1 0  Altered sleeping 1 -  Tired, decreased energy 3 -  Change in appetite 3 -  Feeling bad or failure about yourself  0 -  Trouble concentrating 1 -  Moving slowly or fidgety/restless 0 -  Suicidal thoughts 0 -  PHQ-9 Score 9 -  Difficult doing work/chores Somewhat difficult -   Interpretation of Total Score  Total Score Depression Severity:  1-4 = Minimal depression, 5-9 = Mild depression, 10-14 = Moderate depression, 15-19 = Moderately severe depression, 20-27 = Severe depression   Psychosocial Evaluation and Intervention:  Psychosocial Evaluation - 03/04/22 1412  Psychosocial Evaluation & Interventions   Interventions Stress management education;Relaxation education;Encouraged to exercise with the program and follow exercise prescription    Comments Patient has no psychosocial  barriers identified at his orientation visit. His PHQ-9 score was 9 mainly due to sleeping too much and having little energy. His wife was with him today and helped him answer questions. They have been married for 3 years. Patient has had 2 CVA's effecting his vision and his mood. His wife reports that after his 2nd CVA, he became combative and aggressive. They put him on Depakote to manage his behaviors. She says the medication has really helped but she thinks he hallucinates at times due to the medication. Advised her to talk to his pcp about the hallucinations. Patient is very pleasant today and seems to be motivated to do the program. 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 His wife reports that some days he does not feel good and sleeps most of the day. She also says he has a poor appetite. Patient denies any depression or anxiety. Advised his wife she needed to tell his PCP about him sleeping too much and his appetite. She verbalized understanding. Patient has a good support system with his wife and he has 2 sons that support him. Patient is ready to start the program hoping to get stronger and lose weight.    Expected Outcomes Patient will continue to have no psychosocial barriers identified.    Continue Psychosocial Services  No Follow up required             Psychosocial Re-Evaluation:  Psychosocial Re-Evaluation     [place] Name 03/21/22 1306 04/18/22 1003           Psychosocial Re-Evaluation   Current issues with Current Psychotropic Meds Current Psychotropic Meds      Comments Patient is new to the program completing 6 sessions. He continues to have no psychosocial barriers identified. He seems to enjoy the sessions and demonstrates an interest in improving his health. His mood swings continue to be managed with Depakote. Patient has completed 17 sessions. He continues to have no psychosocial barriers identified. He continues to enjoy the sessions and demonstrates  an interest in improving his health. His mood swings continue to be managed with Depakote.      Expected Outcomes Patient will continue to have no psychosocial barriers identified and his mood swings will continue to be managed. Patient will continue to have no psychosocial barriers identified and his mood swings will continue to be managed.      Interventions Encouraged to attend Cardiac Rehabilitation for the exercise;Stress management education;Relaxation education Encouraged to attend Cardiac Rehabilitation for the exercise;Stress management education;Relaxation education      Continue Psychosocial Services  No Follow up required No Follow up required               Psychosocial Discharge (Final Psychosocial Re-Evaluation):  Psychosocial Re-Evaluation - 04/18/22 1003       Psychosocial Re-Evaluation   Current issues with Current Psychotropic Meds    Comments Patient has completed 17 sessions. He continues to have no psychosocial barriers identified. He continues to enjoy the sessions and demonstrates an interest in improving his health. His mood swings continue to be managed with Depakote.    Expected Outcomes Patient will continue to have no psychosocial barriers identified and his mood swings will continue to be managed.    Interventions Encouraged to attend Cardiac Rehabilitation for the exercise;Stress management  education;Relaxation education    Continue Psychosocial Services  No Follow up required             Vocational Rehabilitation: Provide vocational rehab assistance to qualifying candidates.   Vocational Rehab Evaluation & Intervention:  Vocational Rehab - 03/04/22 1342       Initial Vocational Rehab Evaluation & Intervention   Assessment shows need for Vocational Rehabilitation No      Vocational Rehab Re-Evaulation   Comments Patient is retired and does not need voacational rehab.             Education: Education Goals: Education classes will be  provided on a weekly basis, covering required topics. Participant will state understanding/return demonstration of topics presented.  Learning Barriers/Preferences:  Learning Barriers/Preferences - 03/04/22 1340       Learning Barriers/Preferences   Learning Barriers None    Learning Preferences Audio             Education Topics: Hypertension, Hypertension Reduction -Define heart disease and high blood pressure. Discus how high blood pressure affects the body and ways to reduce high blood pressure.   Exercise and Your Heart -Discuss why it is important to exercise, the FITT principles of exercise, normal and abnormal responses to exercise, and how to exercise safely.   Angina -Discuss definition of angina, causes of angina, treatment of angina, and how to decrease risk of having angina. Flowsheet Row CARDIAC REHAB PHASE II EXERCISE from 04/27/2022 in [place]  Date 03/11/22  Educator DF  Instruction Review Code 1- Verbalizes Understanding       Cardiac Medications -Review what the following cardiac medications are used for, how they affect the body, and side effects that may occur when taking the medications.  Medications include Aspirin, Beta blockers, calcium channel blockers, ACE Inhibitors, angiotensin receptor blockers, diuretics, digoxin, and antihyperlipidemics. Flowsheet Row CARDIAC REHAB PHASE II EXERCISE from 04/27/2022 in [place]  Date 03/16/22  Educator DF  Instruction Review Code 2- Demonstrated Understanding       Congestive Heart Failure -Discuss the definition of CHF, how to live with CHF, the signs and symptoms of CHF, and how keep track of weight and sodium intake. Flowsheet Row CARDIAC REHAB PHASE II EXERCISE from 04/27/2022 in [place]  Date 03/23/22  Educator HB  Instruction Review Code 1- Verbalizes Understanding       Heart Disease and Intimacy -Discus the effect  sexual activity has on the heart, how changes occur during intimacy as we age, and safety during sexual activity. Flowsheet Row CARDIAC REHAB PHASE II EXERCISE from 04/27/2022 in [place]  Date 03/30/22  Educator DF  Instruction Review Code 2- Demonstrated Understanding       Smoking Cessation / COPD -Discuss different methods to quit smoking, the health benefits of quitting smoking, and the definition of COPD. Flowsheet Row CARDIAC REHAB PHASE II EXERCISE from 04/27/2022 in [place]  Date 04/06/22  Educator HB  Instruction Review Code 1- Verbalizes Understanding       Nutrition I: Fats -Discuss the types of cholesterol, what cholesterol does to the heart, and how cholesterol levels can be controlled. Flowsheet Row CARDIAC REHAB PHASE II EXERCISE from 04/27/2022 in [place]  Date 04/13/22  Educator HB  Instruction Review Code 1- Verbalizes Understanding       Nutrition II: Labels -Discuss the different components of food labels and how to read food label [place]  PHASE II EXERCISE from 04/27/2022 in [place]  Date 04/20/22  Educator HB  Instruction Review Code 1- Verbalizes Understanding       Heart Parts/Heart Disease and PAD -Discuss the anatomy of the heart, the pathway of blood circulation through the heart, and these are affected by heart disease.   Stress I: Signs and Symptoms -Discuss the causes of stress, how stress may lead to anxiety and depression, and ways to limit stress.   Stress II: Relaxation -Discuss different types of relaxation techniques to limit stress.   Warning Signs of Stroke / TIA -Discuss definition of a stroke, what the signs and symptoms are of a stroke, and how to identify when someone is having stroke.   Knowledge Questionnaire Score:  Knowledge Questionnaire Score - 03/04/22 1342       Knowledge Questionnaire Score    Pre Score 20/24             Core Components/Risk Factors/Patient Goals at Admission:  Personal Goals and Risk Factors at Admission - 03/04/22 1342       Core Components/Risk Factors/Patient Goals on Admission    Weight Management Obesity;Yes    Intervention Weight Management: Develop a combined nutrition and exercise program designed to reach desired caloric intake, while maintaining appropriate intake of nutrient and fiber, sodium and fats, and appropriate energy expenditure required for the weight goal.;Weight Management: Provide education and appropriate resources to help participant work on and attain dietary goals.;Weight Management/Obesity: Establish reasonable short term and long term weight goals.;Obesity: Provide education and appropriate resources to help participant work on and attain dietary goals.    Admit Weight 213 lb 9.6 oz (96.9 kg)    Goal Weight: Short Term 203 lb (92.1 kg)    Expected Outcomes Weight Maintenance: Understanding of the daily nutrition guidelines, which includes 25-35% calories from fat, 7% or less cal from saturated fats, less than '200mg'$  cholesterol, less than 1.5gm of sodium, & 5 or more servings of fruits and vegetables daily    Hypertension Yes    Intervention Provide education on lifestyle modifcations including regular physical activity/exercise, weight management, moderate sodium restriction and increased consumption of fresh fruit, vegetables, and low fat dairy, alcohol moderation, and smoking cessation.;Monitor prescription use compliance.    Expected Outcomes Short Term: Continued assessment and intervention until BP is < 140/17m HG in hypertensive participants. < 130/831mHG in hypertensive participants with diabetes, heart failure or chronic kidney disease.    Lipids Yes    Intervention Provide education and support for participant on nutrition & aerobic/resistive exercise along with prescribed medications to achieve LDL '70mg'$ , HDL >'40mg'$ .     Expected Outcomes Short Term: Participant states understanding of desired cholesterol values and is compliant with medications prescribed. Participant is following exercise prescription and nutrition guidelines.;Long Term: Cholesterol controlled with medications as prescribed, with individualized exercise RX and with personalized nutrition plan. Value goals: LDL < '70mg'$ , HDL > 40 mg.    Personal Goal Other Yes    Personal Goal Patient wants to lose weight and increase his strength and energy.    Intervention Patient will attend CR 3 days/week with exercise and education.    Expected Outcomes Patient will complete the program meeting both personal and program goals.             Core Components/Risk Factors/Patient Goals Review:   Goals and Risk Factor Review     Row Name 03/21/22 1308 04/18/22 1004  Core Components/Risk Factors/Patient Goals Review   Personal Goals Review Weight Management/Obesity;Hypertension;Lipids;Other Weight Management/Obesity;Hypertension;Lipids;Other      Review Patient was referred to CR with Stent placement. He has multiple risk factors for CAD and is participating in the program for risk modification. He has completed 6 sessions with his current weight at 217.1 lbs up 4 lbs from his initial weight. His blood pressures have been labile but he reports forgetting to take his medications some mornings. His personal goals for the program are to lose weigth and increase his strength and energy levels. We will continue to monitor his progress as he works towards meeting these goals. Patient has completed 17 sessions with his current weight at 213.1 lbs down 4 lbs since last 30 day review. His blood pressures have improved and are at goal. He is doing well in the program with consistent attendance and progressions. His personal goals for the program continue to be to lose weigth and increase his strength and energy levels. We will continue to monitor his progress as he  works towards meeting these goals.      Expected Outcomes Patient will complete the program meeting both personal and program goals. Patient will complete the program meeting both personal and program goals.               Core Components/Risk Factors/Patient Goals at Discharge (Final Review):   Goals and Risk Factor Review - 04/18/22 1004       Core Components/Risk Factors/Patient Goals Review   Personal Goals Review Weight Management/Obesity;Hypertension;Lipids;Other    Review Patient has completed 17 sessions with his current weight at 213.1 lbs down 4 lbs since last 30 day review. His blood pressures have improved and are at goal. He is doing well in the program with consistent attendance and progressions. His personal goals for the program continue to be to lose weigth and increase his strength and energy levels. We will continue to monitor his progress as he works towards meeting these goals.    Expected Outcomes Patient will complete the program meeting both personal and program goals.             ITP Comments:   Comments: ITP REVIEW Pt is making expected progress toward Cardiac Rehab goals after completing 21 sessions. Recommend continued exercise, life style modification, education, and increased stamina and strength.

## 2022-04-27 NOTE — Progress Notes (Signed)
Daily Session Note  Patient Details  Name: Jeremy [name] MRN: [id number] Date of Birth: 10/02/1945 Referring Provider:   Flowsheet Row CARDIAC REHAB PHASE II ORIENTATION from 03/04/2022 in [place]  Referring Provider Dr. Johnsie Cancel       Encounter Date: 04/27/2022  Check In:  Session Check In - 04/27/22 0812       Check-In   Supervising physician immediately available to respond to emergencies [facility] MD immediately available    Physician(s) Dr. Dellia Cloud    Location [facility]    Staff Present Leana Roe, BS, Exercise Physiologist;Marcy Bogosian BSN, RN;Debra Wynetta Emery, RN, BSN    Virtual Visit No    Medication changes reported     No    Fall or balance concerns reported    No    Tobacco Cessation No Change    Warm-up and Cool-down Performed as group-led instruction    Resistance Training Performed Yes    VAD Patient? No    PAD/SET Patient? No      Pain Assessment   Currently in Pain? No/denies    Pain Score 0-No pain    Multiple Pain Sites No             Capillary Blood Glucose: No results found for this or any previous visit (from the past 24 hour(s)).    Social History   Tobacco Use  Smoking Status Former   Packs/day: 2.00   Years: 20.00   Total pack years: 40.00   Types: Cigarettes   Quit date: 05/23/1988   Years since quitting: 33.9  Smokeless Tobacco Never  Tobacco Comments   smoked 2 packs per day for 20 years    Goals Met:  Independence with exercise equipment Exercise tolerated well No report of concerns or symptoms today Strength training completed today  Goals Unmet:  Not Applicable  Comments: check out at 9:15   Dr. Carlyle Dolly is Medical Director for [facility]

## 2022-04-29 ENCOUNTER — Encounter (HOSPITAL_COMMUNITY)
Admission: RE | Admit: 2022-04-29 | Discharge: 2022-04-29 | Disposition: A | Discharge status: Home / Self Care | Payer: PPO | Source: Ambulatory Visit | Attending: Cardiovascular Disease | Admitting: Cardiovascular Disease

## 2022-04-29 DIAGNOSIS — Z955 Presence of coronary angioplasty implant and graft: Secondary | ICD-10-CM

## 2022-04-29 NOTE — Progress Notes (Signed)
Daily Session Note  Patient Details  Name: Jeremy [name] MRN: [id number] Date of Birth: 1946/01/30 Referring Provider:   Flowsheet Row CARDIAC REHAB PHASE II ORIENTATION from 03/04/2022 in [place]  Referring Provider Dr. Johnsie Cancel       Encounter Date: 04/29/2022  Check In:  Session Check In - 04/29/22 0815       Check-In   Supervising physician immediately available to respond to emergencies [facility] MD immediately available    Physician(s) Dr. Harl Bowie    Location [facility]    Staff Present Leana Roe, BS, Exercise Physiologist;Dalton Sherrie George, MS, ACSM-CEP;Geanie Cooley, RN;Debra Johnson, RN, BSN    Virtual Visit No    Medication changes reported     No    Fall or balance concerns reported    No    Tobacco Cessation No Change    Warm-up and Cool-down Performed as group-led Higher education careers adviser Performed Yes    VAD Patient? No    PAD/SET Patient? No      Pain Assessment   Currently in Pain? No/denies    Pain Score 0-No pain    Multiple Pain Sites No             Capillary Blood Glucose: No results found for this or any previous visit (from the past 24 hour(s)).    Social History   Tobacco Use  Smoking Status Former   Packs/day: 2.00   Years: 20.00   Total pack years: 40.00   Types: Cigarettes   Quit date: 05/23/1988   Years since quitting: 33.9  Smokeless Tobacco Never  Tobacco Comments   smoked 2 packs per day for 20 years    Goals Met:  Independence with exercise equipment Exercise tolerated well No report of concerns or symptoms today Strength training completed today  Goals Unmet:  Not Applicable  Comments: check out @ 9:15am   Dr. Carlyle Dolly is Medical Director for [facility]

## 2022-05-02 ENCOUNTER — Encounter (HOSPITAL_COMMUNITY)
Admission: RE | Admit: 2022-05-02 | Discharge: 2022-05-02 | Disposition: A | Discharge status: Home / Self Care | Payer: PPO | Source: Ambulatory Visit | Attending: Cardiovascular Disease | Admitting: Cardiovascular Disease

## 2022-05-02 DIAGNOSIS — Z955 Presence of coronary angioplasty implant and graft: Secondary | ICD-10-CM | POA: Diagnosis not present

## 2022-05-02 NOTE — Progress Notes (Signed)
Daily Session Note  Patient Details  Name: Jeremy [name] MRN: [id number] Date of Birth: 01-10-1946 Referring Provider:   Flowsheet Row CARDIAC REHAB PHASE II ORIENTATION from 03/04/2022 in [place]  Referring Provider Dr. Johnsie Cancel       Encounter Date: 05/02/2022  Check In:  Session Check In - 05/02/22 0815       Check-In   Supervising physician immediately available to respond to emergencies [facility] MD immediately available    Physician(s) Dr. Domenic Polite    Location [facility]    Staff Present Leana Roe, BS, Exercise Physiologist;Dalton Sherrie George, MS, ACSM-CEP;Madelyn Flavors, RN, BSN    Virtual Visit No    Medication changes reported     No    Fall or balance concerns reported    No    Tobacco Cessation No Change    Warm-up and Cool-down Performed as group-led instruction    Resistance Training Performed Yes    VAD Patient? No    PAD/SET Patient? No      Pain Assessment   Currently in Pain? No/denies    Pain Score 0-No pain    Multiple Pain Sites No             Capillary Blood Glucose: No results found for this or any previous visit (from the past 24 hour(s)).    Social History   Tobacco Use  Smoking Status Former   Packs/day: 2.00   Years: 20.00   Total pack years: 40.00   Types: Cigarettes   Quit date: 05/23/1988   Years since quitting: 33.9  Smokeless Tobacco Never  Tobacco Comments   smoked 2 packs per day for 20 years    Goals Met:  Independence with exercise equipment Exercise tolerated well No report of concerns or symptoms today Strength training completed today  Goals Unmet:  Not Applicable  Comments: check out 0915   Dr. Carlyle Dolly is Medical Director for [facility]

## 2022-05-04 ENCOUNTER — Encounter (HOSPITAL_COMMUNITY)
Admission: RE | Admit: 2022-05-04 | Discharge: 2022-05-04 | Disposition: A | Discharge status: Home / Self Care | Payer: PPO | Source: Ambulatory Visit | Attending: Cardiovascular Disease | Admitting: Cardiovascular Disease

## 2022-05-04 DIAGNOSIS — Z955 Presence of coronary angioplasty implant and graft: Secondary | ICD-10-CM

## 2022-05-04 NOTE — Progress Notes (Signed)
Daily Session Note  Patient Details  Name: [name] MRN: [id number] Date of Birth: 04-01-1946 Referring Provider:   Flowsheet Row CARDIAC REHAB PHASE II ORIENTATION from 03/04/2022 in [place]  Referring Provider Dr. Johnsie Cancel       Encounter Date: 05/04/2022  Check In:  Session Check In - 05/04/22 0808       Check-In   Supervising physician immediately available to respond to emergencies [facility] MD immediately available    Physician(s) Dr. Domenic Polite    Location [facility]    Staff Present Leana Roe, BS, Exercise Physiologist;Dalton Sherrie George, MS, ACSM-CEP;Madelyn Flavors, RN, BSN;Hillary Troutman BSN, RN    Medication changes reported     No    Fall or balance concerns reported    No    Tobacco Cessation No Change    Warm-up and Cool-down Performed as group-led Higher education careers adviser Performed Yes    VAD Patient? No    PAD/SET Patient? No      Pain Assessment   Currently in Pain? No/denies    Pain Score 0-No pain    Multiple Pain Sites No             Capillary Blood Glucose: No results found for this or any previous visit (from the past 24 hour(s)).    Social History   Tobacco Use  Smoking Status Former   Packs/day: 2.00   Years: 20.00   Total pack years: 40.00   Types: Cigarettes   Quit date: 05/23/1988   Years since quitting: 33.9  Smokeless Tobacco Never  Tobacco Comments   smoked 2 packs per day for 20 years    Goals Met:  Independence with exercise equipment Exercise tolerated well No report of concerns or symptoms today Strength training completed today  Goals Unmet:  Not Applicable  Comments: Checkout at 0915.    Dr. Carlyle Dolly is Medical Director for [facility] Card[facility] Re[facility]

## 2022-05-06 ENCOUNTER — Encounter (HOSPITAL_COMMUNITY)
Admission: RE | Admit: 2022-05-06 | Discharge: 2022-05-06 | Disposition: A | Discharge status: Home / Self Care | Payer: PPO | Source: Ambulatory Visit | Attending: Cardiovascular Disease | Admitting: Cardiovascular Disease

## 2022-05-06 DIAGNOSIS — Z955 Presence of coronary angioplasty implant and graft: Secondary | ICD-10-CM | POA: Diagnosis not present

## 2022-05-06 NOTE — Progress Notes (Signed)
Daily Session Note  Patient Details  Name: Jeremy [name] MRN: [id number] Date of Birth: 03-14-1946 Referring Provider:   Flowsheet Row CARDIAC REHAB PHASE II ORIENTATION from 03/04/2022 in [place]  Referring Provider Dr. Johnsie Cancel       Encounter Date: 05/06/2022  Check In:  Session Check In - 05/06/22 0815       Check-In   Supervising physician immediately available to respond to emergencies [facility] MD immediately available    Physician(s) Dr. Carlyle Dolly    Location [facility]    Staff Present Leana Roe, BS, Exercise Physiologist;Dalton Sherrie George, MS, ACSM-CEP;Johney Perotti Wynetta Emery, RN, BSN    Virtual Visit No    Medication changes reported     No    Fall or balance concerns reported    No    Tobacco Cessation No Change    Warm-up and Cool-down Performed as group-led instruction    Resistance Training Performed Yes    VAD Patient? No    PAD/SET Patient? No      Pain Assessment   Currently in Pain? No/denies    Pain Score 0-No pain    Multiple Pain Sites No             Capillary Blood Glucose: No results found for this or any previous visit (from the past 24 hour(s)).    Social History   Tobacco Use  Smoking Status Former   Packs/day: 2.00   Years: 20.00   Total pack years: 40.00   Types: Cigarettes   Quit date: 05/23/1988   Years since quitting: 33.9  Smokeless Tobacco Never  Tobacco Comments   smoked 2 packs per day for 20 years    Goals Met:  Independence with exercise equipment Exercise tolerated well No report of concerns or symptoms today Strength training completed today  Goals Unmet:  Not Applicable  Comments: Check out 915.   Dr. Carlyle Dolly is Medical Director for [facility] Cardiac Rehab

## 2022-05-09 ENCOUNTER — Encounter (HOSPITAL_COMMUNITY)
Admission: RE | Admit: 2022-05-09 | Discharge: 2022-05-09 | Disposition: A | Discharge status: Home / Self Care | Payer: PPO | Source: Ambulatory Visit | Attending: Cardiovascular Disease | Admitting: Cardiovascular Disease

## 2022-05-09 DIAGNOSIS — Z955 Presence of coronary angioplasty implant and graft: Secondary | ICD-10-CM | POA: Diagnosis not present

## 2022-05-09 NOTE — Progress Notes (Signed)
Daily Session Note  Patient Details  Name: Jeremy [name] MRN: [id number] Date of Birth: 03-10-1946 Referring Provider:   Flowsheet Row CARDIAC REHAB PHASE II ORIENTATION from 03/04/2022 in [place]  Referring Provider Dr. Johnsie Cancel       Encounter Date: 05/09/2022  Check In:  Session Check In - 05/09/22 0800       Check-In   Supervising physician immediately available to respond to emergencies [facility] MD immediately available    Physician(s) Dr Dellia Cloud    Location [facility]    Staff Present Leana Roe, BS, Exercise Physiologist;Javana Schey Hassell Done, RN, BSN    Virtual Visit No    Medication changes reported     No    Fall or balance concerns reported    No    Tobacco Cessation No Change    Warm-up and Cool-down Performed as group-led instruction    Resistance Training Performed Yes    VAD Patient? No    PAD/SET Patient? No      Pain Assessment   Currently in Pain? No/denies    Pain Score 0-No pain    Multiple Pain Sites No             Capillary Blood Glucose: No results found for this or any previous visit (from the past 24 hour(s)).    Social History   Tobacco Use  Smoking Status Former   Packs/day: 2.00   Years: 20.00   Total pack years: 40.00   Types: Cigarettes   Quit date: 05/23/1988   Years since quitting: 33.9  Smokeless Tobacco Never  Tobacco Comments   smoked 2 packs per day for 20 years    Goals Met:  Independence with exercise equipment Exercise tolerated well No report of concerns or symptoms today Strength training completed today  Goals Unmet:  Not Applicable  Comments: checkout at [place].   Dr. Carlyle Dolly is Medical Director for [facility] Cardiac Rehab

## 2022-05-11 ENCOUNTER — Encounter (HOSPITAL_COMMUNITY)
Admission: RE | Admit: 2022-05-11 | Discharge: 2022-05-11 | Disposition: A | Discharge status: Home / Self Care | Payer: PPO | Source: Ambulatory Visit | Attending: Cardiovascular Disease | Admitting: Cardiovascular Disease

## 2022-05-11 DIAGNOSIS — Z955 Presence of coronary angioplasty implant and graft: Secondary | ICD-10-CM

## 2022-05-11 NOTE — Progress Notes (Signed)
Daily Session Note  Patient Details  Name: Jeremy [name] MRN: [id number] Date of Birth: 1945/10/16 Referring Provider:   Flowsheet Row CARDIAC REHAB PHASE II ORIENTATION from 03/04/2022 in [place]  Referring Provider Dr. Johnsie Cancel       Encounter Date: 05/11/2022  Check In:  Session Check In - 05/11/22 0810       Check-In   Supervising physician immediately available to respond to emergencies [facility] MD immediately available    Physician(s) Dr Dellia Cloud    Location [facility]    Staff Present Leana Roe, BS, Exercise Physiologist;[place] Sherrie George, MS, ACSM-CEP;Melven Sartorius BSN, RN    Virtual Visit No    Medication changes reported     No    Fall or balance concerns reported    No    Tobacco Cessation No Change    Warm-up and Cool-down Performed as group-led Higher education careers adviser Performed Yes    VAD Patient? No    PAD/SET Patient? No      Pain Assessment   Currently in Pain? No/denies    Pain Score 0-No pain    Multiple Pain Sites No             Capillary Blood Glucose: No results found for this or any previous visit (from the past 24 hour(s)).    Social History   Tobacco Use  Smoking Status Former   Packs/day: 2.00   Years: 20.00   Total pack years: 40.00   Types: Cigarettes   Quit date: 05/23/1988   Years since quitting: 33.9  Smokeless Tobacco Never  Tobacco Comments   smoked 2 packs per day for 20 years    Goals Met:  Independence with exercise equipment Exercise tolerated well No report of concerns or symptoms today Strength training completed today  Goals Unmet:  Not Applicable  Comments: checkout time is 0915   Dr. Carlyle Dolly is Medical Director for [facility]

## 2022-05-13 ENCOUNTER — Encounter (HOSPITAL_COMMUNITY): Payer: PPO

## 2022-05-16 ENCOUNTER — Encounter (HOSPITAL_COMMUNITY): Payer: PPO

## 2022-05-18 ENCOUNTER — Encounter (HOSPITAL_COMMUNITY)
Admission: RE | Admit: 2022-05-18 | Discharge: 2022-05-18 | Disposition: A | Discharge status: Home / Self Care | Payer: PPO | Source: Ambulatory Visit | Attending: Cardiovascular Disease | Admitting: Cardiovascular Disease

## 2022-05-18 DIAGNOSIS — Z955 Presence of coronary angioplasty implant and graft: Secondary | ICD-10-CM

## 2022-05-18 NOTE — Progress Notes (Signed)
Daily Session Note  Patient Details  Name: Jeremy [name] MRN: [id number] Date of Birth: Oct 19, 1945 Referring Provider:   Flowsheet Row CARDIAC REHAB PHASE II ORIENTATION from 03/04/2022 in [facility]  Referring Provider Dr. Johnsie Cancel       Encounter Date: 05/18/2022  Check In:  Session Check In - 05/18/22 0810       Check-In   Supervising physician immediately available to respond to emergencies [facility] MD immediately available    Physician(s) Dr. Johnsie Cancel    Location [facility]    Staff Present Melven Sartorius BSN, RN;Emmalie Haigh Sherrie George, MS, ACSM-CEP;Leana Roe, BS, Exercise Physiologist    Virtual Visit No    Medication changes reported     No    Fall or balance concerns reported    No    Tobacco Cessation No Change    Warm-up and Cool-down Performed as group-led instruction    Resistance Training Performed Yes    VAD Patient? No    PAD/SET Patient? No      Pain Assessment   Currently in Pain? No/denies    Pain Score 0-No pain    Multiple Pain Sites No             Capillary Blood Glucose: No results found for this or any previous visit (from the past 24 hour(s)).    Social History   Tobacco Use  Smoking Status Former   Packs/day: 2.00   Years: 20.00   Total pack years: 40.00   Types: Cigarettes   Quit date: 05/23/1988   Years since quitting: 34.0  Smokeless Tobacco Never  Tobacco Comments   smoked 2 packs per day for 20 years    Goals Met:  Independence with exercise equipment Exercise tolerated well No report of concerns or symptoms today Strength training completed today  Goals Unmet:  Not Applicable  Comments: checkout time is 0915   Dr. Carlyle Dolly is Medical Director for [place]

## 2022-05-20 ENCOUNTER — Encounter (HOSPITAL_COMMUNITY)
Admission: RE | Admit: 2022-05-20 | Discharge: 2022-05-20 | Disposition: A | Discharge status: Home / Self Care | Payer: PPO | Source: Ambulatory Visit | Attending: Cardiovascular Disease | Admitting: Cardiovascular Disease

## 2022-05-20 DIAGNOSIS — Z955 Presence of coronary angioplasty implant and graft: Secondary | ICD-10-CM | POA: Diagnosis not present

## 2022-05-20 NOTE — Progress Notes (Signed)
Daily Session Note  Patient Details  Name: Jeremy [name] MRN: [id number] Date of Birth: 09/04/1945 Referring Provider:   Flowsheet Row CARDIAC REHAB PHASE II ORIENTATION from 03/04/2022 in [facility]  Referring Provider Dr. Johnsie Cancel       Encounter Date: 05/20/2022  Check In:  Session Check In - 05/20/22 0814       Check-In   Supervising physician immediately available to respond to emergencies [facility] MD immediately available    Physician(s) Dr. Johnsie Cancel    Location [facility]    Staff Present Leana Roe, BS, Exercise Physiologist;[place] Sherrie George, MS, ACSM-CEP    Virtual Visit No    Medication changes reported     No    Fall or balance concerns reported    No    Tobacco Cessation No Change    Warm-up and Cool-down Performed as group-led instruction    Resistance Training Performed Yes    VAD Patient? No    PAD/SET Patient? No      Pain Assessment   Currently in Pain? No/denies    Pain Score 0-No pain    Multiple Pain Sites No             Capillary Blood Glucose: No results found for this or any previous visit (from the past 24 hour(s)).    Social History   Tobacco Use  Smoking Status Former   Packs/day: 2.00   Years: 20.00   Total pack years: 40.00   Types: Cigarettes   Quit date: 05/23/1988   Years since quitting: 34.0  Smokeless Tobacco Never  Tobacco Comments   smoked 2 packs per day for 20 years    Goals Met:  Independence with exercise equipment Exercise tolerated well No report of concerns or symptoms today Strength training completed today  Goals Unmet:  Not Applicable  Comments: check out 0915   Dr. Carlyle Dolly is Medical Director for [place]

## 2022-05-23 ENCOUNTER — Encounter (HOSPITAL_COMMUNITY): Payer: PPO

## 2022-05-25 ENCOUNTER — Encounter (HOSPITAL_COMMUNITY)
Admission: RE | Admit: 2022-05-25 | Discharge: 2022-05-25 | Disposition: A | Discharge status: Home / Self Care | Payer: PPO | Source: Ambulatory Visit | Attending: Cardiovascular Disease | Admitting: Cardiovascular Disease

## 2022-05-25 DIAGNOSIS — Z955 Presence of coronary angioplasty implant and graft: Secondary | ICD-10-CM | POA: Diagnosis not present

## 2022-05-25 NOTE — Progress Notes (Signed)
Daily Session Note  Patient Details  Name: Jeremy [name] MRN: [id number] Date of Birth: 06-04-1945 Referring Provider:   Flowsheet Row CARDIAC REHAB PHASE II ORIENTATION from 03/04/2022 in [facility]  Referring Provider Dr. Johnsie Cancel       Encounter Date: 05/25/2022  Check In:  Session Check In - 05/25/22 0815       Check-In   Supervising physician immediately available to respond to emergencies [facility] MD immediately available    Physician(s) Dr. Domenic Polite    Location [facility]    Staff Present Melven Sartorius BSN, RN;Joseguadalupe Stan Sherrie George, MS, ACSM-CEP;Leana Roe, BS, Exercise Physiologist    Virtual Visit No    Medication changes reported     No    Fall or balance concerns reported    No    Tobacco Cessation No Change    Warm-up and Cool-down Performed as group-led instruction    Resistance Training Performed Yes    VAD Patient? No    PAD/SET Patient? No      Pain Assessment   Currently in Pain? No/denies    Pain Score 0-No pain    Multiple Pain Sites No             Capillary Blood Glucose: No results found for this or any previous visit (from the past 24 hour(s)).    Social History   Tobacco Use  Smoking Status Former   Packs/day: 2.00   Years: 20.00   Total pack years: 40.00   Types: Cigarettes   Quit date: 05/23/1988   Years since quitting: 34.0  Smokeless Tobacco Never  Tobacco Comments   smoked 2 packs per day for 20 years    Goals Met:  Independence with exercise equipment Exercise tolerated well No report of concerns or symptoms today Strength training completed today  Goals Unmet:  Not Applicable  Comments: checkout time is 0915   Dr. Carlyle Dolly is Medical Director for [place]

## 2022-05-27 ENCOUNTER — Encounter (HOSPITAL_COMMUNITY)
Admission: RE | Admit: 2022-05-27 | Discharge: 2022-05-27 | Disposition: A | Discharge status: Home / Self Care | Payer: PPO | Source: Ambulatory Visit | Attending: Cardiovascular Disease | Admitting: Cardiovascular Disease

## 2022-05-27 VITALS — Ht 67.0 in | Wt 209.9 lb

## 2022-05-27 DIAGNOSIS — Z955 Presence of coronary angioplasty implant and graft: Secondary | ICD-10-CM | POA: Diagnosis not present

## 2022-05-27 NOTE — Progress Notes (Signed)
Daily Session Note  Patient Details  Name: Jeremy [name] MRN: [id number] Date of Birth: [birth year] Referring Provider:   Flowsheet Row CARDIAC REHAB PHASE II ORIENTATION from 03/04/2022 in [facility]  Referring Provider Dr. Johnsie Cancel       Encounter Date: 05/27/2022  Check In:  Session Check In - 05/27/22 0815       Check-In   Supervising physician immediately available to respond to emergencies [facility] MD immediately available    Physician(s) Dr. Domenic Polite    [facility]    Staff Present Leana Roe, BS, Exercise Physiologist;Hanny Elsberry, RN;Dalton Sherrie George, MS, ACSM-CEP;Debra Wynetta Emery, RN, BSN    Virtual Visit No    Medication changes reported     No    Fall or balance concerns reported    No    Tobacco Cessation No Change    Warm-up and Cool-down Performed as group-led instruction    Resistance Training Performed Yes    VAD Patient? No    PAD/SET Patient? No      Pain Assessment   Currently in Pain? No/denies    Pain Score 0-No pain    Multiple Pain Sites No             Capillary Blood Glucose: No results found for this or any previous visit (from the past 24 hour(s)).    Social History   Tobacco Use  Smoking Status Former   Packs/day: 2.00   Years: 20.00   Total pack years: 40.00   Types: Cigarettes   Quit date: 05/23/1988   Years since quitting: 34.0  Smokeless Tobacco Never  Tobacco Comments   smoked 2 packs per day for 20 years    Goals Met:  Independence with exercise equipment Exercise tolerated well No report of concerns or symptoms today Strength training completed today  Goals Unmet:  Not Applicable  Comments: check out @ 9:15am   Dr. Carlyle Dolly is Medical Director for [place]

## 2022-05-31 DIAGNOSIS — I10 Essential (primary) hypertension: Secondary | ICD-10-CM | POA: Diagnosis not present

## 2022-05-31 DIAGNOSIS — E119 Type 2 diabetes mellitus without complications: Secondary | ICD-10-CM | POA: Diagnosis not present

## 2022-05-31 NOTE — Progress Notes (Signed)
Cardiology Office Note    Date:  06/06/2022   ID:  Jeremy [name], DOB [birth year], MRN [id number]  PCP:  Celene Squibb, MD  Cardiologist: Jenkins Rouge, MD     History of Present Illness:    77 y.o. previously seen by Dr Julianne Rice. History of CAD with stenting of the CircumlfexLAD. CRF;s HTN, HLD. PVD with history CVA and right CEA   NSTEMI 03/25/19 peak troponin 5345 ICLBBB on ECG  Cath by Dr. Angelena Form on 03/26/2019 and showed 50% prox-RCA stenosis, 60% dRCA, 50% RPDA, 90% prox LCx, 99% Prox-LAD, 70% Distal LCx, 60% 2nd Mrg, 80% 2nd Diagonal and preserved EF of 50-55%. Was treated with PTCA/DESx1 to the LAD and successful scoring balloon angioplasty in the previously stented segment of LCx.   Life long DAT with stents , restenosis and CVA   Admitted 09/18/20 with chest pain diaphoresis r/o TTE normal EF no RWMAls And no acute ECG changes Cr elevated 1.9 from baseline 1.6 ACE d/c and imdur started   Admitted 11/14/21 with angina old LBBB and negative troponins .  CAth with patent stents to proximal LAD and LCX Had 80% mid RCA lesion with new stent. Has residual small vessel disease in OM and diagonals EF preserved by TTE 60-65% 11/14/21 Carotids with plaque no stenosis Had ? Embolic stroke post cath with aphasia, confusion and combativeness Was reloaded with Depakote MRI with right PCA stroke   Long discussion about need for weight loss He finished cardiac rehab 05/27/22   Seems to have some depression or cognitive slowing from stroke Wife complains that he has fatigue and can sleep for days   Past Medical History:  Diagnosis Date   Arthritis    Basal cell carcinoma 02/11/2021   nod- left dorsal hand (EXC)   CAD (coronary artery disease)    a. s/p prior stenting of LAD and RCA b. 03/2019: NSTEMI and required PTCA/DESx1 to the LAD and successful scoring balloon angioplasty in the previously stented segment of LCx   Carotid stenosis, asymptomatic, right    Carpal tunnel syndrome     bilateral   Coronary artery disease    GERD (gastroesophageal reflux disease)    History of kidney stones    Hx of colonic polyp    Hypercholesterolemia    Hypertension    Myocardial infarction ([place])    hx of   Obesity    SCC (squamous cell carcinoma) 02/11/2021   in situ- right temple (CX35FU)   Squamous cell carcinoma of skin 08/09/2016   well differentiated on right outer eye - tx p bx   Stroke [facility])     Past Surgical History:  Procedure Laterality Date   CARDIAC CATHETERIZATION     CATARACT EXTRACTION W/PHACO Left 03/12/2021   Procedure: CATARACT EXTRACTION PHACO AND INTRAOCULAR LENS PLACEMENT ([place]);  Surgeon: Baruch Goldmann, MD;  Location: [facility];  Service: Ophthalmology;  Laterality: Left;   CDE: 14.58   CATARACT EXTRACTION W/PHACO Right 04/08/2021   Procedure: CATARACT EXTRACTION PHACO AND INTRAOCULAR LENS PLACEMENT RIGHT EYE;  Surgeon: Baruch Goldmann, MD;  Location: [facility];  Service: Ophthalmology;  Laterality: Right;  CDE 10.94   CORONARY BALLOON ANGIOPLASTY N/A 03/26/2019   Procedure: CORONARY BALLOON ANGIOPLASTY;  Surgeon: Burnell Blanks, MD;  Location: [place] CV LAB;  Service: Cardiovascular;  Laterality: N/A;   CORONARY STENT INTERVENTION N/A 03/26/2019   Procedure: CORONARY STENT INTERVENTION;  Surgeon: Burnell Blanks, MD;  Location: [place] [facility];  Service: Cardiovascular;  Laterality:  N/A;   CORONARY STENT INTERVENTION N/A 11/15/2021   Procedure: CORONARY STENT INTERVENTION;  Surgeon: Lorretta Harp, MD;  Location: [place] [facility];  Service: Cardiovascular;  Laterality: N/A;   CORONARY STENT PLACEMENT  2000   x3    CYST EXCISION     x 2   ENDARTERECTOMY Right 12/25/2017   Procedure: ENDARTERECTOMY CAROTID RIGHT;  Surgeon: Rosetta Posner, MD;  Location: [facility];  Service: Vascular;  Laterality: Right;   LEFT HEART CATH AND CORONARY ANGIOGRAPHY N/A 03/26/2019   Procedure: LEFT HEART CATH AND CORONARY ANGIOGRAPHY;  Surgeon: Burnell Blanks, MD;  Location: [place] [facility];  Service: Cardiovascular;  Laterality: N/A;   LEFT HEART CATH AND CORONARY ANGIOGRAPHY N/A 11/15/2021   Procedure: LEFT HEART CATH AND CORONARY ANGIOGRAPHY;  Surgeon: Lorretta Harp, MD;  Location: [facility] LAB;  Service: Cardiovascular;  Laterality: N/A;   PATCH ANGIOPLASTY Right 12/25/2017   Procedure: PATCH ANGIOPLASTY RIGHT CAROTID ARTERY;  Surgeon: Rosetta Posner, MD;  Location: [facility];  Service: Vascular;  Laterality: Right;   TEAR DUCT PROBING Bilateral 07/2020   done at [facility]. Dr. Norlene Duel   TEE WITHOUT CARDIOVERSION N/A 06/29/2018   Procedure: TRANSESOPHAGEAL ECHOCARDIOGRAM (TEE);  Surgeon: Fay Records, MD;  [facility] ENDOSCOPY;  Service: Cardiovascular;  Laterality: N/A;    Current Medications: Outpatient Medications Prior to Visit  Medication Sig Dispense Refill   acetaminophen (TYLENOL) 500 MG tablet Take 500 mg by mouth every 6 (six) hours as needed for mild pain.     aspirin EC 81 MG EC tablet Take 1 tablet (81 mg total) by mouth daily. 90 tablet 3   carvedilol (COREG) 12.5 MG tablet Take 1 tablet (12.5 mg total) by mouth 2 (two) times daily with a meal. 180 tablet 3   clopidogrel (PLAVIX) 75 MG tablet Take 1 tablet by mouth once daily (Patient taking differently: Take 75 mg by mouth every evening.) 90 tablet 3   divalproex (DEPAKOTE) 500 MG DR tablet Take 1 tablet (500 mg total) by mouth every 12 (twelve) hours. 180 tablet 3   gabapentin (NEURONTIN) 300 MG capsule Take 1 capsule (300 mg total) by mouth 3 (three) times daily. 90 capsule 6   isosorbide mononitrate (IMDUR) 30 MG 24 hr tablet Take 1 tablet by mouth once daily (Patient taking differently: Take 30 mg by mouth daily after breakfast.) 90 tablet 1   losartan (COZAAR) 50 MG tablet Take 1 tablet (50 mg total) by mouth daily. 90 tablet 3   nitroGLYCERIN (NITROSTAT) 0.4 MG SL tablet Place 1 tablet (0.4 mg total) under the tongue every 5 (five) minutes as needed for  chest pain. 25 tablet 3   nystatin (MYCOSTATIN/NYSTOP) powder Apply 1 Application topically 2 (two) times daily as needed (skin irritation/rash.).     nystatin cream (MYCOSTATIN) Apply 1 Application topically 2 (two) times daily as needed (severe irritation/rash).     oxyCODONE (ROXICODONE) 15 MG immediate release tablet Take 15 mg by mouth every 6 (six) hours as needed for pain.     pantoprazole (PROTONIX) 40 MG tablet Take 1 tablet (40 mg total) by mouth daily. 90 tablet 3   RESTASIS MULTIDOSE 0.05 % ophthalmic emulsion Place 1 drop into both eyes 2 (two) times daily as needed (dry/irritated eyes.).     rosuvastatin (CRESTOR) 40 MG tablet Take 40 mg by mouth at bedtime.     XIIDRA 5 % SOLN Place 1 drop into both eyes daily as needed (  dry eye).     No facility-administered medications prior to visit.     Allergies:   Codeine, Oxycontin [oxycodone hcl], Contrast media [iodinated contrast media], and Morphine and related   Social History   Socioeconomic History   Marital status: Divorced    Spouse name: Not on file   Number of children: Not on file   Years of education: Not on file   Highest education level: Not on file  Occupational History   Occupation: retired    Comment: welder  Tobacco Use   Smoking status: Former    Packs/day: 2.00    Years: 20.00    Total pack years: 40.00    Types: Cigarettes    Quit date: 05/23/1988    Years since quitting: 34.0   Smokeless tobacco: Never   Tobacco comments:    smoked 2 packs per day for 20 years  Vaping Use   Vaping Use: Never used  Substance and Sexual Activity   Alcohol use: No   Drug use: No   Sexual activity: Yes  Other Topics Concern   Not on file  Social History Narrative   Not on file   Social Determinants of Health   Financial Resource Strain: Not on file  Food Insecurity: Not on file  Transportation Needs: Not on file  Physical Activity: Not on file  Stress: Not on file  Social Connections: Not on file      Family History:  The patient's family history includes Coronary artery disease in his brother; Coronary artery disease (age of onset: 57) in his mother; Hypertension in his mother; Other (age of onset: 24) in his father; Other (age of onset: 65) in his sister; Skin cancer in his brother.   Review of Systems:   Please see the history of present illness.     General:  No chills, fever, night sweats or weight changes. Positive for bilateral shoulder pain (occurring for several months). Cardiovascular:  No chest pain, dyspnea on exertion, edema, orthopnea, palpitations, paroxysmal nocturnal dyspnea. Dermatological: No rash, lesions/masses Respiratory: No cough, dyspnea Urologic: No hematuria, dysuria Abdominal:   No nausea, vomiting, diarrhea, bright red blood per rectum, melena, or hematemesis Neurologic:  No visual changes, wkns, changes in mental status. All other systems reviewed and are otherwise negative except as noted above.   Physical Exam:    VS:  BP 112/66   Pulse (!) 58   Ht '5\' 7"'$  (1.702 m)   Wt 207 lb (93.9 kg)   SpO2 95%   BMI 32.42 kg/m     Affect appropriate Healthy:  appears stated age HEENT: normal Neck supple with no adenopathy JVP normal right CEA s no thyromegaly Lungs clear with no wheezing and good diaphragmatic motion Heart:  S1/S2 no murmur, no rub, gallop or click PMI normal Abdomen: benighn, BS positve, no tenderness, no AAA no bruit.  No HSM or HJR Distal pulses intact with no bruits No edema Neuro non-focal Skin warm and dry No muscular weakness Restricted ROM both shoulders    Wt Readings from Last 3 Encounters:  06/06/22 207 lb (93.9 kg)  05/27/22 209 lb 14.1 oz (95.2 kg)  05/09/22 210 lb 5.1 oz (95.4 kg)     Studies/Labs Reviewed:   EKG:   NSR, HR 65 with anterior infarct pattern and diffuse TWI along the lateral leads which has improved when compared to his prior tracing.   Recent Labs: 11/13/2021: Magnesium 2.1 11/15/2021: TSH  2.585 11/18/2021: ALT 18 11/26/2021: Hemoglobin 13.2; Platelets  197 12/06/2021: BUN 23; Creatinine, Ser 1.39; Potassium 4.6; Sodium 142   Lipid Panel    Component Value Date/Time   CHOL 124 11/14/2021 0245   CHOL 125 02/11/2019 0847   TRIG 164 (H) 11/14/2021 0245   HDL 32 (L) 11/14/2021 0245   HDL 45 02/11/2019 0847   CHOLHDL 3.9 11/14/2021 0245   VLDL 33 11/14/2021 0245   LDLCALC 59 11/14/2021 0245   LDLCALC 64 02/11/2019 0847    Additional studies/ records that were reviewed today include:   Cardiac Catheterization: 11/15/21  Diagnostic Dominance: Right Left Anterior Descending  Vessel is large.  Non-stenotic Prox LAD lesion was previously treated.    Second Diagonal Branch  Vessel is small in size.  2nd Diag lesion is 80% stenosed.    Left Circumflex  Vessel is large.  Prox Cx to Mid Cx lesion is 20% stenosed. The lesion was previously treated using a stent (unknown type) over 2 years ago.  Dist Cx lesion is 70% stenosed.    Second Obtuse Marginal Branch  Vessel is moderate in size.  2nd Mrg lesion is 60% stenosed.    Third Obtuse Marginal Branch  Vessel is moderate in size.  3rd Mrg lesion is 90% stenosed.    Right Coronary Artery  Vessel is large.  Prox RCA to Mid RCA lesion is 80% stenosed. The lesion was previously treated .  Dist RCA lesion is 90% stenosed.    Right Posterior Descending Artery  RPDA lesion is 50% stenosed.    Intervention   Prox RCA to Mid RCA lesion  Stent  Lesion crossed with guidewire. Pre-stent angioplasty was performed. A drug-eluting stent was successfully placed using a SYNERGY XD [id number]. Stent strut is well apposed. Stent overlaps previously placed stent. Post-stent angioplasty was performed.  Post-Intervention Lesion Assessment  The intervention was successful. Pre-interventional TIMI flow is 3. Post-intervention TIMI flow is 3. No-reflow occurred during the intervention.  There is a 0% residual stenosis post intervention.     Dist RCA lesion  Stent  Lesion crossed with guidewire. Pre-stent angioplasty was performed. A stent was successfully placed. Stent strut is well apposed. Stent does not overlap previously placed stentPost-stent angioplasty was not performed.  Post-Intervention Lesion Assessment  The intervention was successful. Pre-interventional TIMI flow is 3. Post-intervention TIMI flow is 3. No complications occurred at this lesion.  There is a 0% residual stenosis post intervention.      Echo 11/14/21   IMPRESSIONS     1. Left ventricular ejection fraction, by estimation, is 60 to 65%. The  left ventricle has normal function. The left ventricle has no regional  wall motion abnormalities. There is mild left ventricular hypertrophy.  Left ventricular diastolic parameters  are consistent with Grade I diastolic dysfunction (impaired relaxation).  The average left ventricular global longitudinal strain is -13.3 %. The  global longitudinal strain is abnormal.   2. Right ventricular systolic function is normal. The right ventricular  size is normal.   3. The mitral valve is normal in structure. Trivial mitral valve  regurgitation. No evidence of mitral stenosis.   4. The aortic valve is tricuspid. Aortic valve regurgitation is not  visualized. No aortic stenosis is present.   5. The inferior vena cava is normal in size with greater than 50%  respiratory variability, suggesting right atrial pressure of 3 mmHg.   Comparison(s): No significant change from prior study. Prior images  reviewed side by side. 08/23/20-EF 60-65%.    Plan:   In order of  problems listed above:  1. CAD:  Distant history of RCA stent and 03/2019 intervention to LAD and circumflex  Cath 11/14/21 restenosis of stents to mid/distal RCA with repeat intervention Residual small diagonal and OM dx stents to proximal LAD and mid LCX patent Continue medical Rx Finished cardiac rehab 05/27/22 Life long DAT given number of stents   2. HTN  Well controlled.  Continue current medications and low sodium Dash type diet.    3. HLD  LDL 59 11/14/21  continue crestor    4. History of CVA and another post cath cerebellar June 2023 carotids ok on Depakote f/u with neuro   5. CRF:  Baseline Cr 1.4    6. Fatigue:  non cardiac F/U primary consider SSRI. ? Also does he still need to be on Depakote and Neurontin post stroke Will f/u with Dr Nevada Crane    F/U  6 months [place]    Signed, Jenkins Rouge, MD  06/06/2022 8:39 AM    [facility] [address] [place], [place] [address] Phone: [phone] Fax: [phone]

## 2022-05-31 NOTE — Progress Notes (Signed)
Discharge Progress Report  Patient Details  Name: Jeremy [name] MRN: [id number] Date of Birth: 1946-02-13 Referring Provider:   Flowsheet Row CARDIAC REHAB PHASE II ORIENTATION from 03/04/2022 in [place]  Referring Provider Dr. Johnsie Cancel        Number of Visits: 31  Reason for Discharge:  Patient reached a stable level of exercise. Patient independent in their exercise. Patient has met program and personal goals.  Smoking History:  Social History   Tobacco Use  Smoking Status Former   Packs/day: 2.00   Years: 20.00   Total pack years: 40.00   Types: Cigarettes   Quit date: 05/23/1988   Years since quitting: 34.0  Smokeless Tobacco Never  Tobacco Comments   smoked 2 packs per day for 20 years    Diagnosis:  S/P drug eluting coronary stent placement  ADL [facility]:   Initial Exercise Prescription:  Initial Exercise Prescription - 03/04/22 1300       Date of Initial Exercise RX and Referring Provider   Date 03/04/22    Referring Provider Dr. Johnsie Cancel      Treadmill   MPH 1.3    Grade 0    Minutes 17      NuStep   Level 1    SPM 60    Minutes 22      Prescription Details   Frequency (times per week) 3    Duration Progress to 30 minutes of continuous aerobic without signs/symptoms of physical distress      Intensity   THRR 40-80% of Max Heartrate 58-116    Ratings of Perceived Exertion 11-13      Resistance Training   Training Prescription Yes    Weight 3    Reps 10-15             Discharge Exercise Prescription (Final Exercise Prescription Changes):  Exercise Prescription Changes - 05/09/22 1000       Response to Exercise   Blood Pressure (Admit) 120/50    Blood Pressure (Exercise) 128/58    Blood Pressure (Exit) 96/54    Heart Rate (Admit) 51 bpm    Heart Rate (Exercise) 76 bpm    Heart Rate (Exit) 60 bpm    Rating of Perceived Exertion (Exercise) 12    Duration Continue with 30 min of aerobic exercise without  signs/symptoms of physical distress.    Intensity THRR unchanged      Progression   Progression Continue to progress workloads to maintain intensity without signs/symptoms of physical distress.      Resistance Training   Training Prescription Yes    Weight 5    Reps 10-15    Time 10 Minutes      NuStep   Level 3    SPM 74    Minutes 39    METs 2.02             Functional Capacity:  6 Minute Walk     Row Name 03/04/22 1355 05/27/22 1038       6 Minute Walk   Phase Initial Discharge    Distance 1200 feet 1000 feet    Walk Time 6 minutes 6 minutes    # of Rest Breaks 0 2    MPH 2.27 1.89    METS 2.05 1.65    RPE 13 13    VO2 Peak 7.19 5.79    Symptoms Yes (comment) Yes (comment)    Comments Bilateral hip pain while walking (6/10) Two standing breaks 25  seconds due to bilateral hip pain (8/10) while walking    Resting HR 61 bpm 57 bpm    Resting BP 100/50 130/60    Resting Oxygen Saturation  94 % 97 %    Exercise Oxygen Saturation  during 6 min walk 96 % 96 %    Max Ex. HR 88 bpm 80 bpm    Max Ex. BP 140/58 140/60    2 Minute Post BP 120/52 122/60             Psychological, QOL, Others - Outcomes: PHQ 2/9:    05/31/2022    9:26 AM 03/04/2022    1:38 PM 01/06/2022    8:46 AM  Depression screen PHQ 2/9  Decreased Interest 1 1 0  Down, Depressed, Hopeless 0 0 0  PHQ - 2 Score 1 1 0  Altered sleeping 2 1   Tired, decreased energy 2 3   Change in appetite 1 3   Feeling bad or failure about yourself  0 0   Trouble concentrating 0 1   Moving slowly or fidgety/restless 1 0   Suicidal thoughts 0 0   PHQ-9 Score 7 9   Difficult doing work/chores Somewhat difficult Somewhat difficult     Quality of Life:  Quality of Life - 05/27/22 1038       Quality of Life   Select Quality of Life      Quality of Life Scores   Health/Function Post 25.71 %    Socioeconomic Post 28 %    Psych/Spiritual Post 27.43 %    Family Post 30 %    GLOBAL Post 27.19 %              Personal Goals: Goals established at orientation with interventions provided to work toward goal.  Personal Goals and Risk Factors at Admission - 03/04/22 1342       Core Components/Risk Factors/Patient Goals on Admission    Weight Management Obesity;Yes    Intervention Weight Management: Develop a combined nutrition and exercise program designed to reach desired caloric intake, while maintaining appropriate intake of nutrient and fiber, sodium and fats, and appropriate energy expenditure required for the weight goal.;Weight Management: Provide education and appropriate resources to help participant work on and attain dietary goals.;Weight Management/Obesity: Establish reasonable short term and long term weight goals.;Obesity: Provide education and appropriate resources to help participant work on and attain dietary goals.    Admit Weight 213 lb 9.6 oz (96.9 kg)    Goal Weight: Short Term 203 lb (92.1 kg)    Expected Outcomes Weight Maintenance: Understanding of the daily nutrition guidelines, which includes 25-35% calories from fat, 7% or less cal from saturated fats, less than '200mg'$  cholesterol, less than 1.5gm of sodium, & 5 or more servings of fruits and vegetables daily    Hypertension Yes    Intervention Provide education on lifestyle modifcations including regular physical activity/exercise, weight management, moderate sodium restriction and increased consumption of fresh fruit, vegetables, and low fat dairy, alcohol moderation, and smoking cessation.;Monitor prescription use compliance.    Expected Outcomes Short Term: Continued assessment and intervention until BP is < 140/50m HG in hypertensive participants. < 130/829mHG in hypertensive participants with diabetes, heart failure or chronic kidney disease.    Lipids Yes    Intervention Provide education and support for participant on nutrition & aerobic/resistive exercise along with prescribed medications to achieve LDL '70mg'$ ,  HDL >'40mg'$ .    Expected Outcomes Short Term: Participant states understanding of desired  cholesterol values and is compliant with medications prescribed. Participant is following exercise prescription and nutrition guidelines.;Long Term: Cholesterol controlled with medications as prescribed, with individualized exercise RX and with personalized nutrition plan. Value goals: LDL < '70mg'$ , HDL > 40 mg.    Personal Goal Other Yes    Personal Goal Patient wants to lose weight and increase his strength and energy.    Intervention Patient will attend CR 3 days/week with exercise and education.    Expected Outcomes Patient will complete the program meeting both personal and program goals.              Personal Goals Discharge:  Goals and Risk Factor Review     Row Name 03/21/22 1308 04/18/22 1004 05/31/22 0931         Core Components/Risk Factors/Patient Goals Review   Personal Goals Review Weight Management/Obesity;Hypertension;Lipids;Other Weight Management/Obesity;Hypertension;Lipids;Other Weight Management/Obesity;Hypertension;Lipids;Other     Review Patient was referred to CR with Stent placement. He has multiple risk factors for CAD and is participating in the program for risk modification. He has completed 6 sessions with his current weight at 217.1 lbs up 4 lbs from his initial weight. His blood pressures have been labile but he reports forgetting to take his medications some mornings. His personal goals for the program are to lose weigth and increase his strength and energy levels. We will continue to monitor his progress as he works towards meeting these goals. Patient has completed 17 sessions with his current weight at 213.1 lbs down 4 lbs since last 30 day review. His blood pressures have improved and are at goal. He is doing well in the program with consistent attendance and progressions. His personal goals for the program continue to be to lose weigth and increase his strength and energy  levels. We will continue to monitor his progress as he works towards meeting these goals. Pt graduated from CR after 31 sessions. His weight was stable while in the program. His vitals were WNLs outside of a couple of times where he forgot to take his medicine. His attenance was consistent, and he gave adequate effort, although, at times it seemed like he could have pushed himself harder. He made improvements in his stamina level, but his walk test distance actually decreased. He has problems with his tail bone and his hips. He was in a lot of pain during his discharge walk test and had a noticeable limp.     Expected Outcomes Patient will complete the program meeting both personal and program goals. Patient will complete the program meeting both personal and program goals. Pt will continue to work towards their goals post discharge.              Exercise Goals and Review:  Exercise Goals     Row Name 03/04/22 1357 03/28/22 1040 04/25/22 1252         Exercise Goals   Increase Physical Activity Yes Yes Yes     Intervention Provide advice, education, support and counseling about physical activity/exercise needs.;Develop an individualized exercise prescription for aerobic and resistive training based on initial evaluation findings, risk stratification, comorbidities and participant's personal goals. Provide advice, education, support and counseling about physical activity/exercise needs.;Develop an individualized exercise prescription for aerobic and resistive training based on initial evaluation findings, risk stratification, comorbidities and participant's personal goals. Provide advice, education, support and counseling about physical activity/exercise needs.;Develop an individualized exercise prescription for aerobic and resistive training based on initial evaluation findings, risk stratification, comorbidities and participant's  personal goals.     Expected Outcomes Short Term: Attend rehab on a  regular basis to increase amount of physical activity.;Long Term: Add in home exercise to make exercise part of routine and to increase amount of physical activity.;Long Term: Exercising regularly at least 3-5 days a week. Short Term: Attend rehab on a regular basis to increase amount of physical activity.;Long Term: Add in home exercise to make exercise part of routine and to increase amount of physical activity.;Long Term: Exercising regularly at least 3-5 days a week. Short Term: Attend rehab on a regular basis to increase amount of physical activity.;Long Term: Add in home exercise to make exercise part of routine and to increase amount of physical activity.;Long Term: Exercising regularly at least 3-5 days a week.     Increase Strength and Stamina Yes Yes Yes     Intervention Provide advice, education, support and counseling about physical activity/exercise needs.;Develop an individualized exercise prescription for aerobic and resistive training based on initial evaluation findings, risk stratification, comorbidities and participant's personal goals. Provide advice, education, support and counseling about physical activity/exercise needs.;Develop an individualized exercise prescription for aerobic and resistive training based on initial evaluation findings, risk stratification, comorbidities and participant's personal goals. Provide advice, education, support and counseling about physical activity/exercise needs.;Develop an individualized exercise prescription for aerobic and resistive training based on initial evaluation findings, risk stratification, comorbidities and participant's personal goals.     Expected Outcomes Short Term: Increase workloads from initial exercise prescription for resistance, speed, and METs.;Short Term: Perform resistance training exercises routinely during rehab and add in resistance training at home;Long Term: Improve cardiorespiratory fitness, muscular endurance and strength as  measured by increased METs and functional capacity (6MWT) Short Term: Increase workloads from initial exercise prescription for resistance, speed, and METs.;Short Term: Perform resistance training exercises routinely during rehab and add in resistance training at home;Long Term: Improve cardiorespiratory fitness, muscular endurance and strength as measured by increased METs and functional capacity (6MWT) Short Term: Increase workloads from initial exercise prescription for resistance, speed, and METs.;Short Term: Perform resistance training exercises routinely during rehab and add in resistance training at home;Long Term: Improve cardiorespiratory fitness, muscular endurance and strength as measured by increased METs and functional capacity (6MWT)     Able to understand and use rate of perceived exertion (RPE) scale Yes Yes Yes     Intervention Provide education and explanation on how to use RPE scale Provide education and explanation on how to use RPE scale --     Expected Outcomes Long Term:  Able to use RPE to guide intensity level when exercising independently;Short Term: Able to use RPE daily in rehab to express subjective intensity level Long Term:  Able to use RPE to guide intensity level when exercising independently;Short Term: Able to use RPE daily in rehab to express subjective intensity level Long Term:  Able to use RPE to guide intensity level when exercising independently;Short Term: Able to use RPE daily in rehab to express subjective intensity level     Knowledge and understanding of Target Heart Rate Range (THRR) Yes Yes Yes     Intervention Provide education and explanation of THRR including how the numbers were predicted and where they are located for reference Provide education and explanation of THRR including how the numbers were predicted and where they are located for reference Provide education and explanation of THRR including how the numbers were predicted and where they are located  for reference     Expected Outcomes Short  Term: Able to state/look up THRR;Short Term: Able to use daily as guideline for intensity in rehab;Long Term: Able to use THRR to govern intensity when exercising independently Short Term: Able to state/look up THRR;Short Term: Able to use daily as guideline for intensity in rehab;Long Term: Able to use THRR to govern intensity when exercising independently Short Term: Able to state/look up THRR;Short Term: Able to use daily as guideline for intensity in rehab;Long Term: Able to use THRR to govern intensity when exercising independently     Able to check pulse independently Yes Yes Yes     Intervention Provide education and demonstration on how to check pulse in carotid and radial arteries.;Review the importance of being able to check your own pulse for safety during independent exercise Provide education and demonstration on how to check pulse in carotid and radial arteries.;Review the importance of being able to check your own pulse for safety during independent exercise Provide education and demonstration on how to check pulse in carotid and radial arteries.;Review the importance of being able to check your own pulse for safety during independent exercise     Expected Outcomes Short Term: Able to explain why pulse checking is important during independent exercise;Long Term: Able to check pulse independently and accurately Short Term: Able to explain why pulse checking is important during independent exercise;Long Term: Able to check pulse independently and accurately Short Term: Able to explain why pulse checking is important during independent exercise;Long Term: Able to check pulse independently and accurately     Understanding of Exercise Prescription Yes Yes Yes     Intervention Provide education, explanation, and written materials on patient's individual exercise prescription Provide education, explanation, and written materials on patient's individual exercise  prescription Provide education, explanation, and written materials on patient's individual exercise prescription     Expected Outcomes Short Term: Able to explain program exercise prescription;Long Term: Able to explain home exercise prescription to exercise independently Short Term: Able to explain program exercise prescription;Long Term: Able to explain home exercise prescription to exercise independently Short Term: Able to explain program exercise prescription;Long Term: Able to explain home exercise prescription to exercise independently              Exercise Goals Re-Evaluation:  Exercise Goals Re-Evaluation     Row Name 03/28/22 1040 04/25/22 1252 05/24/22 1106         Exercise Goal Re-Evaluation   Exercise Goals Review Increase Physical Activity;Increase Strength and Stamina;Able to understand and use rate of perceived exertion (RPE) scale;Knowledge and understanding of Target Heart Rate Range (THRR);Able to check pulse independently;Understanding of Exercise Prescription Increase Strength and Stamina;Increase Physical Activity;Able to understand and use rate of perceived exertion (RPE) scale;Knowledge and understanding of Target Heart Rate Range (THRR);Able to check pulse independently;Understanding of Exercise Prescription --     Comments Pt has completed 10 sessions of cardiac rehab. He is increasing his workload on the stepper. He was recently moves from the treadmill to do the stepper for both sessions due to having increased hip pain when walking for long period of time. He is currently exercising at 2.09 METs on the stepper. Will continue to monitor and progress as able. Pt has completed 20 session of cardiac rehab. He continues to use the stepper for both sessions due to hip pain when walking on the treadmill. He is increasing his level on the stepper. He is walking at home around his yard and driveway. He is curretnly exercising at 2.09 METs on the stepper.  WIll continue to monitor  and progress as able. --     Expected Outcomes Through exercise at rehab and home, the patient will meet their stated goals. Through exercise at rehab and home, the patient will meet their stated goals. Through exercise at rehab and home, the patient will meet their stated goals.              Nutrition & Weight - Outcomes:  Pre Biometrics - 03/04/22 1358       Pre Biometrics   Height '5\' 7"'$  (1.702 m)    Weight 213 lb 3 oz (96.7 kg)    Waist Circumference 47 inches    Hip Circumference 42 inches    Waist to Hip Ratio 1.12 %    BMI (Calculated) 33.38    Triceps Skinfold 18 mm    % Body Fat 34.1 %    Grip Strength 38.36 kg    Flexibility 0 in    Single Leg Stand 0 seconds             Post Biometrics - 05/27/22 1040        Post  Biometrics   Height '5\' 7"'$  (1.702 m)    Weight 209 lb 14.1 oz (95.2 kg)    Waist Circumference 44 inches    Hip Circumference 42 inches    Waist to Hip Ratio 1.05 %    BMI (Calculated) 32.86    Triceps Skinfold 10 mm    % Body Fat 30.1 %    Grip Strength 36.8 kg    Flexibility 0 in    Single Leg Stand 0 seconds             Nutrition:  Nutrition Therapy & Goals - 03/04/22 1339       Personal Nutrition Goals   Comments Patient scored 88 on his diet assessment. Handout provided and explained regarding healthier choices. He and his wife verbalized understanding. They are interested in meeting with a dietician. A referral will be made. We offer 2 educational sessions regarding heart healthy nutrition with handouts.      Intervention Plan   Intervention Nutrition handout(s) given to patient.    Expected Outcomes Short Term Goal: Understand basic principles of dietary content, such as calories, fat, sodium, cholesterol and nutrients.             Nutrition Discharge:  Nutrition Assessments - 05/31/22 0925       MEDFICTS Scores   Pre Score 88    Post Score 24    Score Difference -64             Education Questionnaire  Score:  Knowledge Questionnaire Score - 05/31/22 0923       Knowledge Questionnaire Score   Pre Score 20/24    Post Score 20/24             Goals reviewed with patient; copy given to patient. Pt discharged from CR after 31 sessions. His walk test distance decreased by 16.7%, and his MET level at discharge was 2.16. He reports no specific plans to continue exercise post discharge, but the importance of continued exercise was stressed to him severa times by staff. He was provided with information regarding our maintenance program.

## 2022-06-02 DIAGNOSIS — H40021 Open angle with borderline findings, high risk, right eye: Secondary | ICD-10-CM | POA: Diagnosis not present

## 2022-06-02 DIAGNOSIS — H43811 Vitreous degeneration, right eye: Secondary | ICD-10-CM | POA: Diagnosis not present

## 2022-06-02 DIAGNOSIS — H401121 Primary open-angle glaucoma, left eye, mild stage: Secondary | ICD-10-CM | POA: Diagnosis not present

## 2022-06-02 DIAGNOSIS — H04223 Epiphora due to insufficient drainage, bilateral lacrimal glands: Secondary | ICD-10-CM | POA: Diagnosis not present

## 2022-06-06 ENCOUNTER — Ambulatory Visit: Payer: PPO | Attending: Cardiovascular Disease | Admitting: Cardiovascular Disease

## 2022-06-06 ENCOUNTER — Encounter: Payer: Self-pay | Admitting: Cardiovascular Disease

## 2022-06-06 VITALS — BP 112/66 | HR 58 | Ht 67.0 in | Wt 207.0 lb

## 2022-06-06 DIAGNOSIS — E785 Hyperlipidemia, unspecified: Secondary | ICD-10-CM

## 2022-06-06 DIAGNOSIS — I10 Essential (primary) hypertension: Secondary | ICD-10-CM

## 2022-06-06 DIAGNOSIS — I2510 Atherosclerotic heart disease of native coronary artery without angina pectoris: Secondary | ICD-10-CM | POA: Diagnosis not present

## 2022-06-06 DIAGNOSIS — Z9889 Other specified postprocedural states: Secondary | ICD-10-CM | POA: Diagnosis not present

## 2022-06-06 NOTE — Patient Instructions (Signed)
Medication Instructions:  Your physician recommends that you continue on your current medications as directed. Please refer to the Current Medication list given to you today.  *If you need a refill on your cardiac medications before your next appointment, please call your pharmacy*  Lab Work: If you have labs (blood work) drawn today and your tests are completely normal, you will receive your results only by: MyChart Message (if you have MyChart) OR A paper copy in the mail If you have any lab test that is abnormal or we need to change your treatment, we will call you to review the results.  Testing/Procedures: None ordered today.  Follow-Up: At [facility], you and your health needs are our priority.  As part of our continuing mission to provide you with exceptional heart care, we have created designated Provider Care Teams.  These Care Teams include your primary Cardiologist (physician) and Advanced Practice Providers (APPs -  Physician Assistants and Nurse Practitioners) who all work together to provide you with the care you need, when you need it.  We recommend signing up for the patient portal called "MyChart".  Sign up information is provided on this After Visit Summary.  MyChart is used to connect with patients for Virtual Visits (Telemedicine).  Patients are able to view lab/test results, encounter notes, upcoming appointments, etc.  Non-urgent messages can be sent to your provider as well.   To learn more about what you can do with MyChart, go to [email or web address].    Your next appointment:   6 month(s)  Provider:   Peter Nishan, MD      

## 2022-06-14 ENCOUNTER — Encounter: Payer: Self-pay | Admitting: Internal Medicine

## 2022-06-14 ENCOUNTER — Ambulatory Visit (INDEPENDENT_AMBULATORY_CARE_PROVIDER_SITE_OTHER): Payer: PPO | Admitting: Internal Medicine

## 2022-06-14 VITALS — BP 104/70 | HR 56 | Ht 68.0 in | Wt 205.0 lb

## 2022-06-14 DIAGNOSIS — L309 Dermatitis, unspecified: Secondary | ICD-10-CM

## 2022-06-14 DIAGNOSIS — K602 Anal fissure, unspecified: Secondary | ICD-10-CM

## 2022-06-14 MED ORDER — DILTIAZEM GEL 2 %: 1.0000 | Freq: Two times a day (BID) | CUTANEOUS | 2 refills | Status: DC

## 2022-06-14 NOTE — Patient Instructions (Signed)
_______________________________________________________  If your blood pressure at your visit was 140/90 or greater, please contact your primary care physician to follow up on this.  _______________________________________________________  If you are age 77 or older, your body mass index should be between 23-30. Your Body mass index is 31.17 kg/m. If this is out of the aforementioned range listed, please consider follow up with your Primary Care Provider.  If you are age 19 or younger, your body mass index should be between 19-25. Your Body mass index is 31.17 kg/m. If this is out of the aformentioned range listed, please consider follow up with your Primary Care Provider.   ________________________________________________________  The [facility] providers would like to encourage you to use [facility] to communicate with providers for non-urgent requests or questions.  Due to long hold times on the telephone, sending your provider a message by [facility] may be a faster and more efficient way to get a response.  Please allow 48 business hours for a response.  Please remember that this is for non-urgent requests.  _______________________________________________________  We have sent a prescription for Diltiazem gel to [facility]. You should apply a pea size amount to your rectum five times daily x 6-8 weeks.  [facility]'s information is below: Address: [address], [place], [place] [address]  [phone]  *Please DO NOT go directly from our office to pick up this medication! Give the pharmacy 1 day to process the prescription as this is compounded and takes time to make.  Take 2 tablespoons of Citrucel in water or juice daily

## 2022-06-14 NOTE — Progress Notes (Signed)
HISTORY OF PRESENT ILLNESS:  Jeremy [name] is a 77 y.o. male with multiple medical problems as listed below.  Significant cardiac history as well as recurrent CVA on Plavix.  Patient was last seen in this office September 2020 regarding possible melena.  See that dictation for details.  He subsequently underwent colonoscopy and upper endoscopy October 2020.  Both examinations were normal.  Patient presents today with his wife complaining of rectal pain with defecation over the past 3 to 4 weeks.  Burning discomfort.  He reports a history of fissure.  He feels this is the same.  He is also had some fecal leakage and irritation of the perianal skin.  His bowels tend to be constipated.  No other complaints  REVIEW OF SYSTEMS:  All non-GI ROS negative unless otherwise stated in the HPI except for sinus allergy trouble, anxiety, arthritis, back pain, visual change, fatigue  Past Medical History:  Diagnosis Date   Anxiety    Arthritis    Basal cell carcinoma 02/11/2021   nod- left dorsal hand (EXC)   CAD (coronary artery disease)    a. s/p prior stenting of LAD and RCA b. 03/2019: NSTEMI and required PTCA/DESx1 to the LAD and successful scoring balloon angioplasty in the previously stented segment of LCx   Carotid stenosis, asymptomatic, right    Carpal tunnel syndrome    bilateral   Coronary artery disease    GERD (gastroesophageal reflux disease)    History of kidney stones    Hx of colonic polyp    Hypercholesterolemia    Hypertension    Myocardial infarction ([place])    hx of   Obesity    SCC (squamous cell carcinoma) 02/11/2021   in situ- right temple (CX35FU)   Squamous cell carcinoma of skin 08/09/2016   well differentiated on right outer eye - tx p bx   Stroke [facility])     Past Surgical History:  Procedure Laterality Date   CARDIAC CATHETERIZATION     CATARACT EXTRACTION W/PHACO Left 03/12/2021   Procedure: CATARACT EXTRACTION PHACO AND INTRAOCULAR LENS PLACEMENT ([place]);   Surgeon: Baruch Goldmann, MD;  Location: [facility];  Service: Ophthalmology;  Laterality: Left;   CDE: 14.58   CATARACT EXTRACTION W/PHACO Right 04/08/2021   Procedure: CATARACT EXTRACTION PHACO AND INTRAOCULAR LENS PLACEMENT RIGHT EYE;  Surgeon: Baruch Goldmann, MD;  Location: [facility];  Service: Ophthalmology;  Laterality: Right;  CDE 10.94   CORONARY BALLOON ANGIOPLASTY N/A 03/26/2019   Procedure: CORONARY BALLOON ANGIOPLASTY;  Surgeon: Burnell Blanks, MD;  Location: [place] [facility];  Service: Cardiovascular;  Laterality: N/A;   CORONARY STENT INTERVENTION N/A 03/26/2019   Procedure: CORONARY STENT INTERVENTION;  Surgeon: Burnell Blanks, MD;  Location: [place] [facility];  Service: Cardiovascular;  Laterality: N/A;   CORONARY STENT INTERVENTION N/A 11/15/2021   Procedure: CORONARY STENT INTERVENTION;  Surgeon: Lorretta Harp, MD;  Location: [place] [facility];  Service: Cardiovascular;  Laterality: N/A;   CORONARY STENT PLACEMENT  2000   x3    CYST EXCISION     x 2   ENDARTERECTOMY Right 12/25/2017   Procedure: ENDARTERECTOMY CAROTID RIGHT;  Surgeon: Rosetta Posner, MD;  Location: [place];  Service: Vascular;  Laterality: Right;   LEFT HEART CATH AND CORONARY ANGIOGRAPHY N/A 03/26/2019   Procedure: LEFT HEART CATH AND CORONARY ANGIOGRAPHY;  Surgeon: Burnell Blanks, MD;  Location: [place] [facility];  Service: Cardiovascular;  Laterality: N/A;   LEFT HEART CATH AND CORONARY  ANGIOGRAPHY N/A 11/15/2021   Procedure: LEFT HEART CATH AND CORONARY ANGIOGRAPHY;  Surgeon: Lorretta Harp, MD;  Location: [place] [facility];  Service: Cardiovascular;  Laterality: N/A;   PATCH ANGIOPLASTY Right 12/25/2017   Procedure: PATCH ANGIOPLASTY RIGHT CAROTID ARTERY;  Surgeon: Rosetta Posner, MD;  [facility];  Service: Vascular;  Laterality: Right;   TEAR DUCT PROBING Bilateral 07/2020   done at [place]. Dr. Norlene Duel   TEE WITHOUT CARDIOVERSION N/A 06/29/2018   Procedure:  TRANSESOPHAGEAL ECHOCARDIOGRAM (TEE);  Surgeon: Fay Records, MD;  Location: [facility];  Service: Cardiovascular;  Laterality: N/A;    Social History SAED HUDLOW  reports that he quit smoking about 34 years ago. His smoking use included cigarettes. He has a 40.00 pack-year smoking history. He has never used smokeless tobacco. He reports that he does not drink alcohol and does not use drugs.  family history includes Coronary artery disease in his brother; Coronary artery disease (age of onset: 54) in his mother; Hypertension in his mother; Other (age of onset: 54) in his father; Other (age of onset: 22) in his sister; Skin cancer in his brother.  Allergies  Allergen Reactions   Codeine Anaphylaxis   Oxycontin [Oxycodone Hcl] Itching    Through IV- has since had oral oxycodone without issue   Contrast Media [Iodinated Contrast Media]     Patient got very nauseated. Gave '4mg'$  Zofran. Possibly pre medicate with Zofran    Morphine And Related Itching       PHYSICAL EXAMINATION: Vital signs: BP 104/70   Pulse (!) 56   Ht '5\' 8"'$  (1.727 m)   Wt 205 lb (93 kg)   BMI 31.17 kg/m   Constitutional: generally well-appearing, no acute distress Psychiatric: alert and oriented x3, cooperative Eyes: extraocular movements intact, anicteric, conjunctiva pink Mouth: oral pharynx moist, no lesions Neck: supple no lymphadenopathy Cardiovascular: heart regular rate and rhythm, no murmur Lungs: clear to auscultation bilaterally Abdomen: soft, nontender, nondistended, no obvious ascites, no peritoneal signs, normal bowel sounds, no organomegaly Rectal: Irritation of perirectal skin without obvious infection or other abnormality.  Anterior anal fissure.  Tender. Extremities: no clubbing, cyanosis, or lower extremity edema bilaterally Skin: no lesions on visible extremities Neuro: No focal deficits.  Cranial nerves intact  ASSESSMENT:  1.  Anal fissure 2.  Perianal dermatitis 3.   Constipation   PLAN:  1.  Prescribe 2% diltiazem ointment.  Apply 5 times daily.  Personally instructed on the proper way to apply the medication 2.  Desitin cream on the irritated perianal skin 3.  Citrucel 2 tablespoons daily to improve bowel habits 4.  Sitz bath's 5.  Follow-up as needed A total time of 45 minutes was spent preparing to see the patient, obtaining comprehensive history, performing medically appropriate physical examination, counseling and educating patient and his wife regarding the above listed issues, ordering medication, directing ancillary therapies, and documenting clinical information in the health record

## 2022-07-04 DIAGNOSIS — G629 Polyneuropathy, unspecified: Secondary | ICD-10-CM | POA: Insufficient documentation

## 2022-07-04 NOTE — Progress Notes (Unsigned)
[facility] [address] [place]. [place] [address] [phone]       STROKE FOLLOW UP NOTE  [name] Date of Birth:  [birth year] Medical Record Number:  [id number]   Reason for visit: Stroke follow-up    SUBJECTIVE:   CHIEF COMPLAINT:  No chief complaint on file.   HPI:   Update 07/05/2021 JM: Patient returns for 77-monthfollow-up accompanied by his wife.  Stable since prior visit, denies new stroke/TIA symptoms.  Reports residual ***.  Peripheral visual loss and word mix up ***.  Compliant on aspirin, Plavix and Crestor.  Blood pressure well-controlled.  Routinely follows with PCP and cardiology.  Denies any seizure-like activity.  Remains on Depakote DR 5030mBID, denies side effects. Mood stable.           History provided for reference purposes only Initial visit 01/06/2022 JM: Patient is being seen for initial hospital follow-up accompanied by his wife.  Reports doing well since discharge. Reports he was seen by ophthalmologist since discharge, was told he has some peripheral visual loss but is not noticeable by patient.  He has since returned back to all prior activities as well as driving without difficulty.  Wife does mention that he will occasionally mix up words or say the wrong word which has been present since his recent stroke.  This is fairly unchanged since discharge.  Denies any new stroke/TIA symptoms. He does note increased fatigue since discharge.  Does admit to significant snoring and per wife, ICU nurses reported witnessed apneas.  Reports she sleeps well throughout the night without any difficulty.  He has not previously underwent sleep study.  Remains on Depakote DR 500 mg twice daily, denies side effects. He has not had any issues with outbursts or agitation.  No seizure activity.  Recent Depakote level 39. Repeat ammonia level now WNL.   Remains on both aspirin and Plavix and Crestor, denies side effects Blood pressure  today 159/66 - monitors at home and typically 120-130s  He has since had follow-up with cardiology and PCP  No further concerns at this time   Stroke admission 11/13/2021 [name] a [age].o. male with history of right CEA 2019, CAD, hypertension, hyperlipidemia, history of seizure and stroke 05/2018 who was initially admitted on 11/13/2021 for intermittent chest pain and subsequently underwent cardiac cath and stenting.  Post cardiac cath on 6/26 patient was found to have confusion, difficulty speaking, somnolence and mild left arm drift.  Neurology was consulted with MRI showing right PCA, cerebellar and punctate frontal parietal infarcts embolic likely related to cardiac cath procedure below unable to completely rule out A-fib although not seen on telemetry, Dr. CoBurt [name] not feel monitor warranted at this time.  Carotid Doppler unremarkable.  EF 60 to 65%.  EEG moderate diffuse encephalopathy.  LDL 59.  A1c 5.4.  On aspirin and Plavix per cardiology recommendations as well as Crestor 40 mg daily.  Prior history of seizure in 05/2018 but discontinued AEDs d/t cognitive side effects including Dilantin, Keppra and Vimpat at prior visit with Dr. SeLeonie [name] 07/2018.  Neurology recommended starting Depakote for mood stabilization (combativeness and agitation during admission) and prior history of seizure.  Also noted low B12 levels at 192 and elevated ammonia at 41.  Recommended repeat labs at follow-up visits.  Residual deficits of left upper quadrantanopsia and advised no driving until neurology f/u. He was discharged home on 6/29.      PERTINENT IMAGING  Per hospitalization 11/15/2021 CT head no acute finding CT head and neck limited, nondiagnostic MRI right PCA, right cerebellum and punctate right frontal parietal infarcts Carotid Doppler unremarkable 2D Echo EF 60 to 65% EEG moderate diffuse encephalopathy LDL 59 HgbA1c 5.4    ROS:   14 system review of systems performed and  negative with exception of those listed in HP  PMH:  Past Medical History:  Diagnosis Date   Anxiety    Arthritis    Basal cell carcinoma 02/11/2021   nod- left dorsal hand (EXC)   CAD (coronary artery disease)    a. s/p prior stenting of LAD and RCA b. 03/2019: NSTEMI and required PTCA/DESx1 to the LAD and successful scoring balloon angioplasty in the previously stented segment of LCx   Carotid stenosis, asymptomatic, right    Carpal tunnel syndrome    bilateral   Coronary artery disease    GERD (gastroesophageal reflux disease)    History of kidney stones    Hx of colonic polyp    Hypercholesterolemia    Hypertension    Myocardial infarction ([place])    hx of   Obesity    SCC (squamous cell carcinoma) 02/11/2021   in situ- right temple (CX35FU)   Squamous cell carcinoma of skin 08/09/2016   well differentiated on right outer eye - tx p bx   Stroke ([place])     PSH:  Past Surgical History:  Procedure Laterality Date   CARDIAC CATHETERIZATION     CATARACT EXTRACTION W/PHACO Left 03/12/2021   Procedure: CATARACT EXTRACTION PHACO AND INTRAOCULAR LENS PLACEMENT ([place]);  Surgeon: Baruch Goldmann, MD;  Location: [facility];  Service: Ophthalmology;  Laterality: Left;   CDE: 14.58   CATARACT EXTRACTION W/PHACO Right 04/08/2021   Procedure: CATARACT EXTRACTION PHACO AND INTRAOCULAR LENS PLACEMENT RIGHT EYE;  Surgeon: Baruch Goldmann, MD;  Location: [facility];  Service: Ophthalmology;  Laterality: Right;  CDE 10.94   CORONARY BALLOON ANGIOPLASTY N/A 03/26/2019   Procedure: CORONARY BALLOON ANGIOPLASTY;  Surgeon: Burnell Blanks, MD;  Location: [place] [facility];  Service: Cardiovascular;  Laterality: N/A;   CORONARY STENT INTERVENTION N/A 03/26/2019   Procedure: CORONARY STENT INTERVENTION;  Surgeon: Burnell Blanks, MD;  Location: [place] [facility];  Service: Cardiovascular;  Laterality: N/A;   CORONARY STENT INTERVENTION N/A 11/15/2021   Procedure: CORONARY STENT INTERVENTION;   Surgeon: Lorretta Harp, MD;  Location: [place] [facility];  Service: Cardiovascular;  Laterality: N/A;   CORONARY STENT PLACEMENT  2000   x3    CYST EXCISION     x 2   ENDARTERECTOMY Right 12/25/2017   Procedure: ENDARTERECTOMY CAROTID RIGHT;  Surgeon: Rosetta Posner, MD;  Location: [place];  Service: Vascular;  Laterality: Right;   LEFT HEART CATH AND CORONARY ANGIOGRAPHY N/A 03/26/2019   Procedure: LEFT HEART CATH AND CORONARY ANGIOGRAPHY;  Surgeon: Burnell Blanks, MD;  Location: [place] [facility];  Service: Cardiovascular;  Laterality: N/A;   LEFT HEART CATH AND CORONARY ANGIOGRAPHY N/A 11/15/2021   Procedure: LEFT HEART CATH AND CORONARY ANGIOGRAPHY;  Surgeon: Lorretta Harp, MD;  Location: [place] [facility];  Service: Cardiovascular;  Laterality: N/A;   PATCH ANGIOPLASTY Right 12/25/2017   Procedure: PATCH ANGIOPLASTY RIGHT CAROTID ARTERY;  Surgeon: Rosetta Posner, MD;  [facility];  Service: Vascular;  Laterality: Right;   TEAR DUCT PROBING Bilateral 07/2020   done at [place]. Dr. Norlene Duel   TEE WITHOUT CARDIOVERSION N/A 06/29/2018   Procedure: TRANSESOPHAGEAL  ECHOCARDIOGRAM (TEE);  Surgeon: Fay Records, MD;  Location: [facility] ENDOSCOPY;  Service: Cardiovascular;  Laterality: N/A;    Social History:  Social History   Socioeconomic History   Marital status: Married    Spouse name: Not on file   Number of children: 2   Years of education: Not on file   Highest education level: Not on file  Occupational History   Occupation: retired    Comment: welder  Tobacco Use   Smoking status: Former    Packs/day: 2.00    Years: 20.00    Total pack years: 40.00    Types: Cigarettes    Quit date: 05/23/1988    Years since quitting: 34.1   Smokeless tobacco: Never   Tobacco comments:    smoked 2 packs per day for 20 years  Vaping Use   Vaping Use: Never used  Substance and Sexual Activity   Alcohol use: No   Drug use: No   Sexual activity: Yes  Other Topics Concern    Not on file  Social History Narrative   Not on file   Social Determinants of Health   Financial Resource Strain: Not on file  Food Insecurity: Not on file  Transportation Needs: Not on file  Physical Activity: Not on file  Stress: Not on file  Social Connections: Not on file  Intimate Partner Violence: Not on file    Family History:  Family History  Problem Relation Age of Onset   Coronary artery disease Mother 27       deceased   Hypertension Mother    Other Father 63       deceased old age   Other Sister 33       living and healthy   Coronary artery disease Brother    Skin cancer Brother    Colon cancer Neg Hx    Pancreatic cancer Neg Hx    Stomach cancer Neg Hx    Esophageal cancer Neg Hx    Liver disease Neg Hx     Medications:   Current Outpatient Medications on File Prior to Visit  Medication Sig Dispense Refill   acetaminophen (TYLENOL) 500 MG tablet Take 500 mg by mouth every 6 (six) hours as needed for mild pain.     aspirin EC 81 MG EC tablet Take 1 tablet (81 mg total) by mouth daily. 90 tablet 3   carvedilol (COREG) 12.5 MG tablet Take 1 tablet (12.5 mg total) by mouth 2 (two) times daily with a meal. 180 tablet 3   clopidogrel (PLAVIX) 75 MG tablet Take 1 tablet by mouth once daily (Patient taking differently: Take 75 mg by mouth every evening.) 90 tablet 3   diltiazem 2 % GEL Apply 1 Application topically 2 (two) times daily. 30 g 2   divalproex (DEPAKOTE) 500 MG DR tablet Take 1 tablet (500 mg total) by mouth every 12 (twelve) hours. 180 tablet 3   gabapentin (NEURONTIN) 300 MG capsule Take 1 capsule (300 mg total) by mouth 3 (three) times daily. 90 capsule 6   isosorbide mononitrate (IMDUR) 30 MG 24 hr tablet Take 1 tablet by mouth once daily (Patient taking differently: Take 30 mg by mouth daily after breakfast.) 90 tablet 1   losartan (COZAAR) 50 MG tablet Take 1 tablet (50 mg total) by mouth daily. 90 tablet 3   nitroGLYCERIN (NITROSTAT) 0.4 MG SL  tablet Place 1 tablet (0.4 mg total) under the tongue every 5 (five) minutes as needed for chest pain.  25 tablet 3   nystatin (MYCOSTATIN/NYSTOP) powder Apply 1 Application topically 2 (two) times daily as needed (skin irritation/rash.).     nystatin cream (MYCOSTATIN) Apply 1 Application topically 2 (two) times daily as needed (severe irritation/rash).     oxyCODONE (ROXICODONE) 15 MG immediate release tablet Take 15 mg by mouth every 6 (six) hours as needed for pain.     pantoprazole (PROTONIX) 40 MG tablet Take 1 tablet (40 mg total) by mouth daily. 90 tablet 3   RESTASIS MULTIDOSE 0.05 % ophthalmic emulsion Place 1 drop into both eyes 2 (two) times daily as needed (dry/irritated eyes.).     rosuvastatin (CRESTOR) 40 MG tablet Take 40 mg by mouth at bedtime.     XIIDRA 5 % SOLN Place 1 drop into both eyes daily as needed (dry eye).     No current facility-administered medications on file prior to visit.    Allergies:   Allergies  Allergen Reactions   Codeine Anaphylaxis   Oxycontin [Oxycodone Hcl] Itching    Through IV- has since had oral oxycodone without issue   Contrast Media [Iodinated Contrast Media]     Patient got very nauseated. Gave 4m Zofran. Possibly pre medicate with Zofran    Morphine And Related Itching      OBJECTIVE:  Physical Exam  There were no vitals filed for this visit.  There is no height or weight on file to calculate BMI. No results found.  Poststroke PHQ 2/9    05/31/2022    9:26 AM  Depression screen PHQ 2/9  Decreased Interest 1  Down, Depressed, Hopeless 0  PHQ - 2 Score 1  Altered sleeping 2  Tired, decreased energy 2  Change in appetite 1  Feeling bad or failure about yourself  0  Trouble concentrating 0  Moving slowly or fidgety/restless 1  Suicidal thoughts 0  PHQ-9 Score 7  Difficult doing work/chores Somewhat difficult     General: well developed, well nourished, pleasant elderly Caucasian male, seated, in no evident  distress Head: head normocephalic and atraumatic.   Neck: supple with no carotid or supraclavicular bruits Cardiovascular: regular rate and rhythm, no murmurs Musculoskeletal: no deformity Skin:  no rash/petichiae Vascular:  Normal pulses all extremities   Neurologic Exam Mental Status: Awake and fully alert.  Some speech hesitancy unable to appreciate aphasia or dysarthria.  Oriented to place and time. Recent and remote memory intact. Attention span, concentration and fund of knowledge appropriate. Mood and affect appropriate.  Cranial Nerves: Pupils equal, briskly reactive to light. Extraocular movements full without nystagmus. Visual fields full to confrontation (unable to appreciate any peripheral visual loss). Hearing intact. Facial sensation intact. Face, tongue, palate moves normally and symmetrically.  Motor: Normal bulk and tone. Normal strength in all tested extremity muscles Sensory.: intact to touch , pinprick , position and vibratory sensation.  Coordination: Rapid alternating movements normal in all extremities. Finger-to-nose and heel-to-shin performed accurately bilaterally. Gait and Station: Arises from chair without difficulty. Stance is normal. Gait demonstrates normal stride length and balance without use of AD. Reflexes: 1+ and symmetric. Toes downgoing.         ASSESSMENT: Jeremy KUTCHERis a 77y.o. year old male with right PCA, cerebellar and punctate frontoparietal infarcts on 11/15/2021 likely related to cardiac cath procedure. Vascular risk factors include history of right CEA, history of seizure and stroke 2020, HTN, HLD, B12 deficiency, CAD, CHF and advanced age.      PLAN:  Recent stroke:  Hx  of prior strokes Residual deficit: Occasional word mixup - discussed evaluation with SLP but declines interest at this time.  He was advised to call office if he wishes to proceed in the future Continue aspirin 81 mg daily and clopidogrel 75 mg daily  and Crestor per  cardiology recommendations and for secondary stroke prevention.   Discussed secondary stroke prevention measures and importance of close PCP follow up for aggressive stroke risk factor management including BP goal<130/90, HLD with LDL goal<70 and DM with A1c.<7 .  Stroke labs 10/2021: LDL 59, A1c 5.4 I have gone over the pathophysiology of stroke, warning signs and symptoms, risk factors and their management in some detail with instructions to go to the closest emergency room for symptoms of concern.  Hx of seizure: Mood stabilization: No recent seizure activity Does remain at increased risk of seizure activity in setting of prior strokes Continue Depakote 500 mg twice daily.  Refill provided Valproic acid level 39 (11/2021) Hx of elevated ammonia now normal  Suspected sleep apnea Daytime fatigue, snoring and witnessed apneas Discussed possible underlying sleep apnea and untreated sleep apnea increases risk of additional strokes and further heart disease He declines interest in further evaluation at this time but advised to call office if interested in pursuing in the future or can further discuss with cardiology    Follow up in 6 months or call earlier if needed   CC:  PCP: Celene Squibb, MD    I spent 59 minutes of face-to-face and non-face-to-face time with patient and wife.  This included previsit chart review, lab review, study review, order entry, electronic health record documentation, patient and wife education and discussion regarding above diagnoses and treatment plan and answered all the questions to patient and wife satisfaction   Frann Rider, AGNP-BC  [facility] [address] [place], [place] [address]  Phone [phone] Fax [phone] Note: This document was prepared with digital dictation and possible smart phrase technology. Any transcriptional errors that result from this process are unintentional.

## 2022-07-05 ENCOUNTER — Ambulatory Visit: Payer: PPO | Admitting: Adult Health

## 2022-07-05 ENCOUNTER — Encounter: Payer: Self-pay | Admitting: Adult Health

## 2022-07-05 VITALS — BP 103/61 | HR 54 | Ht 66.0 in | Wt 200.0 lb

## 2022-07-05 DIAGNOSIS — Z8673 Personal history of transient ischemic attack (TIA), and cerebral infarction without residual deficits: Secondary | ICD-10-CM | POA: Diagnosis not present

## 2022-07-05 DIAGNOSIS — R569 Unspecified convulsions: Secondary | ICD-10-CM | POA: Diagnosis not present

## 2022-07-05 DIAGNOSIS — R29818 Other symptoms and signs involving the nervous system: Secondary | ICD-10-CM

## 2022-07-05 DIAGNOSIS — E538 Deficiency of other specified B group vitamins: Secondary | ICD-10-CM

## 2022-07-05 DIAGNOSIS — G4719 Other hypersomnia: Secondary | ICD-10-CM

## 2022-07-05 DIAGNOSIS — Z5181 Encounter for therapeutic drug level monitoring: Secondary | ICD-10-CM

## 2022-07-05 DIAGNOSIS — I639 Cerebral infarction, unspecified: Secondary | ICD-10-CM

## 2022-07-05 DIAGNOSIS — R251 Tremor, unspecified: Secondary | ICD-10-CM | POA: Diagnosis not present

## 2022-07-05 MED ORDER — DIVALPROEX SODIUM ER 500 MG PO TB24: 500.0000 mg | ORAL_TABLET | Freq: Every day | ORAL | 5 refills | Status: DC

## 2022-07-05 NOTE — Patient Instructions (Addendum)
We will check B12 and thyroid levels today to see if these could be contributing to tremor and daytime fatigue   Referral will be placed to be evaluated for sleep apnea - you will be called to schedule initial evaluation   Decrease Depakote to ER 517m nightly to see if this helps with tremor and fatigue - please monitor for any seizure like activity   Continue aspirin 81 mg daily and clopidogrel 75 mg daily  and Crestor for secondary stroke prevention and per cardiology recommendations  Continue to follow up with PCP regarding blood pressure and cholesterol management  Maintain strict control of hypertension with blood pressure goal below 130/90 and cholesterol with LDL cholesterol (bad cholesterol) goal below 70 mg/dL.   Signs of a Stroke? Follow the BEFAST method:  Balance Watch for a sudden loss of balance, trouble with coordination or vertigo Eyes Is there a sudden loss of vision in one or both eyes? Or double vision?  Face: Ask the person to smile. Does one side of the face droop or is it numb?  Arms: Ask the person to raise both arms. Does one arm drift downward? Is there weakness or numbness of a leg? Speech: Ask the person to repeat a simple phrase. Does the speech sound slurred/strange? Is the person confused ? Time: If you observe any of these signs, call 911.     Followup in the future with me in 5 months or call earlier if needed     [facility]. I hope we have been able to provide you high quality care today.  You may receive a patient satisfaction survey over the next few weeks. We would appreciate your feedback and comments so that we may continue to improve ourselves and the health of our patients.

## 2022-07-06 LAB — VITAMIN B12: Vitamin B-12: 417 pg/mL (ref 232–1245)

## 2022-07-06 LAB — TSH: TSH: 3.92 u[IU]/mL (ref 0.450–4.500)

## 2022-07-06 LAB — VALPROIC ACID LEVEL: Valproic Acid Lvl: 72 ug/mL (ref 50–100)

## 2022-07-07 DIAGNOSIS — G8929 Other chronic pain: Secondary | ICD-10-CM | POA: Diagnosis not present

## 2022-07-07 DIAGNOSIS — I639 Cerebral infarction, unspecified: Secondary | ICD-10-CM | POA: Diagnosis not present

## 2022-07-07 DIAGNOSIS — Z0000 Encounter for general adult medical examination without abnormal findings: Secondary | ICD-10-CM | POA: Diagnosis not present

## 2022-07-07 DIAGNOSIS — G629 Polyneuropathy, unspecified: Secondary | ICD-10-CM | POA: Diagnosis not present

## 2022-07-07 DIAGNOSIS — E669 Obesity, unspecified: Secondary | ICD-10-CM | POA: Diagnosis not present

## 2022-07-07 DIAGNOSIS — Z87891 Personal history of nicotine dependence: Secondary | ICD-10-CM | POA: Diagnosis not present

## 2022-07-07 DIAGNOSIS — E782 Mixed hyperlipidemia: Secondary | ICD-10-CM | POA: Diagnosis not present

## 2022-07-07 DIAGNOSIS — Z6834 Body mass index (BMI) 34.0-34.9, adult: Secondary | ICD-10-CM | POA: Diagnosis not present

## 2022-07-07 DIAGNOSIS — N1831 Chronic kidney disease, stage 3a: Secondary | ICD-10-CM | POA: Diagnosis not present

## 2022-07-07 DIAGNOSIS — I129 Hypertensive chronic kidney disease with stage 1 through stage 4 chronic kidney disease, or unspecified chronic kidney disease: Secondary | ICD-10-CM | POA: Diagnosis not present

## 2022-07-07 DIAGNOSIS — I2510 Atherosclerotic heart disease of native coronary artery without angina pectoris: Secondary | ICD-10-CM | POA: Diagnosis not present

## 2022-07-07 DIAGNOSIS — Z125 Encounter for screening for malignant neoplasm of prostate: Secondary | ICD-10-CM | POA: Diagnosis not present

## 2022-07-30 ENCOUNTER — Other Ambulatory Visit: Payer: Self-pay | Admitting: Cardiovascular Disease

## 2022-08-01 ENCOUNTER — Telehealth: Payer: Self-pay | Admitting: Adult Health

## 2022-08-01 ENCOUNTER — Institutional Professional Consult (permissible substitution): Payer: PPO | Admitting: Neurology

## 2022-08-01 NOTE — Telephone Encounter (Signed)
Pt's wife called to cancel appt due to pt decided did not want to go through with sleep consult.

## 2022-10-20 NOTE — Progress Notes (Signed)
Cardiology Office Note    Date:  10/20/2022   ID:  Jeremy [name], DOB August 30, 1945, MRN [id number]  PCP:  Benita Stabile, MD  Cardiologist: Charlton Haws, MD     History of Present Illness:    77 y.o. previously seen by Dr Henrietta Hoover. History of CAD with stenting of the CircumlfexLAD. CRF;s HTN, HLD. PVD with history CVA and right CEA   NSTEMI 03/25/19 peak troponin 5345 ICLBBB on ECG  Cath by Dr. Clifton James on 03/26/2019 and showed 50% prox-RCA stenosis, 60% dRCA, 50% RPDA, 90% prox LCx, 99% Prox-LAD, 70% Distal LCx, 60% 2nd Mrg, 80% 2nd Diagonal and preserved EF of 50-55%. Was treated with PTCA/DESx1 to the LAD and successful scoring balloon angioplasty in the previously stented segment of LCx.   Life long DAT with stents , restenosis and CVA   Admitted 09/18/20 with chest pain diaphoresis r/o TTE normal EF no RWMAls And no acute ECG changes Cr elevated 1.9 from baseline 1.6 ACE d/c and imdur started   Admitted 11/14/21 with angina old LBBB and negative troponins .  CAth with patent stents to proximal LAD and LCX Had 80% mid RCA lesion with new stent. Has residual small vessel disease in OM and diagonals EF preserved by TTE 60-65% 11/14/21 Carotids with plaque no stenosis Had ? Embolic stroke post cath with aphasia, confusion and combativeness Was reloaded with Depakote MRI with right PCA stroke   Long discussion about need for weight loss He finished cardiac rehab 05/27/22   Seems to have some depression or cognitive slowing from stroke Wife complains that he has fatigue and can sleep for days TSH is normal discussed updating echo for persistent dyspnea and fatigue   He also complains of more angina no rest pain taking nitro 102/month Usually with yard work. Discussed ongoing medical Rx Hesitant to repeat cath with prior stroke during procedure   Past Medical History:  Diagnosis Date   Anxiety    Arthritis    Basal cell carcinoma 02/11/2021   nod- left dorsal hand (EXC)   CAD (coronary  artery disease)    a. s/p prior stenting of LAD and RCA b. 03/2019: NSTEMI and required PTCA/DESx1 to the LAD and successful scoring balloon angioplasty in the previously stented segment of LCx   Carotid stenosis, asymptomatic, right    Carpal tunnel syndrome    bilateral   Coronary artery disease    GERD (gastroesophageal reflux disease)    History of kidney stones    Hx of colonic polyp    Hypercholesterolemia    Hypertension    Myocardial infarction (HCC)    hx of   Obesity    SCC (squamous cell carcinoma) 02/11/2021   in situ- right temple ([id number])   Squamous cell carcinoma of skin 08/09/2016   well differentiated on right outer eye - tx p bx   Stroke [facility])     Past Surgical History:  Procedure Laterality Date   CARDIAC CATHETERIZATION     CATARACT EXTRACTION W/PHACO Left 03/12/2021   Procedure: CATARACT EXTRACTION PHACO AND INTRAOCULAR LENS PLACEMENT (IOC);  Surgeon: Fabio Pierce, MD;  Location: [facility];  Service: Ophthalmology;  Laterality: Left;   CDE: 14.58   CATARACT EXTRACTION W/PHACO Right 04/08/2021   Procedure: CATARACT EXTRACTION PHACO AND INTRAOCULAR LENS PLACEMENT RIGHT EYE;  Surgeon: Fabio Pierce, MD;  Location: [facility];  Service: Ophthalmology;  Laterality: Right;  CDE 10.94   CORONARY BALLOON ANGIOPLASTY N/A 03/26/2019   Procedure: CORONARY BALLOON ANGIOPLASTY;  Surgeon:  Kathleene Hazel, MD;  Location: [facility];  Service: Cardiovascular;  Laterality: N/A;   CORONARY STENT INTERVENTION N/A 03/26/2019   Procedure: CORONARY STENT INTERVENTION;  Surgeon: Kathleene Hazel, MD;  Location: [facility];  Service: Cardiovascular;  Laterality: N/A;   CORONARY STENT INTERVENTION N/A 11/15/2021   Procedure: CORONARY STENT INTERVENTION;  Surgeon: Runell Gess, MD;  Location: [facility];  Service: Cardiovascular;  Laterality: N/A;   CORONARY STENT PLACEMENT  2000   x3    CYST EXCISION     x 2   ENDARTERECTOMY Right 12/25/2017    Procedure: ENDARTERECTOMY CAROTID RIGHT;  Surgeon: Larina Earthly, MD;  Location: [facility];  Service: Vascular;  Laterality: Right;   LEFT HEART CATH AND CORONARY ANGIOGRAPHY N/A 03/26/2019   Procedure: LEFT HEART CATH AND CORONARY ANGIOGRAPHY;  Surgeon: Kathleene Hazel, MD;  Location: [facility];  Service: Cardiovascular;  Laterality: N/A;   LEFT HEART CATH AND CORONARY ANGIOGRAPHY N/A 11/15/2021   Procedure: LEFT HEART CATH AND CORONARY ANGIOGRAPHY;  Surgeon: Runell Gess, MD;  Location: [facility];  Service: Cardiovascular;  Laterality: N/A;   PATCH ANGIOPLASTY Right 12/25/2017   Procedure: PATCH ANGIOPLASTY RIGHT CAROTID ARTERY;  Surgeon: Larina Earthly, MD;  Location: [facility];  Service: Vascular;  Laterality: Right;   TEAR DUCT PROBING Bilateral 07/2020   done at [facility]. Dr. Mickie Kay   TEE WITHOUT CARDIOVERSION N/A 06/29/2018   Procedure: TRANSESOPHAGEAL ECHOCARDIOGRAM (TEE);  Surgeon: Pricilla Riffle, MD;  Location: [facility] ENDOSCOPY;  Service: Cardiovascular;  Laterality: N/A;    Current Medications: Outpatient Medications Prior to Visit  Medication Sig Dispense Refill   acetaminophen (TYLENOL) 500 MG tablet Take 500 mg by mouth every 6 (six) hours as needed for mild pain.     aspirin EC 81 MG EC tablet Take 1 tablet (81 mg total) by mouth daily. 90 tablet 3   carvedilol (COREG) 12.5 MG tablet Take 1 tablet (12.5 mg total) by mouth 2 (two) times daily with a meal. 180 tablet 3   clopidogrel (PLAVIX) 75 MG tablet Take 1 tablet by mouth once daily (Patient taking differently: Take 75 mg by mouth every evening.) 90 tablet 3   diltiazem 2 % GEL Apply 1 Application topically 2 (two) times daily. 30 g 2   divalproex (DEPAKOTE ER) 500 MG 24 hr tablet Take 1 tablet (500 mg total) by mouth at bedtime. 30 tablet 5   gabapentin (NEURONTIN) 300 MG capsule Take 1 capsule (300 mg total) by mouth 3 (three) times daily. 90 capsule 6   isosorbide mononitrate (IMDUR) 30 MG 24 hr tablet  Take 1 tablet by mouth once daily 90 tablet 3   losartan (COZAAR) 50 MG tablet Take 1 tablet (50 mg total) by mouth daily. 90 tablet 3   nitroGLYCERIN (NITROSTAT) 0.4 MG SL tablet Place 1 tablet (0.4 mg total) under the tongue every 5 (five) minutes as needed for chest pain. 25 tablet 3   nystatin (MYCOSTATIN/NYSTOP) powder Apply 1 Application topically 2 (two) times daily as needed (skin irritation/rash.).     nystatin cream (MYCOSTATIN) Apply 1 Application topically 2 (two) times daily as needed (severe irritation/rash).     oxyCODONE (ROXICODONE) 15 MG immediate release tablet Take 15 mg by mouth every 6 (six) hours as needed for pain.     pantoprazole (PROTONIX) 40 MG tablet Take 1 tablet (40 mg total) by mouth daily. 90 tablet 3   RESTASIS  MULTIDOSE 0.05 % ophthalmic emulsion Place 1 drop into both eyes 2 (two) times daily as needed (dry/irritated eyes.).     rosuvastatin (CRESTOR) 40 MG tablet Take 40 mg by mouth at bedtime.     XIIDRA 5 % SOLN Place 1 drop into both eyes daily as needed (dry eye).     No facility-administered medications prior to visit.     Allergies:   Codeine, Oxycontin [oxycodone hcl], Contrast media [iodinated contrast media], and Morphine and codeine   Social History   Socioeconomic History   Marital status: Married    Spouse name: Not on file   Number of children: 2   Years of education: Not on file   Highest education level: Not on file  Occupational History   Occupation: retired    Comment: welder  Tobacco Use   Smoking status: Former    Packs/day: 2.00    Years: 20.00    Additional pack years: 0.00    Total pack years: 40.00    Types: Cigarettes    Quit date: 05/23/1988    Years since quitting: 34.4   Smokeless tobacco: Never   Tobacco comments:    smoked 2 packs per day for 20 years  Vaping Use   Vaping Use: Never used  Substance and Sexual Activity   Alcohol use: No   Drug use: No   Sexual activity: Yes  Other Topics Concern   Not on file   Social History Narrative   Not on file   Social Determinants of Health   Financial Resource Strain: Not on file  Food Insecurity: Not on file  Transportation Needs: Not on file  Physical Activity: Not on file  Stress: Not on file  Social Connections: Not on file     Family History:  The patient's family history includes Coronary artery disease in his brother; Coronary artery disease (age of onset: 26) in his mother; Hypertension in his mother; Other (age of onset: 64) in his father; Other (age of onset: 4) in his sister; Skin cancer in his brother.   Review of Systems:   Please see the history of present illness.     General:  No chills, fever, night sweats or weight changes. Positive for bilateral shoulder pain (occurring for several months). Cardiovascular:  No chest pain, dyspnea on exertion, edema, orthopnea, palpitations, paroxysmal nocturnal dyspnea. Dermatological: No rash, lesions/masses Respiratory: No cough, dyspnea Urologic: No hematuria, dysuria Abdominal:   No nausea, vomiting, diarrhea, bright red blood per rectum, melena, or hematemesis Neurologic:  No visual changes, wkns, changes in mental status. All other systems reviewed and are otherwise negative except as noted above.   Physical Exam:    VS:  There were no vitals taken for this visit.    Affect appropriate Healthy:  appears stated age HEENT: normal Neck supple with no adenopathy JVP normal right CEA s no thyromegaly Lungs clear with no wheezing and good diaphragmatic motion Heart:  S1/S2 no murmur, no rub, gallop or click PMI normal Abdomen: benighn, BS positve, no tenderness, no AAA no bruit.  No HSM or HJR Distal pulses intact with no bruits No edema Neuro non-focal Skin warm and dry No muscular weakness Restricted ROM both shoulders    Wt Readings from Last 3 Encounters:  07/05/22 200 lb (90.7 kg)  06/14/22 205 lb (93 kg)  06/06/22 207 lb (93.9 kg)     Studies/Labs Reviewed:   EKG:    NSR, HR 65 with anterior infarct pattern and diffuse TWI  along the lateral leads which has improved when compared to his prior tracing.   Recent Labs: 11/13/2021: Magnesium 2.1 11/18/2021: ALT 18 11/26/2021: Hemoglobin 13.2; Platelets 197 12/06/2021: BUN 23; Creatinine, Ser 1.39; Potassium 4.6; Sodium 142 07/05/2022: TSH 3.920   Lipid Panel    Component Value Date/Time   CHOL 124 11/14/2021 0245   CHOL 125 02/11/2019 0847   TRIG 164 (H) 11/14/2021 0245   HDL 32 (L) 11/14/2021 0245   HDL 45 02/11/2019 0847   CHOLHDL 3.9 11/14/2021 0245   VLDL 33 11/14/2021 0245   LDLCALC 59 11/14/2021 0245   LDLCALC 64 02/11/2019 0847    Additional studies/ records that were reviewed today include:   Cardiac Catheterization: 11/15/21  Diagnostic Dominance: Right Left Anterior Descending  Vessel is large.  Non-stenotic Prox LAD lesion was previously treated.    Second Diagonal Branch  Vessel is small in size.  2nd Diag lesion is 80% stenosed.    Left Circumflex  Vessel is large.  Prox Cx to Mid Cx lesion is 20% stenosed. The lesion was previously treated using a stent (unknown type) over 2 years ago.  Dist Cx lesion is 70% stenosed.    Second Obtuse Marginal Branch  Vessel is moderate in size.  2nd Mrg lesion is 60% stenosed.    Third Obtuse Marginal Branch  Vessel is moderate in size.  3rd Mrg lesion is 90% stenosed.    Right Coronary Artery  Vessel is large.  Prox RCA to Mid RCA lesion is 80% stenosed. The lesion was previously treated .  Dist RCA lesion is 90% stenosed.    Right Posterior Descending Artery  RPDA lesion is 50% stenosed.    Intervention   Prox RCA to Mid RCA lesion  Stent  Lesion crossed with guidewire. Pre-stent angioplasty was performed. A drug-eluting stent was successfully placed using a SYNERGY XD [id number]. Stent strut is well apposed. Stent overlaps previously placed stent. Post-stent angioplasty was performed.  Post-Intervention Lesion Assessment  The  intervention was successful. Pre-interventional TIMI flow is 3. Post-intervention TIMI flow is 3. No-reflow occurred during the intervention.  There is a 0% residual stenosis post intervention.    Dist RCA lesion  Stent  Lesion crossed with guidewire. Pre-stent angioplasty was performed. A stent was successfully placed. Stent strut is well apposed. Stent does not overlap previously placed stentPost-stent angioplasty was not performed.  Post-Intervention Lesion Assessment  The intervention was successful. Pre-interventional TIMI flow is 3. Post-intervention TIMI flow is 3. No complications occurred at this lesion.  There is a 0% residual stenosis post intervention.      Echo 11/14/21   IMPRESSIONS     1. Left ventricular ejection fraction, by estimation, is 60 to 65%. The  left ventricle has normal function. The left ventricle has no regional  wall motion abnormalities. There is mild left ventricular hypertrophy.  Left ventricular diastolic parameters  are consistent with Grade I diastolic dysfunction (impaired relaxation).  The average left ventricular global longitudinal strain is -13.3 %. The  global longitudinal strain is abnormal.   2. Right ventricular systolic function is normal. The right ventricular  size is normal.   3. The mitral valve is normal in structure. Trivial mitral valve  regurgitation. No evidence of mitral stenosis.   4. The aortic valve is tricuspid. Aortic valve regurgitation is not  visualized. No aortic stenosis is present.   5. The inferior vena cava is normal in size with greater than 50%  respiratory variability, suggesting right atrial pressure  of 3 mmHg.   Comparison(s): No significant change from prior study. Prior images  reviewed side by side. 08/23/20-EF 60-65%.    Plan:   In order of problems listed above:  1. CAD:  Distant history of RCA stent and 03/2019 intervention to LAD and circumflex  Cath 11/14/21 restenosis of stents to mid/distal RCA  with repeat intervention Residual small diagonal and OM dx stents to proximal LAD and mid LCX patent Stable angina Start Ranexa 500 mg bid in addition to imdur and beta blocker Lexiscan myovue to make sure not high risk Defer repeat cath for now given prior procedural stroke Will have to pursue if myovue high risk or angina becomes crescendo  2. HTN Well controlled.  Continue current medications and low sodium Dash type diet.    3. HLD  LDL 59 11/14/21  continue crestor    4. History of CVA and another post cath cerebellar June 2023 carotids ok on Depakote f/u with neuro   5. CRF:  Baseline Cr 1.4    6. Fatigue:  non cardiac F/U primary consider SSRI. ? Also does he still need to be on Depakote and Neurontin post stroke Will f/u with Dr Margo Aye TSH normal update TTE  Lexiscan myovue Ranexa 500 mg bid   F/U  6 weeks   Signed, Charlton Haws, MD  10/20/2022 10:20 AM    [facility] [address] [place], [place] [address] Phone: [phone] Fax: [phone]

## 2022-10-24 ENCOUNTER — Ambulatory Visit: Payer: PPO | Attending: Cardiovascular Disease | Admitting: Cardiovascular Disease

## 2022-10-24 ENCOUNTER — Encounter: Payer: Self-pay | Admitting: *Deleted

## 2022-10-24 ENCOUNTER — Encounter: Payer: Self-pay | Admitting: Cardiovascular Disease

## 2022-10-24 VITALS — BP 122/66 | HR 62 | Ht 67.5 in | Wt 208.0 lb

## 2022-10-24 DIAGNOSIS — I10 Essential (primary) hypertension: Secondary | ICD-10-CM | POA: Diagnosis not present

## 2022-10-24 DIAGNOSIS — I2510 Atherosclerotic heart disease of native coronary artery without angina pectoris: Secondary | ICD-10-CM

## 2022-10-24 DIAGNOSIS — Z9889 Other specified postprocedural states: Secondary | ICD-10-CM

## 2022-10-24 DIAGNOSIS — I209 Angina pectoris, unspecified: Secondary | ICD-10-CM

## 2022-10-24 DIAGNOSIS — I25119 Atherosclerotic heart disease of native coronary artery with unspecified angina pectoris: Secondary | ICD-10-CM

## 2022-10-24 MED ORDER — RANOLAZINE ER 500 MG PO TB12: 500.0000 mg | ORAL_TABLET | Freq: Two times a day (BID) | ORAL | 11 refills | Status: DC

## 2022-10-24 NOTE — Patient Instructions (Signed)
Medication Instructions:   Start Ranexa 500 mg Two Times Daily   *If you need a refill on your cardiac medications before your next appointment, please call your pharmacy*   Lab Work: NONE   If you have labs (blood work) drawn today and your tests are completely normal, you will receive your results only by: MyChart Message (if you have MyChart) OR A paper copy in the mail If you have any lab test that is abnormal or we need to change your treatment, we will call you to review the results.   Testing/Procedures: Your physician has requested that you have a lexiscan myoview. For further information please visit [email or web address] Please follow instruction sheet, as given.    Follow-Up: At [facility], you and your health needs are our priority.  As part of our continuing mission to provide you with exceptional heart care, we have created designated Provider Care Teams.  These Care Teams include your primary Cardiologist (physician) and Advanced Practice Providers (APPs -  Physician Assistants and Nurse Practitioners) who all work together to provide you with the care you need, when you need it.  We recommend signing up for the patient portal called "MyChart".  Sign up information is provided on this After Visit Summary.  MyChart is used to connect with patients for Virtual Visits (Telemedicine).  Patients are able to view lab/test results, encounter notes, upcoming appointments, etc.  Non-urgent messages can be sent to your provider as well.   To learn more about what you can do with MyChart, go to [email or web address].    Your next appointment:    As Scheduled   Provider:   Charlton Haws, MD    Other Instructions Thank you for choosing [facility]!

## 2022-10-31 ENCOUNTER — Ambulatory Visit (HOSPITAL_COMMUNITY)
Admission: RE | Admit: 2022-10-31 | Discharge: 2022-10-31 | Disposition: A | Discharge status: Home / Self Care | Payer: PPO | Source: Ambulatory Visit | Attending: Cardiovascular Disease | Admitting: Cardiovascular Disease

## 2022-10-31 DIAGNOSIS — I2510 Atherosclerotic heart disease of native coronary artery without angina pectoris: Secondary | ICD-10-CM | POA: Insufficient documentation

## 2022-10-31 DIAGNOSIS — I25119 Atherosclerotic heart disease of native coronary artery with unspecified angina pectoris: Secondary | ICD-10-CM | POA: Diagnosis not present

## 2022-10-31 LAB — NM MYOCAR MULTI W/SPECT W/WALL MOTION / EF
LV dias vol: 60 mL (ref 62–150)
LV sys vol: 22 mL
Nuc Stress EF: 63 %
Peak HR: 80 {beats}/min
RATE: 0.4
Rest HR: 59 {beats}/min
Rest Nuclear Isotope Dose: 10.6 mCi
SDS: 3
SRS: 8
SSS: 11
ST Depression (mm): 0 mm
Stress Nuclear Isotope Dose: 30.6 mCi
TID: 0.95

## 2022-10-31 MED ORDER — TECHNETIUM TC 99M TETROFOSMIN IV KIT
10.6000 | PACK | Freq: Once | INTRAVENOUS | Status: AC | PRN
  Administered 2022-10-31: 10.6 via INTRAVENOUS

## 2022-10-31 MED ORDER — TECHNETIUM TC 99M TETROFOSMIN IV KIT
30.6000 | PACK | Freq: Once | INTRAVENOUS | Status: AC | PRN
  Administered 2022-10-31: 30.6 via INTRAVENOUS

## 2022-10-31 MED ORDER — SODIUM CHLORIDE FLUSH 0.9 % IV SOLN
INTRAVENOUS | Status: AC
  Administered 2022-10-31: 10 mL via INTRAVENOUS
  Filled 2022-10-31: qty 10

## 2022-10-31 MED ORDER — REGADENOSON 0.4 MG/5ML IV SOLN
INTRAVENOUS | Status: AC
  Administered 2022-10-31: 0.4 mg via INTRAVENOUS
  Filled 2022-10-31: qty 5

## 2022-11-16 NOTE — Progress Notes (Signed)
Cardiology Office Note    Date:  11/29/2022   ID:  Jeremy [name], DOB 31-May-1945, MRN [id number]  PCP:  Benita Stabile, MD  Cardiologist: Charlton Haws, MD     History of Present Illness:    77 y.o. previously seen by Dr Henrietta Hoover. History of CAD with stenting of the CircumlfexLAD. CRF;s HTN, HLD. PVD with history CVA and right CEA   NSTEMI 03/25/19 peak troponin 5345 ICLBBB on ECG  Cath by Dr. Clifton James on 03/26/2019 and showed 50% prox-RCA stenosis, 60% dRCA, 50% RPDA, 90% prox LCx, 99% Prox-LAD, 70% Distal LCx, 60% 2nd Mrg, 80% 2nd Diagonal and preserved EF of 50-55%. Was treated with PTCA/DESx1 to the LAD and successful scoring balloon angioplasty in the previously stented segment of LCx.   Life long DAT with stents , restenosis and CVA   Admitted 09/18/20 with chest pain diaphoresis r/o TTE normal EF no RWMAls And no acute ECG changes Cr elevated 1.9 from baseline 1.6 ACE d/c and imdur started   Admitted 11/14/21 with angina old LBBB and negative troponins .  CAth with patent stents to proximal LAD and LCX Had 80% mid RCA lesion with new stent. Has residual small vessel disease in OM and diagonals EF preserved by TTE 60-65% 11/14/21 Carotids with plaque no stenosis Had ? Embolic stroke post cath with aphasia, confusion and combativeness Was reloaded with Depakote MRI with right PCA stroke   Long discussion about need for weight loss He finished cardiac rehab 05/27/22   Seems to have some depression or cognitive slowing from stroke Wife complains that he has fatigue and can sleep for days TSH is normal discussed updating echo for persistent dyspnea and fatigue TTE 10/25/21 wth normal EF 60-65% Myovue 10/31/19 normal perfusion no ischemia EF 63% Still gets some angina with yard work  Some headaches will see [facility] next week Fatigue improved no angina     Past Medical History:  Diagnosis Date   Anxiety    Arthritis    Basal cell carcinoma 02/11/2021   nod- left dorsal hand (EXC)    CAD (coronary artery disease)    a. s/p prior stenting of LAD and RCA b. 03/2019: NSTEMI and required PTCA/DESx1 to the LAD and successful scoring balloon angioplasty in the previously stented segment of LCx   Carotid stenosis, asymptomatic, right    Carpal tunnel syndrome    bilateral   Coronary artery disease    GERD (gastroesophageal reflux disease)    History of kidney stones    Hx of colonic polyp    Hypercholesterolemia    Hypertension    Myocardial infarction (HCC)    hx of   Obesity    SCC (squamous cell carcinoma) 02/11/2021   in situ- right temple ([id number])   Squamous cell carcinoma of skin 08/09/2016   well differentiated on right outer eye - tx p bx   Stroke [facility])     Past Surgical History:  Procedure Laterality Date   CARDIAC CATHETERIZATION     CATARACT EXTRACTION W/PHACO Left 03/12/2021   Procedure: CATARACT EXTRACTION PHACO AND INTRAOCULAR LENS PLACEMENT (IOC);  Surgeon: Fabio Pierce, MD;  Location: [facility];  Service: Ophthalmology;  Laterality: Left;   CDE: 14.58   CATARACT EXTRACTION W/PHACO Right 04/08/2021   Procedure: CATARACT EXTRACTION PHACO AND INTRAOCULAR LENS PLACEMENT RIGHT EYE;  Surgeon: Fabio Pierce, MD;  Location: [facility];  Service: Ophthalmology;  Laterality: Right;  CDE 10.94   CORONARY BALLOON ANGIOPLASTY N/A 03/26/2019   Procedure:  CORONARY BALLOON ANGIOPLASTY;  Surgeon: Kathleene Hazel, MD;  Location: [facility];  Service: Cardiovascular;  Laterality: N/A;   CORONARY STENT INTERVENTION N/A 03/26/2019   Procedure: CORONARY STENT INTERVENTION;  Surgeon: Kathleene Hazel, MD;  Location: [facility];  Service: Cardiovascular;  Laterality: N/A;   CORONARY STENT INTERVENTION N/A 11/15/2021   Procedure: CORONARY STENT INTERVENTION;  Surgeon: Runell Gess, MD;  Location: [facility];  Service: Cardiovascular;  Laterality: N/A;   CORONARY STENT PLACEMENT  2000   x3    CYST EXCISION     x 2   ENDARTERECTOMY  Right 12/25/2017   Procedure: ENDARTERECTOMY CAROTID RIGHT;  Surgeon: Larina Earthly, MD;  Location: [facility];  Service: Vascular;  Laterality: Right;   LEFT HEART CATH AND CORONARY ANGIOGRAPHY N/A 03/26/2019   Procedure: LEFT HEART CATH AND CORONARY ANGIOGRAPHY;  Surgeon: Kathleene Hazel, MD;  Location: [facility];  Service: Cardiovascular;  Laterality: N/A;   LEFT HEART CATH AND CORONARY ANGIOGRAPHY N/A 11/15/2021   Procedure: LEFT HEART CATH AND CORONARY ANGIOGRAPHY;  Surgeon: Runell Gess, MD;  Location: [facility];  Service: Cardiovascular;  Laterality: N/A;   PATCH ANGIOPLASTY Right 12/25/2017   Procedure: PATCH ANGIOPLASTY RIGHT CAROTID ARTERY;  Surgeon: Larina Earthly, MD;  Location: [facility];  Service: Vascular;  Laterality: Right;   TEAR DUCT PROBING Bilateral 07/2020   done at [facility]. Dr. Mickie Kay   TEE WITHOUT CARDIOVERSION N/A 06/29/2018   Procedure: TRANSESOPHAGEAL ECHOCARDIOGRAM (TEE);  Surgeon: Pricilla Riffle, MD;  Location: [facility] ENDOSCOPY;  Service: Cardiovascular;  Laterality: N/A;    Current Medications: Outpatient Medications Prior to Visit  Medication Sig Dispense Refill   acetaminophen (TYLENOL) 500 MG tablet Take 500 mg by mouth every 6 (six) hours as needed for mild pain.     aspirin EC 81 MG EC tablet Take 1 tablet (81 mg total) by mouth daily. 90 tablet 3   carvedilol (COREG) 12.5 MG tablet Take 1 tablet (12.5 mg total) by mouth 2 (two) times daily with a meal. 180 tablet 3   clopidogrel (PLAVIX) 75 MG tablet Take 1 tablet by mouth once daily (Patient taking differently: Take 75 mg by mouth every evening.) 90 tablet 3   diltiazem 2 % GEL Apply 1 Application topically 2 (two) times daily. 30 g 2   divalproex (DEPAKOTE ER) 500 MG 24 hr tablet Take 1 tablet (500 mg total) by mouth at bedtime. 30 tablet 5   gabapentin (NEURONTIN) 300 MG capsule Take 1 capsule (300 mg total) by mouth 3 (three) times daily. 90 capsule 6   isosorbide mononitrate (IMDUR) 30  MG 24 hr tablet Take 1 tablet by mouth once daily 90 tablet 3   losartan (COZAAR) 50 MG tablet Take 1 tablet (50 mg total) by mouth daily. 90 tablet 3   nitroGLYCERIN (NITROSTAT) 0.4 MG SL tablet Place 1 tablet (0.4 mg total) under the tongue every 5 (five) minutes as needed for chest pain. 25 tablet 3   nystatin (MYCOSTATIN/NYSTOP) powder Apply 1 Application topically 2 (two) times daily as needed (skin irritation/rash.).     nystatin cream (MYCOSTATIN) Apply 1 Application topically 2 (two) times daily as needed (severe irritation/rash).     oxyCODONE (ROXICODONE) 15 MG immediate release tablet Take 15 mg by mouth every 6 (six) hours as needed for pain.     pantoprazole (PROTONIX) 40 MG tablet Take 1 tablet (40 mg total) by mouth daily. 90  tablet 3   ranolazine (RANEXA) 500 MG 12 hr tablet Take 1 tablet (500 mg total) by mouth 2 (two) times daily. 60 tablet 11   RESTASIS MULTIDOSE 0.05 % ophthalmic emulsion Place 1 drop into both eyes 2 (two) times daily as needed (dry/irritated eyes.).     rosuvastatin (CRESTOR) 40 MG tablet Take 40 mg by mouth at bedtime.     XIIDRA 5 % SOLN Place 1 drop into both eyes daily as needed (dry eye).     No facility-administered medications prior to visit.     Allergies:   Codeine, Oxycontin [oxycodone hcl], Contrast media [iodinated contrast media], and Morphine and codeine   Social History   Socioeconomic History   Marital status: Married    Spouse name: Not on file   Number of children: 2   Years of education: Not on file   Highest education level: Not on file  Occupational History   Occupation: retired    Comment: welder  Tobacco Use   Smoking status: Former    Packs/day: 2.00    Years: 20.00    Additional pack years: 0.00    Total pack years: 40.00    Types: Cigarettes    Quit date: 05/23/1988    Years since quitting: 34.5   Smokeless tobacco: Never   Tobacco comments:    smoked 2 packs per day for 20 years  Vaping Use   Vaping Use: Never  used  Substance and Sexual Activity   Alcohol use: No   Drug use: No   Sexual activity: Yes  Other Topics Concern   Not on file  Social History Narrative   Not on file   Social Determinants of Health   Financial Resource Strain: Not on file  Food Insecurity: Not on file  Transportation Needs: Not on file  Physical Activity: Not on file  Stress: Not on file  Social Connections: Not on file     Family History:  The patient's family history includes Coronary artery disease in his brother; Coronary artery disease (age of onset: 98) in his mother; Hypertension in his mother; Other (age of onset: 63) in his father; Other (age of onset: 53) in his sister; Skin cancer in his brother.   Review of Systems:   Please see the history of present illness.     General:  No chills, fever, night sweats or weight changes. Positive for bilateral shoulder pain (occurring for several months). Cardiovascular:  No chest pain, dyspnea on exertion, edema, orthopnea, palpitations, paroxysmal nocturnal dyspnea. Dermatological: No rash, lesions/masses Respiratory: No cough, dyspnea Urologic: No hematuria, dysuria Abdominal:   No nausea, vomiting, diarrhea, bright red blood per rectum, melena, or hematemesis Neurologic:  No visual changes, wkns, changes in mental status. All other systems reviewed and are otherwise negative except as noted above.   Physical Exam:    VS:  BP 108/62   Pulse 65   Ht 5\' 7"  (1.702 m)   Wt 207 lb 6.4 oz (94.1 kg)   SpO2 95%   BMI 32.48 kg/m     Affect appropriate Healthy:  appears stated age HEENT: normal Neck supple with no adenopathy JVP normal right CEA s no thyromegaly Lungs clear with no wheezing and good diaphragmatic motion Heart:  S1/S2 no murmur, no rub, gallop or click PMI normal Abdomen: benighn, BS positve, no tenderness, no AAA no bruit.  No HSM or HJR Distal pulses intact with no bruits No edema Neuro non-focal Skin warm and dry No muscular  weakness Restricted ROM both shoulders    Wt Readings from Last 3 Encounters:  11/29/22 207 lb 6.4 oz (94.1 kg)  10/24/22 208 lb (94.3 kg)  07/05/22 200 lb (90.7 kg)     Studies/Labs Reviewed:   EKG:   NSR, HR 65 with anterior infarct pattern and diffuse TWI along the lateral leads which has improved when compared to his prior tracing.   Recent Labs: 12/06/2021: BUN 23; Creatinine, Ser 1.39; Potassium 4.6; Sodium 142 07/05/2022: TSH 3.920   Lipid Panel    Component Value Date/Time   CHOL 124 11/14/2021 0245   CHOL 125 02/11/2019 0847   TRIG 164 (H) 11/14/2021 0245   HDL 32 (L) 11/14/2021 0245   HDL 45 02/11/2019 0847   CHOLHDL 3.9 11/14/2021 0245   VLDL 33 11/14/2021 0245   LDLCALC 59 11/14/2021 0245   LDLCALC 64 02/11/2019 0847    Additional studies/ records that were reviewed today include:   Cardiac Catheterization: 11/15/21  Diagnostic Dominance: Right Left Anterior Descending  Vessel is large.  Non-stenotic Prox LAD lesion was previously treated.    Second Diagonal Branch  Vessel is small in size.  2nd Diag lesion is 80% stenosed.    Left Circumflex  Vessel is large.  Prox Cx to Mid Cx lesion is 20% stenosed. The lesion was previously treated using a stent (unknown type) over 2 years ago.  Dist Cx lesion is 70% stenosed.    Second Obtuse Marginal Branch  Vessel is moderate in size.  2nd Mrg lesion is 60% stenosed.    Third Obtuse Marginal Branch  Vessel is moderate in size.  3rd Mrg lesion is 90% stenosed.    Right Coronary Artery  Vessel is large.  Prox RCA to Mid RCA lesion is 80% stenosed. The lesion was previously treated .  Dist RCA lesion is 90% stenosed.    Right Posterior Descending Artery  RPDA lesion is 50% stenosed.    Intervention   Prox RCA to Mid RCA lesion  Stent  Lesion crossed with guidewire. Pre-stent angioplasty was performed. A drug-eluting stent was successfully placed using a SYNERGY XD [id number]. Stent strut is well apposed.  Stent overlaps previously placed stent. Post-stent angioplasty was performed.  Post-Intervention Lesion Assessment  The intervention was successful. Pre-interventional TIMI flow is 3. Post-intervention TIMI flow is 3. No-reflow occurred during the intervention.  There is a 0% residual stenosis post intervention.    Dist RCA lesion  Stent  Lesion crossed with guidewire. Pre-stent angioplasty was performed. A stent was successfully placed. Stent strut is well apposed. Stent does not overlap previously placed stentPost-stent angioplasty was not performed.  Post-Intervention Lesion Assessment  The intervention was successful. Pre-interventional TIMI flow is 3. Post-intervention TIMI flow is 3. No complications occurred at this lesion.  There is a 0% residual stenosis post intervention.      Echo 11/14/21   IMPRESSIONS     1. Left ventricular ejection fraction, by estimation, is 60 to 65%. The  left ventricle has normal function. The left ventricle has no regional  wall motion abnormalities. There is mild left ventricular hypertrophy.  Left ventricular diastolic parameters  are consistent with Grade I diastolic dysfunction (impaired relaxation).  The average left ventricular global longitudinal strain is -13.3 %. The  global longitudinal strain is abnormal.   2. Right ventricular systolic function is normal. The right ventricular  size is normal.   3. The mitral valve is normal in structure. Trivial mitral valve  regurgitation. No evidence of  mitral stenosis.   4. The aortic valve is tricuspid. Aortic valve regurgitation is not  visualized. No aortic stenosis is present.   5. The inferior vena cava is normal in size with greater than 50%  respiratory variability, suggesting right atrial pressure of 3 mmHg.   Comparison(s): No significant change from prior study. Prior images  reviewed side by side. 08/23/20-EF 60-65%.   Myovue 10/31/22   Study Result  Narrative & Impression       The study is normal. There are no perfusion defects consistent with prior infarct or current ischemia.  The study is low risk.   No ST deviation was noted.   LV perfusion is normal.   Left ventricular function is normal. Nuclear stress EF: 63 %. The left ventricular ejection fraction is normal (55-65%). End diastolic cavity size is normal.   There is a large moderate intensity inferoseptal defect in the resting images not present in the stress images with normal wall motion and adjactent gut radiotracer uptake. Findings consistent with artifact.     Plan:   In order of problems listed above:  1. CAD:  Distant history of RCA stent and 03/2019 intervention to LAD and circumflex  Cath 11/14/21 restenosis of stents to mid/distal RCA with repeat intervention Residual small diagonal and OM dx stents to proximal LAD and mid LCX patent Stable angina  Ranexa 500 mg bid in addition to imdur and beta blocker Lexiscan myovue non ischemic with normal EF 10/31/22   2. HTN Well controlled.  Continue current medications and low sodium Dash type diet.    3. HLD  LDL 59 11/14/21  continue crestor    4. History of CVA and another post cath cerebellar June 2023 carotids ok on Depakote f/u with neuro Valproic Acid Level normal 07/06/22   5. CRF:  Baseline Cr 1.39    6. Fatigue:  non cardiac F/U primary consider SSRI. ? Also does he still need to be on Depakote and Neurontin post stroke Will f/u with Dr Margo Aye TSH normal EF normal by recent myovue 63%    F/U in a year   Signed, Charlton Haws, MD  11/29/2022 8:58 AM    [facility] [address] [place], [place] [address] Phone: [phone] Fax: [phone]

## 2022-11-29 ENCOUNTER — Ambulatory Visit: Payer: PPO | Attending: Cardiovascular Disease | Admitting: Cardiovascular Disease

## 2022-11-29 ENCOUNTER — Encounter: Payer: Self-pay | Admitting: Cardiovascular Disease

## 2022-11-29 VITALS — BP 108/62 | HR 65 | Ht 67.0 in | Wt 207.4 lb

## 2022-11-29 DIAGNOSIS — E785 Hyperlipidemia, unspecified: Secondary | ICD-10-CM

## 2022-11-29 DIAGNOSIS — I63431 Cerebral infarction due to embolism of right posterior cerebral artery: Secondary | ICD-10-CM

## 2022-11-29 DIAGNOSIS — I2510 Atherosclerotic heart disease of native coronary artery without angina pectoris: Secondary | ICD-10-CM | POA: Diagnosis not present

## 2022-11-29 NOTE — Patient Instructions (Signed)
Medication Instructions:  Your physician recommends that you continue on your current medications as directed. Please refer to the Current Medication list given to you today.   Labwork: None today  Testing/Procedures: None today  Follow-Up: 1 year  Any Other Special Instructions Will Be Listed Below (If Applicable).  If you need a refill on your cardiac medications before your next appointment, please call your pharmacy.  

## 2022-12-01 DIAGNOSIS — H04223 Epiphora due to insufficient drainage, bilateral lacrimal glands: Secondary | ICD-10-CM | POA: Diagnosis not present

## 2022-12-01 DIAGNOSIS — H43811 Vitreous degeneration, right eye: Secondary | ICD-10-CM | POA: Diagnosis not present

## 2022-12-01 DIAGNOSIS — H401121 Primary open-angle glaucoma, left eye, mild stage: Secondary | ICD-10-CM | POA: Diagnosis not present

## 2022-12-01 DIAGNOSIS — H40021 Open angle with borderline findings, high risk, right eye: Secondary | ICD-10-CM | POA: Diagnosis not present

## 2022-12-05 NOTE — Progress Notes (Unsigned)
[facility] [address] [place]. [place] [address] [phone]       STROKE FOLLOW UP NOTE  Jeremy [name] Date of Birth:  1946/01/09 Medical Record Number:  [id number]   Reason for visit: Stroke follow-up    SUBJECTIVE:   CHIEF COMPLAINT:  No chief complaint on file.   HPI:   Update 12/06/2022 JM: Patient returns for routine follow-up via his wife.  Stable since prior visit without new stroke/TIA symptoms.  Poststroke peripheral visual loss and occasional word finding difficulties stable.  Remains on aspirin, Plavix and Crestor.  Routinely follows with PCP for stroke risk factor management.  At prior visit, concern of excessive fatigue and LUE tremor, reduced Depakote dosage ***.    C/o HA with cards - ?zonisamide for seizure and headache    History provided for reference purposes only Update 07/05/2021 JM: Patient returns for 50-month follow-up accompanied by his wife.  Stable since prior visit, denies new stroke/TIA symptoms.  Peripheral visual loss unchanged since prior visit and occasional word mix up per wife, essentially unchanged from prior visit.  Compliant on aspirin, Plavix and Crestor.  Blood pressure well-controlled.  Routinely follows with PCP and cardiology.  Wife concerned that he is staying in bed more, was present prior to his stroke but has been gradually worsening. Will wake up not feeling well in the morning and will shortly go back to bed or will continue to become fatigued after little activity. Cardiology questioned ongoing need of Depakote and to see if this was possibly contributing which he has remained on for mood stabilization since his stroke and history of seizures.  Prior Depakote level 39.  Wife also mentions onset of LUE tremor, started shortly after prior visit, can interfere with activity/functioning and at times will drop items. Denies any pain in hand or sensation of weakness.   Initial visit 01/06/2022 JM: Patient is  being seen for initial hospital follow-up accompanied by his wife.  Reports doing well since discharge. Reports he was seen by ophthalmologist since discharge, was told he has some peripheral visual loss but is not noticeable by patient.  He has since returned back to all prior activities as well as driving without difficulty.  Wife does mention that he will occasionally mix up words or say the wrong word which has been present since his recent stroke.  This is fairly unchanged since discharge.  Denies any new stroke/TIA symptoms. He does note increased fatigue since discharge.  Does admit to significant snoring and per wife, ICU nurses reported witnessed apneas.  Reports she sleeps well throughout the night without any difficulty.  He has not previously underwent sleep study.  Remains on Depakote DR 500 mg twice daily, denies side effects. He has not had any issues with outbursts or agitation.  No seizure activity.  Recent Depakote level 39. Repeat ammonia level now WNL.   Remains on both aspirin and Plavix and Crestor, denies side effects Blood pressure today 159/66 - monitors at home and typically 120-130s  He has since had follow-up with cardiology and PCP  No further concerns at this time   Stroke admission 11/13/2021 [name] is a 77 y.o. male with history of right CEA 2019, CAD, hypertension, hyperlipidemia, history of seizure and stroke 05/2018 who was initially admitted on 11/13/2021 for intermittent chest pain and subsequently underwent cardiac cath and stenting.  Post cardiac cath on 6/26 patient was found to have confusion, difficulty speaking, somnolence and mild left arm  drift.  Neurology was consulted with MRI showing right PCA, cerebellar and punctate frontal parietal infarcts embolic likely related to cardiac cath procedure below unable to completely rule out A-fib although not seen on telemetry, Dr. Excell Seltzer did not feel monitor warranted at this time.  Carotid Doppler  unremarkable.  EF 60 to 65%.  EEG moderate diffuse encephalopathy.  LDL 59.  A1c 5.4.  On aspirin and Plavix per cardiology recommendations as well as Crestor 40 mg daily.  Prior history of seizure in 05/2018 but discontinued AEDs d/t cognitive side effects including Dilantin, Keppra and Vimpat at prior visit with Dr. Pearlean Brownie in 07/2018.  Neurology recommended starting Depakote for mood stabilization (combativeness and agitation during admission) and prior history of seizure.  Also noted low B12 levels at 192 and elevated ammonia at 41.  Recommended repeat labs at follow-up visits.  Residual deficits of left upper quadrantanopsia and advised no driving until neurology f/u. He was discharged home on 6/29.      PERTINENT IMAGING  Per hospitalization 11/15/2021 CT head no acute finding CT head and neck limited, nondiagnostic MRI right PCA, right cerebellum and punctate right frontal parietal infarcts Carotid Doppler unremarkable 2D Echo EF 60 to 65% EEG moderate diffuse encephalopathy LDL 59 HgbA1c 5.4    ROS:   14 system review of systems performed and negative with exception of those listed in HP  PMH:  Past Medical History:  Diagnosis Date   Anxiety    Arthritis    Basal cell carcinoma 02/11/2021   nod- left dorsal hand (EXC)   CAD (coronary artery disease)    a. s/p prior stenting of LAD and RCA b. 03/2019: NSTEMI and required PTCA/DESx1 to the LAD and successful scoring balloon angioplasty in the previously stented segment of LCx   Carotid stenosis, asymptomatic, right    Carpal tunnel syndrome    bilateral   Coronary artery disease    GERD (gastroesophageal reflux disease)    History of kidney stones    Hx of colonic polyp    Hypercholesterolemia    Hypertension    Myocardial infarction (HCC)    hx of   Obesity    SCC (squamous cell carcinoma) 02/11/2021   in situ- right temple ([id number])   Squamous cell carcinoma of skin 08/09/2016   well differentiated on right outer eye  - tx p bx   Stroke (HCC)     PSH:  Past Surgical History:  Procedure Laterality Date   CARDIAC CATHETERIZATION     CATARACT EXTRACTION W/PHACO Left 03/12/2021   Procedure: CATARACT EXTRACTION PHACO AND INTRAOCULAR LENS PLACEMENT (IOC);  Surgeon: Fabio Pierce, MD;  Location: [facility];  Service: Ophthalmology;  Laterality: Left;   CDE: 14.58   CATARACT EXTRACTION W/PHACO Right 04/08/2021   Procedure: CATARACT EXTRACTION PHACO AND INTRAOCULAR LENS PLACEMENT RIGHT EYE;  Surgeon: Fabio Pierce, MD;  Location: [facility];  Service: Ophthalmology;  Laterality: Right;  CDE 10.94   CORONARY BALLOON ANGIOPLASTY N/A 03/26/2019   Procedure: CORONARY BALLOON ANGIOPLASTY;  Surgeon: Kathleene Hazel, MD;  Location: [facility];  Service: Cardiovascular;  Laterality: N/A;   CORONARY STENT INTERVENTION N/A 03/26/2019   Procedure: CORONARY STENT INTERVENTION;  Surgeon: Kathleene Hazel, MD;  Location: [facility];  Service: Cardiovascular;  Laterality: N/A;   CORONARY STENT INTERVENTION N/A 11/15/2021   Procedure: CORONARY STENT INTERVENTION;  Surgeon: Runell Gess, MD;  Location: [facility];  Service: Cardiovascular;  Laterality: N/A;   CORONARY STENT PLACEMENT  2000   x3    CYST EXCISION     x 2   ENDARTERECTOMY Right 12/25/2017   Procedure: ENDARTERECTOMY CAROTID RIGHT;  Surgeon: Larina Earthly, MD;  Location[facility] OR;  Service: Vascular;  Laterality: Right;   LEFT HEART CATH AND CORONARY ANGIOGRAPHY N/A 03/26/2019   Procedure: LEFT HEART CATH AND CORONARY ANGIOGRAPHY;  Surgeon: Kathleene Hazel, MD;  Location: [facility];  Service: Cardiovascular;  Laterality: N/A;   LEFT HEART CATH AND CORONARY ANGIOGRAPHY N/A 11/15/2021   Procedure: LEFT HEART CATH AND CORONARY ANGIOGRAPHY;  Surgeon: Runell Gess, MD;  Location: [facility];  Service: Cardiovascular;  Laterality: N/A;   PATCH ANGIOPLASTY Right 12/25/2017   Procedure: PATCH ANGIOPLASTY  RIGHT CAROTID ARTERY;  Surgeon: Larina Earthly, MD;  Location: [facility] OR;  Service: Vascular;  Laterality: Right;   TEAR DUCT PROBING Bilateral 07/2020   done at [facility]. Dr. Mickie Kay   TEE WITHOUT CARDIOVERSION N/A 06/29/2018   Procedure: TRANSESOPHAGEAL ECHOCARDIOGRAM (TEE);  Surgeon: Pricilla Riffle, MD;  Location: [facility] ENDOSCOPY;  Service: Cardiovascular;  Laterality: N/A;    Social History:  Social History   Socioeconomic History   Marital status: Married    Spouse name: Not on file   Number of children: 2   Years of education: Not on file   Highest education level: Not on file  Occupational History   Occupation: retired    Comment: welder  Tobacco Use   Smoking status: Former    Current packs/day: 0.00    Average packs/day: 2.0 packs/day for 20.0 years (40.0 ttl pk-yrs)    Types: Cigarettes    Start date: 05/23/1968    Quit date: 05/23/1988    Years since quitting: 34.5   Smokeless tobacco: Never   Tobacco comments:    smoked 2 packs per day for 20 years  Vaping Use   Vaping status: Never Used  Substance and Sexual Activity   Alcohol use: No   Drug use: No   Sexual activity: Yes  Other Topics Concern   Not on file  Social History Narrative   Not on file   Social Determinants of Health   Financial Resource Strain: Not on file  Food Insecurity: Not on file  Transportation Needs: Not on file  Physical Activity: Not on file  Stress: Not on file  Social Connections: Not on file  Intimate Partner Violence: Not on file    Family History:  Family History  Problem Relation Age of Onset   Coronary artery disease Mother 75       deceased   Hypertension Mother    Other Father 58       deceased old age   Other Sister 70       living and healthy   Coronary artery disease Brother    Skin cancer Brother    Colon cancer Neg Hx    Pancreatic cancer Neg Hx    Stomach cancer Neg Hx    Esophageal cancer Neg Hx    Liver disease Neg Hx     Medications:   Current Outpatient  Medications on File Prior to Visit  Medication Sig Dispense Refill   acetaminophen (TYLENOL) 500 MG tablet Take 500 mg by mouth every 6 (six) hours as needed for mild pain.     aspirin EC 81 MG EC tablet Take 1 tablet (81 mg total) by mouth daily. 90 tablet 3   carvedilol (COREG) 12.5 MG tablet Take 1 tablet (  12.5 mg total) by mouth 2 (two) times daily with a meal. 180 tablet 3   clopidogrel (PLAVIX) 75 MG tablet Take 1 tablet by mouth once daily (Patient taking differently: Take 75 mg by mouth every evening.) 90 tablet 3   diltiazem 2 % GEL Apply 1 Application topically 2 (two) times daily. 30 g 2   divalproex (DEPAKOTE ER) 500 MG 24 hr tablet Take 1 tablet (500 mg total) by mouth at bedtime. 30 tablet 5   gabapentin (NEURONTIN) 300 MG capsule Take 1 capsule (300 mg total) by mouth 3 (three) times daily. 90 capsule 6   isosorbide mononitrate (IMDUR) 30 MG 24 hr tablet Take 1 tablet by mouth once daily 90 tablet 3   losartan (COZAAR) 50 MG tablet Take 1 tablet (50 mg total) by mouth daily. 90 tablet 3   nitroGLYCERIN (NITROSTAT) 0.4 MG SL tablet Place 1 tablet (0.4 mg total) under the tongue every 5 (five) minutes as needed for chest pain. 25 tablet 3   nystatin (MYCOSTATIN/NYSTOP) powder Apply 1 Application topically 2 (two) times daily as needed (skin irritation/rash.).     nystatin cream (MYCOSTATIN) Apply 1 Application topically 2 (two) times daily as needed (severe irritation/rash).     oxyCODONE (ROXICODONE) 15 MG immediate release tablet Take 15 mg by mouth every 6 (six) hours as needed for pain.     pantoprazole (PROTONIX) 40 MG tablet Take 1 tablet (40 mg total) by mouth daily. 90 tablet 3   ranolazine (RANEXA) 500 MG 12 hr tablet Take 1 tablet (500 mg total) by mouth 2 (two) times daily. 60 tablet 11   RESTASIS MULTIDOSE 0.05 % ophthalmic emulsion Place 1 drop into both eyes 2 (two) times daily as needed (dry/irritated eyes.).     rosuvastatin (CRESTOR) 40 MG tablet Take 40 mg by mouth at  bedtime.     XIIDRA 5 % SOLN Place 1 drop into both eyes daily as needed (dry eye).     No current facility-administered medications on file prior to visit.    Allergies:   Allergies  Allergen Reactions   Codeine Anaphylaxis   Oxycontin [Oxycodone Hcl] Itching    Through IV- has since had oral oxycodone without issue   Contrast Media [Iodinated Contrast Media]     Patient got very nauseated. Gave 4mg  Zofran. Possibly pre medicate with Zofran    Morphine And Codeine Itching      OBJECTIVE:  Physical Exam  There were no vitals filed for this visit.   There is no height or weight on file to calculate BMI. No results found.  General: well developed, well nourished, pleasant elderly Caucasian male, seated, in no evident distress Head: head normocephalic and atraumatic.   Neck: supple with no carotid or supraclavicular bruits Cardiovascular: regular rate and rhythm, no murmurs Musculoskeletal: no deformity Skin:  no rash/petichiae Vascular:  Normal pulses all extremities   Neurologic Exam Mental Status: Awake and fully alert.  Some speech hesitancy, unable to appreciate aphasia or dysarthria.  Oriented to place and time. Recent and remote memory intact. Attention span, concentration and fund of knowledge appropriate. Mood and affect appropriate.  Cranial Nerves: Pupils equal, briskly reactive to light. Extraocular movements full without nystagmus. Visual fields full to confrontation (unable to appreciate any peripheral visual loss on confrontation testing). Hearing intact. Facial sensation intact. Face, tongue, palate moves normally and symmetrically.  Motor: Normal bulk and tone. Normal strength in all tested extremity muscles.  Postural tremor noted L>R UE, mild isometric tremor  of BLE, no tremor in head/neck, no cogwheel rigidity or bradykinesia Sensory.: intact to touch , pinprick , position and vibratory sensation.  Coordination: Rapid alternating movements normal in all  extremities. Finger-to-nose and heel-to-shin performed accurately bilaterally. Gait and Station: Arises from chair without difficulty. Stance is normal. Gait demonstrates normal stride length and balance without use of AD. Reflexes: 1+ and symmetric. Toes downgoing.         ASSESSMENT: Jeremy [name] is a 77 y.o. year old male with right PCA, cerebellar and punctate frontoparietal infarcts on 11/15/2021 likely related to cardiac cath procedure. Vascular risk factors include history of right CEA, history of seizure and stroke 2020, HTN, HLD, B12 deficiency, CAD, CHF and advanced age.  New complaint of LUE tremor although noted generalized postural tremor in extremities on exam and ongoing concern of excessive daytime fatigue     PLAN:  Recent stroke:  Hx of prior strokes Residual deficit: Occasional word mixup and peripheral vision impairment - stable, continue to follow with ophthalmology Continue aspirin 81 mg daily and clopidogrel 75 mg daily  and Crestor per cardiology recommendations and for secondary stroke prevention.   Discussed secondary stroke prevention measures and importance of close PCP follow up for aggressive stroke risk factor management including BP goal<130/90, and HLD with LDL goal<70 I have gone over the pathophysiology of stroke, warning signs and symptoms, risk factors and their management in some detail with instructions to go to the closest emergency room for symptoms of concern.  Hx of seizure: Mood stabilization: No recent seizure activity Depakote could be potentially causing generalized tremor and fatigue, will decrease dose from 500mg  BID to DR 500mg  nightly - advised to monitor for any seizure activity Valproic acid level 79 (06/2022) Hx of elevated ammonia now normal  Postural tremor Excessive daytime fatigue Unknown etiology Possibly medication side effect as noted above - med adjustment made B12 and TSH 06/2022 satisfactory  Suspected sleep  apnea Daytime fatigue, snoring and witnessed apneas Continues to decline sleep evaluation - discussed risks of untreated sleep apnea.  He was advised to call if he wishes to pursue in the future    Follow up in 5 months or call earlier if needed   CC:  PCP: Benita Stabile, MD    I spent 39 minutes of face-to-face and non-face-to-face time with patient and wife.  This included previsit chart review, lab review, study review, order entry, electronic health record documentation, patient and wife education and discussion regarding above diagnoses and treatment plan and answered all the questions to patient and wife satisfaction  Ihor Austin, AGNP-BC  [facility] [address] [place], [place] [address]  Phone [phone] Fax [phone] Note: This document was prepared with digital dictation and possible smart phrase technology. Any transcriptional errors that result from this process are unintentional.

## 2022-12-06 ENCOUNTER — Ambulatory Visit: Payer: PPO | Admitting: Adult Health

## 2022-12-06 ENCOUNTER — Encounter: Payer: Self-pay | Admitting: Adult Health

## 2022-12-06 ENCOUNTER — Telehealth: Payer: Self-pay | Admitting: Adult Health

## 2022-12-06 VITALS — BP 116/68 | HR 65 | Ht 68.0 in | Wt 205.0 lb

## 2022-12-06 DIAGNOSIS — R413 Other amnesia: Secondary | ICD-10-CM | POA: Diagnosis not present

## 2022-12-06 DIAGNOSIS — I639 Cerebral infarction, unspecified: Secondary | ICD-10-CM

## 2022-12-06 DIAGNOSIS — R569 Unspecified convulsions: Secondary | ICD-10-CM | POA: Diagnosis not present

## 2022-12-06 DIAGNOSIS — R29818 Other symptoms and signs involving the nervous system: Secondary | ICD-10-CM

## 2022-12-06 DIAGNOSIS — R519 Headache, unspecified: Secondary | ICD-10-CM

## 2022-12-06 MED ORDER — DIVALPROEX SODIUM ER 250 MG PO TB24: 250.0000 mg | ORAL_TABLET | Freq: Every day | ORAL | 5 refills | Status: DC

## 2022-12-06 NOTE — Telephone Encounter (Signed)
[employer] adv NPR sent to [employer] [phone]

## 2022-12-06 NOTE — Patient Instructions (Addendum)
Reduce depakote dose to 250mg  nightly - please let me know if fatigue persists  If headaches should return or worsen, please let me know  Continue gabapentin 300mg  twice daily for now - can consider decreasing in the future  You will be called to complete an MRI of your brain - if you are not called by beginning of next week to schedule, please let me know   Continue aspirin 81 mg daily and clopidogrel 75 mg daily  and Crestor  for secondary stroke prevention  Continue to follow up with PCP regarding blood pressure and cholesterol management  Maintain strict control of hypertension with blood pressure goal below 130/90 and cholesterol with LDL cholesterol (bad cholesterol) goal below 70 mg/dL.   Signs of a Stroke? Follow the BEFAST method:  Balance Watch for a sudden loss of balance, trouble with coordination or vertigo Eyes Is there a sudden loss of vision in one or both eyes? Or double vision?  Face: Ask the person to smile. Does one side of the face droop or is it numb?  Arms: Ask the person to raise both arms. Does one arm drift downward? Is there weakness or numbness of a leg? Speech: Ask the person to repeat a simple phrase. Does the speech sound slurred/strange? Is the person confused ? Time: If you observe any of these signs, call 911.     Followup in the future with me in 4 months or call earlier if needed       Thank you for coming to see [place] at [facility]. I hope we have been able to provide you high quality care today.  You may receive a patient satisfaction survey over the next few weeks. We would appreciate your feedback and comments so that we may continue to improve ourselves and the health of our patients.

## 2022-12-12 ENCOUNTER — Other Ambulatory Visit: Payer: Self-pay | Admitting: Nurse Practitioner

## 2022-12-17 NOTE — Progress Notes (Signed)
I agree with the above plan 

## 2022-12-27 ENCOUNTER — Other Ambulatory Visit: Payer: Self-pay | Admitting: Cardiovascular Disease

## 2023-01-02 ENCOUNTER — Ambulatory Visit (HOSPITAL_COMMUNITY)
Admission: RE | Admit: 2023-01-02 | Discharge: 2023-01-02 | Disposition: A | Discharge status: Home / Self Care | Payer: PPO | Source: Ambulatory Visit | Attending: Adult Health | Admitting: Adult Health

## 2023-01-02 DIAGNOSIS — R413 Other amnesia: Secondary | ICD-10-CM | POA: Insufficient documentation

## 2023-01-02 DIAGNOSIS — R519 Headache, unspecified: Secondary | ICD-10-CM | POA: Insufficient documentation

## 2023-01-02 DIAGNOSIS — Z471 Aftercare following joint replacement surgery: Secondary | ICD-10-CM | POA: Diagnosis not present

## 2023-01-02 DIAGNOSIS — R29818 Other symptoms and signs involving the nervous system: Secondary | ICD-10-CM | POA: Insufficient documentation

## 2023-01-02 DIAGNOSIS — G9389 Other specified disorders of brain: Secondary | ICD-10-CM | POA: Diagnosis not present

## 2023-01-02 DIAGNOSIS — G319 Degenerative disease of nervous system, unspecified: Secondary | ICD-10-CM | POA: Diagnosis not present

## 2023-01-03 DIAGNOSIS — E1122 Type 2 diabetes mellitus with diabetic chronic kidney disease: Secondary | ICD-10-CM | POA: Diagnosis not present

## 2023-01-03 DIAGNOSIS — E782 Mixed hyperlipidemia: Secondary | ICD-10-CM | POA: Diagnosis not present

## 2023-01-03 DIAGNOSIS — I10 Essential (primary) hypertension: Secondary | ICD-10-CM | POA: Diagnosis not present

## 2023-01-09 DIAGNOSIS — N1831 Chronic kidney disease, stage 3a: Secondary | ICD-10-CM | POA: Diagnosis not present

## 2023-01-09 DIAGNOSIS — Z87891 Personal history of nicotine dependence: Secondary | ICD-10-CM | POA: Diagnosis not present

## 2023-01-09 DIAGNOSIS — Z125 Encounter for screening for malignant neoplasm of prostate: Secondary | ICD-10-CM | POA: Diagnosis not present

## 2023-01-09 DIAGNOSIS — E669 Obesity, unspecified: Secondary | ICD-10-CM | POA: Diagnosis not present

## 2023-01-09 DIAGNOSIS — I639 Cerebral infarction, unspecified: Secondary | ICD-10-CM | POA: Diagnosis not present

## 2023-01-09 DIAGNOSIS — E782 Mixed hyperlipidemia: Secondary | ICD-10-CM | POA: Diagnosis not present

## 2023-01-09 DIAGNOSIS — I2510 Atherosclerotic heart disease of native coronary artery without angina pectoris: Secondary | ICD-10-CM | POA: Diagnosis not present

## 2023-01-09 DIAGNOSIS — I129 Hypertensive chronic kidney disease with stage 1 through stage 4 chronic kidney disease, or unspecified chronic kidney disease: Secondary | ICD-10-CM | POA: Diagnosis not present

## 2023-01-09 DIAGNOSIS — Z8673 Personal history of transient ischemic attack (TIA), and cerebral infarction without residual deficits: Secondary | ICD-10-CM | POA: Diagnosis not present

## 2023-01-09 DIAGNOSIS — Z6834 Body mass index (BMI) 34.0-34.9, adult: Secondary | ICD-10-CM | POA: Diagnosis not present

## 2023-01-09 DIAGNOSIS — G629 Polyneuropathy, unspecified: Secondary | ICD-10-CM | POA: Diagnosis not present

## 2023-01-09 DIAGNOSIS — G8929 Other chronic pain: Secondary | ICD-10-CM | POA: Diagnosis not present

## 2023-03-23 ENCOUNTER — Other Ambulatory Visit: Payer: Self-pay | Admitting: Cardiovascular Disease

## 2023-03-29 DIAGNOSIS — M545 Low back pain, unspecified: Secondary | ICD-10-CM | POA: Insufficient documentation

## 2023-03-30 NOTE — Progress Notes (Signed)
[facility] [address] [place]. [place] [address] [phone]       STROKE FOLLOW UP NOTE  Mr. MALEAK MCNEVIN Date of Birth:  06/27/1945 Medical Record Number:  [id number]   Reason for visit: Stroke follow-up    SUBJECTIVE:   CHIEF COMPLAINT:  Chief Complaint  Patient presents with   Follow-up    Rm 3, here with wife Annice Pih  Pt is here for stroke follow up. Pt states his tremors seem to be getting a little worse, mostly on the right hand. Pt states that he had an episode of feeling off balance yesterday that lasted for about 2 hours.     HPI:   Update 04/03/2023 JM: Patient returns for follow-up visit accompanied by his wife.  Patient reports overall he has been doing well since prior visit.  He did have an episode yesterday during church where he started to feel lightheaded, this lasted for about 2 hours, went home after church and took a nap. This is not new, occurs intermittently.  Does admit to poor water intake, did not eat that morning, ate pizza prior to leaving church with some improvement of symptoms.  No other associated symptoms.  Wife believes tremor slightly worse, not noticeable to the patient.  No recurrent headaches. MRI brain did not show any new strokes, showed expected evolution of prior infarcts.  Denies any seizure activity.  Previously reduced Depakote dosage to 250 mg nightly due to excessive daytime fatigue with noted improvement. Mood has been stable. He questions further reducing dosage.  Denies new stroke/TIA symptoms.  Compliant on aspirin, Plavix and Crestor.  Routinely follows with PCP and cardiology.  No further questions or concerns at this time.    History provided for reference purposes only Update 12/06/2022 JM: Patient returns for routine follow-up via his wife.patient mentions about 1 month ago, he had a severe occipital headache that was debilitating, denies any neck pain and radiating symptoms.  Per wife he just looked bad  and had a "distant look". He spent the day in bed. She tried to encourage him to go to the emergency room but he declined. Denies any new extremity or facial weakness, speech changes, cognitive changes or vision changes during that time. He does feel like his balance has been worse since that time feeling more unsteady especially on stairs and per wife he has been more moody. Ambulates without assistive device, denies any recent falls.  Continues to have memory difficulties but not necessarily worsened since that time.  He has had 1 or 2 mild headaches since that time but none over the past couple of weeks.  Continued poststroke peripheral visual loss and occasional word finding difficulties without change.  Routinely follows with ophthalmology with stable visual exam.  At prior visit, concern of excessive fatigue and LUE tremor, reduced Depakote dosage down to 500mg  nightly which helped some where he isn't sleeping as much during the day and tremor only on occasion but still has daytime fatigue.  He also continues on gabapentin 300 mg twice daily which was started back in 2020 per wife for seizure prevention.  His daytime fatigue complaints has been more severe/significant after his stroke in 2023. Remains on aspirin, Plavix and Crestor.  Routinely follows with PCP for stroke risk factor management and routinely follows with cardiology.  Update 07/05/2021 JM: Patient returns for 69-month follow-up accompanied by his wife.  Stable since prior visit, denies new stroke/TIA symptoms.  Peripheral visual loss unchanged since prior visit  and occasional word mix up per wife, essentially unchanged from prior visit.  Compliant on aspirin, Plavix and Crestor.  Blood pressure well-controlled.  Routinely follows with PCP and cardiology.  Wife concerned that he is staying in bed more, was present prior to his stroke but has been gradually worsening. Will wake up not feeling well in the morning and will shortly go back to bed  or will continue to become fatigued after little activity. Cardiology questioned ongoing need of Depakote and to see if this was possibly contributing which he has remained on for mood stabilization since his stroke and history of seizures.  Prior Depakote level 39.  Wife also mentions onset of LUE tremor, started shortly after prior visit, can interfere with activity/functioning and at times will drop items. Denies any pain in hand or sensation of weakness.   Initial visit 01/06/2022 JM: Patient is being seen for initial hospital follow-up accompanied by his wife.  Reports doing well since discharge. Reports he was seen by ophthalmologist since discharge, was told he has some peripheral visual loss but is not noticeable by patient.  He has since returned back to all prior activities as well as driving without difficulty.  Wife does mention that he will occasionally mix up words or say the wrong word which has been present since his recent stroke.  This is fairly unchanged since discharge.  Denies any new stroke/TIA symptoms. He does note increased fatigue since discharge.  Does admit to significant snoring and per wife, ICU nurses reported witnessed apneas.  Reports she sleeps well throughout the night without any difficulty.  He has not previously underwent sleep study.  Remains on Depakote DR 500 mg twice daily, denies side effects. He has not had any issues with outbursts or agitation.  No seizure activity.  Recent Depakote level 39. Repeat ammonia level now WNL.   Remains on both aspirin and Plavix and Crestor, denies side effects Blood pressure today 159/66 - monitors at home and typically 120-130s  He has since had follow-up with cardiology and PCP  No further concerns at this time   Stroke admission 11/13/2021 Mr. HENRIE DAVTYAN is a 77 y.o. male with history of right CEA 2019, CAD, hypertension, hyperlipidemia, history of seizure and stroke 05/2018 who was initially admitted on 11/13/2021 for  intermittent chest pain and subsequently underwent cardiac cath and stenting.  Post cardiac cath on 6/26 patient was found to have confusion, difficulty speaking, somnolence and mild left arm drift.  Neurology was consulted with MRI showing right PCA, cerebellar and punctate frontal parietal infarcts embolic likely related to cardiac cath procedure below unable to completely rule out A-fib although not seen on telemetry, Dr. Excell Seltzer did not feel monitor warranted at this time.  Carotid Doppler unremarkable.  EF 60 to 65%.  EEG moderate diffuse encephalopathy.  LDL 59.  A1c 5.4.  On aspirin and Plavix per cardiology recommendations as well as Crestor 40 mg daily.  Prior history of seizure in 05/2018 but discontinued AEDs d/t cognitive side effects including Dilantin, Keppra and Vimpat at prior visit with Dr. Pearlean Brownie in 07/2018.  Neurology recommended starting Depakote for mood stabilization (combativeness and agitation during admission) and prior history of seizure.  Also noted low B12 levels at 192 and elevated ammonia at 41.  Recommended repeat labs at follow-up visits.  Residual deficits of left upper quadrantanopsia and advised no driving until neurology f/u. He was discharged home on 6/29.      PERTINENT IMAGING  Per hospitalization  11/15/2021 CT head no acute finding CT head and neck limited, nondiagnostic MRI right PCA, right cerebellum and punctate right frontal parietal infarcts Carotid Doppler unremarkable 2D Echo EF 60 to 65% EEG moderate diffuse encephalopathy LDL 59 HgbA1c 5.4    ROS:   14 system review of systems performed and negative with exception of those listed in HP  PMH:  Past Medical History:  Diagnosis Date   Anxiety    Arthritis    Basal cell carcinoma 02/11/2021   nod- left dorsal hand (EXC)   CAD (coronary artery disease)    a. s/p prior stenting of LAD and RCA b. 03/2019: NSTEMI and required PTCA/DESx1 to the LAD and successful scoring balloon angioplasty in the  previously stented segment of LCx   Carotid stenosis, asymptomatic, right    Carpal tunnel syndrome    bilateral   Coronary artery disease    GERD (gastroesophageal reflux disease)    History of kidney stones    Hx of colonic polyp    Hypercholesterolemia    Hypertension    Myocardial infarction (HCC)    hx of   Obesity    SCC (squamous cell carcinoma) 02/11/2021   in situ- right temple (CX35FU)   Squamous cell carcinoma of skin 08/09/2016   well differentiated on right outer eye - tx p bx   Stroke (HCC)     PSH:  Past Surgical History:  Procedure Laterality Date   CARDIAC CATHETERIZATION     CATARACT EXTRACTION W/PHACO Left 03/12/2021   Procedure: CATARACT EXTRACTION PHACO AND INTRAOCULAR LENS PLACEMENT (IOC);  Surgeon: Fabio Pierce, MD;  Location: [facility];  Service: Ophthalmology;  Laterality: Left;   CDE: 14.58   CATARACT EXTRACTION W/PHACO Right 04/08/2021   Procedure: CATARACT EXTRACTION PHACO AND INTRAOCULAR LENS PLACEMENT RIGHT EYE;  Surgeon: Fabio Pierce, MD;  Location: [facility];  Service: Ophthalmology;  Laterality: Right;  CDE 10.94   CORONARY BALLOON ANGIOPLASTY N/A 03/26/2019   Procedure: CORONARY BALLOON ANGIOPLASTY;  Surgeon: Kathleene Hazel, MD;  Location: [facility];  Service: Cardiovascular;  Laterality: N/A;   CORONARY STENT INTERVENTION N/A 03/26/2019   Procedure: CORONARY STENT INTERVENTION;  Surgeon: Kathleene Hazel, MD;  Location: [facility];  Service: Cardiovascular;  Laterality: N/A;   CORONARY STENT INTERVENTION N/A 11/15/2021   Procedure: CORONARY STENT INTERVENTION;  Surgeon: Runell Gess, MD;  Location: [facility];  Service: Cardiovascular;  Laterality: N/A;   CORONARY STENT PLACEMENT  2000   x3    CYST EXCISION     x 2   ENDARTERECTOMY Right 12/25/2017   Procedure: ENDARTERECTOMY CAROTID RIGHT;  Surgeon: Larina Earthly, MD;  Location: [facility];  Service: Vascular;  Laterality: Right;   LEFT HEART CATH AND  CORONARY ANGIOGRAPHY N/A 03/26/2019   Procedure: LEFT HEART CATH AND CORONARY ANGIOGRAPHY;  Surgeon: Kathleene Hazel, MD;  [facility]: [facility];  Service: Cardiovascular;  Laterality: N/A;   LEFT HEART CATH AND CORONARY ANGIOGRAPHY N/A 11/15/2021   Procedure: LEFT HEART CATH AND CORONARY ANGIOGRAPHY;  Surgeon: Runell Gess, MD;  [facility]: [facility];  Service: Cardiovascular;  Laterality: N/A;   PATCH ANGIOPLASTY Right 12/25/2017   Procedure: PATCH ANGIOPLASTY RIGHT CAROTID ARTERY;  Surgeon: Larina Earthly, MD;  Location: [facility];  Service: Vascular;  Laterality: Right;   TEAR DUCT PROBING Bilateral 07/2020   done at [facility]. Dr. Mickie Kay   TEE WITHOUT CARDIOVERSION N/A 06/29/2018   Procedure: TRANSESOPHAGEAL ECHOCARDIOGRAM (TEE);  Surgeon: Pricilla Riffle, MD;  Location: [facility]OS[facility];  Service: Cardiovascular;  Laterality: N/A;    Social History:  Social History   Socioeconomic History   Marital status: Married    Spouse name: Not on file   Number of children: 2   Years of education: Not on file   Highest education level: Not on file  Occupational History   Occupation: retired    Comment: welder  Tobacco Use   Smoking status: Former    Current packs/day: 0.00    Average packs/day: 2.0 packs/day for 20.0 years (40.0 ttl pk-yrs)    Types: Cigarettes    Start date: 05/23/1968    Quit date: 05/23/1988    Years since quitting: 34.8   Smokeless tobacco: Never   Tobacco comments:    smoked 2 packs per day for 20 years  Vaping Use   Vaping status: Never Used  Substance and Sexual Activity   Alcohol use: No   Drug use: No   Sexual activity: Yes  Other Topics Concern   Not on file  Social History Narrative   Not on file   Social Determinants of Health   Financial Resource Strain: Not on file  Food Insecurity: Not on file  Transportation Needs: Not on file  Physical Activity: Not on file  Stress: Not on file  Social Connections: Not on file  Intimate  Partner Violence: Not on file    Family History:  Family History  Problem Relation Age of Onset   Coronary artery disease Mother 22       deceased   Hypertension Mother    Other Father 71       deceased old age   Other Sister 78       living and healthy   Coronary artery disease Brother    Skin cancer Brother    Colon cancer Neg Hx    Pancreatic cancer Neg Hx    Stomach cancer Neg Hx    Esophageal cancer Neg Hx    Liver disease Neg Hx     Medications:   Current Outpatient Medications on File Prior to Visit  Medication Sig Dispense Refill   acetaminophen (TYLENOL) 500 MG tablet Take 500 mg by mouth every 6 (six) hours as needed for mild pain.     aspirin EC 81 MG EC tablet Take 1 tablet (81 mg total) by mouth daily. 90 tablet 3   carvedilol (COREG) 12.5 MG tablet TAKE 1 TABLET BY MOUTH TWICE DAILY WITH A MEAL 180 tablet 2   clopidogrel (PLAVIX) 75 MG tablet Take 1 tablet by mouth once daily 90 tablet 3   diltiazem 2 % GEL Apply 1 Application topically 2 (two) times daily. 30 g 2   divalproex (DEPAKOTE ER) 250 MG 24 hr tablet Take 1 tablet (250 mg total) by mouth at bedtime. 30 tablet 5   gabapentin (NEURONTIN) 300 MG capsule Take 1 capsule (300 mg total) by mouth 3 (three) times daily. 90 capsule 6   isosorbide mononitrate (IMDUR) 30 MG 24 hr tablet Take 1 tablet by mouth once daily 90 tablet 3   losartan (COZAAR) 50 MG tablet Take 1 tablet by mouth once daily 90 tablet 3   nitroGLYCERIN (NITROSTAT) 0.4 MG SL tablet Place 1 tablet (0.4 mg total) under the tongue every 5 (five) minutes as needed for chest pain. 25 tablet 3   oxyCODONE (ROXICODONE) 15 MG immediate release tablet Take 15 mg by mouth every 6 (six) hours as needed for  pain.     pantoprazole (PROTONIX) 40 MG tablet Take 1 tablet (40 mg total) by mouth daily. 90 tablet 3   ranolazine (RANEXA) 500 MG 12 hr tablet Take 1 tablet (500 mg total) by mouth 2 (two) times daily. 60 tablet 11   RESTASIS MULTIDOSE 0.05 %  ophthalmic emulsion Place 1 drop into both eyes 2 (two) times daily as needed (dry/irritated eyes.).     rosuvastatin (CRESTOR) 40 MG tablet Take 40 mg by mouth at bedtime.     XIIDRA 5 % SOLN Place 1 drop into both eyes daily as needed (dry eye).     nystatin (MYCOSTATIN/NYSTOP) powder Apply 1 Application topically 2 (two) times daily as needed (skin irritation/rash.). (Patient not taking: Reported on 04/03/2023)     nystatin cream (MYCOSTATIN) Apply 1 Application topically 2 (two) times daily as needed (severe irritation/rash). (Patient not taking: Reported on 04/03/2023)     No current facility-administered medications on file prior to visit.    Allergies:   Allergies  Allergen Reactions   Codeine Anaphylaxis   Oxycontin [Oxycodone Hcl] Itching    Through IV- has since had oral oxycodone without issue   Contrast Media [Iodinated Contrast Media]     Patient got very nauseated. Gave 4mg  Zofran. Possibly pre medicate with Zofran    Morphine And Codeine Itching      OBJECTIVE:  Physical Exam  Vitals:   04/03/23 0739  BP: 139/70  Pulse: 67  Weight: 205 lb (93 kg)  Height: 5\' 7"  (1.702 m)    Body mass index is 32.11 kg/m. No results found.  General: well developed, well nourished, pleasant elderly Caucasian male, seated, in no evident distress Head: head normocephalic and atraumatic.   Neck: supple with no carotid or supraclavicular bruits Cardiovascular: regular rate and rhythm, no murmurs Musculoskeletal: no deformity Skin:  no rash/petichiae Vascular:  Normal pulses all extremities   Neurologic Exam Mental Status: Awake and fully alert.  Some speech hesitancy, unable to appreciate aphasia or dysarthria.  Oriented to place and time. Recent memory impaired and remote memory intact. Attention span, concentration and fund of knowledge appropriate during visit. Mood and affect appropriate during visit.  Cranial Nerves: Pupils equal, briskly reactive to light. Extraocular  movements full without nystagmus. Visual fields partial left upper homonymous quadrantanopia.  Hearing intact. Facial sensation intact. tongue, palate moves normally and symmetrically. Left nasolabial fold flattening. Motor: Normal bulk and tone. Normal strength in all tested extremity muscles except slightly decreased left hand grip.  Slight postural tremor noted R>L UE, mild isometric tremor of BLE, no tremor in head/neck, no cogwheel rigidity or bradykinesia.  Does not have resting tremor. Sensory.: intact to touch , pinprick , position and vibratory sensation.  Coordination: Rapid alternating movements normal in all extremities except slightly decreased left hand. Finger-to-nose and heel-to-shin performed accurately bilaterally. Gait and Station: Arises from chair without difficulty. Stance is normal. Gait demonstrates wide-based gait with mild swaying.  Able to tandem walk and heel toe with mild to moderate difficulty. Reflexes: 1+ and symmetric. Toes downgoing.        ASSESSMENT: RANNIE GEORGIADES is a 77 y.o. year old male with right PCA, cerebellar and punctate frontoparietal infarcts on 11/15/2021 likely related to cardiac cath procedure. Vascular risk factors include history of right CEA, history of seizure and stroke 2020, HTN, HLD, B12 deficiency, CAD, CHF and advanced age.  Prior concern of LUE tremor and excessive daytime fatigue which improved some after lowering Depakote dosage.  Complained  of occipital headache 10/2021 and left-sided deficits not noted previously, repeat MRI brain negative for new or acute findings, no recurrent headache. Today c/o slight worsening of UE tremor and recurrent epsiode of lightheadedness yesterday.     PLAN:  Right PCA strokes:  Hx of prior strokes Residual deficit: Occasional word mixup, peripheral vision impairment and mild cognitive impairment- stable, continue to follow with ophthalmology. Suspect LUE and left facial weakness in setting of prior  strokes - stable since prior visit Continue aspirin 81 mg daily and clopidogrel 75 mg daily  and Crestor per cardiology recommendations and for secondary stroke prevention managed/prescribed by PCP/cardiology Discussed secondary stroke prevention measures and importance of close PCP follow up for aggressive stroke risk factor management including BP goal<130/90, and HLD with LDL goal<70 I have gone over the pathophysiology of stroke, warning signs and symptoms, risk factors and their management in some detail with instructions to go to the closest emergency room for symptoms of concern.  Hx of seizure: Mood stabilization: No seizure activity since 2020 Further reduce Depakote from 250 to 125mg  nightly due to potential side effects with daytime fatigue and tremor.  Does have increased risk of seizure due to prior stroke history and advised to continue to monitor.  Patient/wife hesitant to completely discontinue as medication has had some benefit for mood.  Prior difficulty tolerating multiple ASMs including levetiracetam, lacosamide and phenytoin Continue gabapentin 300mg  BID for now but may consider decreasing dosage in the future Valproic acid level 72 (06/2022) Hx of elevated ammonia now normal  Tremor Possible benign essential tremor vs medication side effect Reducing Depakote dosage as noted above Not overly bothersome to patient, continue to monitor   Episodic lightheadedness Suspect multifactoral Discussed ensuring adequate water intake with at least 64 ounces per day as well as adequate nutritional intake Encouraged monitoring BP with any recurrent episodes If episodes persist, would recommend further discussion with PCP/cardiology     Follow up in 6 months or call earlier if needed    CC:  PCP: Benita Stabile, MD   [facility] provider: Dr. Pearlean Brownie  I spent 30 minutes of face-to-face and non-face-to-face time with patient and wife.  This included previsit chart review, lab review,  study review, order entry, electronic health record documentation, patient and wife education and discussion regarding above diagnoses and treatment plan and answered all the questions to patient and wife satisfaction  Ihor Austin, AGNP-BC  [facility] [address] [place], [place] [address]  Phone [phone] Fax [phone] Note: This document was prepared with digital dictation and possible smart phrase technology. Any transcriptional errors that result from this process are unintentional.

## 2023-04-03 ENCOUNTER — Ambulatory Visit: Payer: PPO | Admitting: Adult Health

## 2023-04-03 ENCOUNTER — Encounter: Payer: Self-pay | Admitting: Adult Health

## 2023-04-03 VITALS — BP 139/70 | HR 67 | Ht 67.0 in | Wt 205.0 lb

## 2023-04-03 DIAGNOSIS — I639 Cerebral infarction, unspecified: Secondary | ICD-10-CM | POA: Diagnosis not present

## 2023-04-03 DIAGNOSIS — R251 Tremor, unspecified: Secondary | ICD-10-CM

## 2023-04-03 DIAGNOSIS — R42 Dizziness and giddiness: Secondary | ICD-10-CM | POA: Diagnosis not present

## 2023-04-03 DIAGNOSIS — R569 Unspecified convulsions: Secondary | ICD-10-CM

## 2023-04-03 MED ORDER — DIVALPROEX SODIUM 125 MG PO DR TAB: 125.0000 mg | DELAYED_RELEASE_TABLET | Freq: Every day | ORAL | 5 refills | Status: DC

## 2023-04-03 NOTE — Patient Instructions (Signed)
Reduce Depakote down to 125mg  nightly for seizure prevention  - please monitor for any seizures and call office if concerned  Monitor tremors for now - please call with any worsening   Continue aspirin 81 mg daily and clopidogrel 75 mg daily  and Crestor for secondary stroke prevention  Continue to follow up with PCP regarding blood pressure and cholesterol management  Maintain strict control of hypertension with blood pressure goal below 130/90 and cholesterol with LDL cholesterol (bad cholesterol) goal below 70 mg/dL.   Signs of a Stroke? Follow the BEFAST method:  Balance Watch for a sudden loss of balance, trouble with coordination or vertigo Eyes Is there a sudden loss of vision in one or both eyes? Or double vision?  Face: Ask the person to smile. Does one side of the face droop or is it numb?  Arms: Ask the person to raise both arms. Does one arm drift downward? Is there weakness or numbness of a leg? Speech: Ask the person to repeat a simple phrase. Does the speech sound slurred/strange? Is the person confused ? Time: If you observe any of these signs, call 911.    Followup in the future with me in 6 months or call earlier if needed       Thank you for coming to see [place] at [facility]. I hope we have been able to provide you high quality care today.  You may receive a patient satisfaction survey over the next few weeks. We would appreciate your feedback and comments so that we may continue to improve ourselves and the health of our patients.

## 2023-05-22 DIAGNOSIS — Z713 Dietary counseling and surveillance: Secondary | ICD-10-CM | POA: Diagnosis not present

## 2023-05-22 DIAGNOSIS — E669 Obesity, unspecified: Secondary | ICD-10-CM | POA: Diagnosis not present

## 2023-05-22 DIAGNOSIS — Z6834 Body mass index (BMI) 34.0-34.9, adult: Secondary | ICD-10-CM | POA: Diagnosis not present

## 2023-05-22 DIAGNOSIS — Z8673 Personal history of transient ischemic attack (TIA), and cerebral infarction without residual deficits: Secondary | ICD-10-CM | POA: Diagnosis not present

## 2023-05-22 DIAGNOSIS — G40909 Epilepsy, unspecified, not intractable, without status epilepticus: Secondary | ICD-10-CM | POA: Diagnosis not present

## 2023-05-22 DIAGNOSIS — J302 Other seasonal allergic rhinitis: Secondary | ICD-10-CM | POA: Diagnosis not present

## 2023-05-22 DIAGNOSIS — Z7982 Long term (current) use of aspirin: Secondary | ICD-10-CM | POA: Diagnosis not present

## 2023-05-22 DIAGNOSIS — I639 Cerebral infarction, unspecified: Secondary | ICD-10-CM | POA: Diagnosis not present

## 2023-05-22 DIAGNOSIS — I129 Hypertensive chronic kidney disease with stage 1 through stage 4 chronic kidney disease, or unspecified chronic kidney disease: Secondary | ICD-10-CM | POA: Diagnosis not present

## 2023-05-22 DIAGNOSIS — N189 Chronic kidney disease, unspecified: Secondary | ICD-10-CM | POA: Diagnosis not present

## 2023-05-22 DIAGNOSIS — Z87891 Personal history of nicotine dependence: Secondary | ICD-10-CM | POA: Diagnosis not present

## 2023-05-22 DIAGNOSIS — Z79899 Other long term (current) drug therapy: Secondary | ICD-10-CM | POA: Diagnosis not present

## 2023-06-01 ENCOUNTER — Emergency Department (HOSPITAL_COMMUNITY): Payer: PPO

## 2023-06-01 ENCOUNTER — Other Ambulatory Visit: Payer: Self-pay

## 2023-06-01 ENCOUNTER — Encounter (HOSPITAL_COMMUNITY): Payer: Self-pay | Admitting: Emergency Medicine

## 2023-06-01 ENCOUNTER — Emergency Department (HOSPITAL_COMMUNITY)
Admission: EM | Admit: 2023-06-01 | Discharge: 2023-06-01 | Disposition: A | Discharge status: Home / Self Care | Payer: PPO | Attending: Emergency Medicine | Admitting: Emergency Medicine

## 2023-06-01 DIAGNOSIS — Z79899 Other long term (current) drug therapy: Secondary | ICD-10-CM | POA: Insufficient documentation

## 2023-06-01 DIAGNOSIS — G9389 Other specified disorders of brain: Secondary | ICD-10-CM | POA: Diagnosis not present

## 2023-06-01 DIAGNOSIS — I6782 Cerebral ischemia: Secondary | ICD-10-CM | POA: Diagnosis not present

## 2023-06-01 DIAGNOSIS — I700 Atherosclerosis of aorta: Secondary | ICD-10-CM | POA: Insufficient documentation

## 2023-06-01 DIAGNOSIS — H43811 Vitreous degeneration, right eye: Secondary | ICD-10-CM | POA: Diagnosis not present

## 2023-06-01 DIAGNOSIS — M47814 Spondylosis without myelopathy or radiculopathy, thoracic region: Secondary | ICD-10-CM | POA: Diagnosis not present

## 2023-06-01 DIAGNOSIS — Z7982 Long term (current) use of aspirin: Secondary | ICD-10-CM | POA: Insufficient documentation

## 2023-06-01 DIAGNOSIS — Z8673 Personal history of transient ischemic attack (TIA), and cerebral infarction without residual deficits: Secondary | ICD-10-CM | POA: Insufficient documentation

## 2023-06-01 DIAGNOSIS — I959 Hypotension, unspecified: Secondary | ICD-10-CM | POA: Diagnosis not present

## 2023-06-01 DIAGNOSIS — I2510 Atherosclerotic heart disease of native coronary artery without angina pectoris: Secondary | ICD-10-CM | POA: Insufficient documentation

## 2023-06-01 DIAGNOSIS — Z7902 Long term (current) use of antithrombotics/antiplatelets: Secondary | ICD-10-CM | POA: Insufficient documentation

## 2023-06-01 DIAGNOSIS — R42 Dizziness and giddiness: Secondary | ICD-10-CM | POA: Diagnosis not present

## 2023-06-01 DIAGNOSIS — R531 Weakness: Secondary | ICD-10-CM | POA: Insufficient documentation

## 2023-06-01 DIAGNOSIS — H401121 Primary open-angle glaucoma, left eye, mild stage: Secondary | ICD-10-CM | POA: Diagnosis not present

## 2023-06-01 DIAGNOSIS — H04223 Epiphora due to insufficient drainage, bilateral lacrimal glands: Secondary | ICD-10-CM | POA: Diagnosis not present

## 2023-06-01 DIAGNOSIS — Z961 Presence of intraocular lens: Secondary | ICD-10-CM | POA: Diagnosis not present

## 2023-06-01 DIAGNOSIS — H40021 Open angle with borderline findings, high risk, right eye: Secondary | ICD-10-CM | POA: Diagnosis not present

## 2023-06-01 LAB — TROPONIN I (HIGH SENSITIVITY)
Troponin I (High Sensitivity): 6 ng/L (ref <18)
Troponin I (High Sensitivity): 6 ng/L (ref <18)

## 2023-06-01 LAB — COMPREHENSIVE METABOLIC PANEL
ALT: 14 U/L (ref 0–44)
AST: 15 U/L (ref 15–41)
Albumin: 3.4 g/dL — ABNORMAL LOW (ref 3.5–5.0)
Alkaline Phosphatase: 48 U/L (ref 38–126)
Anion gap: 7 (ref 5–15)
BUN: 20 mg/dL (ref 8–23)
CO2: 24 mmol/L (ref 22–32)
Calcium: 8.8 mg/dL — ABNORMAL LOW (ref 8.9–10.3)
Chloride: 105 mmol/L (ref 98–111)
Creatinine, Ser: 1.43 mg/dL — ABNORMAL HIGH (ref 0.61–1.24)
GFR, Estimated: 50 mL/min — ABNORMAL LOW (ref >60)
Glucose, Bld: 123 mg/dL — ABNORMAL HIGH (ref 70–99)
Potassium: 4.2 mmol/L (ref 3.5–5.1)
Sodium: 136 mmol/L (ref 135–145)
Total Bilirubin: 0.5 mg/dL (ref 0.0–1.2)
Total Protein: 6 g/dL — ABNORMAL LOW (ref 6.5–8.1)

## 2023-06-01 LAB — CBC WITH DIFFERENTIAL/PLATELET
Abs Immature Granulocytes: 0.01 10*3/uL (ref 0.00–0.07)
Basophils Absolute: 0 10*3/uL (ref 0.0–0.1)
Basophils Relative: 1 %
Eosinophils Absolute: 0.2 10*3/uL (ref 0.0–0.5)
Eosinophils Relative: 4 %
HCT: 40.9 % (ref 39.0–52.0)
Hemoglobin: 13.6 g/dL (ref 13.0–17.0)
Immature Granulocytes: 0 %
Lymphocytes Relative: 21 %
Lymphs Abs: 1.1 10*3/uL (ref 0.7–4.0)
MCH: 29.6 pg (ref 26.0–34.0)
MCHC: 33.3 g/dL (ref 30.0–36.0)
MCV: 89.1 fL (ref 80.0–100.0)
Monocytes Absolute: 0.4 10*3/uL (ref 0.1–1.0)
Monocytes Relative: 8 %
Neutro Abs: 3.5 10*3/uL (ref 1.7–7.7)
Neutrophils Relative %: 66 %
Platelets: 190 10*3/uL (ref 150–400)
RBC: 4.59 MIL/uL (ref 4.22–5.81)
RDW: 14 % (ref 11.5–15.5)
WBC: 5.3 10*3/uL (ref 4.0–10.5)
nRBC: 0 % (ref 0.0–0.2)

## 2023-06-01 MED ORDER — SODIUM CHLORIDE 0.9 % IV BOLUS
1000.0000 mL | Freq: Once | INTRAVENOUS | Status: AC
  Administered 2023-06-01: 1000 mL via INTRAVENOUS

## 2023-06-01 NOTE — Discharge Instructions (Signed)
 Cut your losartan blood pressure medicine in half and only take a half a pill in the morning.  Follow-up with your family doctor next week to have your blood pressure rechecked

## 2023-06-01 NOTE — ED Triage Notes (Addendum)
 Pt  arrived by [facility]. Pt sent from his eye doctor for chest pains. Pt denies chest pain. EMS states pt was hypotensive with a systolic in the 90s and was pale when they arrived. Pt states this all started after he had his eyes dilated.

## 2023-06-01 NOTE — ED Provider Notes (Signed)
 [place] EMERGENCY DEPARTMENT AT [facility] Provider Note   CSN: [id number] Arrival date & time: 06/01/23  9053     History  Chief Complaint  Patient presents with   Hypotension    Jeremy [name] is a 78 y.o. male.  Patient has history of coronary artery disease and he was at the eye doctor today and felt weak and his blood pressure was low  The history is provided by the patient and medical records. No language interpreter was used.  Weakness Severity:  Moderate Onset quality:  Sudden Timing:  Intermittent Progression:  Waxing and waning Chronicity:  New Context: not alcohol  use   Relieved by:  Nothing Ineffective treatments:  None tried Associated symptoms: no abdominal pain, no chest pain, no cough, no diarrhea, no frequency, no headaches and no seizures        Home Medications Prior to Admission medications   Medication Sig Start Date End Date Taking? Authorizing Provider  acetaminophen  (TYLENOL ) 500 MG tablet Take 500 mg by mouth every 6 (six) hours as needed for mild pain.    [provider]  aspirin  EC 81 MG EC tablet Take 1 tablet (81 mg total) by mouth daily. 03/28/19   Furth, Cadence H, PA-C  carvedilol  (COREG ) 12.5 MG tablet TAKE 1 TABLET BY MOUTH TWICE DAILY WITH A MEAL 03/23/23   Nishan, Peter C, MD  clopidogrel  (PLAVIX ) 75 MG tablet Take 1 tablet by mouth once daily 12/28/22   Nishan, Peter C, MD  diltiazem  2 % GEL Apply 1 Application topically 2 (two) times daily. 06/14/22   Abran Norleen SAILOR, MD  divalproex  (DEPAKOTE ) 125 MG DR tablet Take 1 tablet (125 mg total) by mouth at bedtime. 04/03/23   Whitfield Raisin, NP  gabapentin  (NEURONTIN ) 300 MG capsule Take 1 capsule (300 mg total) by mouth 3 (three) times daily. 08/21/18   Sethi, Pramod S, MD  isosorbide  mononitrate (IMDUR ) 30 MG 24 hr tablet Take 1 tablet by mouth once daily 08/01/22   Nishan, Peter C, MD  losartan  (COZAAR ) 50 MG tablet Take 1 tablet by mouth once daily 12/12/22   Nishan, Peter  C, MD  nitroGLYCERIN  (NITROSTAT ) 0.4 MG SL tablet Place 1 tablet (0.4 mg total) under the tongue every 5 (five) minutes as needed for chest pain. 10/05/20   Delford Maude BROCKS, MD  oxyCODONE  (ROXICODONE ) 15 MG immediate release tablet Take 15 mg by mouth every 6 (six) hours as needed for pain. 10/18/19   [provider]  pantoprazole  (PROTONIX ) 40 MG tablet Take 1 tablet (40 mg total) by mouth daily. 01/14/22   Dunn, Dayna N, PA-C  ranolazine  (RANEXA ) 500 MG 12 hr tablet Take 1 tablet (500 mg total) by mouth 2 (two) times daily. 10/24/22   Delford Maude BROCKS, MD  RESTASIS  MULTIDOSE 0.05 % ophthalmic emulsion Place 1 drop into both eyes 2 (two) times daily as needed (dry/irritated eyes.). 09/22/21   [provider]  rosuvastatin  (CRESTOR ) 40 MG tablet Take 40 mg by mouth at bedtime. 10/30/19   [provider]  XIIDRA  5 % SOLN Place 1 drop into both eyes daily as needed (dry eye). 07/08/21   [provider]      Allergies    Codeine, Oxycontin  [oxycodone  hcl], Contrast media [iodinated contrast media], and Morphine  and codeine    Review of Systems   Review of Systems  Constitutional:  Negative for appetite change and fatigue.  HENT:  Negative for congestion, ear discharge and sinus pressure.  Eyes:  Negative for discharge.  Respiratory:  Negative for cough.   Cardiovascular:  Negative for chest pain.  Gastrointestinal:  Negative for abdominal pain and diarrhea.  Genitourinary:  Negative for frequency and hematuria.  Musculoskeletal:  Negative for back pain.  Skin:  Negative for rash.  Neurological:  Positive for weakness. Negative for seizures and headaches.  Psychiatric/Behavioral:  Negative for hallucinations.     Physical Exam Updated Vital Signs BP (!) 132/57   Pulse (!) 58   Temp 97.7 F (36.5 C) (Oral)   Resp 10   Ht 5' 7 (1.702 m)   Wt 93 kg   SpO2 99%   BMI 32.11 kg/m  Physical Exam Vitals and nursing note reviewed.  Constitutional:       Appearance: He is well-developed.  HENT:     Head: Normocephalic.     Nose: Nose normal.  Eyes:     General: No scleral icterus.    Conjunctiva/sclera: Conjunctivae normal.  Neck:     Thyroid : No thyromegaly.  Cardiovascular:     Rate and Rhythm: Normal rate and regular rhythm.     Heart sounds: No murmur heard.    No friction rub. No gallop.  Pulmonary:     Breath sounds: No stridor. No wheezing or rales.  Chest:     Chest wall: No tenderness.  Abdominal:     General: There is no distension.     Tenderness: There is no abdominal tenderness. There is no rebound.  Musculoskeletal:        General: Normal range of motion.     Cervical back: Neck supple.  Lymphadenopathy:     Cervical: No cervical adenopathy.  Skin:    Findings: No erythema or rash.  Neurological:     Mental Status: He is oriented to person, place, and time.     Motor: No abnormal muscle tone.     Coordination: Coordination normal.  Psychiatric:        Behavior: Behavior normal.     ED Results / Procedures / Treatments   Labs (all labs ordered are listed, but only abnormal results are displayed) Labs Reviewed  COMPREHENSIVE METABOLIC PANEL - Abnormal; Notable for the following components:      Result Value   Glucose, Bld 123 (*)    Creatinine, Ser 1.43 (*)    Calcium  8.8 (*)    Total Protein 6.0 (*)    Albumin 3.4 (*)    GFR, Estimated 50 (*)    All other components within normal limits  CBC WITH DIFFERENTIAL/PLATELET  TROPONIN I (HIGH SENSITIVITY)  TROPONIN I (HIGH SENSITIVITY)    EKG None  Radiology CT Head Wo Contrast Result Date: 06/01/2023 CLINICAL DATA:  No renal os. EXAM: CT HEAD WITHOUT CONTRAST TECHNIQUE: Contiguous axial images were obtained from the base of the skull through the vertex without intravenous contrast. RADIATION DOSE REDUCTION: This exam was performed according to the departmental dose-optimization program which includes automated exposure control, adjustment of the mA  and/or kV according to patient size and/or use of iterative reconstruction technique. COMPARISON:  Brain MRI dated 01/02/2023. FINDINGS: Brain: Mild age-related atrophy and chronic microvascular ischemic changes. 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 There is no acute intracranial hemorrhage. No mass effect or midline shift. No extra-axial fluid collection. Vascular: No hyperdense vessel or unexpected calcification. Skull: Normal. Negative for fracture or focal lesion. Sinuses/Orbits: No acute finding. Other: None IMPRESSION: 1. No acute intracranial pathology. 2. Mild age-related atrophy and chronic  microvascular ischemic changes. 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 Electronically Signed   By: Vanetta Chou M.D.   On: 06/01/2023 13:31   DG Chest Port 1 View Result Date: 06/01/2023 CLINICAL DATA:  Weakness EXAM: PORTABLE CHEST 1 VIEW COMPARISON:  11/13/2021 FINDINGS: Atherosclerotic calcification of the aortic arch. Thoracic spondylosis. Advanced degenerative glenohumeral arthropathy bilaterally. The lungs appear clear. No blunting of the costophrenic angles. Heart size within normal limits. IMPRESSION: 1. No acute findings. 2. Thoracic spondylosis. 3. Advanced degenerative glenohumeral arthropathy bilaterally. 4.  Aortic Atherosclerosis (ICD10-I70.0). Electronically Signed   By: Ryan Salvage M.D.   On: 06/01/2023 10:27    Procedures Procedures    Medications Ordered in ED Medications  sodium chloride  0.9 % bolus 1,000 mL (1,000 mLs Intravenous New Bag/Given 06/01/23 1021)    ED Course/ Medical Decision Making/ A&P                                 Medical Decision Making Amount and/or Complexity of Data Reviewed Labs: ordered. Radiology: ordered.  Patient with hypotension that has resolved.  We will decrease his losartan  so he is only taking 25 mg a day and he will follow-up with his PCP        Final Clinical Impression(s) / ED  Diagnoses Final diagnoses:  Hypotension, unspecified hypotension type    Rx / DC Orders ED Discharge Orders     None         Suzette Pac, MD 06/05/23 1207

## 2023-06-09 ENCOUNTER — Telehealth: Payer: Self-pay

## 2023-06-09 NOTE — Progress Notes (Signed)
Transition Care Management Follow-up Telephone Call Date of discharge and from where: Jeremy [name] 1/9 How have you been since you were released from the hospital? Patient has not followed up with provider Any questions or concerns? No  Items Reviewed: Did the pt receive and understand the discharge instructions provided? Yes  Medications obtained and verified? Yes  Other? No  Any new allergies since your discharge? No  Dietary orders reviewed? No Do you have support at home? Yes     PCP Hospital f/u appt confirmed? No  Scheduled to see  on  @ . Specialist Hospital f/u appt confirmed? No  Scheduled to see  on  @ . Are transportation arrangements needed? No  If their condition worsens, is the pt aware to call PCP or go to the Emergency Dept.? Yes Was the patient provided with contact information for the PCP's office or ED? Yes Was to pt encouraged to call back with questions or concerns? Yes

## 2023-06-16 ENCOUNTER — Telehealth: Payer: Self-pay | Admitting: Cardiovascular Disease

## 2023-06-16 NOTE — Telephone Encounter (Signed)
Pt c/o medication issue:  1. Name of Medication: Scopolamine Patch   2. How are you currently taking this medication (dosage and times per day)? Not taking yet  3. Are you having a reaction (difficulty breathing--STAT)?   4. What is your medication issue?    Patient's wife is requesting call back to discuss a patch to help with dry eye issues. States eye doctor will not prescribe this until he has the okay from cardiologist.

## 2023-06-16 NOTE — Telephone Encounter (Signed)
Spoke to wife, this will be for long term use advised long term use would be inappropriate in elderly. Scopolamine is on the [facility].

## 2023-07-11 DIAGNOSIS — G40909 Epilepsy, unspecified, not intractable, without status epilepticus: Secondary | ICD-10-CM | POA: Diagnosis not present

## 2023-07-11 DIAGNOSIS — J302 Other seasonal allergic rhinitis: Secondary | ICD-10-CM | POA: Diagnosis not present

## 2023-07-11 DIAGNOSIS — E782 Mixed hyperlipidemia: Secondary | ICD-10-CM | POA: Diagnosis not present

## 2023-07-11 DIAGNOSIS — G8929 Other chronic pain: Secondary | ICD-10-CM | POA: Diagnosis not present

## 2023-07-11 DIAGNOSIS — K649 Unspecified hemorrhoids: Secondary | ICD-10-CM | POA: Diagnosis not present

## 2023-07-11 DIAGNOSIS — I639 Cerebral infarction, unspecified: Secondary | ICD-10-CM | POA: Diagnosis not present

## 2023-07-11 DIAGNOSIS — E669 Obesity, unspecified: Secondary | ICD-10-CM | POA: Diagnosis not present

## 2023-07-11 DIAGNOSIS — I129 Hypertensive chronic kidney disease with stage 1 through stage 4 chronic kidney disease, or unspecified chronic kidney disease: Secondary | ICD-10-CM | POA: Diagnosis not present

## 2023-07-11 DIAGNOSIS — I959 Hypotension, unspecified: Secondary | ICD-10-CM | POA: Diagnosis not present

## 2023-07-11 DIAGNOSIS — Z125 Encounter for screening for malignant neoplasm of prostate: Secondary | ICD-10-CM | POA: Diagnosis not present

## 2023-08-23 ENCOUNTER — Encounter: Payer: Self-pay | Admitting: Family Medicine

## 2023-08-23 DIAGNOSIS — K649 Unspecified hemorrhoids: Secondary | ICD-10-CM | POA: Diagnosis not present

## 2023-08-23 DIAGNOSIS — I2510 Atherosclerotic heart disease of native coronary artery without angina pectoris: Secondary | ICD-10-CM | POA: Diagnosis not present

## 2023-08-23 DIAGNOSIS — G629 Polyneuropathy, unspecified: Secondary | ICD-10-CM | POA: Diagnosis not present

## 2023-08-23 DIAGNOSIS — N1831 Chronic kidney disease, stage 3a: Secondary | ICD-10-CM | POA: Diagnosis not present

## 2023-08-23 DIAGNOSIS — I639 Cerebral infarction, unspecified: Secondary | ICD-10-CM | POA: Diagnosis not present

## 2023-08-23 DIAGNOSIS — Z0001 Encounter for general adult medical examination with abnormal findings: Secondary | ICD-10-CM | POA: Diagnosis not present

## 2023-08-23 DIAGNOSIS — G40909 Epilepsy, unspecified, not intractable, without status epilepticus: Secondary | ICD-10-CM | POA: Diagnosis not present

## 2023-08-23 DIAGNOSIS — E782 Mixed hyperlipidemia: Secondary | ICD-10-CM | POA: Diagnosis not present

## 2023-08-23 DIAGNOSIS — G8929 Other chronic pain: Secondary | ICD-10-CM | POA: Diagnosis not present

## 2023-08-23 DIAGNOSIS — R7301 Impaired fasting glucose: Secondary | ICD-10-CM | POA: Diagnosis not present

## 2023-08-23 DIAGNOSIS — J302 Other seasonal allergic rhinitis: Secondary | ICD-10-CM | POA: Diagnosis not present

## 2023-08-23 DIAGNOSIS — I129 Hypertensive chronic kidney disease with stage 1 through stage 4 chronic kidney disease, or unspecified chronic kidney disease: Secondary | ICD-10-CM | POA: Diagnosis not present

## 2023-08-31 DIAGNOSIS — H40021 Open angle with borderline findings, high risk, right eye: Secondary | ICD-10-CM | POA: Diagnosis not present

## 2023-08-31 DIAGNOSIS — H04223 Epiphora due to insufficient drainage, bilateral lacrimal glands: Secondary | ICD-10-CM | POA: Diagnosis not present

## 2023-08-31 DIAGNOSIS — H43811 Vitreous degeneration, right eye: Secondary | ICD-10-CM | POA: Diagnosis not present

## 2023-08-31 DIAGNOSIS — H401121 Primary open-angle glaucoma, left eye, mild stage: Secondary | ICD-10-CM | POA: Diagnosis not present

## 2023-09-11 ENCOUNTER — Telehealth: Payer: Self-pay

## 2023-09-11 NOTE — Progress Notes (Signed)
   09/11/2023  Patient ID: Jeremy [name], male   DOB: 19-Mar-1946, 78 y.o.   MRN: [id number]   Patient appeared on insurance report for not passing the quality metrics in 2024:  Medication Adherence for Cholesterol (MAC)   Outreach to the patient was not needed today.  Meds Tracking:  -Losartan  50 mg - Last fill 90DS on 06/13/23, BP 140/80 on 08/23/23, per notes home BP is 120-130/80s. Does not qualify for metric yet, BP managed by cardiology, next fill due 09/11/23.  -Rosuvastatin  40 mg - Last fill 90DS on 08/24/23, LDL 61 on 07/11/23. Does not qualify for metric yet this year, next fill due 11/22/23.   Plan:  Due for Losartan  refill today, will check fill history later this week to confirm fill. Continue to monitor adherence and will review notes on 11/22/23, if BP not controlled will reach out to discuss.   Flint Hummer, PharmD

## 2023-09-15 ENCOUNTER — Telehealth: Payer: Self-pay

## 2023-09-15 NOTE — Progress Notes (Signed)
   09/15/2023  Patient ID: Jeremy [name], male   DOB: 1945/10/28, 78 y.o.   MRN: [id number]   Patient appeared on insurance report for not passing the quality metrics in 2024:  Medication Adherence for Cholesterol (MAC)   Outreach to the patient was successful. Spoke with [name], she checks BP and manages medications for him.  Meds Tracking:  -Losartan  50 mg - Last fill 90DS on 06/13/23, BP 140/80 on 08/23/23, per wife his BP has been consistently 150s/80s at home. Does not qualify for metric yet, BP managed by cardiology, next fill overdue.  -Rosuvastatin  40 mg - Last fill 90DS on 08/24/23, LDL 61 on 07/11/23. Does not qualify for metric yet this year, next fill due 11/22/23.   Plan:  Due for Losartan  refill Jeremy [name] knew this was due and will go pick it up at the pharmacy. Advised her to check on isosorbide  mononitrate ER 30 mg as well since this appears overdue. Will continue to monitor adherence, instructed her to check his BP daily over the next two weeks, keep a log, and we will have a phone visit to review his log. Will need to consult with cardiology if making changes to meds.   Flint Hummer, PharmD

## 2023-10-09 ENCOUNTER — Encounter: Payer: Self-pay | Admitting: Adult Health

## 2023-10-09 ENCOUNTER — Ambulatory Visit: Payer: PPO | Admitting: Adult Health

## 2023-10-09 VITALS — BP 158/82 | HR 74 | Ht 68.0 in | Wt 202.0 lb

## 2023-10-09 DIAGNOSIS — I639 Cerebral infarction, unspecified: Secondary | ICD-10-CM

## 2023-10-09 DIAGNOSIS — R251 Tremor, unspecified: Secondary | ICD-10-CM | POA: Diagnosis not present

## 2023-10-09 DIAGNOSIS — R569 Unspecified convulsions: Secondary | ICD-10-CM

## 2023-10-09 MED ORDER — DIVALPROEX SODIUM 125 MG PO DR TAB: 125.0000 mg | DELAYED_RELEASE_TABLET | Freq: Every day | ORAL | 3 refills | Status: AC

## 2023-10-09 NOTE — Patient Instructions (Signed)
 Continue Depakote  125 mg nightly -refill sent to your pharmacy  Continue aspirin  81 mg daily and clopidogrel  75 mg daily  and Crestor   for secondary stroke prevention  Continue to follow up with PCP regarding blood pressure and cholesterol management  Maintain strict control of hypertension with blood pressure goal below 130/90 and cholesterol with LDL cholesterol (bad cholesterol) goal below 70 mg/dL.   Signs of a Stroke? Follow the BEFAST method:  Balance Watch for a sudden loss of balance, trouble with coordination or vertigo Eyes Is there a sudden loss of vision in one or both eyes? Or double vision?  Face: Ask the person to smile. Does one side of the face droop or is it numb?  Arms: Ask the person to raise both arms. Does one arm drift downward? Is there weakness or numbness of a leg? Speech: Ask the person to repeat a simple phrase. Does the speech sound slurred/strange? Is the person confused ? Time: If you observe any of these signs, call 911.     Followup in the future with me in 7 months or call earlier if needed       Thank you for coming to see us  at [facility]. I hope we have been able to provide you high quality care today.  You may receive a patient satisfaction survey over the next few weeks. We would appreciate your feedback and comments so that we may continue to improve ourselves and the health of our patients.

## 2023-10-09 NOTE — Progress Notes (Signed)
 [facility] [address] [place]. [place] [address] [phone]       STROKE FOLLOW UP NOTE  [name] Date of Birth:  11/17/1945 Medical Record Number:  [id number]   Reason for visit: Stroke follow-up    SUBJECTIVE:   CHIEF COMPLAINT:  Chief Complaint  Patient presents with   Follow-up    Pt with wife, rm 8. States overall he has remained stable. Denies any issues or concerns,     HPI:    Update 10/09/2023 JM: Patient returns for follow-up visit accompanied by his wife.  Reports overall doing well since prior visit.  Previously decreased Depakote  dose from 250 to 125 nightly, feels his fatigue improved and not sleeping as much, no significant change tremor, still not bothersome.  Also continues on gabapentin  300 mg twice daily.  Denies any seizure activity or any recurrent headaches.  No new stroke/TIA symptoms.  Continued left peripheral visual impairment and occasional word finding difficulty which has been stable.  Remains on DAPT and Crestor  without side effects.  Routinely follows with PCP and cardiology.  No questions or concerns at this time.    History provided for reference purposes only Update 04/03/2023 JM: Patient returns for follow-up visit accompanied by his wife.  Patient reports overall he has been doing well since prior visit.  He did have an episode yesterday during church where he started to feel lightheaded, this lasted for about 2 hours, went home after church and took a nap. This is not new, occurs intermittently.  Does admit to poor water  intake, did not eat that morning, ate pizza prior to leaving church with some improvement of symptoms.  No other associated symptoms.  Wife believes tremor slightly worse, not noticeable to the patient.  No recurrent headaches. MRI brain did not show any new strokes, showed expected evolution of prior infarcts.  Denies any seizure activity.  Previously reduced Depakote  dosage to 250 mg nightly due  to excessive daytime fatigue with noted improvement. Mood has been stable. He questions further reducing dosage.  Denies new stroke/TIA symptoms.  Compliant on aspirin , Plavix  and Crestor .  Routinely follows with PCP and cardiology.  No further questions or concerns at this time.  Update 12/06/2022 JM: Patient returns for routine follow-up via his wife.patient mentions about 1 month ago, he had a severe occipital headache that was debilitating, denies any neck pain and radiating symptoms.  Per wife he just looked bad and had a "distant look". He spent the day in bed. She tried to encourage him to go to the emergency room but he declined. Denies any new extremity or facial weakness, speech changes, cognitive changes or vision changes during that time. He does feel like his balance has been worse since that time feeling more unsteady especially on stairs and per wife he has been more moody. Ambulates without assistive device, denies any recent falls.  Continues to have memory difficulties but not necessarily worsened since that time.  He has had 1 or 2 mild headaches since that time but none over the past couple of weeks.  Continued poststroke peripheral visual loss and occasional word finding difficulties without change.  Routinely follows with ophthalmology with stable visual exam.  At prior visit, concern of excessive fatigue and LUE tremor, reduced Depakote  dosage down to 500mg  nightly which helped some where he isn't sleeping as much during the day and tremor only on occasion but still has daytime fatigue.  He also continues on gabapentin  300 mg  twice daily which was started back in 2020 per wife for seizure prevention.  His daytime fatigue complaints has been more severe/significant after his stroke in 2023. Remains on aspirin , Plavix  and Crestor .  Routinely follows with PCP for stroke risk factor management and routinely follows with cardiology.  Update 07/05/2021 JM: Patient returns for 55-month follow-up  accompanied by his wife.  Stable since prior visit, denies new stroke/TIA symptoms.  Peripheral visual loss unchanged since prior visit and occasional word mix up per wife, essentially unchanged from prior visit.  Compliant on aspirin , Plavix  and Crestor .  Blood pressure well-controlled.  Routinely follows with PCP and cardiology.  Wife concerned that he is staying in bed more, was present prior to his stroke but has been gradually worsening. Will wake up not feeling well in the morning and will shortly go back to bed or will continue to become fatigued after little activity. Cardiology questioned ongoing need of Depakote  and to see if this was possibly contributing which he has remained on for mood stabilization since his stroke and history of seizures.  Prior Depakote  level 39.  Wife also mentions onset of LUE tremor, started shortly after prior visit, can interfere with activity/functioning and at times will drop items. Denies any pain in hand or sensation of weakness.   Initial visit 01/06/2022 JM: Patient is being seen for initial hospital follow-up accompanied by his wife.  Reports doing well since discharge. Reports he was seen by ophthalmologist since discharge, was told he has some peripheral visual loss but is not noticeable by patient.  He has since returned back to all prior activities as well as driving without difficulty.  Wife does mention that he will occasionally mix up words or say the wrong word which has been present since his recent stroke.  This is fairly unchanged since discharge.  Denies any new stroke/TIA symptoms. He does note increased fatigue since discharge.  Does admit to significant snoring and per wife, ICU nurses reported witnessed apneas.  Reports she sleeps well throughout the night without any difficulty.  He has not previously underwent sleep study.  Remains on Depakote  DR 500 mg twice daily, denies side effects. He has not had any issues with outbursts or agitation.  No  seizure activity.  Recent Depakote  level 39. Repeat ammonia level now WNL.   Remains on both aspirin  and Plavix  and Crestor , denies side effects Blood pressure today 159/66 - monitors at home and typically 120-130s  He has since had follow-up with cardiology and PCP  No further concerns at this time   Stroke admission 11/13/2021 Mr. QUINTAN SALDIVAR is a 78 y.o. male with history of right CEA 2019, CAD, hypertension, hyperlipidemia, history of seizure and stroke 05/2018 who was initially admitted on 11/13/2021 for intermittent chest pain and subsequently underwent cardiac cath and stenting.  Post cardiac cath on 6/26 patient was found to have confusion, difficulty speaking, somnolence and mild left arm drift.  Neurology was consulted with MRI showing right PCA, cerebellar and punctate frontal parietal infarcts embolic likely related to cardiac cath procedure below unable to completely rule out A-fib although not seen on telemetry, Dr. Arlester Ladd did not feel monitor warranted at this time.  Carotid Doppler unremarkable.  EF 60 to 65%.  EEG moderate diffuse encephalopathy.  LDL 59.  A1c 5.4.  On aspirin  and Plavix  per cardiology recommendations as well as Crestor  40 mg daily.  Prior history of seizure in 05/2018 but discontinued AEDs d/t cognitive side effects including Dilantin , Keppra   and Vimpat  at prior visit with Dr. Janett Medin in 07/2018.  Neurology recommended starting Depakote  for mood stabilization (combativeness and agitation during admission) and prior history of seizure.  Also noted low B12 levels at 192 and elevated ammonia at 41.  Recommended repeat labs at follow-up visits.  Residual deficits of left upper quadrantanopsia and advised no driving until neurology f/u. He was discharged home on 6/29.      PERTINENT IMAGING  Per hospitalization 11/15/2021 CT head no acute finding CT head and neck limited, nondiagnostic MRI right PCA, right cerebellum and punctate right frontal parietal infarcts Carotid  Doppler unremarkable 2D Echo EF 60 to 65% EEG moderate diffuse encephalopathy LDL 59 HgbA1c 5.4    ROS:   14 system review of systems performed and negative with exception of those listed in HP  PMH:  Past Medical History:  Diagnosis Date   Anxiety    Arthritis    Basal cell carcinoma 02/11/2021   nod- left dorsal hand (EXC)   CAD (coronary artery disease)    a. s/p prior stenting of LAD and RCA b. 03/2019: NSTEMI and required PTCA/DESx1 to the LAD and successful scoring balloon angioplasty in the previously stented segment of LCx   Carotid stenosis, asymptomatic, right    Carpal tunnel syndrome    bilateral   Coronary artery disease    GERD (gastroesophageal reflux disease)    History of kidney stones    Hx of colonic polyp    Hypercholesterolemia    Hypertension    Myocardial infarction (HCC)    hx of   Obesity    SCC (squamous cell carcinoma) 02/11/2021   in situ- right temple (CX35FU)   Squamous cell carcinoma of skin 08/09/2016   well differentiated on right outer eye - tx p bx   Stroke (HCC)     PSH:  Past Surgical History:  Procedure Laterality Date   CARDIAC CATHETERIZATION     CATARACT EXTRACTION W/PHACO Left 03/12/2021   Procedure: CATARACT EXTRACTION PHACO AND INTRAOCULAR LENS PLACEMENT (IOC);  Surgeon: Tarri Farm, MD;  Location: [facility];  Service: Ophthalmology;  Laterality: Left;   CDE: 14.58   CATARACT EXTRACTION W/PHACO Right 04/08/2021   Procedure: CATARACT EXTRACTION PHACO AND INTRAOCULAR LENS PLACEMENT RIGHT EYE;  Surgeon: Tarri Farm, MD;  Location: [facility];  Service: Ophthalmology;  Laterality: Right;  CDE 10.94   CORONARY BALLOON ANGIOPLASTY N/A 03/26/2019   Procedure: CORONARY BALLOON ANGIOPLASTY;  Surgeon: Odie Benne, MD;  Location: [facility];  Service: Cardiovascular;  Laterality: N/A;   CORONARY STENT INTERVENTION N/A 03/26/2019   Procedure: CORONARY STENT INTERVENTION;  Surgeon: Odie Benne, MD;   Location: [facility];  Service: Cardiovascular;  Laterality: N/A;   CORONARY STENT INTERVENTION N/A 11/15/2021   Procedure: CORONARY STENT INTERVENTION;  Surgeon: Avanell Leigh, MD;  Location: [facility];  Service: Cardiovascular;  Laterality: N/A;   CORONARY STENT PLACEMENT  2000   x3    CYST EXCISION     x 2   ENDARTERECTOMY Right 12/25/2017   Procedure: ENDARTERECTOMY CAROTID RIGHT;  Surgeon: Mayo Speck, MD;  Location: [facility];  Service: Vascular;  Laterality: Right;   LEFT HEART CATH AND CORONARY ANGIOGRAPHY N/A 03/26/2019   Procedure: LEFT HEART CATH AND CORONARY ANGIOGRAPHY;  Surgeon: Odie Benne, MD;  Location: [facility];  Service: Cardiovascular;  Laterality: N/A;   LEFT HEART CATH AND CORONARY ANGIOGRAPHY N/A 11/15/2021   Procedure: LEFT HEART CATH AND  CORONARY ANGIOGRAPHY;  Surgeon: Avanell Leigh, MD;  [facility] INV[facility] CV LAB;  Service: Cardiovascular;  Laterality: N/A;   PATCH ANGIOPLASTY Right 12/25/2017   Procedure: PATCH ANGIOPLASTY RIGHT CAROTID ARTERY;  Surgeon: Mayo Speck, MD;  Location: [facility];  Service: Vascular;  Laterality: Right;   TEAR DUCT PROBING Bilateral 07/2020   done at [facility]. Dr. Donnamae Gaba   TEE WITHOUT CARDIOVERSION N/A 06/29/2018   Procedure: TRANSESOPHAGEAL ECHOCARDIOGRAM (TEE);  Surgeon: Elmyra Haggard, MD;  Location: [facility] ENDOSCOPY;  Service: Cardiovascular;  Laterality: N/A;    Social History:  Social History   Socioeconomic History   Marital status: Married    Spouse name: Not on file   Number of children: 2   Years of education: Not on file   Highest education level: Not on file  Occupational History   Occupation: retired    Comment: welder  Tobacco Use   Smoking status: Former    Current packs/day: 0.00    Average packs/day: 2.0 packs/day for 20.0 years (40.0 ttl pk-yrs)    Types: Cigarettes    Start date: 05/23/1968    Quit date: 05/23/1988    Years since quitting: 35.4   Smokeless tobacco:  Never   Tobacco comments:    smoked 2 packs per day for 20 years  Vaping Use   Vaping status: Never Used  Substance and Sexual Activity   Alcohol  use: No   Drug use: No   Sexual activity: Yes  Other Topics Concern   Not on file  Social History Narrative   Not on file   Social Drivers of Health   Financial Resource Strain: Not on file  Food Insecurity: Not on file  Transportation Needs: Not on file  Physical Activity: Not on file  Stress: Not on file  Social Connections: Not on file  Intimate Partner Violence: Not on file    Family History:  Family History  Problem Relation Age of Onset   Coronary artery disease Mother 28       deceased   Hypertension Mother    Other Father 71       deceased old age   Other Sister 34       living and healthy   Coronary artery disease Brother    Skin cancer Brother    Colon cancer Neg Hx    Pancreatic cancer Neg Hx    Stomach cancer Neg Hx    Esophageal cancer Neg Hx    Liver disease Neg Hx     Medications:   Current Outpatient Medications on File Prior to Visit  Medication Sig Dispense Refill   acetaminophen  (TYLENOL ) 500 MG tablet Take 500 mg by mouth every 6 (six) hours as needed for mild pain.     aspirin  EC 81 MG EC tablet Take 1 tablet (81 mg total) by mouth daily. 90 tablet 3   carvedilol  (COREG ) 12.5 MG tablet TAKE 1 TABLET BY MOUTH TWICE DAILY WITH A MEAL 180 tablet 2   clopidogrel  (PLAVIX ) 75 MG tablet Take 1 tablet by mouth once daily 90 tablet 3   diltiazem  2 % GEL Apply 1 Application topically 2 (two) times daily. 30 g 2   divalproex  (DEPAKOTE ) 125 MG DR tablet Take 1 tablet (125 mg total) by mouth at bedtime. 30 tablet 5   gabapentin  (NEURONTIN ) 300 MG capsule Take 1 capsule (300 mg total) by mouth 3 (three) times daily. 90 capsule 6   isosorbide  mononitrate (IMDUR ) 30 MG 24 hr tablet  Take 1 tablet by mouth once daily 90 tablet 3   losartan  (COZAAR ) 50 MG tablet Take 1 tablet by mouth once daily 90 tablet 3    nitroGLYCERIN  (NITROSTAT ) 0.4 MG SL tablet Place 1 tablet (0.4 mg total) under the tongue every 5 (five) minutes as needed for chest pain. 25 tablet 3   oxyCODONE  (ROXICODONE ) 15 MG immediate release tablet Take 15 mg by mouth every 6 (six) hours as needed for pain.     pantoprazole  (PROTONIX ) 40 MG tablet Take 1 tablet (40 mg total) by mouth daily. 90 tablet 3   ranolazine  (RANEXA ) 500 MG 12 hr tablet Take 1 tablet (500 mg total) by mouth 2 (two) times daily. 60 tablet 11   rosuvastatin  (CRESTOR ) 40 MG tablet Take 40 mg by mouth at bedtime.     XIIDRA  5 % SOLN Place 1 drop into both eyes daily as needed (dry eye).     No current facility-administered medications on file prior to visit.    Allergies:   Allergies  Allergen Reactions   Codeine Anaphylaxis   Oxycontin  [Oxycodone  Hcl] Itching    Through IV- has since had oral oxycodone  without issue   Contrast Media [Iodinated Contrast Media]     Patient got very nauseated. Gave 4mg  Zofran . Possibly pre medicate with Zofran     Morphine  And Codeine Itching      OBJECTIVE:  Physical Exam  Vitals:   10/09/23 1454  BP: (!) 158/82  Pulse: 74  Weight: 202 lb (91.6 kg)  Height: 5\' 8"  (1.727 m)   Body mass index is 30.71 kg/m. No results found.  General: well developed, well nourished, pleasant elderly Caucasian male, seated, in no evident distress Head: head normocephalic and atraumatic.   Neck: supple with no carotid or supraclavicular bruits Cardiovascular: regular rate and rhythm, no murmurs Musculoskeletal: no deformity Skin:  no rash/petichiae Vascular:  Normal pulses all extremities   Neurologic Exam Mental Status: Awake and fully alert.  Some speech hesitancy, unable to appreciate aphasia or dysarthria.  Oriented to place and time. Recent memory impaired and remote memory intact. Attention span, concentration and fund of knowledge appropriate during visit. Mood and affect appropriate during visit.  Cranial Nerves: Pupils  equal, briskly reactive to light. Extraocular movements full without nystagmus. Visual fields partial left upper homonymous quadrantanopia.  Hearing intact. Facial sensation intact. tongue, palate moves normally and symmetrically. Left nasolabial fold flattening. Motor: Normal bulk and tone. Normal strength in all tested extremity muscles except slightly decreased left hand grip.  No significant tremor on exam today, possibly very slight tremor of outstretched arms R>L, no tremor in head/neck, no cogwheel rigidity or bradykinesia.  Does not have resting tremor. Sensory.: intact to touch , pinprick , position and vibratory sensation.  Coordination: Rapid alternating movements normal in all extremities except slightly decreased left hand. Finger-to-nose and heel-to-shin performed accurately bilaterally. Gait and Station: Arises from chair without difficulty. Stance is normal. Gait demonstrates wide-based gait with very mild imbalance without use of AD.  Able to tandem walk and heel toe with mild to moderate difficulty. Reflexes: 1+ and symmetric. Toes downgoing.         ASSESSMENT: Jeremy [name] is a 78 y.o. year old male with right PCA, cerebellar and punctate frontoparietal infarcts on 11/15/2021 likely related to cardiac cath procedure. Vascular risk factors include history of right CEA, history of seizure and stroke 2020, HTN, HLD, B12 deficiency, CAD, CHF and advanced age.  Prior concern of LUE tremor and  excessive daytime fatigue which improved some after lowering Depakote  dosage.  Complained of occipital headache 10/2021 and left-sided deficits not noted previously, repeat MRI brain negative for new or acute findings, no recurrent headache.     PLAN:  Right PCA strokes:  Hx of prior strokes Residual deficit: Occasional word mixup, peripheral vision impairment and mild cognitive impairment- stable, continue to follow with ophthalmology. Suspect LUE and left facial weakness in setting of prior  strokes  Continue aspirin  81 mg daily and clopidogrel  75 mg daily  and Crestor  per cardiology recommendations and for secondary stroke prevention managed/prescribed by PCP/cardiology Discussed secondary stroke prevention measures and importance of close PCP follow up for aggressive stroke risk factor management including BP goal<130/90, and HLD with LDL goal<70 I have gone over the pathophysiology of stroke, warning signs and symptoms, risk factors and their management in some detail with instructions to go to the closest emergency room for symptoms of concern.  Hx of seizure: Mood stabilization: No seizure activity since 2020 Continue Depakote  125mg  at bedtime -difficulty tolerating higher dose (fatigue, tremor).  Discussed potentially discontinuing with patient and wife wish to continue due to mood benefit. Prior difficulty tolerating multiple ASMs including levetiracetam , lacosamide  and phenytoin  Continue gabapentin  300mg  BID per PCP Routine labs by PCP which has been stable Hx of elevated ammonia now normal  Tremor Possible benign essential tremor vs medication side effect Not overly bothersome to patient, continue to monitor      Follow up in 7 months or call earlier if needed    CC:  PCP: Omie Bickers, MD    I spent 25 minutes of face-to-face and non-face-to-face time with patient and wife.  This included previsit chart review, lab review, study review, order entry, electronic health record documentation, patient and wife education and discussion regarding above diagnoses and treatment plan and answered all the questions to patient and wife satisfaction  Johny Nap, AGNP-BC  [facility] [address] [place], [place] [address]  Phone [phone] Fax [phone] Note: This document was prepared with digital dictation and possible smart phrase technology. Any transcriptional errors that result from this process are  unintentional.

## 2023-10-18 DIAGNOSIS — L988 Other specified disorders of the skin and subcutaneous tissue: Secondary | ICD-10-CM | POA: Diagnosis not present

## 2023-10-18 DIAGNOSIS — L821 Other seborrheic keratosis: Secondary | ICD-10-CM | POA: Diagnosis not present

## 2023-10-18 DIAGNOSIS — L578 Other skin changes due to chronic exposure to nonionizing radiation: Secondary | ICD-10-CM | POA: Diagnosis not present

## 2023-10-18 DIAGNOSIS — Z1283 Encounter for screening for malignant neoplasm of skin: Secondary | ICD-10-CM | POA: Diagnosis not present

## 2023-10-18 DIAGNOSIS — L738 Other specified follicular disorders: Secondary | ICD-10-CM | POA: Diagnosis not present

## 2023-10-18 DIAGNOSIS — L570 Actinic keratosis: Secondary | ICD-10-CM | POA: Diagnosis not present

## 2023-10-18 DIAGNOSIS — L218 Other seborrheic dermatitis: Secondary | ICD-10-CM | POA: Diagnosis not present

## 2023-10-18 DIAGNOSIS — D1801 Hemangioma of skin and subcutaneous tissue: Secondary | ICD-10-CM | POA: Diagnosis not present

## 2023-10-20 ENCOUNTER — Other Ambulatory Visit: Payer: Self-pay | Admitting: Cardiovascular Disease

## 2023-11-06 ENCOUNTER — Telehealth: Payer: Self-pay | Admitting: Cardiovascular Disease

## 2023-11-06 DIAGNOSIS — M545 Low back pain, unspecified: Secondary | ICD-10-CM | POA: Diagnosis not present

## 2023-11-06 DIAGNOSIS — I639 Cerebral infarction, unspecified: Secondary | ICD-10-CM | POA: Diagnosis not present

## 2023-11-06 DIAGNOSIS — N1831 Chronic kidney disease, stage 3a: Secondary | ICD-10-CM | POA: Diagnosis not present

## 2023-11-06 DIAGNOSIS — K219 Gastro-esophageal reflux disease without esophagitis: Secondary | ICD-10-CM | POA: Diagnosis not present

## 2023-11-06 DIAGNOSIS — E782 Mixed hyperlipidemia: Secondary | ICD-10-CM | POA: Diagnosis not present

## 2023-11-06 DIAGNOSIS — G629 Polyneuropathy, unspecified: Secondary | ICD-10-CM | POA: Diagnosis not present

## 2023-11-06 DIAGNOSIS — I2510 Atherosclerotic heart disease of native coronary artery without angina pectoris: Secondary | ICD-10-CM | POA: Diagnosis not present

## 2023-11-06 DIAGNOSIS — G40909 Epilepsy, unspecified, not intractable, without status epilepticus: Secondary | ICD-10-CM | POA: Diagnosis not present

## 2023-11-06 DIAGNOSIS — G8929 Other chronic pain: Secondary | ICD-10-CM | POA: Diagnosis not present

## 2023-11-06 DIAGNOSIS — I10 Essential (primary) hypertension: Secondary | ICD-10-CM | POA: Diagnosis not present

## 2023-11-06 DIAGNOSIS — N401 Enlarged prostate with lower urinary tract symptoms: Secondary | ICD-10-CM | POA: Diagnosis not present

## 2023-11-06 DIAGNOSIS — J302 Other seasonal allergic rhinitis: Secondary | ICD-10-CM | POA: Diagnosis not present

## 2023-11-06 NOTE — Telephone Encounter (Signed)
 Patient identification verified by 2 forms. Sims Duck, RN     Called and spoke to Chana Comas, NP with [employer].  Erin states:  - At patient's home visit pt's hr low 50's dizziness with change in position otherwise asymptomatic.  Patient also experiencing hypertension.  - patient reports BP at home 150-170s systolic  - Today 138/72 at [facility] office. Erin checked pt BP cuff against office cuff and only alight difference. Erin recommended patient monitor bp daily for the next week and send her the readings.  [place] wants to know if ok to reduce carvedilol  for bradycardia and dizziness and/or increase Losartan  for hypertension. Can call her back to prescribe changes or speak with patient directly.   Patient denies:  -SOB, swelling              Interventions/Plan: - Encounter forwarded to primary cardiologist for review/recommendations.    Erin agrees with plan, no questions at this time

## 2023-11-06 NOTE — Telephone Encounter (Signed)
 Chana Comas, NP w/ [facility] calling to report at home visit pt's hr low 50's dizziness symptomatic Wants to know if ok to reduce carvedilol  [phone]

## 2023-11-07 MED ORDER — LOSARTAN POTASSIUM 100 MG PO TABS: 100.0000 mg | ORAL_TABLET | Freq: Every day | ORAL | 3 refills | Status: AC

## 2023-11-07 MED ORDER — CARVEDILOL 6.25 MG PO TABS: 6.2500 mg | ORAL_TABLET | Freq: Two times a day (BID) | ORAL | 3 refills | Status: AC

## 2023-11-07 NOTE — Telephone Encounter (Signed)
 Called and left message for home health nurse to call back.  Called and talked to patient's wife (DPR), and informed her of Dr. Francie Irani advisement. Will send to patient's pharmacy.   Jeremy Rule, MD to [facility] Triage  Jeremy Kitchen, RN    11/07/23  7:07 AM Can decrease coreg  to 6.25 bid and increase losartan  to 100 mg daily

## 2023-11-20 DIAGNOSIS — I10 Essential (primary) hypertension: Secondary | ICD-10-CM | POA: Diagnosis not present

## 2023-11-20 DIAGNOSIS — E782 Mixed hyperlipidemia: Secondary | ICD-10-CM | POA: Diagnosis not present

## 2023-11-20 DIAGNOSIS — N401 Enlarged prostate with lower urinary tract symptoms: Secondary | ICD-10-CM | POA: Diagnosis not present

## 2023-11-20 DIAGNOSIS — N1831 Chronic kidney disease, stage 3a: Secondary | ICD-10-CM | POA: Diagnosis not present

## 2023-11-25 ENCOUNTER — Emergency Department (HOSPITAL_COMMUNITY)

## 2023-11-25 ENCOUNTER — Emergency Department (HOSPITAL_COMMUNITY)
Admission: EM | Admit: 2023-11-25 | Discharge: 2023-11-25 | Disposition: A | Discharge status: Home / Self Care | Attending: Emergency Medicine | Admitting: Emergency Medicine

## 2023-11-25 ENCOUNTER — Other Ambulatory Visit: Payer: Self-pay

## 2023-11-25 DIAGNOSIS — R55 Syncope and collapse: Secondary | ICD-10-CM | POA: Diagnosis not present

## 2023-11-25 DIAGNOSIS — Z7902 Long term (current) use of antithrombotics/antiplatelets: Secondary | ICD-10-CM | POA: Diagnosis not present

## 2023-11-25 DIAGNOSIS — Z79899 Other long term (current) drug therapy: Secondary | ICD-10-CM | POA: Diagnosis not present

## 2023-11-25 DIAGNOSIS — N179 Acute kidney failure, unspecified: Secondary | ICD-10-CM | POA: Insufficient documentation

## 2023-11-25 DIAGNOSIS — R001 Bradycardia, unspecified: Secondary | ICD-10-CM | POA: Diagnosis not present

## 2023-11-25 DIAGNOSIS — I959 Hypotension, unspecified: Secondary | ICD-10-CM | POA: Diagnosis not present

## 2023-11-25 DIAGNOSIS — I2510 Atherosclerotic heart disease of native coronary artery without angina pectoris: Secondary | ICD-10-CM | POA: Insufficient documentation

## 2023-11-25 DIAGNOSIS — R42 Dizziness and giddiness: Secondary | ICD-10-CM | POA: Diagnosis not present

## 2023-11-25 DIAGNOSIS — Z7982 Long term (current) use of aspirin: Secondary | ICD-10-CM | POA: Insufficient documentation

## 2023-11-25 DIAGNOSIS — Z8673 Personal history of transient ischemic attack (TIA), and cerebral infarction without residual deficits: Secondary | ICD-10-CM | POA: Insufficient documentation

## 2023-11-25 DIAGNOSIS — E860 Dehydration: Secondary | ICD-10-CM | POA: Insufficient documentation

## 2023-11-25 DIAGNOSIS — I447 Left bundle-branch block, unspecified: Secondary | ICD-10-CM | POA: Diagnosis not present

## 2023-11-25 DIAGNOSIS — M129 Arthropathy, unspecified: Secondary | ICD-10-CM | POA: Diagnosis not present

## 2023-11-25 DIAGNOSIS — I10 Essential (primary) hypertension: Secondary | ICD-10-CM | POA: Diagnosis not present

## 2023-11-25 DIAGNOSIS — R531 Weakness: Secondary | ICD-10-CM | POA: Diagnosis not present

## 2023-11-25 LAB — CBC WITH DIFFERENTIAL/PLATELET
Abs Immature Granulocytes: 0.02 K/uL (ref 0.00–0.07)
Basophils Absolute: 0 K/uL (ref 0.0–0.1)
Basophils Relative: 1 %
Eosinophils Absolute: 0.2 K/uL (ref 0.0–0.5)
Eosinophils Relative: 2 %
HCT: 34.9 % — ABNORMAL LOW (ref 39.0–52.0)
Hemoglobin: 11.3 g/dL — ABNORMAL LOW (ref 13.0–17.0)
Immature Granulocytes: 0 %
Lymphocytes Relative: 9 %
Lymphs Abs: 0.8 K/uL (ref 0.7–4.0)
MCH: 29 pg (ref 26.0–34.0)
MCHC: 32.4 g/dL (ref 30.0–36.0)
MCV: 89.7 fL (ref 80.0–100.0)
Monocytes Absolute: 0.6 K/uL (ref 0.1–1.0)
Monocytes Relative: 6 %
Neutro Abs: 7.1 K/uL (ref 1.7–7.7)
Neutrophils Relative %: 82 %
Platelets: 161 K/uL (ref 150–400)
RBC: 3.89 MIL/uL — ABNORMAL LOW (ref 4.22–5.81)
RDW: 15 % (ref 11.5–15.5)
WBC: 8.7 K/uL (ref 4.0–10.5)
nRBC: 0 % (ref 0.0–0.2)

## 2023-11-25 LAB — COMPREHENSIVE METABOLIC PANEL WITH GFR
ALT: 12 U/L (ref 0–44)
AST: 13 U/L — ABNORMAL LOW (ref 15–41)
Albumin: 2.9 g/dL — ABNORMAL LOW (ref 3.5–5.0)
Alkaline Phosphatase: 40 U/L (ref 38–126)
Anion gap: 8 (ref 5–15)
BUN: 21 mg/dL (ref 8–23)
CO2: 25 mmol/L (ref 22–32)
Calcium: 8.4 mg/dL — ABNORMAL LOW (ref 8.9–10.3)
Chloride: 108 mmol/L (ref 98–111)
Creatinine, Ser: 1.79 mg/dL — ABNORMAL HIGH (ref 0.61–1.24)
GFR, Estimated: 39 mL/min — ABNORMAL LOW (ref >60)
Glucose, Bld: 113 mg/dL — ABNORMAL HIGH (ref 70–99)
Potassium: 4.5 mmol/L (ref 3.5–5.1)
Sodium: 141 mmol/L (ref 135–145)
Total Bilirubin: 0.6 mg/dL (ref 0.0–1.2)
Total Protein: 5.1 g/dL — ABNORMAL LOW (ref 6.5–8.1)

## 2023-11-25 LAB — TROPONIN I (HIGH SENSITIVITY)
Troponin I (High Sensitivity): 8 ng/L (ref <18)
Troponin I (High Sensitivity): 9 ng/L (ref <18)

## 2023-11-25 LAB — LACTIC ACID, PLASMA: Lactic Acid, Venous: 1.9 mmol/L (ref 0.5–1.9)

## 2023-11-25 MED ORDER — SODIUM CHLORIDE 0.9 % IV BOLUS
1000.0000 mL | Freq: Once | INTRAVENOUS | Status: AC
  Administered 2023-11-25: 1000 mL via INTRAVENOUS

## 2023-11-25 NOTE — ED Provider Notes (Signed)
 [place] EMERGENCY DEPARTMENT AT [facility] Provider Note   CSN: [id number] Arrival date & time: 11/25/23  1408     History  Chief Complaint  Patient presents with   Hypotension    Jeremy [name] is a 78 y.o. male with PMH as listed below who presents BIB [facility] c/o hypotension, pts wife found pt unresponsiveness in the car when wife came back from inside the grocery store, states the car was on and the [facility] was still running. Also c/o dizziness, diaphoresis. Patient has had this happen once before in January. He recently had his Losartan  increased to 100 mg every day. He also takes carvedilol  and had that decreased recently d/t bradycardia.  Blood pressures at home over the last few days have been good, between 120s and 150s systolic.  He denies any CP, SOB, palpitations, leg swelling, h/o DVT/PE. States he doesn't drink enough fluids on a daily basis. Feels much better after fluid administration w/ EMS. Initial BP was 86/60 with EMS.   Past Medical History:  Diagnosis Date   Anxiety    Arthritis    Basal cell carcinoma 02/11/2021   nod- left dorsal hand (EXC)   CAD (coronary artery disease)    a. s/p prior stenting of LAD and RCA b. 03/2019: NSTEMI and required PTCA/DESx1 to the LAD and successful scoring balloon angioplasty in the previously stented segment of LCx   Carotid stenosis, asymptomatic, right    Carpal tunnel syndrome    bilateral   Coronary artery disease    GERD (gastroesophageal reflux disease)    History of kidney stones    Hx of colonic polyp    Hypercholesterolemia    Hypertension    Myocardial infarction (HCC)    hx of   Obesity    SCC (squamous cell carcinoma) 02/11/2021   in situ- right temple (CX35FU)   Squamous cell carcinoma of skin 08/09/2016   well differentiated on right outer eye - tx p bx   Stroke (HCC)        Home Medications Prior to Admission medications   Medication Sig Start Date End Date Taking? Authorizing Provider   acetaminophen  (TYLENOL ) 500 MG tablet Take 500 mg by mouth every 6 (six) hours as needed for mild pain.    [provider]  aspirin  EC 81 MG EC tablet Take 1 tablet (81 mg total) by mouth daily. 03/28/19   Furth, Cadence H, PA-C  carvedilol  (COREG ) 6.25 MG tablet Take 1 tablet (6.25 mg total) by mouth 2 (two) times daily with a meal. 11/07/23   Delford Maude BROCKS, MD  clopidogrel  (PLAVIX ) 75 MG tablet Take 1 tablet by mouth once daily 12/28/22   Nishan, Peter C, MD  diltiazem  2 % GEL Apply 1 Application topically 2 (two) times daily. 06/14/22   Abran Norleen SAILOR, MD  divalproex  (DEPAKOTE ) 125 MG DR tablet Take 1 tablet (125 mg total) by mouth at bedtime. 10/09/23   Whitfield Raisin, NP  gabapentin  (NEURONTIN ) 300 MG capsule Take 1 capsule (300 mg total) by mouth 3 (three) times daily. 08/21/18   Sethi, Pramod S, MD  isosorbide  mononitrate (IMDUR ) 30 MG 24 hr tablet Take 1 tablet by mouth once daily 10/20/23   Nishan, Peter C, MD  losartan  (COZAAR ) 100 MG tablet Take 1 tablet (100 mg total) by mouth daily. 11/07/23   Nishan, Peter C, MD  nitroGLYCERIN  (NITROSTAT ) 0.4 MG SL tablet Place 1 tablet (0.4 mg total) under the tongue every 5 (five) minutes as  needed for chest pain. 10/05/20   Delford Maude BROCKS, MD  oxyCODONE  (ROXICODONE ) 15 MG immediate release tablet Take 15 mg by mouth every 6 (six) hours as needed for pain. 10/18/19   [provider]  pantoprazole  (PROTONIX ) 40 MG tablet Take 1 tablet (40 mg total) by mouth daily. 01/14/22   Dunn, Dayna N, PA-C  ranolazine  (RANEXA ) 500 MG 12 hr tablet Take 1 tablet (500 mg total) by mouth 2 (two) times daily. 10/24/22   Nishan, Peter C, MD  rosuvastatin  (CRESTOR ) 40 MG tablet Take 40 mg by mouth at bedtime. 10/30/19   [provider]  XIIDRA  5 % SOLN Place 1 drop into both eyes daily as needed (dry eye). 07/08/21   [provider]      Allergies    Codeine, Oxycontin  [oxycodone  hcl], Contrast media [iodinated contrast media], and Morphine  and  codeine    Review of Systems   Review of Systems A 10 point review of systems was performed and is negative unless otherwise reported in HPI.  Physical Exam Updated Vital Signs BP (!) 130/51   Pulse (!) 59   Temp 97.7 F (36.5 C) (Oral)   Resp 18   SpO2 99%  Physical Exam General: Normal appearing obese male, lying in bed.  HEENT: PERRLA, Sclera anicteric, MMM, trachea midline.  Cardiology: RRR, no murmurs/rubs/gallops. BL radial and DP pulses equal bilaterally.  Resp: Normal respiratory rate and effort. CTAB, no wheezes, rhonchi, crackles.  Abd: Soft, non-tender, non-distended. No rebound tenderness or guarding.  GU: Deferred. MSK: No peripheral edema or signs of trauma. Extremities without deformity or TTP. No cyanosis or clubbing. Skin: warm, dry. Neuro: A&Ox4, CNs II-XII grossly intact. MAEs. Sensation grossly intact.  Psych: Normal mood and affect.   ED Results / Procedures / Treatments   Labs (all labs ordered are listed, but only abnormal results are displayed) Labs Reviewed  CBC WITH DIFFERENTIAL/PLATELET - Abnormal; Notable for the following components:      Result Value   RBC 3.89 (*)    Hemoglobin 11.3 (*)    HCT 34.9 (*)    All other components within normal limits  COMPREHENSIVE METABOLIC PANEL WITH GFR - Abnormal; Notable for the following components:   Glucose, Bld 113 (*)    Creatinine, Ser 1.79 (*)    Calcium  8.4 (*)    Total Protein 5.1 (*)    Albumin 2.9 (*)    AST 13 (*)    GFR, Estimated 39 (*)    All other components within normal limits  LACTIC ACID, PLASMA  TROPONIN I (HIGH SENSITIVITY)  TROPONIN I (HIGH SENSITIVITY)    EKG EKG Interpretation Date/Time:  Saturday November 25 2023 14:29:42 EDT Ventricular Rate:  60 PR Interval:    QRS Duration:  162 QT Interval:  439 QTC Calculation: 439 R Axis:   32  Text Interpretation: Normal sinus rhythm Prolonged PR interval Left bundle branch block Similar to prior Confirmed by Franklyn Gills [phone])  on 11/25/2023 3:05:07 PM  Radiology DG Chest Port 1 View Result Date: 11/25/2023 CLINICAL DATA:  Weakness, hypotension EXAM: PORTABLE CHEST 1 VIEW COMPARISON:  None Available. FINDINGS: Lordotic view. Normal cardiac silhouette. No effusion, infiltrate, or pneumothorax. Shoulder arthropathy noted IMPRESSION: No acute cardiopulmonary process. Electronically Signed   By: Jackquline Boxer M.D.   On: 11/25/2023 15:17    Procedures Procedures    Medications Ordered in ED Medications  sodium chloride  0.9 % bolus 1,000 mL (0 mLs Intravenous Stopped 11/25/23 1634)  ED Course/ Medical Decision Making/ A&P                          Medical Decision Making Amount and/or Complexity of Data Reviewed Labs: ordered. Decision-making details documented in ED Course. Radiology:  Decision-making details documented in ED Course.    This patient presents to the ED for concern of syncope, hypotension; this involves an extensive number of treatment options, and is a complaint that carries with it a high risk of complications and morbidity.  I considered the following differential and admission for this acute, potentially life threatening condition.   MDM:    DDX for syncope includes but is not limited to:  No significant anemia, electrolyte derangements, hypo-/hyperglycemia.  No neurosymptoms to indicate a CVA and his hypotension and AKI make dehydration and orthostatic hypotension or orthostatic syncope much more likely.  No chest pain, shortness of breath to indicate PE or ACS and EKG is without any ischemic signs, troponin is also negative x2.  No witnessed seizure-like activity, no headache to indicate ICH, no focal neurodeficits, no chest pain to indicate dissection, no report of GIB.   Clinical Course as of 11/25/23 2111  Sat Nov 25, 2023  1526 DG Chest [place] 1 View No acute cardiopulmonary process. [HN]  1526 WBC: 8.7 No leukocytosis  [HN]  1526 Creatinine(!): 1.79 +AKI [HN]    Clinical Course  User Index [HN] Franklyn Sid SAILOR, MD    Patient states he feels well and back to his baseline and would like to be discharged.  I encouraged patient to follow-up with his cardiologist or primary care physician within 1 to 2 weeks about his syncope.  Likely he was dehydrated as he does not drink any fluids.  Encourage patient to drink 64 ounces of fluid per day as is recommended unless otherwise instructed by his cardiologist.  Patient does not have any recurrence of his symptoms during his nearly 4-hour stay in the emergency department.  He is instructed to continue his blood pressure medicines as prescribed and to take his blood pressure twice per day, as I believe this incident was likely caused by dehydration, however if his blood pressures are trending down at home he is instructed to decrease his blood pressure medicines again.  He already has follow-up next week scheduled.  Discharge within discharge instructions and return precautions.  All questions answered to patient and his family satisfaction.   Labs: I Ordered, and personally interpreted labs.  The pertinent results include: Those listed above  Imaging Studies ordered: chest x-ray ordered from triage I independently visualized and interpreted imaging. I agree with the radiologist interpretation  Additional history obtained from chart review, family at bedside.    Cardiac Monitoring: The patient was maintained on a cardiac monitor.  I personally viewed and interpreted the cardiac monitored which showed an underlying rhythm of: Normal sinus rhythm  Reevaluation: After the interventions noted above, I reevaluated the patient and found that they have :resolved  Social Determinants of Health:  lives independently  Disposition:  DC w/ discharge instructions/return precautions. All questions answered to patient's satisfaction.    Co morbidities that complicate the patient evaluation  Past Medical History:  Diagnosis Date    Anxiety    Arthritis    Basal cell carcinoma 02/11/2021   nod- left dorsal hand (EXC)   CAD (coronary artery disease)    a. s/p prior stenting of LAD and RCA b. 03/2019: NSTEMI and required PTCA/DESx1  to the LAD and successful scoring balloon angioplasty in the previously stented segment of LCx   Carotid stenosis, asymptomatic, right    Carpal tunnel syndrome    bilateral   Coronary artery disease    GERD (gastroesophageal reflux disease)    History of kidney stones    Hx of colonic polyp    Hypercholesterolemia    Hypertension    Myocardial infarction (HCC)    hx of   Obesity    SCC (squamous cell carcinoma) 02/11/2021   in situ- right temple (CX35FU)   Squamous cell carcinoma of skin 08/09/2016   well differentiated on right outer eye - tx p bx   Stroke (HCC)      Medicines Meds ordered this encounter  Medications   sodium chloride  0.9 % bolus 1,000 mL    I have reviewed the patients home medicines and have made adjustments as needed  Problem List / ED Course: Problem List Items Addressed This Visit   None Visit Diagnoses       Syncope and collapse    -  Primary     AKI (acute kidney injury) (HCC)         Dehydration                       This note was created using dictation software, which may contain spelling or grammatical errors.    Franklyn Sid SAILOR, MD 11/25/23 2115

## 2023-11-25 NOTE — ED Triage Notes (Signed)
 Pt arrived via [facility] c/o hypotension, pts wife found pt unresponsiveness in the car when wife came back from inside the grocery store, states the car was on and the [facility] was still running.. Also c/o dizziness, diaphoresis. Initial BP was 86/60 with EMS. EDP at bedside

## 2023-11-25 NOTE — Discharge Instructions (Signed)
 Thank you for coming to [facility] Emergency Department. You were seen for passing out and low blood pressure. We did an exam, labs, and imaging, and these showed a small acute kidney injury and likely dehydration. You improved with fluids. Please drink plenty of water  (recommended 64 oz) per day.   Please follow up with your primary care provider within 1 week.   Do not hesitate to return to the ED or call 911 if you experience: -Worsening symptoms -Chest pain, shortness of breath -Lightheadedness, passing out -Fevers/chills -Anything else that concerns you

## 2023-11-27 ENCOUNTER — Other Ambulatory Visit: Payer: Self-pay | Admitting: Cardiovascular Disease

## 2023-11-28 DIAGNOSIS — M545 Low back pain, unspecified: Secondary | ICD-10-CM | POA: Diagnosis not present

## 2023-11-28 DIAGNOSIS — G8929 Other chronic pain: Secondary | ICD-10-CM | POA: Diagnosis not present

## 2023-11-28 DIAGNOSIS — I10 Essential (primary) hypertension: Secondary | ICD-10-CM | POA: Diagnosis not present

## 2023-11-28 DIAGNOSIS — G629 Polyneuropathy, unspecified: Secondary | ICD-10-CM | POA: Diagnosis not present

## 2023-11-28 DIAGNOSIS — G40909 Epilepsy, unspecified, not intractable, without status epilepticus: Secondary | ICD-10-CM | POA: Diagnosis not present

## 2023-11-28 DIAGNOSIS — K219 Gastro-esophageal reflux disease without esophagitis: Secondary | ICD-10-CM | POA: Diagnosis not present

## 2023-11-28 DIAGNOSIS — I2510 Atherosclerotic heart disease of native coronary artery without angina pectoris: Secondary | ICD-10-CM | POA: Diagnosis not present

## 2023-11-28 DIAGNOSIS — J302 Other seasonal allergic rhinitis: Secondary | ICD-10-CM | POA: Diagnosis not present

## 2023-11-28 DIAGNOSIS — N1831 Chronic kidney disease, stage 3a: Secondary | ICD-10-CM | POA: Diagnosis not present

## 2023-11-28 DIAGNOSIS — I639 Cerebral infarction, unspecified: Secondary | ICD-10-CM | POA: Diagnosis not present

## 2023-11-28 DIAGNOSIS — N401 Enlarged prostate with lower urinary tract symptoms: Secondary | ICD-10-CM | POA: Diagnosis not present

## 2023-11-28 DIAGNOSIS — E782 Mixed hyperlipidemia: Secondary | ICD-10-CM | POA: Diagnosis not present

## 2023-12-01 ENCOUNTER — Other Ambulatory Visit: Payer: Self-pay | Admitting: Cardiovascular Disease

## 2023-12-01 NOTE — Progress Notes (Signed)
 Cardiology Office Note    Date:  12/14/2023   ID:  Jeremy [name], DOB 28-Feb-1946, MRN [id number]  PCP:  Jeremy Norleen PEDLAR, MD  Cardiologist: Maude Emmer, MD     History of Present Illness:    78 y.o. previously seen by Dr Kerin. History of CAD with stenting of the CircumlfexLAD. CRF;s HTN, HLD. PVD with history CVA and right CEA   NSTEMI 03/25/19 peak troponin 5345 ICLBBB on ECG  Cath by Dr. Verlin on 03/26/2019 and showed 50% prox-RCA stenosis, 60% dRCA, 50% RPDA, 90% prox LCx, 99% Prox-LAD, 70% Distal LCx, 60% 2nd Mrg, 80% 2nd Diagonal and preserved EF of 50-55%. Was treated with PTCA/DESx1 to the LAD and successful scoring balloon angioplasty in the previously stented segment of LCx.   Myovue done 10/31/22 was normal no ischemia EF 63%  Life long DAT with stents , restenosis and CVA   Admitted 09/18/20 with chest pain diaphoresis r/o TTE normal EF no RWMAls And no acute ECG changes Cr elevated 1.9 from baseline 1.6 ACE d/c and imdur  started   Admitted 11/14/21 with angina old LBBB and negative troponins .  CAth with patent stents to proximal LAD and LCX Had 80% mid RCA lesion with new stent. Has residual small vessel disease in OM and diagonals EF preserved by TTE 60-65% 11/14/21 Carotids with plaque no stenosis Had ? Embolic stroke post cath with aphasia, confusion and combativeness Was reloaded with Depakote  MRI with right PCA stroke   Long discussion about need for weight loss He finished cardiac rehab 05/27/22   Seems to have some depression or cognitive slowing from stroke Wife complains that he has fatigue and can sleep for days TSH is normal discussed updating echo for persistent dyspnea and fatigue TTE 10/25/21 wth normal EF 60-65% Myovue 10/31/19 normal perfusion no ischemia EF 63% Still gets some angina with yard work  Some headaches will see [facility] next week Fatigue improved no angina Coreg  decreased and losartan  increased for some dizziness and bradycardia  Seen in ED  11/25/23 found unresponsive in car with low BP BP 86/60 mmHg by EMS Felt better with iv fluids BP on d/c from EF 130/51 mmHg Telemetry with no arrhythmia. ECG with chronic LBBB and first degree No chest pain Troponin negative Hct 34.9 BUN 21 Cr 1.79   No recurrence since meds adjusted   Has two dogs at home but does not walk them Discussed being more active     Past Medical History:  Diagnosis Date   Anxiety    Arthritis    Basal cell carcinoma 02/11/2021   nod- left dorsal hand (EXC)   CAD (coronary artery disease)    a. s/p prior stenting of LAD and RCA b. 03/2019: NSTEMI and required PTCA/DESx1 to the LAD and successful scoring balloon angioplasty in the previously stented segment of LCx   Carotid stenosis, asymptomatic, right    Carpal tunnel syndrome    bilateral   Coronary artery disease    GERD (gastroesophageal reflux disease)    History of kidney stones    Hx of colonic polyp    Hypercholesterolemia    Hypertension    Myocardial infarction (HCC)    hx of   Obesity    SCC (squamous cell carcinoma) 02/11/2021   in situ- right temple (CX35FU)   Squamous cell carcinoma of skin 08/09/2016   well differentiated on right outer eye - tx p bx   Stroke [facility])     Past Surgical History:  Procedure Laterality Date   CARDIAC CATHETERIZATION     CATARACT EXTRACTION W/PHACO Left 03/12/2021   Procedure: CATARACT EXTRACTION PHACO AND INTRAOCULAR LENS PLACEMENT (IOC);  Surgeon: Harrie Agent, MD;  Location: [facility];  Service: Ophthalmology;  Laterality: Left;   CDE: 14.58   CATARACT EXTRACTION W/PHACO Right 04/08/2021   Procedure: CATARACT EXTRACTION PHACO AND INTRAOCULAR LENS PLACEMENT RIGHT EYE;  Surgeon: Harrie Agent, MD;  Location: [facility];  Service: Ophthalmology;  Laterality: Right;  CDE 10.94   CORONARY BALLOON ANGIOPLASTY N/A 03/26/2019   Procedure: CORONARY BALLOON ANGIOPLASTY;  Surgeon: Verlin Lonni BIRCH, MD;  Location: [facility];  Service: Cardiovascular;   Laterality: N/A;   CORONARY STENT INTERVENTION N/A 03/26/2019   Procedure: CORONARY STENT INTERVENTION;  Surgeon: Verlin Lonni BIRCH, MD;  Location: [facility];  Service: Cardiovascular;  Laterality: N/A;   CORONARY STENT INTERVENTION N/A 11/15/2021   Procedure: CORONARY STENT INTERVENTION;  Surgeon: Court Dorn PARAS, MD;  Location: [facility];  Service: Cardiovascular;  Laterality: N/A;   CORONARY STENT PLACEMENT  2000   x3    CYST EXCISION     x 2   ENDARTERECTOMY Right 12/25/2017   Procedure: ENDARTERECTOMY CAROTID RIGHT;  Surgeon: Oris Krystal FALCON, MD;  Location: [facility] OR;  Service: Vascular;  Laterality: Right;   LEFT HEART CATH AND CORONARY ANGIOGRAPHY N/A 03/26/2019   Procedure: LEFT HEART CATH AND CORONARY ANGIOGRAPHY;  Surgeon: Verlin Lonni BIRCH, MD;  Location: [facility];  Service: Cardiovascular;  Laterality: N/A;   LEFT HEART CATH AND CORONARY ANGIOGRAPHY N/A 11/15/2021   Procedure: LEFT HEART CATH AND CORONARY ANGIOGRAPHY;  Surgeon: Court Dorn PARAS, MD;  Location: [facility];  Service: Cardiovascular;  Laterality: N/A;   PATCH ANGIOPLASTY Right 12/25/2017   Procedure: PATCH ANGIOPLASTY RIGHT CAROTID ARTERY;  Surgeon: Oris Krystal FALCON, MD;  Location: [facility] OR;  Service: Vascular;  Laterality: Right;   TEAR DUCT PROBING Bilateral 07/2020   done at [facility]. Dr. Greig Gay   TEE WITHOUT CARDIOVERSION N/A 06/29/2018   Procedure: TRANSESOPHAGEAL ECHOCARDIOGRAM (TEE);  Surgeon: Okey Vina GAILS, MD;  Location: [facility] ENDOSCOPY;  Service: Cardiovascular;  Laterality: N/A;    Current Medications: Outpatient Medications Prior to Visit  Medication Sig Dispense Refill   acetaminophen  (TYLENOL ) 500 MG tablet Take 500 mg by mouth every 6 (six) hours as needed for mild pain.     aspirin  EC 81 MG EC tablet Take 1 tablet (81 mg total) by mouth daily. 90 tablet 3   carvedilol  (COREG ) 6.25 MG tablet Take 1 tablet (6.25 mg total) by mouth 2 (two) times daily with a meal.  180 tablet 3   clopidogrel  (PLAVIX ) 75 MG tablet Take 1 tablet by mouth once daily 90 tablet 3   diltiazem  2 % GEL Apply 1 Application topically 2 (two) times daily. 30 g 2   divalproex  (DEPAKOTE ) 125 MG DR tablet Take 1 tablet (125 mg total) by mouth at bedtime. 90 tablet 3   gabapentin  (NEURONTIN ) 300 MG capsule Take 1 capsule (300 mg total) by mouth 3 (three) times daily. 90 capsule 6   ipratropium (ATROVENT) 0.03 % nasal spray Place 2 sprays into both nostrils daily as needed.     isosorbide  mononitrate (IMDUR ) 30 MG 24 hr tablet Take 1 tablet by mouth once daily 90 tablet 0   ketoconazole (NIZORAL) 2 % shampoo Apply 1 Application topically daily as needed.     losartan  (COZAAR ) 100 MG tablet Take 1 tablet (100  mg total) by mouth daily. 90 tablet 3   methocarbamol (ROBAXIN) 500 MG tablet Take 500 mg by mouth 3 (three) times daily as needed.     montelukast (SINGULAIR) 10 MG tablet Take 10 mg by mouth at bedtime.     nitroGLYCERIN  (NITROSTAT ) 0.4 MG SL tablet Place 1 tablet (0.4 mg total) under the tongue every 5 (five) minutes as needed for chest pain. 25 tablet 3   nystatin  cream (MYCOSTATIN ) Apply 1 Application topically daily as needed.     NYSTATIN  powder Apply 1 Application topically daily as needed.     oxyCODONE  (ROXICODONE ) 15 MG immediate release tablet Take 15 mg by mouth every 6 (six) hours as needed for pain.     pantoprazole  (PROTONIX ) 40 MG tablet Take 1 tablet (40 mg total) by mouth daily. 90 tablet 3   ranolazine  (RANEXA ) 500 MG 12 hr tablet Take 1 tablet by mouth twice daily 60 tablet 0   rosuvastatin  (CRESTOR ) 40 MG tablet Take 40 mg by mouth at bedtime.     XIIDRA  5 % SOLN Place 1 drop into both eyes daily as needed (dry eye).     No facility-administered medications prior to visit.     Allergies:   Codeine, Oxycontin  [oxycodone  hcl], Contrast media [iodinated contrast media], and Morphine  and codeine   Social History   Socioeconomic History   Marital status:  Married    Spouse name: Not on file   Number of children: 2   Years of education: Not on file   Highest education level: Not on file  Occupational History   Occupation: retired    Comment: welder  Tobacco Use   Smoking status: Former    Current packs/day: 0.00    Average packs/day: 2.0 packs/day for 20.0 years (40.0 ttl pk-yrs)    Types: Cigarettes    Start date: 05/23/1968    Quit date: 05/23/1988    Years since quitting: 35.5   Smokeless tobacco: Never   Tobacco comments:    smoked 2 packs per day for 20 years  Vaping Use   Vaping status: Never Used  Substance and Sexual Activity   Alcohol  use: No   Drug use: No   Sexual activity: Yes  Other Topics Concern   Not on file  Social History Narrative   Not on file   Social Drivers of Health   Financial Resource Strain: Not on file  Food Insecurity: Not on file  Transportation Needs: Not on file  Physical Activity: Not on file  Stress: Not on file  Social Connections: Not on file     Family History:  The patient's family history includes Coronary artery disease in his brother; Coronary artery disease (age of onset: 79) in his mother; Hypertension in his mother; Other (age of onset: 71) in his father; Other (age of onset: 56) in his sister; Skin cancer in his brother.   Review of Systems:   Please see the history of present illness.     General:  No chills, fever, night sweats or weight changes. Positive for bilateral shoulder pain (occurring for several months). Cardiovascular:  No chest pain, dyspnea on exertion, edema, orthopnea, palpitations, paroxysmal nocturnal dyspnea. Dermatological: No rash, lesions/masses Respiratory: No cough, dyspnea Urologic: No hematuria, dysuria Abdominal:   No nausea, vomiting, diarrhea, bright red blood per rectum, melena, or hematemesis Neurologic:  No visual changes, wkns, changes in mental status. All other systems reviewed and are otherwise negative except as noted above.   Physical  Exam:  VS:  BP 136/62 (BP Location: Right Arm)   Pulse 80   Ht 5' 7 (1.702 m)   Wt 210 lb (95.3 kg)   SpO2 93%   BMI 32.89 kg/m     Affect appropriate Healthy:  appears stated age HEENT: normal Neck supple with no adenopathy JVP normal right CEA s no thyromegaly Lungs clear with no wheezing and good diaphragmatic motion Heart:  S1/S2 no murmur, no rub, gallop or click PMI normal Abdomen: benighn, BS positve, no tenderness, no AAA no bruit.  No HSM or HJR Distal pulses intact with no bruits No edema Neuro non-focal Skin warm and dry No muscular weakness Restricted ROM both shoulders    Wt Readings from Last 3 Encounters:  12/14/23 210 lb (95.3 kg)  10/09/23 202 lb (91.6 kg)  06/01/23 205 lb (93 kg)     Studies/Labs Reviewed:   EKG:   NSR, HR 65 with anterior infarct pattern and diffuse TWI along the lateral leads which has improved when compared to his prior tracing.   Recent Labs: 11/25/2023: ALT 12; BUN 21; Creatinine, Ser 1.79; Hemoglobin 11.3; Platelets 161; Potassium 4.5; Sodium 141   Lipid Panel    Component Value Date/Time   CHOL 124 11/14/2021 0245   CHOL 125 02/11/2019 0847   TRIG 164 (H) 11/14/2021 0245   HDL 32 (L) 11/14/2021 0245   HDL 45 02/11/2019 0847   CHOLHDL 3.9 11/14/2021 0245   VLDL 33 11/14/2021 0245   LDLCALC 59 11/14/2021 0245   LDLCALC 64 02/11/2019 0847    Additional studies/ records that were reviewed today include:   Cardiac Catheterization: 11/15/21  Diagnostic Dominance: Right Left Anterior Descending  Vessel is large.  Non-stenotic Prox LAD lesion was previously treated.    Second Diagonal Branch  Vessel is small in size.  2nd Diag lesion is 80% stenosed.    Left Circumflex  Vessel is large.  Prox Cx to Mid Cx lesion is 20% stenosed. The lesion was previously treated using a stent (unknown type) over 2 years ago.  Dist Cx lesion is 70% stenosed.    Second Obtuse Marginal Branch  Vessel is moderate in size.  2nd  Mrg lesion is 60% stenosed.    Third Obtuse Marginal Branch  Vessel is moderate in size.  3rd Mrg lesion is 90% stenosed.    Right Coronary Artery  Vessel is large.  Prox RCA to Mid RCA lesion is 80% stenosed. The lesion was previously treated .  Dist RCA lesion is 90% stenosed.    Right Posterior Descending Artery  RPDA lesion is 50% stenosed.    Intervention   Prox RCA to Mid RCA lesion  Stent  Lesion crossed with guidewire. Pre-stent angioplasty was performed. A drug-eluting stent was successfully placed using a SYNERGY XD [id number]. Stent strut is well apposed. Stent overlaps previously placed stent. Post-stent angioplasty was performed.  Post-Intervention Lesion Assessment  The intervention was successful. Pre-interventional TIMI flow is 3. Post-intervention TIMI flow is 3. No-reflow occurred during the intervention.  There is a 0% residual stenosis post intervention.    Dist RCA lesion  Stent  Lesion crossed with guidewire. Pre-stent angioplasty was performed. A stent was successfully placed. Stent strut is well apposed. Stent does not overlap previously placed stentPost-stent angioplasty was not performed.  Post-Intervention Lesion Assessment  The intervention was successful. Pre-interventional TIMI flow is 3. Post-intervention TIMI flow is 3. No complications occurred at this lesion.  There is a 0% residual stenosis post intervention.  Echo 11/14/21   IMPRESSIONS     1. Left ventricular ejection fraction, by estimation, is 60 to 65%. The  left ventricle has normal function. The left ventricle has no regional  wall motion abnormalities. There is mild left ventricular hypertrophy.  Left ventricular diastolic parameters  are consistent with Grade I diastolic dysfunction (impaired relaxation).  The average left ventricular global longitudinal strain is -13.3 %. The  global longitudinal strain is abnormal.   2. Right ventricular systolic function is normal. The right  ventricular  size is normal.   3. The mitral valve is normal in structure. Trivial mitral valve  regurgitation. No evidence of mitral stenosis.   4. The aortic valve is tricuspid. Aortic valve regurgitation is not  visualized. No aortic stenosis is present.   5. The inferior vena cava is normal in size with greater than 50%  respiratory variability, suggesting right atrial pressure of 3 mmHg.   Comparison(s): No significant change from prior study. Prior images  reviewed side by side. 08/23/20-EF 60-65%.   Myovue 10/31/22   Study Result  Narrative & Impression      The study is normal. There are no perfusion defects consistent with prior infarct or current ischemia.  The study is low risk.   No ST deviation was noted.   LV perfusion is normal.   Left ventricular function is normal. Nuclear stress EF: 63 %. The left ventricular ejection fraction is normal (55-65%). End diastolic cavity size is normal.   There is a large moderate intensity inferoseptal defect in the resting images not present in the stress images with normal wall motion and adjactent gut radiotracer uptake. Findings consistent with artifact.     Plan:   In order of problems listed above:  1. CAD:  Distant history of RCA stent and 03/2019 intervention to LAD and circumflex  Cath 11/14/21 restenosis of stents to mid/distal RCA with repeat intervention Residual small diagonal and OM dx stents to proximal LAD and mid LCX patent Stable angina  Ranexa  500 mg bid in addition to imdur  and beta blocker Lexiscan  myovue non ischemic with normal EF 10/31/22   2. HTN BP has been low with bradycardia and known conduction dx on ECG first degree LBBB.  Coreg  decreased Was taking losartan  100 mg daily to compensate for lower coreg  dose     3. HLD  LDL 59 11/14/21  continue crestor     4. History of CVA and another post cath cerebellar June 2023 carotids ok on Depakote  f/u with neuro Valproic  Acid Level normal 07/06/22   5. CRF:  Baseline  Cr 1.79   6. Fatigue:  non cardiac F/U primary consider SSRI. ? Also does he still need to be on Depakote  and Neurontin  post stroke Will f/u with Dr Shona TSH normal EF normal by recent myovue 63%  7. Syncope: ? Vagal in heat and med related Beta blocker decreased. Has LBBB if recurs discussed possible loop recorder     F/U in a year   Signed, Maude Emmer, MD  12/14/2023 8:57 AM     [facility] [address] [place], [name] Phone: [phone] Fax: [phone]

## 2023-12-04 DIAGNOSIS — R55 Syncope and collapse: Secondary | ICD-10-CM | POA: Diagnosis not present

## 2023-12-04 DIAGNOSIS — Z8673 Personal history of transient ischemic attack (TIA), and cerebral infarction without residual deficits: Secondary | ICD-10-CM | POA: Diagnosis not present

## 2023-12-04 DIAGNOSIS — D649 Anemia, unspecified: Secondary | ICD-10-CM | POA: Insufficient documentation

## 2023-12-04 DIAGNOSIS — M545 Low back pain, unspecified: Secondary | ICD-10-CM | POA: Diagnosis not present

## 2023-12-04 DIAGNOSIS — N1831 Chronic kidney disease, stage 3a: Secondary | ICD-10-CM | POA: Diagnosis not present

## 2023-12-04 DIAGNOSIS — E782 Mixed hyperlipidemia: Secondary | ICD-10-CM | POA: Diagnosis not present

## 2023-12-04 DIAGNOSIS — G40909 Epilepsy, unspecified, not intractable, without status epilepticus: Secondary | ICD-10-CM | POA: Diagnosis not present

## 2023-12-04 DIAGNOSIS — I129 Hypertensive chronic kidney disease with stage 1 through stage 4 chronic kidney disease, or unspecified chronic kidney disease: Secondary | ICD-10-CM | POA: Diagnosis not present

## 2023-12-04 DIAGNOSIS — E778 Other disorders of glycoprotein metabolism: Secondary | ICD-10-CM | POA: Diagnosis not present

## 2023-12-04 DIAGNOSIS — D631 Anemia in chronic kidney disease: Secondary | ICD-10-CM | POA: Diagnosis not present

## 2023-12-04 DIAGNOSIS — K648 Other hemorrhoids: Secondary | ICD-10-CM | POA: Diagnosis not present

## 2023-12-14 ENCOUNTER — Ambulatory Visit: Attending: Cardiovascular Disease | Admitting: Cardiovascular Disease

## 2023-12-14 ENCOUNTER — Encounter: Payer: Self-pay | Admitting: Cardiovascular Disease

## 2023-12-14 VITALS — BP 136/62 | HR 80 | Ht 67.0 in | Wt 210.0 lb

## 2023-12-14 DIAGNOSIS — I2510 Atherosclerotic heart disease of native coronary artery without angina pectoris: Secondary | ICD-10-CM

## 2023-12-14 DIAGNOSIS — E785 Hyperlipidemia, unspecified: Secondary | ICD-10-CM

## 2023-12-14 DIAGNOSIS — Z9889 Other specified postprocedural states: Secondary | ICD-10-CM | POA: Diagnosis not present

## 2023-12-14 DIAGNOSIS — I10 Essential (primary) hypertension: Secondary | ICD-10-CM | POA: Diagnosis not present

## 2023-12-14 NOTE — Patient Instructions (Signed)
 Medication Instructions:  Your physician recommends that you continue on your current medications as directed. Please refer to the Current Medication list given to you today.   Labwork: None today  Testing/Procedures: None today  Follow-Up: 6 months  Any Other Special Instructions Will Be Listed Below (If Applicable).  If you need a refill on your cardiac medications before your next appointment, please call your pharmacy.

## 2023-12-19 DIAGNOSIS — K219 Gastro-esophageal reflux disease without esophagitis: Secondary | ICD-10-CM | POA: Diagnosis not present

## 2023-12-19 DIAGNOSIS — I10 Essential (primary) hypertension: Secondary | ICD-10-CM | POA: Diagnosis not present

## 2023-12-19 DIAGNOSIS — G629 Polyneuropathy, unspecified: Secondary | ICD-10-CM | POA: Diagnosis not present

## 2023-12-19 DIAGNOSIS — N1831 Chronic kidney disease, stage 3a: Secondary | ICD-10-CM | POA: Diagnosis not present

## 2023-12-19 DIAGNOSIS — M545 Low back pain, unspecified: Secondary | ICD-10-CM | POA: Diagnosis not present

## 2023-12-19 DIAGNOSIS — G8929 Other chronic pain: Secondary | ICD-10-CM | POA: Diagnosis not present

## 2023-12-19 DIAGNOSIS — E782 Mixed hyperlipidemia: Secondary | ICD-10-CM | POA: Diagnosis not present

## 2023-12-19 DIAGNOSIS — J302 Other seasonal allergic rhinitis: Secondary | ICD-10-CM | POA: Diagnosis not present

## 2023-12-19 DIAGNOSIS — G40909 Epilepsy, unspecified, not intractable, without status epilepticus: Secondary | ICD-10-CM | POA: Diagnosis not present

## 2023-12-19 DIAGNOSIS — I2510 Atherosclerotic heart disease of native coronary artery without angina pectoris: Secondary | ICD-10-CM | POA: Diagnosis not present

## 2023-12-19 DIAGNOSIS — I639 Cerebral infarction, unspecified: Secondary | ICD-10-CM | POA: Diagnosis not present

## 2023-12-19 DIAGNOSIS — N401 Enlarged prostate with lower urinary tract symptoms: Secondary | ICD-10-CM | POA: Diagnosis not present

## 2023-12-21 DIAGNOSIS — N401 Enlarged prostate with lower urinary tract symptoms: Secondary | ICD-10-CM | POA: Diagnosis not present

## 2023-12-21 DIAGNOSIS — I10 Essential (primary) hypertension: Secondary | ICD-10-CM | POA: Diagnosis not present

## 2023-12-21 DIAGNOSIS — E782 Mixed hyperlipidemia: Secondary | ICD-10-CM | POA: Diagnosis not present

## 2023-12-21 DIAGNOSIS — K219 Gastro-esophageal reflux disease without esophagitis: Secondary | ICD-10-CM | POA: Diagnosis not present

## 2024-01-19 DIAGNOSIS — I10 Essential (primary) hypertension: Secondary | ICD-10-CM | POA: Diagnosis not present

## 2024-01-19 DIAGNOSIS — G40909 Epilepsy, unspecified, not intractable, without status epilepticus: Secondary | ICD-10-CM | POA: Diagnosis not present

## 2024-01-19 DIAGNOSIS — N401 Enlarged prostate with lower urinary tract symptoms: Secondary | ICD-10-CM | POA: Diagnosis not present

## 2024-01-19 DIAGNOSIS — E782 Mixed hyperlipidemia: Secondary | ICD-10-CM | POA: Diagnosis not present

## 2024-01-22 ENCOUNTER — Other Ambulatory Visit: Payer: Self-pay | Admitting: Cardiovascular Disease

## 2024-01-30 DIAGNOSIS — G8929 Other chronic pain: Secondary | ICD-10-CM | POA: Diagnosis not present

## 2024-01-30 DIAGNOSIS — J302 Other seasonal allergic rhinitis: Secondary | ICD-10-CM | POA: Diagnosis not present

## 2024-01-30 DIAGNOSIS — N401 Enlarged prostate with lower urinary tract symptoms: Secondary | ICD-10-CM | POA: Diagnosis not present

## 2024-01-30 DIAGNOSIS — K219 Gastro-esophageal reflux disease without esophagitis: Secondary | ICD-10-CM | POA: Diagnosis not present

## 2024-01-30 DIAGNOSIS — E782 Mixed hyperlipidemia: Secondary | ICD-10-CM | POA: Diagnosis not present

## 2024-01-30 DIAGNOSIS — I10 Essential (primary) hypertension: Secondary | ICD-10-CM | POA: Diagnosis not present

## 2024-01-30 DIAGNOSIS — G40909 Epilepsy, unspecified, not intractable, without status epilepticus: Secondary | ICD-10-CM | POA: Diagnosis not present

## 2024-01-30 DIAGNOSIS — M545 Low back pain, unspecified: Secondary | ICD-10-CM | POA: Diagnosis not present

## 2024-01-30 DIAGNOSIS — G629 Polyneuropathy, unspecified: Secondary | ICD-10-CM | POA: Diagnosis not present

## 2024-01-30 DIAGNOSIS — N1831 Chronic kidney disease, stage 3a: Secondary | ICD-10-CM | POA: Diagnosis not present

## 2024-01-30 DIAGNOSIS — I639 Cerebral infarction, unspecified: Secondary | ICD-10-CM | POA: Diagnosis not present

## 2024-01-30 DIAGNOSIS — I2510 Atherosclerotic heart disease of native coronary artery without angina pectoris: Secondary | ICD-10-CM | POA: Diagnosis not present

## 2024-02-05 ENCOUNTER — Other Ambulatory Visit: Payer: Self-pay | Admitting: Cardiovascular Disease

## 2024-02-16 DIAGNOSIS — E782 Mixed hyperlipidemia: Secondary | ICD-10-CM | POA: Diagnosis not present

## 2024-02-16 DIAGNOSIS — R7301 Impaired fasting glucose: Secondary | ICD-10-CM | POA: Diagnosis not present

## 2024-02-17 LAB — LAB REPORT - SCANNED
A1c: 5.3
Albumin, Urine POC: 71.5
Albumin/Creatinine Ratio, Urine, POC: 34
Creatinine, POC: 211.4 mg/dL
EGFR: 49

## 2024-02-20 DIAGNOSIS — I10 Essential (primary) hypertension: Secondary | ICD-10-CM | POA: Diagnosis not present

## 2024-02-20 DIAGNOSIS — N401 Enlarged prostate with lower urinary tract symptoms: Secondary | ICD-10-CM | POA: Diagnosis not present

## 2024-02-20 DIAGNOSIS — E782 Mixed hyperlipidemia: Secondary | ICD-10-CM | POA: Diagnosis not present

## 2024-02-20 DIAGNOSIS — K219 Gastro-esophageal reflux disease without esophagitis: Secondary | ICD-10-CM | POA: Diagnosis not present

## 2024-02-22 DIAGNOSIS — G40909 Epilepsy, unspecified, not intractable, without status epilepticus: Secondary | ICD-10-CM | POA: Diagnosis not present

## 2024-02-22 DIAGNOSIS — K649 Unspecified hemorrhoids: Secondary | ICD-10-CM | POA: Diagnosis not present

## 2024-02-22 DIAGNOSIS — I639 Cerebral infarction, unspecified: Secondary | ICD-10-CM | POA: Diagnosis not present

## 2024-02-22 DIAGNOSIS — N1831 Chronic kidney disease, stage 3a: Secondary | ICD-10-CM | POA: Diagnosis not present

## 2024-02-22 DIAGNOSIS — I2510 Atherosclerotic heart disease of native coronary artery without angina pectoris: Secondary | ICD-10-CM | POA: Diagnosis not present

## 2024-02-22 DIAGNOSIS — J302 Other seasonal allergic rhinitis: Secondary | ICD-10-CM | POA: Diagnosis not present

## 2024-02-22 DIAGNOSIS — E782 Mixed hyperlipidemia: Secondary | ICD-10-CM | POA: Diagnosis not present

## 2024-02-22 DIAGNOSIS — D649 Anemia, unspecified: Secondary | ICD-10-CM | POA: Diagnosis not present

## 2024-02-22 DIAGNOSIS — Z0000 Encounter for general adult medical examination without abnormal findings: Secondary | ICD-10-CM | POA: Diagnosis not present

## 2024-02-22 DIAGNOSIS — I129 Hypertensive chronic kidney disease with stage 1 through stage 4 chronic kidney disease, or unspecified chronic kidney disease: Secondary | ICD-10-CM | POA: Diagnosis not present

## 2024-02-22 DIAGNOSIS — Z0001 Encounter for general adult medical examination with abnormal findings: Secondary | ICD-10-CM | POA: Diagnosis not present

## 2024-02-28 DIAGNOSIS — I639 Cerebral infarction, unspecified: Secondary | ICD-10-CM | POA: Diagnosis not present

## 2024-02-28 DIAGNOSIS — E782 Mixed hyperlipidemia: Secondary | ICD-10-CM | POA: Diagnosis not present

## 2024-02-28 DIAGNOSIS — N1831 Chronic kidney disease, stage 3a: Secondary | ICD-10-CM | POA: Diagnosis not present

## 2024-02-28 DIAGNOSIS — I2510 Atherosclerotic heart disease of native coronary artery without angina pectoris: Secondary | ICD-10-CM | POA: Diagnosis not present

## 2024-02-28 DIAGNOSIS — J302 Other seasonal allergic rhinitis: Secondary | ICD-10-CM | POA: Diagnosis not present

## 2024-02-28 DIAGNOSIS — G8929 Other chronic pain: Secondary | ICD-10-CM | POA: Diagnosis not present

## 2024-02-28 DIAGNOSIS — G629 Polyneuropathy, unspecified: Secondary | ICD-10-CM | POA: Diagnosis not present

## 2024-02-28 DIAGNOSIS — K219 Gastro-esophageal reflux disease without esophagitis: Secondary | ICD-10-CM | POA: Diagnosis not present

## 2024-02-28 DIAGNOSIS — G40909 Epilepsy, unspecified, not intractable, without status epilepticus: Secondary | ICD-10-CM | POA: Diagnosis not present

## 2024-02-28 DIAGNOSIS — M545 Low back pain, unspecified: Secondary | ICD-10-CM | POA: Diagnosis not present

## 2024-02-29 DIAGNOSIS — H401121 Primary open-angle glaucoma, left eye, mild stage: Secondary | ICD-10-CM | POA: Diagnosis not present

## 2024-02-29 DIAGNOSIS — H04223 Epiphora due to insufficient drainage, bilateral lacrimal glands: Secondary | ICD-10-CM | POA: Diagnosis not present

## 2024-02-29 DIAGNOSIS — H40021 Open angle with borderline findings, high risk, right eye: Secondary | ICD-10-CM | POA: Diagnosis not present

## 2024-02-29 DIAGNOSIS — Z961 Presence of intraocular lens: Secondary | ICD-10-CM | POA: Diagnosis not present

## 2024-03-01 ENCOUNTER — Other Ambulatory Visit: Payer: Self-pay | Admitting: Cardiovascular Disease

## 2024-03-22 DIAGNOSIS — K219 Gastro-esophageal reflux disease without esophagitis: Secondary | ICD-10-CM | POA: Diagnosis not present

## 2024-03-22 DIAGNOSIS — I10 Essential (primary) hypertension: Secondary | ICD-10-CM | POA: Diagnosis not present

## 2024-03-22 DIAGNOSIS — E782 Mixed hyperlipidemia: Secondary | ICD-10-CM | POA: Diagnosis not present

## 2024-03-22 DIAGNOSIS — N401 Enlarged prostate with lower urinary tract symptoms: Secondary | ICD-10-CM | POA: Diagnosis not present

## 2024-03-28 ENCOUNTER — Emergency Department (HOSPITAL_COMMUNITY)

## 2024-03-28 ENCOUNTER — Other Ambulatory Visit: Payer: Self-pay

## 2024-03-28 ENCOUNTER — Observation Stay (HOSPITAL_COMMUNITY)
Admission: EM | Admit: 2024-03-28 | Discharge: 2024-03-29 | Disposition: A | Discharge status: Home / Self Care | Attending: Family Medicine | Admitting: Family Medicine

## 2024-03-28 DIAGNOSIS — Z79899 Other long term (current) drug therapy: Secondary | ICD-10-CM | POA: Insufficient documentation

## 2024-03-28 DIAGNOSIS — I6523 Occlusion and stenosis of bilateral carotid arteries: Secondary | ICD-10-CM | POA: Diagnosis not present

## 2024-03-28 DIAGNOSIS — R404 Transient alteration of awareness: Principal | ICD-10-CM

## 2024-03-28 DIAGNOSIS — Z955 Presence of coronary angioplasty implant and graft: Secondary | ICD-10-CM | POA: Insufficient documentation

## 2024-03-28 DIAGNOSIS — N1832 Chronic kidney disease, stage 3b: Secondary | ICD-10-CM | POA: Insufficient documentation

## 2024-03-28 DIAGNOSIS — G9389 Other specified disorders of brain: Secondary | ICD-10-CM | POA: Diagnosis not present

## 2024-03-28 DIAGNOSIS — I130 Hypertensive heart and chronic kidney disease with heart failure and stage 1 through stage 4 chronic kidney disease, or unspecified chronic kidney disease: Secondary | ICD-10-CM | POA: Insufficient documentation

## 2024-03-28 DIAGNOSIS — R4182 Altered mental status, unspecified: Secondary | ICD-10-CM | POA: Diagnosis not present

## 2024-03-28 DIAGNOSIS — R410 Disorientation, unspecified: Secondary | ICD-10-CM | POA: Diagnosis not present

## 2024-03-28 DIAGNOSIS — E669 Obesity, unspecified: Secondary | ICD-10-CM | POA: Insufficient documentation

## 2024-03-28 DIAGNOSIS — R531 Weakness: Secondary | ICD-10-CM | POA: Diagnosis not present

## 2024-03-28 DIAGNOSIS — I447 Left bundle-branch block, unspecified: Secondary | ICD-10-CM | POA: Diagnosis not present

## 2024-03-28 DIAGNOSIS — G459 Transient cerebral ischemic attack, unspecified: Principal | ICD-10-CM | POA: Insufficient documentation

## 2024-03-28 DIAGNOSIS — Z7982 Long term (current) use of aspirin: Secondary | ICD-10-CM | POA: Insufficient documentation

## 2024-03-28 DIAGNOSIS — I6782 Cerebral ischemia: Secondary | ICD-10-CM | POA: Diagnosis not present

## 2024-03-28 DIAGNOSIS — R42 Dizziness and giddiness: Secondary | ICD-10-CM | POA: Diagnosis not present

## 2024-03-28 DIAGNOSIS — Z7901 Long term (current) use of anticoagulants: Secondary | ICD-10-CM | POA: Insufficient documentation

## 2024-03-28 DIAGNOSIS — I5032 Chronic diastolic (congestive) heart failure: Secondary | ICD-10-CM | POA: Insufficient documentation

## 2024-03-28 DIAGNOSIS — I700 Atherosclerosis of aorta: Secondary | ICD-10-CM | POA: Diagnosis not present

## 2024-03-28 DIAGNOSIS — Z87891 Personal history of nicotine dependence: Secondary | ICD-10-CM | POA: Insufficient documentation

## 2024-03-28 DIAGNOSIS — I2510 Atherosclerotic heart disease of native coronary artery without angina pectoris: Secondary | ICD-10-CM | POA: Insufficient documentation

## 2024-03-28 DIAGNOSIS — J9811 Atelectasis: Secondary | ICD-10-CM | POA: Diagnosis not present

## 2024-03-28 DIAGNOSIS — R918 Other nonspecific abnormal finding of lung field: Secondary | ICD-10-CM | POA: Diagnosis not present

## 2024-03-28 DIAGNOSIS — I10 Essential (primary) hypertension: Secondary | ICD-10-CM | POA: Diagnosis not present

## 2024-03-28 LAB — COMPREHENSIVE METABOLIC PANEL WITH GFR
ALT: 12 U/L (ref 0–44)
AST: 20 U/L (ref 15–41)
Albumin: 4.3 g/dL (ref 3.5–5.0)
Alkaline Phosphatase: 60 U/L (ref 38–126)
Anion gap: 11 (ref 5–15)
BUN: 22 mg/dL (ref 8–23)
CO2: 26 mmol/L (ref 22–32)
Calcium: 9.2 mg/dL (ref 8.9–10.3)
Chloride: 103 mmol/L (ref 98–111)
Creatinine, Ser: 1.79 mg/dL — ABNORMAL HIGH (ref 0.61–1.24)
GFR, Estimated: 38 mL/min — ABNORMAL LOW (ref >60)
Glucose, Bld: 86 mg/dL (ref 70–99)
Potassium: 4.2 mmol/L (ref 3.5–5.1)
Sodium: 140 mmol/L (ref 135–145)
Total Bilirubin: 0.5 mg/dL (ref 0.0–1.2)
Total Protein: 6.9 g/dL (ref 6.5–8.1)

## 2024-03-28 LAB — CBC WITH DIFFERENTIAL/PLATELET
Abs Immature Granulocytes: 0.02 K/uL (ref 0.00–0.07)
Basophils Absolute: 0.1 K/uL (ref 0.0–0.1)
Basophils Relative: 1 %
Eosinophils Absolute: 0.2 K/uL (ref 0.0–0.5)
Eosinophils Relative: 3 %
HCT: 40.8 % (ref 39.0–52.0)
Hemoglobin: 13.2 g/dL (ref 13.0–17.0)
Immature Granulocytes: 0 %
Lymphocytes Relative: 19 %
Lymphs Abs: 1.3 K/uL (ref 0.7–4.0)
MCH: 28.9 pg (ref 26.0–34.0)
MCHC: 32.4 g/dL (ref 30.0–36.0)
MCV: 89.5 fL (ref 80.0–100.0)
Monocytes Absolute: 0.8 K/uL (ref 0.1–1.0)
Monocytes Relative: 11 %
Neutro Abs: 4.6 K/uL (ref 1.7–7.7)
Neutrophils Relative %: 66 %
Platelets: 217 K/uL (ref 150–400)
RBC: 4.56 MIL/uL (ref 4.22–5.81)
RDW: 14 % (ref 11.5–15.5)
WBC: 7 K/uL (ref 4.0–10.5)
nRBC: 0 % (ref 0.0–0.2)

## 2024-03-28 LAB — TROPONIN T, HIGH SENSITIVITY
Troponin T High Sensitivity: 24 ng/L — ABNORMAL HIGH (ref 0–19)
Troponin T High Sensitivity: 23 ng/L — ABNORMAL HIGH (ref 0–19)

## 2024-03-28 MED ORDER — STROKE: EARLY STAGES OF RECOVERY BOOK
Freq: Once | Status: AC
  Filled 2024-03-28: qty 1

## 2024-03-28 MED ORDER — LACTATED RINGERS IV SOLN: INTRAVENOUS | Status: DC

## 2024-03-28 MED ORDER — PROCHLORPERAZINE EDISYLATE 10 MG/2ML IJ SOLN: 5.0000 mg | Freq: Four times a day (QID) | INTRAMUSCULAR | Status: DC | PRN

## 2024-03-28 MED ORDER — ACETAMINOPHEN 500 MG PO TABS
500.0000 mg | ORAL_TABLET | Freq: Four times a day (QID) | ORAL | Status: DC | PRN
  Administered 2024-03-28: 500 mg via ORAL
  Filled 2024-03-28: qty 1

## 2024-03-28 MED ORDER — CLOPIDOGREL BISULFATE 75 MG PO TABS
75.0000 mg | ORAL_TABLET | Freq: Every day | ORAL | Status: DC
  Administered 2024-03-28: 75 mg via ORAL
  Filled 2024-03-28: qty 1

## 2024-03-28 MED ORDER — ENOXAPARIN SODIUM 40 MG/0.4ML IJ SOSY: 40.0000 mg | PREFILLED_SYRINGE | INTRAMUSCULAR | Status: DC

## 2024-03-28 MED ORDER — PANTOPRAZOLE SODIUM 40 MG PO TBEC
40.0000 mg | DELAYED_RELEASE_TABLET | Freq: Every day | ORAL | Status: DC
  Administered 2024-03-29: 40 mg via ORAL
  Filled 2024-03-28: qty 1

## 2024-03-28 MED ORDER — POLYETHYLENE GLYCOL 3350 17 G PO PACK: 17.0000 g | PACK | Freq: Every day | ORAL | Status: DC | PRN

## 2024-03-28 MED ORDER — GABAPENTIN 300 MG PO CAPS
300.0000 mg | ORAL_CAPSULE | Freq: Two times a day (BID) | ORAL | Status: DC
  Administered 2024-03-28 – 2024-03-29 (×2): 300 mg via ORAL
  Filled 2024-03-28 (×2): qty 1

## 2024-03-28 MED ORDER — MELATONIN 3 MG PO TABS
6.0000 mg | ORAL_TABLET | Freq: Every evening | ORAL | Status: DC | PRN
Start: 2024-03-28 — End: 2024-03-29

## 2024-03-28 MED ORDER — ROSUVASTATIN CALCIUM 20 MG PO TABS
40.0000 mg | ORAL_TABLET | Freq: Every day | ORAL | Status: DC
  Administered 2024-03-28: 40 mg via ORAL
  Filled 2024-03-28: qty 2

## 2024-03-28 MED ORDER — ENOXAPARIN SODIUM 60 MG/0.6ML IJ SOSY
0.5000 mg/kg | PREFILLED_SYRINGE | INTRAMUSCULAR | Status: DC
  Administered 2024-03-28: 47.5 mg via SUBCUTANEOUS
  Filled 2024-03-28: qty 0.6

## 2024-03-28 MED ORDER — DIVALPROEX SODIUM 125 MG PO DR TAB
125.0000 mg | DELAYED_RELEASE_TABLET | Freq: Every day | ORAL | Status: DC
  Administered 2024-03-28: 125 mg via ORAL
  Filled 2024-03-28 (×2): qty 1

## 2024-03-28 NOTE — ED Notes (Signed)
 Pt wife states that pt was eating dinner when he began having a blank stare and began playing with his food. Pt wife states pt was not able to follow commands and would not stand or raise any of his extremities upon request. Upon arrival to the hospital, this RN observed pt is alert and oriented x4 and NIH is a 0. No facial droop, no weakness or numbness. Answers questions appropriately. PA at bedside at this time.

## 2024-03-28 NOTE — ED Notes (Signed)
 Pt given water

## 2024-03-28 NOTE — ED Triage Notes (Signed)
 Pt. From home with wife, BIB [facility]. He was eating dinner with wife around 1730 and he began to be confused, playing with his food and was able to hold things. Wife notes he has had 2 strokes and 3 heart attacks, and multiple blockages in his brain. Pt. Is now alert and oriented but is confused about what has happened.

## 2024-03-28 NOTE — ED Provider Notes (Signed)
 [place] EMERGENCY DEPARTMENT AT [facility] Provider Note   CSN: [id number] Arrival date & time: 03/28/24  8167     Patient presents with: Altered Mental Status   Jeremy [name] is a 78 y.o. male.   Patient is a 78 year old male who presents emergency department with his family who notes that the patient developed an abrupt onset of altered mental status while at dinner this evening.  Wife notes that he was eating dinner when he suddenly became altered and had difficulty with coordination.  She notes that he was playing with his food like a toddler would do.  She notes that she did contact EMS and did a stroke screen per their request which was unremarkable but patient had difficulty with moving his arms.  Patient was unable to ambulate during the event as well.  He does have a history of MI as well as CVA.  On presentation to the emergency department the patient has returned back to his baseline.  He denies any active chest pain, shortness of breath, abdominal pain, nausea, vomiting, diarrhea, numbness, paresthesias, unilateral weakness.  He denies any dizziness, lightheadedness and there was no associated syncope.   Altered Mental Status Presenting symptoms: confusion        Prior to Admission medications   Medication Sig Start Date End Date Taking? Authorizing Provider  acetaminophen  (TYLENOL ) 500 MG tablet Take 500 mg by mouth every 6 (six) hours as needed for mild pain.   Yes [provider]  Alcaftadine (LASTACAFT) 0.25 % SOLN Place 1 drop into both eyes as needed (dry eyes).   Yes [provider]  ALLERGY RELIEF 180 MG tablet Take 180 mg by mouth every morning. 03/08/24  Yes [provider]  aspirin  EC 81 MG EC tablet Take 1 tablet (81 mg total) by mouth daily. 03/28/19  Yes Furth, Cadence H, PA-C  carvedilol  (COREG ) 6.25 MG tablet Take 1 tablet (6.25 mg total) by mouth 2 (two) times daily with a meal. 11/07/23  Yes Delford Maude BROCKS, MD   clopidogrel  (PLAVIX ) 75 MG tablet Take 1 tablet by mouth once daily Patient taking differently: Take 75 mg by mouth at bedtime. 03/04/24  Yes Delford Maude BROCKS, MD  divalproex  (DEPAKOTE ) 125 MG DR tablet Take 1 tablet (125 mg total) by mouth at bedtime. 10/09/23  Yes McCue, Harlene, NP  gabapentin  (NEURONTIN ) 300 MG capsule Take 1 capsule (300 mg total) by mouth 3 (three) times daily. Patient taking differently: Take 300 mg by mouth 2 (two) times daily. 08/21/18  Yes Rosemarie Eather RAMAN, MD  isosorbide  mononitrate (IMDUR ) 30 MG 24 hr tablet TAKE 1 TABLET BY MOUTH ONCE DAILY - MUST KEEP FOLLOW UP APPT IN JULY 2025 FOR MORE REFILLS Patient taking differently: Take 30 mg by mouth at bedtime. 02/06/24  Yes Nishan, Peter C, MD  ketoconazole (NIZORAL) 2 % shampoo Apply 1 Application topically once a week.   Yes [provider]  losartan  (COZAAR ) 100 MG tablet Take 1 tablet (100 mg total) by mouth daily. 11/07/23  Yes Nishan, Peter C, MD  methocarbamol (ROBAXIN) 500 MG tablet Take 500 mg by mouth 3 (three) times daily as needed for muscle spasms. 11/03/23  Yes [provider]  montelukast (SINGULAIR) 10 MG tablet Take 10 mg by mouth at bedtime as needed (allergies). 09/07/23  Yes [provider]  nitroGLYCERIN  (NITROSTAT ) 0.4 MG SL tablet Place 1 tablet (0.4 mg total) under the tongue every 5 (five) minutes as needed for chest pain.  10/05/20  Yes Nishan, Peter C, MD  nystatin  cream (MYCOSTATIN ) Apply 1 Application topically daily as needed (rash).   Yes [provider]  NYSTATIN  powder Apply 1 Application topically daily as needed (rash).   Yes [provider]  oxyCODONE  (ROXICODONE ) 15 MG immediate release tablet Take 15 mg by mouth every 6 (six) hours as needed for pain. 10/18/19  Yes [provider]  pantoprazole  (PROTONIX ) 40 MG tablet Take 1 tablet (40 mg total) by mouth daily. 01/14/22  Yes Dunn, Dayna N, PA-C  ranolazine  (RANEXA ) 500 MG 12 hr tablet Take 1  tablet (500 mg total) by mouth 2 (two) times daily. 01/24/24  Yes Delford Maude BROCKS, MD  rosuvastatin  (CRESTOR ) 40 MG tablet Take 40 mg by mouth at bedtime. 10/30/19  Yes [provider]    Allergies: Codeine, Oxycontin  [oxycodone  hcl], Contrast media [iodinated contrast media], and Morphine  and codeine    Review of Systems  Psychiatric/Behavioral:  Positive for confusion.   All other systems reviewed and are negative.   Updated Vital Signs BP (!) 128/93   Pulse 62   Temp 97.8 F (36.6 C) (Oral)   Resp 17   Ht 5' 7 (1.702 m)   Wt 96.2 kg   SpO2 96%   BMI 33.20 kg/m   Physical Exam Vitals and nursing note reviewed.  Constitutional:      General: He is not in acute distress.    Appearance: Normal appearance. He is not ill-appearing.  HENT:     Head: Normocephalic and atraumatic.     Nose: Nose normal.     Mouth/Throat:     Mouth: Mucous membranes are moist.  Eyes:     Extraocular Movements: Extraocular movements intact.     Conjunctiva/sclera: Conjunctivae normal.     Pupils: Pupils are equal, round, and reactive to light.  Cardiovascular:     Rate and Rhythm: Normal rate and regular rhythm.     Pulses: Normal pulses.     Heart sounds: Normal heart sounds. No murmur heard.    No gallop.  Pulmonary:     Effort: Pulmonary effort is normal. No respiratory distress.     Breath sounds: Normal breath sounds. No stridor. No wheezing, rhonchi or rales.  Abdominal:     General: Abdomen is flat. Bowel sounds are normal. There is no distension.     Palpations: Abdomen is soft.     Tenderness: There is no abdominal tenderness. There is no guarding.  Musculoskeletal:        General: Normal range of motion.     Cervical back: Normal range of motion and neck supple. No rigidity or tenderness.     Right lower leg: No edema.     Left lower leg: No edema.  Skin:    General: Skin is warm and dry.     Findings: No bruising or rash.  Neurological:     General: No focal deficit  present.     Mental Status: He is alert and oriented to person, place, and time. Mental status is at baseline.     Cranial Nerves: No cranial nerve deficit.     Sensory: No sensory deficit.     Motor: No weakness.     Coordination: Coordination normal.     Gait: Gait normal.  Psychiatric:        Mood and Affect: Mood normal.        Behavior: Behavior normal.        Thought Content: Thought content  normal.        Judgment: Judgment normal.     (all labs ordered are listed, but only abnormal results are displayed) Labs Reviewed  COMPREHENSIVE METABOLIC PANEL WITH GFR - Abnormal; Notable for the following components:      Result Value   Creatinine, Ser 1.79 (*)    GFR, Estimated 38 (*)    All other components within normal limits  TROPONIN T, HIGH SENSITIVITY - Abnormal; Notable for the following components:   Troponin T High Sensitivity 24 (*)    All other components within normal limits  CBC WITH DIFFERENTIAL/PLATELET  URINALYSIS, ROUTINE W REFLEX MICROSCOPIC  TROPONIN T, HIGH SENSITIVITY    EKG: EKG Interpretation Date/Time:  Thursday March 28 2024 19:11:01 EST Ventricular Rate:  64 PR Interval:  191 QRS Duration:  170 QT Interval:  453 QTC Calculation: 468 R Axis:   58  Text Interpretation: Sinus rhythm Prolonged PR interval IVCD, consider atypical LBBB Confirmed by Franklyn Gills [phone]) on 03/28/2024 7:25:59 PM  Radiology: CT Head Wo Contrast Result Date: 03/28/2024 CLINICAL DATA:  Altered mental status. EXAM: CT HEAD WITHOUT CONTRAST TECHNIQUE: Contiguous axial images were obtained from the base of the skull through the vertex without intravenous contrast. RADIATION DOSE REDUCTION: This exam was performed according to the departmental dose-optimization program which includes automated exposure control, adjustment of the mA and/or kV according to patient size and/or use of iterative reconstruction technique. COMPARISON:  June 01, 2023 FINDINGS: Brain: There is  generalized cerebral atrophy with widening of the extra-axial spaces and ventricular dilatation. There are areas of decreased attenuation within the white matter tracts of the supratentorial brain, consistent with microvascular disease changes. A chronic right occipital lobe infarct is seen. Vascular: Marked severity bilateral cavernous carotid artery calcification is noted. Skull: Normal. Negative for fracture or focal lesion. Sinuses/Orbits: No acute finding. Other: None. IMPRESSION: 1. Generalized cerebral atrophy with chronic white matter small vessel ischemic changes. 2. Chronic right occipital lobe infarct. 3. No acute intracranial abnormality. Electronically Signed   By: Suzen Dials M.D.   On: 03/28/2024 20:30   DG Chest Port 1 View Result Date: 03/28/2024 CLINICAL DATA:  Weakness. EXAM: PORTABLE CHEST 1 VIEW COMPARISON:  Chest radiograph dated 11/25/2023. FINDINGS: Shallow inspiration. Left lung base atelectasis. Pneumonia is not excluded the right lung base. No pleural effusion pneumothorax. The cardiac silhouette is within normal limits. Atherosclerotic calcification of the aorta. No acute osseous pathology. IMPRESSION: Left lung base atelectasis. Pneumonia is not excluded. Electronically Signed   By: Vanetta Chou M.D.   On: 03/28/2024 20:06     Procedures   Medications Ordered in the ED - No data to display                                  Medical Decision Making Amount and/or Complexity of Data Reviewed Labs: ordered. Radiology: ordered.  Risk Decision regarding hospitalization.   This patient presents to the ED for concern of altered mental status, changes in coordination, this involves an extensive number of treatment options, and is a complaint that carries with it a high risk of complications and morbidity.  The differential diagnosis includes CVA, TIA, electrolyte derangement, acute kidney injury, dehydration, ACS   Co morbidities that complicate the patient  evaluation  CVA, MI   Additional history obtained:  Additional history obtained from family External records from outside source obtained and reviewed including medical records   Lab  Tests:  I Ordered, and personally interpreted labs.  The pertinent results include: No leukocytosis, no anemia, creatinine at baseline, normal liver function, unremarkable electrolytes, mild elevation of troponin   Imaging Studies ordered:  I ordered imaging studies including CT scan head, chest x-ray I independently visualized and interpreted imaging which showed no acute intracranial process, no acute cardiopulmonary process I agree with the radiologist interpretation   Cardiac Monitoring: / EKG:  The patient was maintained on a cardiac monitor.  I personally viewed and interpreted the cardiac monitored which showed an underlying rhythm of: Sinus rhythm, left bundle branch block, no ST/T wave changes, no ischemic changes, no STEMI, EKG consistent with previous   Consultations Obtained:  I requested consultation with the hospitalist,  and discussed lab and imaging findings as well as pertinent plan - they recommend: Admission   Problem List / ED Course / Critical interventions / Medication management  Patient is doing well at this time and does remain stable.  Discussed with patient and family that we will plan for admission to the hospitalist service for TIA rule out given his unexplained event this evening and his long history of MI and CVA.  Blood work has been overall unremarkable.  He is back to his baseline at this time with no focal deficits on exam.  Do not suspect that emergent neuro consult or TNKase is warranted.  Patient had an EKG with no acute ischemic changes and is consistent with previous.  He does have no chest pain or shortness of breath at this time.  Do not suspect acute ACS.  Have discussed patient case with Dr. Shona with the hospitalist service who has excepted for  admission.   Social Determinants of Health:  None   Test / Admission - Considered:  Admission     Final diagnoses:  Transient alteration of awareness    ED Discharge Orders     None          Daralene Lonni JONETTA DEVONNA 03/28/24 2132    Franklyn Sid SAILOR, MD 03/30/24 [phone]

## 2024-03-28 NOTE — H&P (Signed)
 History and Physical  Jeremy [name] FMW:[id number] DOB: [birth year] DOA: 03/28/2024  Referring physician: Daralene Bruckner, PA-EDP  PCP: [name], MD  Outpatient Specialists: Neurology. Patient coming from: Home.  Chief Complaint: Sudden onset altered mental status.  HPI: Jeremy [name] is a 78 y.o. male with medical history significant for coronary artery disease status post stenting of LAD and RCA, right carotid stenosis, prior CVA, seizure disorder, hypertension, hyperlipidemia, BPH, CKD 3B, who presents to the ER due to sudden onset altered mental status.  The patient was sitting at the table eating dinner around 1730 when suddenly he began to have a blank stare and started to play with his food.  He had difficulty with coordination and was not able to stand or follow commands.  In the ER, the patient was back to his baseline.  He is alert and oriented x 4, with an NIH of 0.  A noncontrast head CT was non-acute.  EDP requesting admission for TIA workup.  ED Course: Temperature 97.8.  BP 147/68, pulse 64, respiratory rate 18, O2 saturation 98% on room air.  Review of Systems: Review of systems as noted in the HPI. All other systems reviewed and are negative.   Past Medical History:  Diagnosis Date   Anxiety    Arthritis    Basal cell carcinoma 02/11/2021   nod- left dorsal hand (EXC)   CAD (coronary artery disease)    a. s/p prior stenting of LAD and RCA b. 03/2019: NSTEMI and required PTCA/DESx1 to the LAD and successful scoring balloon angioplasty in the previously stented segment of LCx   Carotid stenosis, asymptomatic, right    Carpal tunnel syndrome    bilateral   Coronary artery disease    GERD (gastroesophageal reflux disease)    History of kidney stones    Hx of colonic polyp    Hypercholesterolemia    Hypertension    Myocardial infarction (HCC)    hx of   Obesity    SCC (squamous cell carcinoma) 02/11/2021   in situ- right temple ([id number])   Squamous cell  carcinoma of skin 08/09/2016   well differentiated on right outer eye - tx p bx   Stroke [facility])    Past Surgical History:  Procedure Laterality Date   CARDIAC CATHETERIZATION     CATARACT EXTRACTION W/PHACO Left 03/12/2021   Procedure: CATARACT EXTRACTION PHACO AND INTRAOCULAR LENS PLACEMENT (IOC);  Surgeon: Harrie Agent, MD;  Location: [facility];  Service: Ophthalmology;  Laterality: Left;   CDE: 14.58   CATARACT EXTRACTION W/PHACO Right 04/08/2021   Procedure: CATARACT EXTRACTION PHACO AND INTRAOCULAR LENS PLACEMENT RIGHT EYE;  Surgeon: Harrie Agent, MD;  Location: [facility];  Service: Ophthalmology;  Laterality: Right;  CDE 10.94   CORONARY BALLOON ANGIOPLASTY N/A 03/26/2019   Procedure: CORONARY BALLOON ANGIOPLASTY;  Surgeon: Verlin Bruckner BIRCH, MD;  Location: [facility];  Service: Cardiovascular;  Laterality: N/A;   CORONARY STENT INTERVENTION N/A 03/26/2019   Procedure: CORONARY STENT INTERVENTION;  Surgeon: Verlin Bruckner BIRCH, MD;  Location: [facility];  Service: Cardiovascular;  Laterality: N/A;   CORONARY STENT INTERVENTION N/A 11/15/2021   Procedure: CORONARY STENT INTERVENTION;  Surgeon: Court Dorn PARAS, MD;  Location: [facility];  Service: Cardiovascular;  Laterality: N/A;   CORONARY STENT PLACEMENT  2000   x3    CYST EXCISION     x 2   ENDARTERECTOMY Right 12/25/2017   Procedure: ENDARTERECTOMY CAROTID RIGHT;  Surgeon: Oris Krystal FALCON, MD;  Location: [facility];  Service: Vascular;  Laterality: Right;   LEFT HEART CATH AND CORONARY ANGIOGRAPHY N/A 03/26/2019   Procedure: LEFT HEART CATH AND CORONARY ANGIOGRAPHY;  Surgeon: Verlin Lonni BIRCH, MD;  Location: [facility];  Service: Cardiovascular;  Laterality: N/A;   LEFT HEART CATH AND CORONARY ANGIOGRAPHY N/A 11/15/2021   Procedure: LEFT HEART CATH AND CORONARY ANGIOGRAPHY;  Surgeon: Court Dorn PARAS, MD;  Location: [facility];  Service: Cardiovascular;  Laterality: N/A;   PATCH  ANGIOPLASTY Right 12/25/2017   Procedure: PATCH ANGIOPLASTY RIGHT CAROTID ARTERY;  Surgeon: Oris Krystal FALCON, MD;  Location: [facility];  Service: Vascular;  Laterality: Right;   TEAR DUCT PROBING Bilateral 07/2020   done at [facility]. Dr. Greig Gay   TEE WITHOUT CARDIOVERSION N/A 06/29/2018   Procedure: TRANSESOPHAGEAL ECHOCARDIOGRAM (TEE);  Surgeon: Okey Vina GAILS, MD;  Location: [facility];  Service: Cardiovascular;  Laterality: N/A;    Social History:  reports that he quit smoking about 35 years ago. His smoking use included cigarettes. He started smoking about 55 years ago. He has a 40 pack-year smoking history. He has never used smokeless tobacco. He reports that he does not drink alcohol  and does not use drugs.   Allergies  Allergen Reactions   Codeine Anaphylaxis   Oxycontin  [Oxycodone  Hcl] Itching    Through IV- has since had oral oxycodone  without issue   Contrast Media [Iodinated Contrast Media] Nausea Only    Patient got very nauseated. Gave 4mg  Zofran . Possibly pre medicate with Zofran     Morphine  And Codeine Itching    Family History  Problem Relation Age of Onset   Coronary artery disease Mother 13       deceased   Hypertension Mother    Other Father 102       deceased old age   Other Sister 83       living and healthy   Coronary artery disease Brother    Skin cancer Brother    Colon cancer Neg Hx    Pancreatic cancer Neg Hx    Stomach cancer Neg Hx    Esophageal cancer Neg Hx    Liver disease Neg Hx       Prior to Admission medications   Medication Sig Start Date End Date Taking? Authorizing Provider  acetaminophen  (TYLENOL ) 500 MG tablet Take 500 mg by mouth every 6 (six) hours as needed for mild pain.   Yes [provider]  Alcaftadine (LASTACAFT) 0.25 % SOLN Place 1 drop into both eyes as needed (dry eyes).   Yes [provider]  ALLERGY RELIEF 180 MG tablet Take 180 mg by mouth every morning. 03/08/24  Yes [provider]  aspirin  EC 81  MG EC tablet Take 1 tablet (81 mg total) by mouth daily. 03/28/19  Yes Furth, Cadence H, PA-C  carvedilol  (COREG ) 6.25 MG tablet Take 1 tablet (6.25 mg total) by mouth 2 (two) times daily with a meal. 11/07/23  Yes Delford Maude BROCKS, MD  clopidogrel  (PLAVIX ) 75 MG tablet Take 1 tablet by mouth once daily Patient taking differently: Take 75 mg by mouth at bedtime. 03/04/24  Yes Delford Maude BROCKS, MD  divalproex  (DEPAKOTE ) 125 MG DR tablet Take 1 tablet (125 mg total) by mouth at bedtime. 10/09/23  Yes McCue, Harlene, NP  gabapentin  (NEURONTIN ) 300 MG capsule Take 1 capsule (300 mg total) by mouth 3 (three) times daily. Patient taking differently: Take 300 mg by mouth 2 (two) times daily. 08/21/18  Yes Rosemarie Eather RAMAN, MD  isosorbide  mononitrate (IMDUR ) 30 MG 24 hr tablet TAKE 1 TABLET BY MOUTH ONCE DAILY - MUST KEEP FOLLOW UP APPT IN JULY 2025 FOR MORE REFILLS Patient taking differently: Take 30 mg by mouth at bedtime. 02/06/24  Yes Nishan, Peter C, MD  ketoconazole (NIZORAL) 2 % shampoo Apply 1 Application topically once a week.   Yes [provider]  losartan  (COZAAR ) 100 MG tablet Take 1 tablet (100 mg total) by mouth daily. 11/07/23  Yes Nishan, Peter C, MD  methocarbamol (ROBAXIN) 500 MG tablet Take 500 mg by mouth 3 (three) times daily as needed for muscle spasms. 11/03/23  Yes [provider]  montelukast (SINGULAIR) 10 MG tablet Take 10 mg by mouth at bedtime as needed (allergies). 09/07/23  Yes [provider]  nitroGLYCERIN  (NITROSTAT ) 0.4 MG SL tablet Place 1 tablet (0.4 mg total) under the tongue every 5 (five) minutes as needed for chest pain. 10/05/20  Yes Nishan, Peter C, MD  nystatin  cream (MYCOSTATIN ) Apply 1 Application topically daily as needed (rash).   Yes [provider]  NYSTATIN  powder Apply 1 Application topically daily as needed (rash).   Yes [provider]  oxyCODONE  (ROXICODONE ) 15 MG immediate release tablet Take 15 mg by mouth every 6  (six) hours as needed for pain. 10/18/19  Yes [provider]  pantoprazole  (PROTONIX ) 40 MG tablet Take 1 tablet (40 mg total) by mouth daily. 01/14/22  Yes Dunn, Dayna N, PA-C  ranolazine  (RANEXA ) 500 MG 12 hr tablet Take 1 tablet (500 mg total) by mouth 2 (two) times daily. 01/24/24  Yes Delford Maude BROCKS, MD  rosuvastatin  (CRESTOR ) 40 MG tablet Take 40 mg by mouth at bedtime. 10/30/19  Yes [provider]    Physical Exam: BP (!) 128/93   Pulse 62   Temp 97.8 F (36.6 C) (Oral)   Resp 17   Ht 5' 7 (1.702 m)   Wt 96.2 kg   SpO2 96%   BMI 33.20 kg/m   General: 78 y.o. year-old male well developed well nourished in no acute distress.  Alert and oriented x3. Cardiovascular: Regular rate and rhythm with no rubs or gallops.  No thyromegaly or JVD noted.  No lower extremity edema. 2/4 pulses in all 4 extremities. Respiratory: Clear to auscultation with no wheezes or rales. Good inspiratory effort. Abdomen: Soft nontender nondistended with normal bowel sounds x4 quadrants. Muskuloskeletal: No cyanosis, clubbing or edema noted bilaterally Neuro: CN II-XII intact, strength, sensation, reflexes Skin: No ulcerative lesions noted or rashes Psychiatry: Judgement and insight appear normal. Mood is appropriate for condition and setting          Labs on Admission:  Basic Metabolic Panel: Recent Labs  Lab 03/28/24 2016  NA 140  K 4.2  CL 103  CO2 26  GLUCOSE 86  BUN 22  CREATININE 1.79*  CALCIUM  9.2   Liver Function Tests: Recent Labs  Lab 03/28/24 2016  AST 20  ALT 12  ALKPHOS 60  BILITOT 0.5  PROT 6.9  ALBUMIN 4.3   No results for input(s): LIPASE, AMYLASE in the last 168 hours. No results for input(s): AMMONIA in the last 168 hours. CBC: Recent Labs  Lab 03/28/24 1913  WBC 7.0  NEUTROABS 4.6  HGB 13.2  HCT 40.8  MCV 89.5  PLT 217   Cardiac Enzymes: No results for input(s): CKTOTAL, CKMB, CKMBINDEX, TROPONINI in the last 168  hours.  BNP (last 3 results) No results for  input(s): BNP in the last 8760 hours.  ProBNP (last 3 results) No results for input(s): PROBNP in the last 8760 hours.  CBG: No results for input(s): GLUCAP in the last 168 hours.  Radiological Exams on Admission: CT Head Wo Contrast Result Date: 03/28/2024 CLINICAL DATA:  Altered mental status. EXAM: CT HEAD WITHOUT CONTRAST TECHNIQUE: Contiguous axial images were obtained from the base of the skull through the vertex without intravenous contrast. RADIATION DOSE REDUCTION: This exam was performed according to the departmental dose-optimization program which includes automated exposure control, adjustment of the mA and/or kV according to patient size and/or use of iterative reconstruction technique. COMPARISON:  June 01, 2023 FINDINGS: Brain: There is generalized cerebral atrophy with widening of the extra-axial spaces and ventricular dilatation. There are areas of decreased attenuation within the white matter tracts of the supratentorial brain, consistent with microvascular disease changes. A chronic right occipital lobe infarct is seen. Vascular: Marked severity bilateral cavernous carotid artery calcification is noted. Skull: Normal. Negative for fracture or focal lesion. Sinuses/Orbits: No acute finding. Other: None. IMPRESSION: 1. Generalized cerebral atrophy with chronic white matter small vessel ischemic changes. 2. Chronic right occipital lobe infarct. 3. No acute intracranial abnormality. Electronically Signed   By: Suzen Dials M.D.   On: 03/28/2024 20:30   DG Chest Port 1 View Result Date: 03/28/2024 CLINICAL DATA:  Weakness. EXAM: PORTABLE CHEST 1 VIEW COMPARISON:  Chest radiograph dated 11/25/2023. FINDINGS: Shallow inspiration. Left lung base atelectasis. Pneumonia is not excluded the right lung base. No pleural effusion pneumothorax. The cardiac silhouette is within normal limits. Atherosclerotic calcification of the aorta. No  acute osseous pathology. IMPRESSION: Left lung base atelectasis. Pneumonia is not excluded. Electronically Signed   By: Vanetta Chou M.D.   On: 03/28/2024 20:06    EKG: I independently viewed the EKG done and my findings are as followed: Sinus rhythm rate of 64.  Nonspecific ST-T changes.  QTc 468.  Assessment/Plan Present on Admission:  TIA (transient ischemic attack)  Principal Problem:   TIA (transient ischemic attack)  TIA versus acute CVA History of prior CVA, right occipital lobe infarct seen on noncontrast CT head. Sudden onset change in mental status, rule out CVA versus TIA. Symptoms are currently resolved. Noncontrast head CT was nonacute and showed chronic right occipital lobe infarct. Follow MRI brain, MRA head and neck without contrast. Frequent neurochecks Follow transthoracic echocardiogram Follow fasting lipid panel, A1c PT/OT/speech therapy evaluation Continue home DAPT Continue home Crestor  Continue telemetry monitoring Consider neurology consultation in the morning.  CKD 3B Presented with creatinine 1.79 with GFR of 38 which appears to be at his baseline. Avoid nephrotoxic agents, dehydration, and hypotension Monitor urine output Repeat BMP in the morning.  Seizure disorder Resume home regimen Seizure precautions  Elevated troponin Suspect demand ischemia Initial troponin 24, repeat troponin is pending. No evidence of acute ischemia on twelve-lead EKG.  Chronic HFpEF Last 2D echo done on 11/14/2021 revealed LVEF 60 to 65% with grade 1 diastolic dysfunction. Monitor strict I's and O's and daily weight.  Coronary artery disease status post PCI with stenting No reported anginal symptoms. Resume home DAPT, aspirin  and Plavix  Resume home Crestor .  Obesity BMI 33 Recommend weight loss outpatient with regular physical activity and healthy dieting.   Time: 75 minutes.   DVT prophylaxis: Subcu Lovenox daily.  Code Status: Full code.  Family  Communication: None at bedside.  Disposition Plan: Admitted to telemetry unit.  Consults called: None.  Admission status: Observation status.  Status is: Observation    Terry LOISE Hurst MD [facility] Pager [phone]  If 7PM-7AM, please contact night-coverage [email or web address] Password [email or web address]  03/28/2024, 10:08 PM

## 2024-03-29 ENCOUNTER — Telehealth: Payer: Self-pay | Admitting: *Deleted

## 2024-03-29 ENCOUNTER — Observation Stay (HOSPITAL_COMMUNITY)

## 2024-03-29 ENCOUNTER — Other Ambulatory Visit (HOSPITAL_COMMUNITY): Payer: Self-pay | Admitting: *Deleted

## 2024-03-29 ENCOUNTER — Encounter (HOSPITAL_COMMUNITY): Payer: Self-pay | Admitting: Internal Medicine

## 2024-03-29 DIAGNOSIS — I6613 Occlusion and stenosis of bilateral anterior cerebral arteries: Secondary | ICD-10-CM | POA: Diagnosis not present

## 2024-03-29 DIAGNOSIS — G459 Transient cerebral ischemic attack, unspecified: Secondary | ICD-10-CM

## 2024-03-29 LAB — BASIC METABOLIC PANEL WITH GFR
Anion gap: 8 (ref 5–15)
BUN: 23 mg/dL (ref 8–23)
CO2: 26 mmol/L (ref 22–32)
Calcium: 8.5 mg/dL — ABNORMAL LOW (ref 8.9–10.3)
Chloride: 105 mmol/L (ref 98–111)
Creatinine, Ser: 1.76 mg/dL — ABNORMAL HIGH (ref 0.61–1.24)
GFR, Estimated: 39 mL/min — ABNORMAL LOW (ref >60)
Glucose, Bld: 92 mg/dL (ref 70–99)
Potassium: 3.7 mmol/L (ref 3.5–5.1)
Sodium: 140 mmol/L (ref 135–145)

## 2024-03-29 LAB — ECHOCARDIOGRAM COMPLETE
AR max vel: 2.31 cm2
AV Area VTI: 2.26 cm2
AV Area mean vel: 2.28 cm2
AV Mean grad: 7 mmHg
AV Peak grad: 12.1 mmHg
Ao pk vel: 1.74 m/s
Area-P 1/2: 3.21 cm2
Height: 67 in
S' Lateral: 2.9 cm
Weight: 3392 [oz_av]

## 2024-03-29 LAB — URINALYSIS, ROUTINE W REFLEX MICROSCOPIC
Bacteria, UA: NONE SEEN
Bilirubin Urine: NEGATIVE
Glucose, UA: NEGATIVE mg/dL
Hgb urine dipstick: NEGATIVE
Ketones, ur: NEGATIVE mg/dL
Leukocytes,Ua: NEGATIVE
Nitrite: NEGATIVE
Protein, ur: 30 mg/dL — AB
Specific Gravity, Urine: 1.021 (ref 1.005–1.030)
pH: 5 (ref 5.0–8.0)

## 2024-03-29 LAB — PHOSPHORUS: Phosphorus: 3.4 mg/dL (ref 2.5–4.6)

## 2024-03-29 LAB — LIPID PANEL
Cholesterol: 90 mg/dL (ref 0–200)
HDL: 33 mg/dL — ABNORMAL LOW (ref >40)
LDL Cholesterol: 19 mg/dL (ref 0–99)
Total CHOL/HDL Ratio: 2.7 ratio
Triglycerides: 189 mg/dL — ABNORMAL HIGH (ref <150)
VLDL: 38 mg/dL (ref 0–40)

## 2024-03-29 LAB — CBC
HCT: 35.1 % — ABNORMAL LOW (ref 39.0–52.0)
Hemoglobin: 11.4 g/dL — ABNORMAL LOW (ref 13.0–17.0)
MCH: 28.8 pg (ref 26.0–34.0)
MCHC: 32.5 g/dL (ref 30.0–36.0)
MCV: 88.6 fL (ref 80.0–100.0)
Platelets: 192 K/uL (ref 150–400)
RBC: 3.96 MIL/uL — ABNORMAL LOW (ref 4.22–5.81)
RDW: 13.9 % (ref 11.5–15.5)
WBC: 5.9 K/uL (ref 4.0–10.5)
nRBC: 0 % (ref 0.0–0.2)

## 2024-03-29 LAB — HEMOGLOBIN A1C
Hgb A1c MFr Bld: 5.1 % (ref 4.8–5.6)
Mean Plasma Glucose: 99.67 mg/dL

## 2024-03-29 LAB — MAGNESIUM: Magnesium: 2.2 mg/dL (ref 1.7–2.4)

## 2024-03-29 MED ORDER — ORAL CARE MOUTH RINSE: 15.0000 mL | OROMUCOSAL | Status: DC | PRN

## 2024-03-29 MED ORDER — ENOXAPARIN SODIUM 40 MG/0.4ML IJ SOSY: 40.0000 mg | PREFILLED_SYRINGE | INTRAMUSCULAR | Status: DC

## 2024-03-29 MED ORDER — ASPIRIN 81 MG PO TBEC
81.0000 mg | DELAYED_RELEASE_TABLET | Freq: Every day | ORAL | Status: DC
  Filled 2024-03-29: qty 1

## 2024-03-29 NOTE — Telephone Encounter (Signed)
-----   Message from Jeremy [name] sent at 03/29/2024  3:40 PM EST ----- Regarding: 30-day monitor This patient needs a 30-day monitor per Dr. Bryn for TIA and history of CVA. Established patient of Dr. Delford.  Thanks,  Brittany

## 2024-03-29 NOTE — Discharge Summary (Signed)
 Physician Discharge Summary   Patient: Jeremy [name] MRN: [id number] DOB: 06-24-1945  Admit date:     03/28/2024  Discharge date: 03/29/24  Discharge Physician: Bernardino KATHEE Come   PCP: [name], MD   Recommendations at discharge:  Follow up with neurology soon for reevaluation. TIA work up including MRI and MRA showed no stroke and benign echocardiogram. The episode prior to presentation may represent a partial seizure, though no AED modifications were made due to significant intolerances in the past and diagnostic uncertainty. Return precautions discussed. Cardiology will arrange cardiac monitoring after discharge PCP follow up in 1-2 weeks with repeat BMP is recommended.  Discharge Diagnoses: Principal Problem:   TIA (transient ischemic attack)  Hospital Course: HPI: Jeremy [name] is a 78 y.o. male with medical history significant for coronary artery disease status post stenting of LAD and RCA, right carotid stenosis status post CEA, prior CVA with no focal deficits, hypertension, hyperlipidemia, BPH, CKD 3B, seizures who presents to the ER due to sudden onset altered mental status.  The patient was sitting at the table eating dinner around 1730 when suddenly he began to have a blank stare and started to play with his food.  He had difficulty with coordination and was not able to stand or follow commands. The patient recalls all events, and was responsive but felt unable to move extremities. This lasted about 20 minutes after which he returned to his baseline. There was no report of gradual return to baseline, or confusion, nothing to suggest postictal state. No tremors noted.    In the ER, the patient was back to his baseline.  He is alert and oriented x 4, with an NIH of 0.  A noncontrast head CT was non-acute.  EDP requesting admission for TIA workup.   ED Course: Temperature 97.8.  BP 147/68, pulse 64, respiratory rate 18, O2 saturation 98% on room air.  Hospital Course: Remained at  baseline throughout observation period. Echo was reassuring, MRI and MRA showed no emergent LVO or acute stroke. Case was informally discussed with neurology, who based on our discussions felt it was reasonable to proceed with the plan of continuing medications and expedited follow up with his well-established outpatient neurologist. Precautions were discussed with patient.   Consultants: No formal consultations Procedures performed: Echo  Disposition: Home Diet recommendation: Heart healthy DISCHARGE MEDICATION: Allergies as of 03/29/2024       Reactions   Codeine Anaphylaxis   Oxycontin  [oxycodone  Hcl] Itching   Through IV- has since had oral oxycodone  without issue   Contrast Media [iodinated Contrast Media] Nausea Only   Patient got very nauseated. Gave 4mg  Zofran . Possibly pre medicate with Zofran     Morphine  And Codeine Itching        Medication List     TAKE these medications    acetaminophen  500 MG tablet Commonly known as: TYLENOL  Take 500 mg by mouth every 6 (six) hours as needed for mild pain.   Allergy Relief 180 MG tablet Generic drug: fexofenadine Take 180 mg by mouth every morning.   aspirin  EC 81 MG tablet Take 1 tablet (81 mg total) by mouth daily.   carvedilol  6.25 MG tablet Commonly known as: COREG  Take 1 tablet (6.25 mg total) by mouth 2 (two) times daily with a meal.   clopidogrel  75 MG tablet Commonly known as: PLAVIX  Take 1 tablet by mouth once daily What changed: when to take this   divalproex  125 MG DR tablet Commonly known as: DEPAKOTE   Take 1 tablet (125 mg total) by mouth at bedtime.   gabapentin  300 MG capsule Commonly known as: NEURONTIN  Take 1 capsule (300 mg total) by mouth 3 (three) times daily. What changed: when to take this   isosorbide  mononitrate 30 MG 24 hr tablet Commonly known as: IMDUR  TAKE 1 TABLET BY MOUTH ONCE DAILY - MUST KEEP FOLLOW UP APPT IN JULY 2025 FOR MORE REFILLS What changed: See the new instructions.    ketoconazole 2 % shampoo Commonly known as: NIZORAL Apply 1 Application topically once a week.   Lastacaft 0.25 % Soln Generic drug: Alcaftadine Place 1 drop into both eyes as needed (dry eyes).   losartan  100 MG tablet Commonly known as: COZAAR  Take 1 tablet (100 mg total) by mouth daily.   methocarbamol 500 MG tablet Commonly known as: ROBAXIN Take 500 mg by mouth 3 (three) times daily as needed for muscle spasms.   montelukast 10 MG tablet Commonly known as: SINGULAIR Take 10 mg by mouth at bedtime as needed (allergies).   nitroGLYCERIN  0.4 MG SL tablet Commonly known as: NITROSTAT  Place 1 tablet (0.4 mg total) under the tongue every 5 (five) minutes as needed for chest pain.   nystatin  cream Commonly known as: MYCOSTATIN  Apply 1 Application topically daily as needed (rash).   nystatin  powder Generic drug: nystatin  Apply 1 Application topically daily as needed (rash).   oxyCODONE  15 MG immediate release tablet Commonly known as: ROXICODONE  Take 15 mg by mouth every 6 (six) hours as needed for pain.   pantoprazole  40 MG tablet Commonly known as: PROTONIX  Take 1 tablet (40 mg total) by mouth daily.   ranolazine  500 MG 12 hr tablet Commonly known as: RANEXA  Take 1 tablet (500 mg total) by mouth 2 (two) times daily.   rosuvastatin  40 MG tablet Commonly known as: CRESTOR  Take 40 mg by mouth at bedtime.        Discharge Exam: Filed Weights   03/28/24 1911  Weight: 96.2 kg  BP (!) 123/93 (BP Location: Right Arm)   Pulse 62   Temp 98 F (36.7 C) (Oral)   Resp 18   Ht 5' 7 (1.702 m)   Wt 96.2 kg   SpO2 97%   BMI 33.20 kg/m   Well-appearing older male in no distress Full AROM, strength intact throughout, speech normal.  RRR, no MRG Nonlabored, clear.   Condition at discharge: stable  The results of significant diagnostics from this hospitalization (including imaging, microbiology, ancillary and laboratory) are listed below for reference.    Imaging Studies: MR ANGIO NECK WO CONTRAST Result Date: 03/29/2024 CLINICAL DATA:  TIA EXAM: MRA NECK WITHOUT CONTRAST TECHNIQUE: Angiographic images of the neck were acquired using MRA technique without intravenous contrast. Carotid stenosis measurements (when applicable) are obtained utilizing NASCET criteria, using the distal internal carotid diameter as the denominator. COMPARISON:  None Available. FINDINGS: Aortic arch: Not well visualized Right carotid system: There is no carotid stenosis Left carotid system: There is significant artifact around the carotid bifurcation. It is not well evaluated. Vertebral arteries: Both vertebral arteries are patent. The right is dominant Other: None. IMPRESSION: 1. No right carotid stenosis 2. Significant artifact around the left carotid bifurcation which is difficult to evaluate. Correlate with carotid ultrasound 3. Both vertebral arteries are patent Electronically Signed   By: Nancyann Burns M.D.   On: 03/29/2024 16:14   MR BRAIN WO CONTRAST Result Date: 03/29/2024 CLINICAL DATA:  Transient ischemic attack EXAM: MRI HEAD WITHOUT CONTRAST TECHNIQUE: Multiplanar, multiecho  pulse sequences of the brain and surrounding structures were obtained without intravenous contrast. COMPARISON:  None Available. FINDINGS: MRI brain: There is an old infarct in the right posterior temporal lobe and occipital lobe with encephalomalacia. No acute infarct. The ventricles are normal. No mass lesion. There are normal flow signals in the carotid arteries and basilar artery. No significant bone marrow signal abnormality. No significant abnormality in the paranasal sinuses or soft tissues. IMPRESSION: Old infarct in the right posterior temporal lobe and right occipital lobe with encephalomalacia No acute infarct Electronically Signed   By: Nancyann Burns M.D.   On: 03/29/2024 16:08   ECHOCARDIOGRAM COMPLETE Result Date: 03/29/2024    ECHOCARDIOGRAM REPORT   Patient Name:   LADARIEN BEEKS  Date of Exam: 03/29/2024 Medical Rec #:  [id number]      Height:       67.0 in Accession #:    [id number]     Weight:       212.0 lb Date of Birth:  1946-04-25      BSA:          2.073 m Patient Age:    78 years       BP:           123/93 mmHg Patient Gender: M              HR:           62 bpm. Exam Location:  [facility] Procedure: 2D Echo, Cardiac Doppler and Color Doppler (Both Spectral and Color            Flow Doppler were utilized during procedure). Indications:    TIA G45.9  History:        Patient has prior history of Echocardiogram examinations, most                 recent 11/14/2021. CAD and Previous Myocardial Infarction, TIA,                 Arrythmias:LBBB; Risk Factors:Hypertension and Dyslipidemia. Hx                 of 1st degree AV block.  Sonographer:    Aida Pizza RCS Referring Phys: [id number] CAROLE N Altergott IMPRESSIONS  1. Left ventricular ejection fraction, by estimation, is 60 to 65%. The left ventricle has normal function. The left ventricle has no regional wall motion abnormalities. There is moderate left ventricular hypertrophy. Left ventricular diastolic parameters are consistent with Grade I diastolic dysfunction (impaired relaxation).  2. Right ventricular systolic function is normal. The right ventricular size is normal.  3. The mitral valve is normal in structure. Trivial mitral valve regurgitation. No evidence of mitral stenosis.  4. The aortic valve has an indeterminant number of cusps. Aortic valve regurgitation is not visualized. No aortic stenosis is present.  5. The inferior vena cava is normal in size with greater than 50% respiratory variability, suggesting right atrial pressure of 3 mmHg. FINDINGS  Left Ventricle: Left ventricular ejection fraction, by estimation, is 60 to 65%. The left ventricle has normal function. The left ventricle has no regional wall motion abnormalities. The left ventricular internal cavity size was normal in size. There is  moderate left ventricular  hypertrophy. Left ventricular diastolic parameters are consistent with Grade I diastolic dysfunction (impaired relaxation). Normal left ventricular filling pressure. Right Ventricle: The right ventricular size is normal. Right vetricular wall thickness was not well visualized. Right ventricular systolic function is normal. Left Atrium: Left atrial  size was normal in size. Right Atrium: Right atrial size was normal in size. Pericardium: There is no evidence of pericardial effusion. Mitral Valve: The mitral valve is normal in structure. Trivial mitral valve regurgitation. No evidence of mitral valve stenosis. Tricuspid Valve: The tricuspid valve is normal in structure. Tricuspid valve regurgitation is not demonstrated. No evidence of tricuspid stenosis. Aortic Valve: The aortic valve has an indeterminant number of cusps. Aortic valve regurgitation is not visualized. No aortic stenosis is present. Aortic valve mean gradient measures 7.0 mmHg. Aortic valve peak gradient measures 12.1 mmHg. Aortic valve area, by VTI measures 2.26 cm. Pulmonic Valve: The pulmonic valve was not well visualized. Pulmonic valve regurgitation is not visualized. No evidence of pulmonic stenosis. Aorta: The aortic root is normal in size and structure. Venous: The inferior vena cava is normal in size with greater than 50% respiratory variability, suggesting right atrial pressure of 3 mmHg. IAS/Shunts: No atrial level shunt detected by color flow Doppler.  LEFT VENTRICLE PLAX 2D LVIDd:         4.40 cm   Diastology LVIDs:         2.90 cm   LV e' medial:    8.21 cm/s LV PW:         1.20 cm   LV E/e' medial:  11.1 LV IVS:        1.40 cm   LV e' lateral:   8.08 cm/s LVOT diam:     2.00 cm   LV E/e' lateral: 11.3 LV SV:         79 LV SV Index:   38 LVOT Area:     3.14 cm  RIGHT VENTRICLE RV S prime:     13.80 cm/s TAPSE (M-mode): 2.5 cm LEFT ATRIUM             Index        RIGHT ATRIUM           Index LA diam:        4.00 cm 1.93 cm/m   RA Area:      22.80 cm LA Vol (A2C):   54.7 ml 26.39 ml/m  RA Volume:   70.50 ml  34.01 ml/m LA Vol (A4C):   77.1 ml 37.19 ml/m LA Biplane Vol: 66.1 ml 31.89 ml/m  AORTIC VALVE AV Area (Vmax):    2.31 cm AV Area (Vmean):   2.28 cm AV Area (VTI):     2.26 cm AV Vmax:           174.00 cm/s AV Vmean:          120.000 cm/s AV VTI:            0.347 m AV Peak Grad:      12.1 mmHg AV Mean Grad:      7.0 mmHg LVOT Vmax:         128.00 cm/s LVOT Vmean:        87.000 cm/s LVOT VTI:          0.250 m LVOT/AV VTI ratio: 0.72  AORTA Ao Root diam: 3.70 cm MITRAL VALVE MV Area (PHT): 3.21 cm     SHUNTS MV Decel Time: 236 msec     Systemic VTI:  0.25 m MV E velocity: 91.10 cm/s   Systemic Diam: 2.00 cm MV A velocity: 114.00 cm/s MV E/A ratio:  0.80 Dorn Ross MD Electronically signed by Dorn Ross MD Signature Date/Time: 03/29/2024/3:00:29 PM    Final    CT Head  Wo Contrast Result Date: 03/28/2024 CLINICAL DATA:  Altered mental status. EXAM: CT HEAD WITHOUT CONTRAST TECHNIQUE: Contiguous axial images were obtained from the base of the skull through the vertex without intravenous contrast. RADIATION DOSE REDUCTION: This exam was performed according to the departmental dose-optimization program which includes automated exposure control, adjustment of the mA and/or kV according to patient size and/or use of iterative reconstruction technique. COMPARISON:  June 01, 2023 FINDINGS: Brain: There is generalized cerebral atrophy with widening of the extra-axial spaces and ventricular dilatation. There are areas of decreased attenuation within the white matter tracts of the supratentorial brain, consistent with microvascular disease changes. A chronic right occipital lobe infarct is seen. Vascular: Marked severity bilateral cavernous carotid artery calcification is noted. Skull: Normal. Negative for fracture or focal lesion. Sinuses/Orbits: No acute finding. Other: None. IMPRESSION: 1. Generalized cerebral atrophy with chronic white  matter small vessel ischemic changes. 2. Chronic right occipital lobe infarct. 3. No acute intracranial abnormality. Electronically Signed   By: Suzen Dials M.D.   On: 03/28/2024 20:30   DG Chest Port 1 View Result Date: 03/28/2024 CLINICAL DATA:  Weakness. EXAM: PORTABLE CHEST 1 VIEW COMPARISON:  Chest radiograph dated 11/25/2023. FINDINGS: Shallow inspiration. Left lung base atelectasis. Pneumonia is not excluded the right lung base. No pleural effusion pneumothorax. The cardiac silhouette is within normal limits. Atherosclerotic calcification of the aorta. No acute osseous pathology. IMPRESSION: Left lung base atelectasis. Pneumonia is not excluded. Electronically Signed   By: Vanetta Chou M.D.   On: 03/28/2024 20:06    Microbiology: Results for orders placed or performed during the hospital encounter of 11/13/21  MRSA Next Gen by PCR, Nasal     Status: None   Collection Time: 11/15/21 11:44 AM   Specimen: Nasal Mucosa; Nasal Swab  Result Value Ref Range Status   MRSA by PCR Next Gen NOT DETECTED NOT DETECTED Final    Comment: (NOTE) The GeneXpert MRSA Assay (FDA approved for NASAL specimens only), is one component of a comprehensive MRSA colonization surveillance program. It is not intended to diagnose MRSA infection nor to guide or monitor treatment for MRSA infections. Test performance is not FDA approved in patients less than 49 years old. Performed at [facility], [address], [place], [place] [address]     Labs: CBC: Recent Labs  Lab 03/28/24 1913 03/29/24 0408  WBC 7.0 5.9  NEUTROABS 4.6  --   HGB 13.2 11.4*  HCT 40.8 35.1*  MCV 89.5 88.6  PLT 217 192   Basic Metabolic Panel: Recent Labs  Lab 03/28/24 2016 03/29/24 0408  NA 140 140  K 4.2 3.7  CL 103 105  CO2 26 26  GLUCOSE 86 92  BUN 22 23  CREATININE 1.79* 1.76*  CALCIUM  9.2 8.5*  MG  --  2.2  PHOS  --  3.4   Liver Function Tests: Recent Labs  Lab 03/28/24 2016  AST 20  ALT 12   ALKPHOS 60  BILITOT 0.5  PROT 6.9  ALBUMIN 4.3   CBG: No results for input(s): GLUCAP in the last 168 hours.  Discharge time spent: greater than 30 minutes.  Signed: Bernardino KATHEE Come, MD [facility] 03/29/2024

## 2024-03-29 NOTE — Plan of Care (Signed)

## 2024-03-29 NOTE — Progress Notes (Signed)
 SLP Cancellation Note  Patient Details Name: Jeremy [name] MRN: [id number] DOB: May 18, 1946   Cancelled treatment:       Reason Eval/Treat Not Completed: SLP screened, no needs identified, will sign off. Pt's speech, language and cognition have returned to baseline. Thank you for this referral,   Lashunta Frieden H. Clois KILLIAN, CCC-SLP Speech Language Pathologist    Raguel VEAR Clois 03/29/2024, 9:25 AM

## 2024-03-29 NOTE — Plan of Care (Signed)
  Problem: Education: Goal: Knowledge of General Education information will improve Description: Including pain rating scale, medication(s)/side effects and non-pharmacologic comfort measures Outcome: Progressing   Problem: Health Behavior/Discharge Planning: Goal: Ability to manage health-related needs will improve Outcome: Progressing   Problem: Activity: Goal: Risk for activity intolerance will decrease Outcome: Progressing   Problem: Elimination: Goal: Will not experience complications related to bowel motility Outcome: Progressing   Problem: Pain Managment: Goal: General experience of comfort will improve and/or be controlled Outcome: Progressing   Problem: Education: Goal: Knowledge of disease or condition will improve Outcome: Progressing Goal: Knowledge of secondary prevention will improve (MUST DOCUMENT ALL) Outcome: Progressing Goal: Knowledge of patient specific risk factors will improve (DELETE if not current risk factor) Outcome: Progressing   Problem: Ischemic Stroke/TIA Tissue Perfusion: Goal: Complications of ischemic stroke/TIA will be minimized Outcome: Progressing   Problem: Health Behavior/Discharge Planning: Goal: Ability to manage health-related needs will improve Outcome: Progressing Goal: Goals will be collaboratively established with patient/family Outcome: Progressing   Problem: Self-Care: Goal: Ability to participate in self-care as condition permits will improve Outcome: Progressing Goal: Ability to communicate needs accurately will improve Outcome: Progressing   Problem: Nutrition: Goal: Risk of aspiration will decrease Outcome: Progressing Goal: Dietary intake will improve Outcome: Progressing

## 2024-03-29 NOTE — Evaluation (Signed)
 Physical Therapy Evaluation Patient Details Name: Jeremy [name] MRN: [id number] DOB: [birth year] Today's Date: 03/29/2024  History of Present Illness  Jeremy [name] is a 78 y.o. male with medical history significant for coronary artery disease status post stenting of LAD and RCA, right carotid stenosis status post CEA, prior CVA with no focal deficits, hypertension, hyperlipidemia, BPH, CKD 3B, who presents to the ER due to sudden onset altered mental status.  The patient was sitting at the table eating dinner around 1730 when suddenly he began to have a blank stare and started to play with his food.  He had difficulty with coordination and was not able to stand or follow commands.     In the ER, the patient was back to his baseline.  He is alert and oriented x 4, with an NIH of 0.  A noncontrast head CT was non-acute.  EDP requesting admission for TIA workup.   Clinical Impression  Patient functioning at baseline for functional mobility and gait demonstrating good return for ambulating in room, hallways without loss of balance. Plan:  Patient discharged from physical therapy to care of nursing for ambulation daily as tolerated for length of stay.          If plan is discharge home, recommend the following: Help with stairs or ramp for entrance   Can travel by private vehicle        Equipment Recommendations None recommended by PT  Recommendations for Other Services       Functional Status Assessment Patient has not had a recent decline in their functional status     Precautions / Restrictions Precautions Precautions: None Recall of Precautions/Restrictions: Intact Restrictions Weight Bearing Restrictions Per Provider Order: No      Mobility  Bed Mobility Overal bed mobility: Independent                  Transfers Overall transfer level: Independent                      Ambulation/Gait Ambulation/Gait assistance: Modified independent (Device/Increase  time), Independent Gait Distance (Feet): 150 Feet Assistive device: None Gait Pattern/deviations: WFL(Within Functional Limits) Gait velocity: near normal     General Gait Details: grossly WFL with good return for ambulating in room, hallways without loss of balance  Stairs            Wheelchair Mobility     Tilt Bed    Modified Rankin (Stroke Patients Only)       Balance Overall balance assessment: Independent                                           Pertinent Vitals/Pain Pain Assessment Pain Assessment: No/denies pain    Home Living Family/patient expects to be discharged to:: Private residence Living Arrangements: Spouse/significant other Available Help at Discharge: Family Type of Home: House Home Access: Stairs to enter Entrance Stairs-Rails: Right Entrance Stairs-Number of Steps: 1-2 Alternate Level Stairs-Number of Steps: 12 Home Layout: Two level Home Equipment: BSC/3in1;Shower seat;Rolling Walker (2 wheels);Cane - single point      Prior Function Prior Level of Function : Independent/Modified Independent;Driving             Mobility Comments: Community ambulation without AD, drives ADLs Comments: Independent     Extremity/Trunk Assessment   Upper Extremity Assessment Upper Extremity Assessment: Defer to OT  evaluation    Lower Extremity Assessment Lower Extremity Assessment: Overall WFL for tasks assessed    Cervical / Trunk Assessment Cervical / Trunk Assessment: Normal  Communication   Communication Communication: No apparent difficulties    Cognition Arousal: Alert Behavior During Therapy: WFL for tasks assessed/performed   PT - Cognitive impairments: No apparent impairments                         Following commands: Intact       Cueing Cueing Techniques: Verbal cues     General Comments      Exercises     Assessment/Plan    PT Assessment Patient does not need any further PT  services  PT Problem List         PT Treatment Interventions      PT Goals (Current goals can be found in the Care Plan section)  Acute Rehab PT Goals Patient Stated Goal: return home with family to assist PT Goal Formulation: With patient Time For Goal Achievement: 03/29/24 Potential to Achieve Goals: Good    Frequency       Co-evaluation               AM-PAC PT 6 Clicks Mobility  Outcome Measure Help needed turning from your back to your side while in a flat bed without using bedrails?: None Help needed moving from lying on your back to sitting on the side of a flat bed without using bedrails?: None Help needed moving to and from a bed to a chair (including a wheelchair)?: None Help needed standing up from a chair using your arms (e.g., wheelchair or bedside chair)?: None Help needed to walk in hospital room?: None Help needed climbing 3-5 steps with a railing? : A Little 6 Click Score: 23    End of Session   Activity Tolerance: Patient tolerated treatment well Patient left: in chair Nurse Communication: Mobility status      Time: 9151-9141 PT Time Calculation (min) (ACUTE ONLY): 10 min   Charges:   PT Evaluation $PT Eval Low Complexity: 1 Low PT Treatments $Therapeutic Activity: 8-22 mins PT General Charges $$ ACUTE PT VISIT: 1 Visit         12:31 PM, 03/29/24 Lynwood Music, MPT Physical Therapist with [facility] [phone] office [phone] mobile phone

## 2024-03-29 NOTE — Care Management Obs Status (Signed)
 MEDICARE OBSERVATION STATUS NOTIFICATION   Patient Details  Name: Jeremy [name] MRN: [id number] Date of Birth: May 11, 1946   Medicare Observation Status Notification Given:  Yes    Duwaine LITTIE Ada 03/29/2024, 12:48 PM

## 2024-03-29 NOTE — TOC CM/SW Note (Signed)
 Transition of Care [facility]) - Inpatient Brief Assessment   Patient Details  Name: Jeremy [name] MRN: [id number] Date of Birth: Sep 18, 1945  Transition of Care [facility]) CM/SW Contact:    Noreen KATHEE Pinal, LCSWA Phone Number: 03/29/2024, 11:24 AM   Clinical Narrative:  Inpatient Care Management (ICM) has reviewed patient and no other ICM needs have been identified at this time. We will continue to monitor patient advancement through interdisciplinary progression rounds. If new patient transition needs arise, please place a ICM consult.  Transition of Care Asessment: Insurance and Status: Insurance coverage has been reviewed Patient has primary care physician: Yes Home environment has been reviewed: Single Family Home Prior level of function:: Independent Prior/Current Home Services: No current home services Social Drivers of Health Review: SDOH reviewed no interventions necessary Readmission risk has been reviewed: Yes Transition of care needs: no transition of care needs at this time

## 2024-04-01 ENCOUNTER — Ambulatory Visit: Admitting: Orthopedic Surgery

## 2024-04-01 ENCOUNTER — Encounter: Payer: Self-pay | Admitting: Orthopedic Surgery

## 2024-04-01 VITALS — BP 123/93 | Ht 67.0 in | Wt 212.0 lb

## 2024-04-01 DIAGNOSIS — M65321 Trigger finger, right index finger: Secondary | ICD-10-CM

## 2024-04-01 DIAGNOSIS — M65322 Trigger finger, left index finger: Secondary | ICD-10-CM

## 2024-04-01 DIAGNOSIS — M19011 Primary osteoarthritis, right shoulder: Secondary | ICD-10-CM | POA: Insufficient documentation

## 2024-04-01 DIAGNOSIS — N400 Enlarged prostate without lower urinary tract symptoms: Secondary | ICD-10-CM | POA: Insufficient documentation

## 2024-04-01 MED ORDER — METHYLPREDNISOLONE ACETATE 40 MG/ML IJ SUSP
40.0000 mg | Freq: Once | INTRAMUSCULAR | Status: AC
  Administered 2024-04-01: 40 mg via INTRA_ARTICULAR

## 2024-04-01 MED ORDER — METHYLPREDNISOLONE ACETATE 40 MG/ML IJ SUSP
40.0000 mg | Freq: Once | INTRAMUSCULAR | Status: AC
Start: 2024-04-01 — End: 2024-04-01
  Administered 2024-04-01: 40 mg via INTRA_ARTICULAR

## 2024-04-01 NOTE — Progress Notes (Signed)
 Office Visit Note   Patient: Jeremy [name]           Date of Birth: [birth year]           MRN: [id number] Visit Date: 04/01/2024 Requested by: Jeremy Norleen PEDLAR, MD [address] Jeremy [name],  [name] PCP: Jeremy Norleen PEDLAR, MD   Assessment & Plan:   Encounter Diagnoses  Name Primary?   Trigger index finger of left hand Yes   Trigger index finger of right hand     Meds ordered this encounter  Medications   methylPREDNISolone  acetate (DEPO-MEDROL ) injection 40 mg   methylPREDNISolone  acetate (DEPO-MEDROL ) injection 40 mg    Inject both index fingers    Procedure note for injection   Chief Complaint  Patient presents with   Hand Problem    Trigger finger      Encounter Diagnoses  Name Primary?   Trigger index finger of left hand Yes   Trigger index finger of right hand           Chief Complaint  Patient presents with   Hand Problem    Trigger finger      Encounter Diagnoses  Name Primary?   Trigger index finger of left hand Yes   Trigger index finger of right hand         The patient has consented for injection of the A1 pulley left index finger  Medication: Depo-Medrol  40 mg and lidocaine  1%  Time out completed: Yes  The site of injection was cleaned with alcohol  and ethyl chloride.  The injection was given without any complications appropriate precautions were given.    Procedure note for injection   Chief Complaint  Patient presents with   Hand Problem    Trigger finger      Encounter Diagnoses  Name Primary?   Trigger index finger of left hand Yes   Trigger index finger of right hand         The patient has consented for injection of the A1 pulley right index finger  Medication: Depo-Medrol  40 mg and lidocaine  1%  Time out completed: Yes  The site of injection was cleaned with alcohol  and ethyl chloride.  The injection was given without any complications appropriate precautions were given.   Subjective: Chief  Complaint  Patient presents with   Hand Problem    Trigger finger     HPI: 78 year old male in 2007 and 2011 he had trigger fingers of various digits had injections successfully presents now with locking of the left index finger and pain over the right index finger A1 pulley.  Symptoms just started.              ROS: He denies numbness or tingling no trauma to the hand   Images personally read and my interpretation : No imaging necessary  Visit Diagnoses:  1. Trigger index finger of left hand   2. Trigger index finger of right hand      Follow-Up Instructions: Return if symptoms worsen or fail to improve.    Objective: Vital Signs: BP (!) 123/93 Comment: 03/29/24  Ht 5' 7 (1.702 m)   Wt 212 lb (96.2 kg)   BMI 33.20 kg/m   Physical Exam The patient is awake alert and oriented x 3  Pleasant mood and affect  Normal appearance normal habitus without deformities  Ortho Exam  Right and left index finger exhibit tenderness over the A1 pulley  The left actually locks  The  right clicks  Tendon function is normal Neurovascular exam is normal    Specialty Comments:  No specialty comments available.  Imaging: No results found.   PMFS History: Patient Active Problem List   Diagnosis Date Noted   Benign prostatic hyperplasia 04/01/2024   Osteoarthritis of right shoulder 04/01/2024   TIA (transient ischemic attack) 03/28/2024   Anemia 12/04/2023   Acute low back pain 03/29/2023   Neuropathy 07/04/2022   Chronic pain 01/02/2022   Stroke (HCC) 11/18/2021   LBBB (left bundle branch block) 11/18/2021   First degree AV block 11/18/2021   Gastroesophageal reflux disease without esophagitis 07/08/2021   Hypercholesterolemia 07/08/2021   Hypertensive renal disease 07/07/2021   Cough 12/03/2020   Fatigue 11/04/2020   Lower urinary tract symptoms due to benign prostatic hyperplasia 11/04/2020   Acute kidney failure, unspecified 09/30/2020   Chest pain 09/18/2020    Acute otitis externa of left ear 11/11/2019   ACS (acute coronary syndrome) (HCC) 03/25/2019   Unstable angina (HCC) 03/25/2019   Non-ST elevation (NSTEMI) myocardial infarction - periprocedural 03/25/2019   Glenohumeral arthritis, right 03/12/2019   Cerebral thrombosis with cerebral infarction 06/28/2018   Hypernatremia 06/27/2018   CKD (chronic kidney disease), stage III 06/27/2018   Acute respiratory failure with hypoxia (HCC)    Acute encephalopathy 06/20/2018   Asymptomatic carotid artery stenosis without infarction, right 12/25/2017   Pre-operative cardiovascular examination 11/03/2017   Carotid artery disease 11/03/2017   Shoulder joint pain 09/17/2013   Numbness around mouth 12/01/2011   Erectile dysfunction 09/05/2010   TRIGGER FINGER, LEFT MIDDLE 04/20/2010   ANAL FISSURE 04/03/2009   Mixed dyslipidemia 04/01/2009   RECTAL BLEEDING 04/01/2009   ABDOMINAL PAIN-LLQ 04/01/2009   ABDOMINAL PAIN, GENERALIZED 03/30/2009   OTITIS EXTERNA, LEFT 05/07/2008   BRONCHITIS, ACUTE 04/21/2008   CARPAL TUNNEL SYNDROME, BILATERAL 12/25/2007   Carpal tunnel syndrome, bilateral 12/25/2007   COLD SORE 03/12/2007   CAD in native artery 01/01/2007   OBESITY NOS 06/19/2006   Essential hypertension 06/19/2006   MYOCARDIAL INFARCTION, HX OF 06/19/2006   History of colonic polyps 06/19/2006   Past Medical History:  Diagnosis Date   Anxiety    Arthritis    Basal cell carcinoma 02/11/2021   nod- left dorsal hand (EXC)   CAD (coronary artery disease)    a. s/p prior stenting of LAD and RCA b. 03/2019: NSTEMI and required PTCA/DESx1 to the LAD and successful scoring balloon angioplasty in the previously stented segment of LCx   Carotid stenosis, asymptomatic, right    Carpal tunnel syndrome    bilateral   Coronary artery disease    GERD (gastroesophageal reflux disease)    History of kidney stones    Hx of colonic polyp    Hypercholesterolemia    Hypertension    Myocardial infarction  (HCC)    hx of   Obesity    SCC (squamous cell carcinoma) 02/11/2021   in situ- right temple (CX35FU)   Squamous cell carcinoma of skin 08/09/2016   well differentiated on right outer eye - tx p bx   Stroke (HCC)     Family History  Problem Relation Age of Onset   Coronary artery disease Mother 60       deceased   Hypertension Mother    Other Father 65       deceased old age   Other Sister 72       living and healthy   Coronary artery disease Brother    Skin cancer Brother  Colon cancer Neg Hx    Pancreatic cancer Neg Hx    Stomach cancer Neg Hx    Esophageal cancer Neg Hx    Liver disease Neg Hx     Past Surgical History:  Procedure Laterality Date   CARDIAC CATHETERIZATION     CATARACT EXTRACTION W/PHACO Left 03/12/2021   Procedure: CATARACT EXTRACTION PHACO AND INTRAOCULAR LENS PLACEMENT (IOC);  Surgeon: Harrie Agent, MD;  Location: [facility];  Service: Ophthalmology;  Laterality: Left;   CDE: 14.58   CATARACT EXTRACTION W/PHACO Right 04/08/2021   Procedure: CATARACT EXTRACTION PHACO AND INTRAOCULAR LENS PLACEMENT RIGHT EYE;  Surgeon: Harrie Agent, MD;  Location: [facility];  Service: Ophthalmology;  Laterality: Right;  CDE 10.94   CORONARY BALLOON ANGIOPLASTY N/A 03/26/2019   Procedure: CORONARY BALLOON ANGIOPLASTY;  Surgeon: Verlin Lonni BIRCH, MD;  Location: [facility];  Service: Cardiovascular;  Laterality: N/A;   CORONARY STENT INTERVENTION N/A 03/26/2019   Procedure: CORONARY STENT INTERVENTION;  Surgeon: Verlin Lonni BIRCH, MD;  Location: [facility];  Service: Cardiovascular;  Laterality: N/A;   CORONARY STENT INTERVENTION N/A 11/15/2021   Procedure: CORONARY STENT INTERVENTION;  Surgeon: Court Dorn PARAS, MD;  Location: [facility];  Service: Cardiovascular;  Laterality: N/A;   CORONARY STENT PLACEMENT  2000   x3    CYST EXCISION     x 2   ENDARTERECTOMY Right 12/25/2017   Procedure: ENDARTERECTOMY CAROTID RIGHT;  Surgeon: Oris Krystal FALCON, MD;  Location: [facility];  Service: Vascular;  Laterality: Right;   LEFT HEART CATH AND CORONARY ANGIOGRAPHY N/A 03/26/2019   Procedure: LEFT HEART CATH AND CORONARY ANGIOGRAPHY;  Surgeon: Verlin Lonni BIRCH, MD;  Location: [facility];  Service: Cardiovascular;  Laterality: N/A;   LEFT HEART CATH AND CORONARY ANGIOGRAPHY N/A 11/15/2021   Procedure: LEFT HEART CATH AND CORONARY ANGIOGRAPHY;  Surgeon: Court Dorn PARAS, MD;  Location: [facility];  Service: Cardiovascular;  Laterality: N/A;   PATCH ANGIOPLASTY Right 12/25/2017   Procedure: PATCH ANGIOPLASTY RIGHT CAROTID ARTERY;  Surgeon: Oris Krystal FALCON, MD;  Location: [facility];  Service: Vascular;  Laterality: Right;   TEAR DUCT PROBING Bilateral 07/2020   done at [facility]. Dr. Greig Gay   TEE WITHOUT CARDIOVERSION N/A 06/29/2018   Procedure: TRANSESOPHAGEAL ECHOCARDIOGRAM (TEE);  Surgeon: Okey Vina GAILS, MD;  Location: [facility];  Service: Cardiovascular;  Laterality: N/A;   Social History   Occupational History   Occupation: retired    Comment: welder  Tobacco Use   Smoking status: Former    Current packs/day: 0.00    Average packs/day: 2.0 packs/day for 20.0 years (40.0 ttl pk-yrs)    Types: Cigarettes    Start date: 05/23/1968    Quit date: 05/23/1988    Years since quitting: 35.8   Smokeless tobacco: Never   Tobacco comments:    smoked 2 packs per day for 20 years  Vaping Use   Vaping status: Never Used  Substance and Sexual Activity   Alcohol  use: No   Drug use: No   Sexual activity: Yes

## 2024-04-01 NOTE — Progress Notes (Signed)
  Intake history:  Chief Complaint  Patient presents with   Hand Problem    Trigger finger      BP (!) 123/93 Comment: 03/29/24  Ht 5' 7 (1.702 m)   Wt 212 lb (96.2 kg)   BMI 33.20 kg/m  Body mass index is 33.2 kg/m.  Pharmacy? [facility] ____________________________________  WHAT ARE WE SEEING YOU FOR TODAY?   Trigger left index  How long has this bothered you? (DOI?DOS?WS?)  52m  Was there an injury? No  Anticoag.  Yes   Any ALLERGIES ________ Allergies  Allergen Reactions   Codeine Anaphylaxis   Oxycontin  [Oxycodone  Hcl] Itching    Through IV- has since had oral oxycodone  without issue   Contrast Media [Iodinated Contrast Media] Nausea Only    Patient got very nauseated. Gave 4mg  Zofran . Possibly pre medicate with Zofran     Morphine  And Codeine Itching   ______________________________________   Treatment:  Have you taken:  Tylenol  Yes  Advil  No  Had PT No  Had injection No  Other  _________________________

## 2024-04-03 DIAGNOSIS — E782 Mixed hyperlipidemia: Secondary | ICD-10-CM | POA: Diagnosis not present

## 2024-04-03 DIAGNOSIS — G629 Polyneuropathy, unspecified: Secondary | ICD-10-CM | POA: Diagnosis not present

## 2024-04-03 DIAGNOSIS — N1831 Chronic kidney disease, stage 3a: Secondary | ICD-10-CM | POA: Diagnosis not present

## 2024-04-03 DIAGNOSIS — I639 Cerebral infarction, unspecified: Secondary | ICD-10-CM | POA: Diagnosis not present

## 2024-04-03 DIAGNOSIS — J302 Other seasonal allergic rhinitis: Secondary | ICD-10-CM | POA: Diagnosis not present

## 2024-04-03 DIAGNOSIS — K219 Gastro-esophageal reflux disease without esophagitis: Secondary | ICD-10-CM | POA: Diagnosis not present

## 2024-04-03 DIAGNOSIS — G40909 Epilepsy, unspecified, not intractable, without status epilepticus: Secondary | ICD-10-CM | POA: Diagnosis not present

## 2024-04-03 DIAGNOSIS — Z09 Encounter for follow-up examination after completed treatment for conditions other than malignant neoplasm: Secondary | ICD-10-CM | POA: Diagnosis not present

## 2024-04-03 DIAGNOSIS — G8929 Other chronic pain: Secondary | ICD-10-CM | POA: Diagnosis not present

## 2024-04-03 DIAGNOSIS — I10 Essential (primary) hypertension: Secondary | ICD-10-CM | POA: Diagnosis not present

## 2024-04-03 DIAGNOSIS — I2510 Atherosclerotic heart disease of native coronary artery without angina pectoris: Secondary | ICD-10-CM | POA: Diagnosis not present

## 2024-04-03 DIAGNOSIS — N401 Enlarged prostate with lower urinary tract symptoms: Secondary | ICD-10-CM | POA: Diagnosis not present

## 2024-04-09 DIAGNOSIS — I2510 Atherosclerotic heart disease of native coronary artery without angina pectoris: Secondary | ICD-10-CM | POA: Diagnosis not present

## 2024-04-09 DIAGNOSIS — R7301 Impaired fasting glucose: Secondary | ICD-10-CM | POA: Diagnosis not present

## 2024-04-09 DIAGNOSIS — I129 Hypertensive chronic kidney disease with stage 1 through stage 4 chronic kidney disease, or unspecified chronic kidney disease: Secondary | ICD-10-CM | POA: Diagnosis not present

## 2024-04-09 DIAGNOSIS — G459 Transient cerebral ischemic attack, unspecified: Secondary | ICD-10-CM | POA: Diagnosis not present

## 2024-04-09 DIAGNOSIS — M545 Low back pain, unspecified: Secondary | ICD-10-CM | POA: Diagnosis not present

## 2024-04-09 DIAGNOSIS — N1831 Chronic kidney disease, stage 3a: Secondary | ICD-10-CM | POA: Diagnosis not present

## 2024-04-09 DIAGNOSIS — G8929 Other chronic pain: Secondary | ICD-10-CM | POA: Diagnosis not present

## 2024-04-09 DIAGNOSIS — K649 Unspecified hemorrhoids: Secondary | ICD-10-CM | POA: Diagnosis not present

## 2024-04-09 DIAGNOSIS — E782 Mixed hyperlipidemia: Secondary | ICD-10-CM | POA: Diagnosis not present

## 2024-04-09 DIAGNOSIS — D649 Anemia, unspecified: Secondary | ICD-10-CM | POA: Diagnosis not present

## 2024-04-09 DIAGNOSIS — G40909 Epilepsy, unspecified, not intractable, without status epilepticus: Secondary | ICD-10-CM | POA: Diagnosis not present

## 2024-04-09 DIAGNOSIS — I639 Cerebral infarction, unspecified: Secondary | ICD-10-CM | POA: Diagnosis not present

## 2024-04-16 NOTE — Telephone Encounter (Signed)
 Received fax from [facility] regarding monitor. Pt has requested a delay in hook up.

## 2024-04-21 DIAGNOSIS — N401 Enlarged prostate with lower urinary tract symptoms: Secondary | ICD-10-CM | POA: Diagnosis not present

## 2024-04-21 DIAGNOSIS — I10 Essential (primary) hypertension: Secondary | ICD-10-CM | POA: Diagnosis not present

## 2024-04-21 DIAGNOSIS — K219 Gastro-esophageal reflux disease without esophagitis: Secondary | ICD-10-CM | POA: Diagnosis not present

## 2024-04-21 DIAGNOSIS — E782 Mixed hyperlipidemia: Secondary | ICD-10-CM | POA: Diagnosis not present

## 2024-04-23 ENCOUNTER — Ambulatory Visit: Attending: Cardiovascular Disease

## 2024-04-25 DIAGNOSIS — E782 Mixed hyperlipidemia: Secondary | ICD-10-CM | POA: Diagnosis not present

## 2024-04-25 DIAGNOSIS — I2510 Atherosclerotic heart disease of native coronary artery without angina pectoris: Secondary | ICD-10-CM | POA: Diagnosis not present

## 2024-04-25 DIAGNOSIS — M5442 Lumbago with sciatica, left side: Secondary | ICD-10-CM | POA: Diagnosis not present

## 2024-04-25 DIAGNOSIS — G629 Polyneuropathy, unspecified: Secondary | ICD-10-CM | POA: Diagnosis not present

## 2024-04-25 DIAGNOSIS — I10 Essential (primary) hypertension: Secondary | ICD-10-CM | POA: Diagnosis not present

## 2024-04-25 DIAGNOSIS — G40909 Epilepsy, unspecified, not intractable, without status epilepticus: Secondary | ICD-10-CM | POA: Diagnosis not present

## 2024-04-25 DIAGNOSIS — K219 Gastro-esophageal reflux disease without esophagitis: Secondary | ICD-10-CM | POA: Diagnosis not present

## 2024-04-25 DIAGNOSIS — I639 Cerebral infarction, unspecified: Secondary | ICD-10-CM | POA: Diagnosis not present

## 2024-04-25 DIAGNOSIS — N1831 Chronic kidney disease, stage 3a: Secondary | ICD-10-CM | POA: Diagnosis not present

## 2024-04-25 DIAGNOSIS — J302 Other seasonal allergic rhinitis: Secondary | ICD-10-CM | POA: Diagnosis not present

## 2024-04-25 DIAGNOSIS — G8929 Other chronic pain: Secondary | ICD-10-CM | POA: Diagnosis not present

## 2024-04-25 DIAGNOSIS — N401 Enlarged prostate with lower urinary tract symptoms: Secondary | ICD-10-CM | POA: Diagnosis not present

## 2024-05-08 ENCOUNTER — Ambulatory Visit: Admitting: Adult Health

## 2024-05-08 ENCOUNTER — Encounter: Payer: Self-pay | Admitting: Adult Health

## 2024-05-08 VITALS — BP 152/75 | HR 76 | Ht 67.0 in | Wt 208.2 lb

## 2024-05-08 DIAGNOSIS — I639 Cerebral infarction, unspecified: Secondary | ICD-10-CM

## 2024-05-08 DIAGNOSIS — R569 Unspecified convulsions: Secondary | ICD-10-CM | POA: Diagnosis not present

## 2024-05-08 DIAGNOSIS — R404 Transient alteration of awareness: Secondary | ICD-10-CM

## 2024-05-08 NOTE — Progress Notes (Signed)
 [facility] [address] [place]. [place] [address] [phone]       STROKE FOLLOW UP NOTE  [name] Date of Birth:  04-14-1946 Medical Record Number:  [id number]   Reason for visit: Stroke follow-up    SUBJECTIVE:   CHIEF COMPLAINT:  Chief Complaint  Patient presents with   Follow-up    Patient is in room 3 with wife  Patient here for stroke/seizure follow up, no issues or concerns at the moment.     HPI:   Update 05/08/2024 JM: Patient returns for follow-up visit accompanied by his wife.  He was evaluated at [facility], ED last month for sudden onset AMS associated with blank stare and started playing with his food during dinner, difficulty with coordination and unable to stand or follow commands.  He apparently was able to recall events and was responsive but felt he was unable to move his extremities.  This lasted about 20 minutes prior to returning to baseline.  Return to baseline without postictal symptoms.  MRI and MRA no acute abnormalities. Unclear if event related to TIA vs seizure, did not make adjustments to Depakote  125 mg nightly due to prior difficulty tolerating higher dosages and diagnostic uncertainty and advised follow-up with neurology.  No recurrent events since that time. He reports he was out working in the yard all day prior to above event and occurred after he sat down to eat dinner.  Wife reports he was able to follow commands throughout the event.  She checked blood pressure which was at baseline. He reports remembering bits and pieces of event, no extremity jerking, tongue biting or incontinence. No fatigue or confusion after event.  He remains on Depakote  125 mg nightly without side effects.  He also remains on gabapentin  300 mg twice daily which he has been on chronically for seizure prevention per wife.  He is currently wearing 30 day cardiac monitor per cardiology and has follow-up visit next month.  He remains on DAPT as  well as Crestor .  Routinely monitors blood pressure at home and has been stable, can be elevated at appointments.  No further questions or concerns at this time.     History provided for reference purposes only Update 10/09/2023 JM: Patient returns for follow-up visit accompanied by his wife.  Reports overall doing well since prior visit.  Previously decreased Depakote  dose from 250 to 125 nightly, feels his fatigue improved and not sleeping as much, no significant change tremor, still not bothersome.  Also continues on gabapentin  300 mg twice daily.  Denies any seizure activity or any recurrent headaches.  No new stroke/TIA symptoms.  Continued left peripheral visual impairment and occasional word finding difficulty which has been stable.  Remains on DAPT and Crestor  without side effects.  Routinely follows with PCP and cardiology.  No questions or concerns at this time.  Update 04/03/2023 JM: Patient returns for follow-up visit accompanied by his wife.  Patient reports overall he has been doing well since prior visit.  He did have an episode yesterday during church where he started to feel lightheaded, this lasted for about 2 hours, went home after church and took a nap. This is not new, occurs intermittently.  Does admit to poor water  intake, did not eat that morning, ate pizza prior to leaving church with some improvement of symptoms.  No other associated symptoms.  Wife believes tremor slightly worse, not noticeable to the patient.  No recurrent headaches. MRI brain did not show  any new strokes, showed expected evolution of prior infarcts.  Denies any seizure activity.  Previously reduced Depakote  dosage to 250 mg nightly due to excessive daytime fatigue with noted improvement. Mood has been stable. He questions further reducing dosage.  Denies new stroke/TIA symptoms.  Compliant on aspirin , Plavix  and Crestor .  Routinely follows with PCP and cardiology.  No further questions or concerns at this  time.  Update 12/06/2022 JM: Patient returns for routine follow-up via his wife.patient mentions about 1 month ago, he had a severe occipital headache that was debilitating, denies any neck pain and radiating symptoms.  Per wife he just looked bad and had a distant look. He spent the day in bed. She tried to encourage him to go to the emergency room but he declined. Denies any new extremity or facial weakness, speech changes, cognitive changes or vision changes during that time. He does feel like his balance has been worse since that time feeling more unsteady especially on stairs and per wife he has been more moody. Ambulates without assistive device, denies any recent falls.  Continues to have memory difficulties but not necessarily worsened since that time.  He has had 1 or 2 mild headaches since that time but none over the past couple of weeks.  Continued poststroke peripheral visual loss and occasional word finding difficulties without change.  Routinely follows with ophthalmology with stable visual exam.  At prior visit, concern of excessive fatigue and LUE tremor, reduced Depakote  dosage down to 500mg  nightly which helped some where he isn't sleeping as much during the day and tremor only on occasion but still has daytime fatigue.  He also continues on gabapentin  300 mg twice daily which was started back in 2020 per wife for seizure prevention.  His daytime fatigue complaints has been more severe/significant after his stroke in 2023. Remains on aspirin , Plavix  and Crestor .  Routinely follows with PCP for stroke risk factor management and routinely follows with cardiology.  Update 07/05/2021 JM: Patient returns for 31-month follow-up accompanied by his wife.  Stable since prior visit, denies new stroke/TIA symptoms.  Peripheral visual loss unchanged since prior visit and occasional word mix up per wife, essentially unchanged from prior visit.  Compliant on aspirin , Plavix  and Crestor .  Blood pressure  well-controlled.  Routinely follows with PCP and cardiology.  Wife concerned that he is staying in bed more, was present prior to his stroke but has been gradually worsening. Will wake up not feeling well in the morning and will shortly go back to bed or will continue to become fatigued after little activity. Cardiology questioned ongoing need of Depakote  and to see if this was possibly contributing which he has remained on for mood stabilization since his stroke and history of seizures.  Prior Depakote  level 39.  Wife also mentions onset of LUE tremor, started shortly after prior visit, can interfere with activity/functioning and at times will drop items. Denies any pain in hand or sensation of weakness.   Initial visit 01/06/2022 JM: Patient is being seen for initial hospital follow-up accompanied by his wife.  Reports doing well since discharge. Reports he was seen by ophthalmologist since discharge, was told he has some peripheral visual loss but is not noticeable by patient.  He has since returned back to all prior activities as well as driving without difficulty.  Wife does mention that he will occasionally mix up words or say the wrong word which has been present since his recent stroke.  This is fairly unchanged  since discharge.  Denies any new stroke/TIA symptoms. He does note increased fatigue since discharge.  Does admit to significant snoring and per wife, ICU nurses reported witnessed apneas.  Reports she sleeps well throughout the night without any difficulty.  He has not previously underwent sleep study.  Remains on Depakote  DR 500 mg twice daily, denies side effects. He has not had any issues with outbursts or agitation.  No seizure activity.  Recent Depakote  level 39. Repeat ammonia level now WNL.   Remains on both aspirin  and Plavix  and Crestor , denies side effects Blood pressure today 159/66 - monitors at home and typically 120-130s  He has since had follow-up with cardiology and  PCP  No further concerns at this time   Stroke admission 11/13/2021 Mr. DYON ROTERT is a 78 y.o. male with history of right CEA 2019, CAD, hypertension, hyperlipidemia, history of seizure and stroke 05/2018 who was initially admitted on 11/13/2021 for intermittent chest pain and subsequently underwent cardiac cath and stenting.  Post cardiac cath on 6/26 patient was found to have confusion, difficulty speaking, somnolence and mild left arm drift.  Neurology was consulted with MRI showing right PCA, cerebellar and punctate frontal parietal infarcts embolic likely related to cardiac cath procedure below unable to completely rule out A-fib although not seen on telemetry, Dr. Wonda did not feel monitor warranted at this time.  Carotid Doppler unremarkable.  EF 60 to 65%.  EEG moderate diffuse encephalopathy.  LDL 59.  A1c 5.4.  On aspirin  and Plavix  per cardiology recommendations as well as Crestor  40 mg daily.  Prior history of seizure in 05/2018 but discontinued AEDs d/t cognitive side effects including Dilantin , Keppra  and Vimpat  at prior visit with Dr. Rosemarie in 07/2018.  Neurology recommended starting Depakote  for mood stabilization (combativeness and agitation during admission) and prior history of seizure.  Also noted low B12 levels at 192 and elevated ammonia at 41.  Recommended repeat labs at follow-up visits.  Residual deficits of left upper quadrantanopsia and advised no driving until neurology f/u. He was discharged home on 6/29.      PERTINENT IMAGING  Per hospitalization 11/15/2021 CT head no acute finding CT head and neck limited, nondiagnostic MRI right PCA, right cerebellum and punctate right frontal parietal infarcts Carotid Doppler unremarkable 2D Echo EF 60 to 65% EEG moderate diffuse encephalopathy LDL 59 HgbA1c 5.4    ROS:   14 system review of systems performed and negative with exception of those listed in HP  PMH:  Past Medical History:  Diagnosis Date   Anxiety     Arthritis    Basal cell carcinoma 02/11/2021   nod- left dorsal hand (EXC)   CAD (coronary artery disease)    a. s/p prior stenting of LAD and RCA b. 03/2019: NSTEMI and required PTCA/DESx1 to the LAD and successful scoring balloon angioplasty in the previously stented segment of LCx   Carotid stenosis, asymptomatic, right    Carpal tunnel syndrome    bilateral   Coronary artery disease    GERD (gastroesophageal reflux disease)    History of kidney stones    Hx of colonic polyp    Hypercholesterolemia    Hypertension    Myocardial infarction (HCC)    hx of   Obesity    SCC (squamous cell carcinoma) 02/11/2021   in situ- right temple (CX35FU)   Squamous cell carcinoma of skin 08/09/2016   well differentiated on right outer eye - tx p bx   Stroke (HCC)  PSH:  Past Surgical History:  Procedure Laterality Date   CARDIAC CATHETERIZATION     CATARACT EXTRACTION W/PHACO Left 03/12/2021   Procedure: CATARACT EXTRACTION PHACO AND INTRAOCULAR LENS PLACEMENT (IOC);  Surgeon: Harrie Agent, MD;  Location: [facility];  Service: Ophthalmology;  Laterality: Left;   CDE: 14.58   CATARACT EXTRACTION W/PHACO Right 04/08/2021   Procedure: CATARACT EXTRACTION PHACO AND INTRAOCULAR LENS PLACEMENT RIGHT EYE;  Surgeon: Harrie Agent, MD;  Location: [facility];  Service: Ophthalmology;  Laterality: Right;  CDE 10.94   CORONARY BALLOON ANGIOPLASTY N/A 03/26/2019   Procedure: CORONARY BALLOON ANGIOPLASTY;  Surgeon: Verlin Lonni BIRCH, MD;  Location: [facility];  Service: Cardiovascular;  Laterality: N/A;   CORONARY STENT INTERVENTION N/A 03/26/2019   Procedure: CORONARY STENT INTERVENTION;  Surgeon: Verlin Lonni BIRCH, MD;  Location: [facility];  Service: Cardiovascular;  Laterality: N/A;   CORONARY STENT INTERVENTION N/A 11/15/2021   Procedure: CORONARY STENT INTERVENTION;  Surgeon: Court Dorn PARAS, MD;  Location: [facility];  Service: Cardiovascular;  Laterality: N/A;    CORONARY STENT PLACEMENT  2000   x3    CYST EXCISION     x 2   ENDARTERECTOMY Right 12/25/2017   Procedure: ENDARTERECTOMY CAROTID RIGHT;  Surgeon: Oris Krystal FALCON, MD;  Location: [facility];  Service: Vascular;  Laterality: Right;   LEFT HEART CATH AND CORONARY ANGIOGRAPHY N/A 03/26/2019   Procedure: LEFT HEART CATH AND CORONARY ANGIOGRAPHY;  Surgeon: Verlin Lonni BIRCH, MD;  Location: [facility];  Service: Cardiovascular;  Laterality: N/A;   LEFT HEART CATH AND CORONARY ANGIOGRAPHY N/A 11/15/2021   Procedure: LEFT HEART CATH AND CORONARY ANGIOGRAPHY;  Surgeon: Court Dorn PARAS, MD;  Location: [facility];  Service: Cardiovascular;  Laterality: N/A;   PATCH ANGIOPLASTY Right 12/25/2017   Procedure: PATCH ANGIOPLASTY RIGHT CAROTID ARTERY;  Surgeon: Oris Krystal FALCON, MD;  Location: [facility];  Service: Vascular;  Laterality: Right;   TEAR DUCT PROBING Bilateral 07/2020   done at [facility]. Dr. Greig Gay   TEE WITHOUT CARDIOVERSION N/A 06/29/2018   Procedure: TRANSESOPHAGEAL ECHOCARDIOGRAM (TEE);  Surgeon: Okey Vina GAILS, MD;  Location: [facility];  Service: Cardiovascular;  Laterality: N/A;    Social History:  Social History   Socioeconomic History   Marital status: Married    Spouse name: Not on file   Number of children: 2   Years of education: Not on file   Highest education level: Not on file  Occupational History   Occupation: retired    Comment: welder  Tobacco Use   Smoking status: Former    Current packs/day: 0.00    Average packs/day: 2.0 packs/day for 20.0 years (40.0 ttl pk-yrs)    Types: Cigarettes    Start date: 05/23/1968    Quit date: 05/23/1988    Years since quitting: 35.9   Smokeless tobacco: Never   Tobacco comments:    smoked 2 packs per day for 20 years  Vaping Use   Vaping status: Never Used  Substance and Sexual Activity   Alcohol  use: No   Drug use: No   Sexual activity: Yes  Other Topics Concern   Not on file  Social History Narrative   Patient  lives with wife,    Patient is retired.    Social Drivers of Health   Tobacco Use: Medium Risk (05/08/2024)   Patient History    Smoking Tobacco Use: Former    Smokeless Tobacco Use: Never    Passive  Exposure: Not on file  Financial Resource Strain: Not on file  Food Insecurity: No Food Insecurity (03/29/2024)   Epic    Worried About Programme Researcher, Broadcasting/film/video in the Last Year: Never true    Ran Out of Food in the Last Year: Never true  Transportation Needs: No Transportation Needs (03/29/2024)   Epic    Lack of Transportation (Medical): No    Lack of Transportation (Non-Medical): No  Physical Activity: Not on file  Stress: Not on file  Social Connections: Unknown (03/29/2024)   Social Connection and Isolation Panel    Frequency of Communication with Friends and Family: More than three times a week    Frequency of Social Gatherings with Friends and Family: More than three times a week    Attends Religious Services: Patient unable to answer    Active Member of Clubs or Organizations: Patient unable to answer    Attends Banker Meetings: Patient unable to answer    Marital Status: Married  Catering Manager Violence: Not At Risk (03/29/2024)   Epic    Fear of Current or Ex-Partner: No    Emotionally Abused: No    Physically Abused: No    Sexually Abused: No  Depression (PHQ2-9): Medium Risk (05/31/2022)   Depression (PHQ2-9)    PHQ-2 Score: 7  Alcohol  Screen: Not on file  Housing: Low Risk (03/29/2024)   Epic    Unable to Pay for Housing in the Last Year: No    Number of Times Moved in the Last Year: 0    Homeless in the Last Year: No  Utilities: Not At Risk (03/29/2024)   Epic    Threatened with loss of utilities: No  Health Literacy: Not on file    Family History:  Family History  Problem Relation Age of Onset   Coronary artery disease Mother 6       deceased   Hypertension Mother    Other Father 48       deceased old age   Other Sister 71       living and  healthy   Coronary artery disease Brother    Skin cancer Brother    Colon cancer Neg Hx    Pancreatic cancer Neg Hx    Stomach cancer Neg Hx    Esophageal cancer Neg Hx    Liver disease Neg Hx     Medications:   Current Outpatient Medications on File Prior to Visit  Medication Sig Dispense Refill   acetaminophen  (TYLENOL ) 500 MG tablet Take 500 mg by mouth every 6 (six) hours as needed for mild pain.     Alcaftadine (LASTACAFT) 0.25 % SOLN Place 1 drop into both eyes as needed (dry eyes).     ALLERGY RELIEF 180 MG tablet Take 180 mg by mouth every morning.     aspirin  EC 81 MG EC tablet Take 1 tablet (81 mg total) by mouth daily. 90 tablet 3   carvedilol  (COREG ) 6.25 MG tablet Take 1 tablet (6.25 mg total) by mouth 2 (two) times daily with a meal. 180 tablet 3   clopidogrel  (PLAVIX ) 75 MG tablet Take 1 tablet by mouth once daily (Patient taking differently: Take 75 mg by mouth at bedtime.) 90 tablet 2   divalproex  (DEPAKOTE ) 125 MG DR tablet Take 1 tablet (125 mg total) by mouth at bedtime. 90 tablet 3   gabapentin  (NEURONTIN ) 300 MG capsule Take 1 capsule (300 mg total) by mouth 3 (three) times daily. (Patient taking differently:  Take 300 mg by mouth 2 (two) times daily.) 90 capsule 6   isosorbide  mononitrate (IMDUR ) 30 MG 24 hr tablet TAKE 1 TABLET BY MOUTH ONCE DAILY - MUST KEEP FOLLOW UP APPT IN JULY 2025 FOR MORE REFILLS (Patient taking differently: Take 30 mg by mouth at bedtime.) 90 tablet 3   ketoconazole (NIZORAL) 2 % shampoo Apply 1 Application topically once a week.     losartan  (COZAAR ) 100 MG tablet Take 1 tablet (100 mg total) by mouth daily. 90 tablet 3   methocarbamol (ROBAXIN) 500 MG tablet Take 500 mg by mouth 3 (three) times daily as needed for muscle spasms.     montelukast (SINGULAIR) 10 MG tablet Take 10 mg by mouth at bedtime as needed (allergies).     nitroGLYCERIN  (NITROSTAT ) 0.4 MG SL tablet Place 1 tablet (0.4 mg total) under the tongue every 5 (five) minutes as  needed for chest pain. 25 tablet 3   nystatin  cream (MYCOSTATIN ) Apply 1 Application topically daily as needed (rash).     NYSTATIN  powder Apply 1 Application topically daily as needed (rash).     oxyCODONE  (ROXICODONE ) 15 MG immediate release tablet Take 15 mg by mouth every 6 (six) hours as needed for pain.     pantoprazole  (PROTONIX ) 40 MG tablet Take 1 tablet (40 mg total) by mouth daily. 90 tablet 3   ranolazine  (RANEXA ) 500 MG 12 hr tablet Take 1 tablet (500 mg total) by mouth 2 (two) times daily. 180 tablet 3   rosuvastatin  (CRESTOR ) 40 MG tablet Take 40 mg by mouth at bedtime.     No current facility-administered medications on file prior to visit.    Allergies:   Allergies  Allergen Reactions   Codeine Anaphylaxis   Oxycontin  [Oxycodone  Hcl] Itching    Through IV- has since had oral oxycodone  without issue   Contrast Media [Iodinated Contrast Media] Nausea Only    Patient got very nauseated. Gave 4mg  Zofran . Possibly pre medicate with Zofran     Morphine  And Codeine Itching      OBJECTIVE:  Physical Exam  Vitals:   05/08/24 1406  BP: (!) 152/75  Pulse: 76  Weight: 208 lb 3.2 oz (94.4 kg)  Height: 5' 7 (1.702 m)    Body mass index is 32.61 kg/m. No results found.  General: well developed, well nourished, pleasant elderly Caucasian male, seated, in no evident distress  Neurologic Exam Mental Status: Awake and fully alert.  Some speech hesitancy, unable to appreciate aphasia or dysarthria.  Oriented to place and time. Recent memory impaired and remote memory intact. Attention span, concentration and fund of knowledge appropriate during visit. Mood and affect appropriate during visit.  Cranial Nerves: Pupils equal, briskly reactive to light. Extraocular movements full without nystagmus. Visual fields partial left upper homonymous quadrantanopia.  Hearing intact. Facial sensation intact. tongue, palate moves normally and symmetrically. Left nasolabial fold  flattening. Motor: Normal bulk and tone. Normal strength in all tested extremity muscles except slightly decreased left hand grip.  No significant tremor on exam today, possibly very slight tremor of outstretched arms R>L, no tremor in head/neck, no cogwheel rigidity or bradykinesia.  Does not have resting tremor. Sensory.: intact to touch , pinprick , position and vibratory sensation.  Coordination: Rapid alternating movements normal in all extremities except slightly decreased left hand. Finger-to-nose and heel-to-shin performed accurately bilaterally. Gait and Station: Arises from chair without difficulty. Stance is normal. Gait demonstrates wide-based gait with very mild imbalance without use of AD.  Able  to tandem walk and heel toe with mild to moderate difficulty.         ASSESSMENT: Jeremy [name] is a 78 y.o. year old male with right PCA, cerebellar and punctate frontoparietal infarcts on 11/15/2021 likely related to cardiac cath procedure. Vascular risk factors include history of right CEA, history of seizure and stroke 2020, HTN, HLD, B12 deficiency, CAD, CHF and advanced age.  Prior concern of LUE tremor and excessive daytime fatigue which improved some after lowering Depakote  dosage.  Complained of occipital headache 10/2021 and left-sided deficits not noted previously, repeat MRI brain negative for new or acute findings, no recurrent headache.  Episode of altered mental status in 03/2024 of unknown cause after ED evaluation    PLAN:  Episode of altered mental status Hx of seizure Unclear cause of recent event, possibly TIA vs partial seizure although length of event not typical for epileptic seizure Episode consisted of AMS, feeling very fatigued, playing with food, and then felt like he couldn't move his arms or legs. Whole event lasted approx 20 minutes. No other seizure like activity and no postictal symptoms. Occurred after full day of working in yard.  Patient declines interest  in adding additional ASM. Prior intolerance to levetiracetam , lacosamide  and phenytoin .  Difficulty tolerating higher dose of Depakote .  Advised if recurrent event should occur, it be recommended to proceed with EEG and likely additional ASM agent He will continue on Depakote  125 mg nightly for now; also on gabapentin  300 mg twice daily per PCP Discussed importance of ensuring he keeps well-hydrated especially while working outdoors. He does admit to limited water  intake. Fully discussed this today  Right PCA strokes:  Hx of prior strokes Residual deficit: Occasional word mixup, peripheral vision impairment and mild cognitive impairment- stable, continue to follow with ophthalmology. Suspect LUE and left facial weakness in setting of prior strokes  Continue aspirin  81 mg daily and clopidogrel  75 mg daily  and Crestor  per cardiology recommendations and for secondary stroke prevention managed/prescribed by PCP/cardiology Discussed secondary stroke prevention measures and importance of close PCP follow up for aggressive stroke risk factor management including BP goal<130/90, and HLD with LDL goal<70 I have gone over the pathophysiology of stroke, warning signs and symptoms, risk factors and their management in some detail with instructions to go to the closest emergency room for symptoms of concern.      Follow up in 9 months or call earlier if needed    CC:  PCP: [name], MD    Harlene Bogaert, AGNP-BC  [facility] [address] [place], [place] [address]  Phone [phone] Fax [phone] Note: This document was prepared with digital dictation and possible smart phrase technology. Any transcriptional errors that result from this process are unintentional.

## 2024-05-13 NOTE — Progress Notes (Signed)
 I agree with the above plan

## 2024-05-29 ENCOUNTER — Ambulatory Visit: Payer: Self-pay | Admitting: Cardiovascular Disease

## 2024-05-29 DIAGNOSIS — G459 Transient cerebral ischemic attack, unspecified: Secondary | ICD-10-CM | POA: Diagnosis not present

## 2024-06-10 NOTE — Progress Notes (Signed)
 "   Cardiology Office Note    Date:  06/20/2024   ID:  Jeremy [name], DOB Sep 08, 1945, MRN [id number]  PCP:  [name], MD  Cardiologist: Maude Emmer, MD     History of Present Illness:    79 y.o. previously seen by Dr Kerin. History of CAD with stenting of the CircumlfexLAD. CRF;s HTN, HLD. PVD with history CVA and right CEA   NSTEMI 03/25/19 peak troponin 5345 ICLBBB on ECG  Cath by Dr. Verlin on 03/26/2019 and showed 50% prox-RCA stenosis, 60% dRCA, 50% RPDA, 90% prox LCx, 99% Prox-LAD, 70% Distal LCx, 60% 2nd Mrg, 80% 2nd Diagonal and preserved EF of 50-55%. Was treated with PTCA/DESx1 to the LAD and successful scoring balloon angioplasty in the previously stented segment of LCx.   Myovue done 10/31/22 was normal no ischemia EF 63%  Life long DAT with stents , restenosis and CVA   Admitted 09/18/20 with chest pain diaphoresis r/o TTE normal EF no RWMAls And no acute ECG changes Cr elevated 1.9 from baseline 1.6 ACE d/c and imdur  started   Admitted 11/14/21 with angina old LBBB and negative troponins .  CAth with patent stents to proximal LAD and LCX Had 80% mid RCA lesion with new stent. Has residual small vessel disease in OM and diagonals EF preserved by TTE 60-65% 11/14/21 Carotids with plaque no stenosis Had ? Embolic stroke post cath with aphasia, confusion and combativeness Was reloaded with Depakote  MRI with right PCA stroke   Long discussion about need for weight loss He finished cardiac rehab 05/27/22   Seems to have some depression or cognitive slowing from stroke Wife complains that he has fatigue and can sleep for days TSH is normal discussed updating echo for persistent dyspnea and fatigue TTE 10/25/21 wth normal EF 60-65% Myovue 10/31/19 normal perfusion no ischemia EF 63% Still gets some angina with yard work  Some headaches will seeing [place] neuro Fatigue improved no angina Coreg  decreased and losartan  increased for some dizziness and bradycardia  Seen in ED 11/25/23  found unresponsive in car with low BP BP 86/60 mmHg by EMS Felt better with iv fluids BP on d/c from EF 130/51 mmHg Telemetry with no arrhythmia. ECG with chronic LBBB and first degree No chest pain Troponin negative Hct 34.9 BUN 21 Cr 1.79   No recurrence since meds adjusted   Has two dogs at home but does not walk them Discussed being more active   F/U echo 03/29/24 normal EF and monitor 05/29/24 no significant arrythmia, AV block average HR 66 bpm   Seen by neuro 05/08/24 for AMS and staring episodes MRI no acute abnormalities Depakote  dose reduced His foggy feeling can last all day Its like a switch that turns on /off       Past Medical History:  Diagnosis Date   Anxiety    Arthritis    Basal cell carcinoma 02/11/2021   nod- left dorsal hand (EXC)   CAD (coronary artery disease)    a. s/p prior stenting of LAD and RCA b. 03/2019: NSTEMI and required PTCA/DESx1 to the LAD and successful scoring balloon angioplasty in the previously stented segment of LCx   Carotid stenosis, asymptomatic, right    Carpal tunnel syndrome    bilateral   Coronary artery disease    GERD (gastroesophageal reflux disease)    History of kidney stones    Hx of colonic polyp    Hypercholesterolemia    Hypertension    Myocardial infarction (HCC)  hx of   Obesity    SCC (squamous cell carcinoma) 02/11/2021   in situ- right temple (CX35FU)   Squamous cell carcinoma of skin 08/09/2016   well differentiated on right outer eye - tx p bx   Stroke [facility])     Past Surgical History:  Procedure Laterality Date   CARDIAC CATHETERIZATION     CATARACT EXTRACTION W/PHACO Left 03/12/2021   Procedure: CATARACT EXTRACTION PHACO AND INTRAOCULAR LENS PLACEMENT (IOC);  Surgeon: Harrie Agent, MD;  Location: [facility];  Service: Ophthalmology;  Laterality: Left;   CDE: 14.58   CATARACT EXTRACTION W/PHACO Right 04/08/2021   Procedure: CATARACT EXTRACTION PHACO AND INTRAOCULAR LENS PLACEMENT RIGHT EYE;  Surgeon:  Harrie Agent, MD;  Location: [facility];  Service: Ophthalmology;  Laterality: Right;  CDE 10.94   CORONARY BALLOON ANGIOPLASTY N/A 03/26/2019   Procedure: CORONARY BALLOON ANGIOPLASTY;  Surgeon: Verlin Lonni BIRCH, MD;  Location: [facility];  Service: Cardiovascular;  Laterality: N/A;   CORONARY STENT INTERVENTION N/A 03/26/2019   Procedure: CORONARY STENT INTERVENTION;  Surgeon: Verlin Lonni BIRCH, MD;  Location: [facility];  Service: Cardiovascular;  Laterality: N/A;   CORONARY STENT INTERVENTION N/A 11/15/2021   Procedure: CORONARY STENT INTERVENTION;  Surgeon: Court Dorn PARAS, MD;  Location: [facility];  Service: Cardiovascular;  Laterality: N/A;   CORONARY STENT PLACEMENT  2000   x3    CYST EXCISION     x 2   ENDARTERECTOMY Right 12/25/2017   Procedure: ENDARTERECTOMY CAROTID RIGHT;  Surgeon: Oris Krystal FALCON, MD;  Location: [facility];  Service: Vascular;  Laterality: Right;   LEFT HEART CATH AND CORONARY ANGIOGRAPHY N/A 03/26/2019   Procedure: LEFT HEART CATH AND CORONARY ANGIOGRAPHY;  Surgeon: Verlin Lonni BIRCH, MD;  Location: [facility];  Service: Cardiovascular;  Laterality: N/A;   LEFT HEART CATH AND CORONARY ANGIOGRAPHY N/A 11/15/2021   Procedure: LEFT HEART CATH AND CORONARY ANGIOGRAPHY;  Surgeon: Court Dorn PARAS, MD;  Location: [facility];  Service: Cardiovascular;  Laterality: N/A;   PATCH ANGIOPLASTY Right 12/25/2017   Procedure: PATCH ANGIOPLASTY RIGHT CAROTID ARTERY;  Surgeon: Oris Krystal FALCON, MD;  Location: [facility];  Service: Vascular;  Laterality: Right;   TEAR DUCT PROBING Bilateral 07/2020   done at [facility]. Dr. Greig Gay   TEE WITHOUT CARDIOVERSION N/A 06/29/2018   Procedure: TRANSESOPHAGEAL ECHOCARDIOGRAM (TEE);  Surgeon: Okey Vina GAILS, MD;  Location[facility] ENDOSCOPY;  Service: Cardiovascular;  Laterality: N/A;    Current Medications: Outpatient Medications Prior to Visit  Medication Sig Dispense Refill   acetaminophen   (TYLENOL ) 500 MG tablet Take 500 mg by mouth every 6 (six) hours as needed for mild pain.     Alcaftadine (LASTACAFT) 0.25 % SOLN Place 1 drop into both eyes as needed (dry eyes).     ALLERGY RELIEF 180 MG tablet Take 180 mg by mouth every morning.     aspirin  EC 81 MG EC tablet Take 1 tablet (81 mg total) by mouth daily. 90 tablet 3   carvedilol  (COREG ) 6.25 MG tablet Take 1 tablet (6.25 mg total) by mouth 2 (two) times daily with a meal. 180 tablet 3   clopidogrel  (PLAVIX ) 75 MG tablet Take 1 tablet by mouth once daily (Patient taking differently: Take 75 mg by mouth at bedtime.) 90 tablet 2   divalproex  (DEPAKOTE ) 125 MG DR tablet Take 1 tablet (125 mg total) by mouth at bedtime. 90 tablet 3   gabapentin  (NEURONTIN ) 300 MG capsule Take  1 capsule (300 mg total) by mouth 3 (three) times daily. (Patient taking differently: Take 300 mg by mouth 2 (two) times daily.) 90 capsule 6   isosorbide  mononitrate (IMDUR ) 30 MG 24 hr tablet TAKE 1 TABLET BY MOUTH ONCE DAILY - MUST KEEP FOLLOW UP APPT IN JULY 2025 FOR MORE REFILLS (Patient taking differently: Take 30 mg by mouth at bedtime.) 90 tablet 3   ketoconazole (NIZORAL) 2 % shampoo Apply 1 Application topically once a week.     losartan  (COZAAR ) 100 MG tablet Take 1 tablet (100 mg total) by mouth daily. 90 tablet 3   methocarbamol (ROBAXIN) 500 MG tablet Take 500 mg by mouth 3 (three) times daily as needed for muscle spasms.     montelukast (SINGULAIR) 10 MG tablet Take 10 mg by mouth at bedtime as needed (allergies).     nitroGLYCERIN  (NITROSTAT ) 0.4 MG SL tablet Place 1 tablet (0.4 mg total) under the tongue every 5 (five) minutes as needed for chest pain. 25 tablet 3   nystatin  cream (MYCOSTATIN ) Apply 1 Application topically daily as needed (rash).     NYSTATIN  powder Apply 1 Application topically daily as needed (rash).     oxyCODONE  (ROXICODONE ) 15 MG immediate release tablet Take 15 mg by mouth every 6 (six) hours as needed for pain.      pantoprazole  (PROTONIX ) 40 MG tablet Take 1 tablet (40 mg total) by mouth daily. 90 tablet 3   ranolazine  (RANEXA ) 500 MG 12 hr tablet Take 1 tablet (500 mg total) by mouth 2 (two) times daily. 180 tablet 3   rosuvastatin  (CRESTOR ) 40 MG tablet Take 40 mg by mouth at bedtime.     No facility-administered medications prior to visit.     Allergies:   Codeine, Oxycontin  [oxycodone  hcl], Contrast media [iodinated contrast media], and Morphine  and codeine   Social History   Socioeconomic History   Marital status: Married    Spouse name: Not on file   Number of children: 2   Years of education: Not on file   Highest education level: Not on file  Occupational History   Occupation: retired    Comment: welder  Tobacco Use   Smoking status: Former    Current packs/day: 0.00    Average packs/day: 2.0 packs/day for 20.0 years (40.0 ttl pk-yrs)    Types: Cigarettes    Start date: 05/23/1968    Quit date: 05/23/1988    Years since quitting: 36.1   Smokeless tobacco: Never   Tobacco comments:    smoked 2 packs per day for 20 years  Vaping Use   Vaping status: Never Used  Substance and Sexual Activity   Alcohol  use: No   Drug use: No   Sexual activity: Yes  Other Topics Concern   Not on file  Social History Narrative   Patient lives with wife,    Patient is retired.    Social Drivers of Health   Tobacco Use: Medium Risk (05/08/2024)   Patient History    Smoking Tobacco Use: Former    Smokeless Tobacco Use: Never    Passive Exposure: Not on Actuary Strain: Not on file  Food Insecurity: No Food Insecurity (03/29/2024)   Epic    Worried About Programme Researcher, Broadcasting/film/video in the Last Year: Never true    Ran Out of Food in the Last Year: Never true  Transportation Needs: No Transportation Needs (03/29/2024)   Epic    Lack of Transportation (Medical): No  Lack of Transportation (Non-Medical): No  Physical Activity: Not on file  Stress: Not on file  Social Connections:  Unknown (03/29/2024)   Social Connection and Isolation Panel    Frequency of Communication with Friends and Family: More than three times a week    Frequency of Social Gatherings with Friends and Family: More than three times a week    Attends Religious Services: Patient unable to answer    Active Member of Clubs or Organizations: Patient unable to answer    Attends Banker Meetings: Patient unable to answer    Marital Status: Married  Depression (PHQ2-9): Medium Risk (05/31/2022)   Depression (PHQ2-9)    PHQ-2 Score: 7  Alcohol  Screen: Not on file  Housing: Low Risk (03/29/2024)   Epic    Unable to Pay for Housing in the Last Year: No    Number of Times Moved in the Last Year: 0    Homeless in the Last Year: No  Utilities: Not At Risk (03/29/2024)   Epic    Threatened with loss of utilities: No  Health Literacy: Not on file     Family History:  The patient's family history includes Coronary artery disease in his brother; Coronary artery disease (age of onset: 26) in his mother; Hypertension in his mother; Other (age of onset: 59) in his father; Other (age of onset: 52) in his sister; Skin cancer in his brother.   Review of Systems:   Please see the history of present illness.     General:  No chills, fever, night sweats or weight changes. Positive for bilateral shoulder pain (occurring for several months). Cardiovascular:  No chest pain, dyspnea on exertion, edema, orthopnea, palpitations, paroxysmal nocturnal dyspnea. Dermatological: No rash, lesions/masses Respiratory: No cough, dyspnea Urologic: No hematuria, dysuria Abdominal:   No nausea, vomiting, diarrhea, bright red blood per rectum, melena, or hematemesis Neurologic:  No visual changes, wkns, changes in mental status. All other systems reviewed and are otherwise negative except as noted above.   Physical Exam:    VS:  BP 138/62 (BP Location: Left Arm, Patient Position: Sitting, Cuff Size: Normal)   Pulse 72    Ht 5' 7 (1.702 m)   Wt 203 lb (92.1 kg)   BMI 31.79 kg/m     Affect appropriate Healthy:  appears stated age HEENT: normal Neck supple with no adenopathy JVP normal right CEA s no thyromegaly Lungs clear with no wheezing and good diaphragmatic motion Heart:  S1/S2 no murmur, no rub, gallop or click PMI normal Abdomen: benighn, BS positve, no tenderness, no AAA no bruit.  No HSM or HJR Distal pulses intact with no bruits No edema Neuro non-focal Skin warm and dry No muscular weakness Restricted ROM both shoulders    Wt Readings from Last 3 Encounters:  06/20/24 203 lb (92.1 kg)  05/08/24 208 lb 3.2 oz (94.4 kg)  04/01/24 212 lb (96.2 kg)     Studies/Labs Reviewed:   EKG:   NSR, HR 65 with anterior infarct pattern and diffuse TWI along the lateral leads which has improved when compared to his prior tracing.   Recent Labs: 03/28/2024: ALT 12 03/29/2024: BUN 23; Creatinine, Ser 1.76; Hemoglobin 11.4; Magnesium  2.2; Platelets 192; Potassium 3.7; Sodium 140   Lipid Panel    Component Value Date/Time   CHOL 90 03/29/2024 0408   CHOL 125 02/11/2019 0847   TRIG 189 (H) 03/29/2024 0408   HDL 33 (L) 03/29/2024 0408   HDL 45 02/11/2019 0847  CHOLHDL 2.7 03/29/2024 0408   VLDL 38 03/29/2024 0408   LDLCALC 19 03/29/2024 0408   LDLCALC 64 02/11/2019 0847    Additional studies/ records that were reviewed today include:   Cardiac Catheterization: 11/15/21  Diagnostic Dominance: Right Left Anterior Descending  Vessel is large.  Non-stenotic Prox LAD lesion was previously treated.    Second Diagonal Branch  Vessel is small in size.  2nd Diag lesion is 80% stenosed.    Left Circumflex  Vessel is large.  Prox Cx to Mid Cx lesion is 20% stenosed. The lesion was previously treated using a stent (unknown type) over 2 years ago.  Dist Cx lesion is 70% stenosed.    Second Obtuse Marginal Branch  Vessel is moderate in size.  2nd Mrg lesion is 60% stenosed.    Third  Obtuse Marginal Branch  Vessel is moderate in size.  3rd Mrg lesion is 90% stenosed.    Right Coronary Artery  Vessel is large.  Prox RCA to Mid RCA lesion is 80% stenosed. The lesion was previously treated .  Dist RCA lesion is 90% stenosed.    Right Posterior Descending Artery  RPDA lesion is 50% stenosed.    Intervention   Prox RCA to Mid RCA lesion  Stent  Lesion crossed with guidewire. Pre-stent angioplasty was performed. A drug-eluting stent was successfully placed using a SYNERGY XD [id number]. Stent strut is well apposed. Stent overlaps previously placed stent. Post-stent angioplasty was performed.  Post-Intervention Lesion Assessment  The intervention was successful. Pre-interventional TIMI flow is 3. Post-intervention TIMI flow is 3. No-reflow occurred during the intervention.  There is a 0% residual stenosis post intervention.    Dist RCA lesion  Stent  Lesion crossed with guidewire. Pre-stent angioplasty was performed. A stent was successfully placed. Stent strut is well apposed. Stent does not overlap previously placed stentPost-stent angioplasty was not performed.  Post-Intervention Lesion Assessment  The intervention was successful. Pre-interventional TIMI flow is 3. Post-intervention TIMI flow is 3. No complications occurred at this lesion.  There is a 0% residual stenosis post intervention.      Echo 03/29/24   1. Left ventricular ejection fraction, by estimation, is 60 to 65%. The  left ventricle has normal function. The left ventricle has no regional  wall motion abnormalities. There is moderate left ventricular hypertrophy.  Left ventricular diastolic  parameters are consistent with Grade I diastolic dysfunction (impaired  relaxation).   2. Right ventricular systolic function is normal. The right ventricular  size is normal.   3. The mitral valve is normal in structure. Trivial mitral valve  regurgitation. No evidence of mitral stenosis.   4. The aortic valve  has an indeterminant number of cusps. Aortic valve  regurgitation is not visualized. No aortic stenosis is present.   5. The inferior vena cava is normal in size with greater than 50%  respiratory variability, suggesting right atrial pressure of 3 mmHg   Monitor 05/29/24  Study Highlights  NSR average HR 66 bpm  < 1% PVC burden < 1% PAC burden  No significant prolonged arrhythmia   Maude Emmer MD [facility]    Myovue 10/31/22   Study Result  Narrative & Impression      The study is normal. There are no perfusion defects consistent with prior infarct or current ischemia.  The study is low risk.   No ST deviation was noted.   LV perfusion is normal.   Left ventricular function is normal. Nuclear stress EF: 63 %. The  left ventricular ejection fraction is normal (55-65%). End diastolic cavity size is normal.   There is a large moderate intensity inferoseptal defect in the resting images not present in the stress images with normal wall motion and adjactent gut radiotracer uptake. Findings consistent with artifact.     Plan:   In order of problems listed above:  1. CAD:  Distant history of RCA stent and 03/2019 intervention to LAD and circumflex  Cath 11/14/21 restenosis of stents to mid/distal RCA with repeat intervention Residual small diagonal and OM dx stents to proximal LAD and mid LCX patent Stable angina  Ranexa  500 mg bid in addition to imdur  and beta blocker Lexiscan  myovue non ischemic with normal EF 10/31/22   2. HTN BP has been low with bradycardia and known conduction dx on ECG first degree LBBB.  Coreg  decreased Was taking losartan  100 mg daily to compensate for lower coreg  dose     3. HLD  LDL 59 11/14/21  continue crestor     4. History of CVA and another post cath cerebellar June 2023 carotids ok on Depakote  dose reduced. MRI no acute abnormalities  Intolerant to levetiracetam , lacosamide  and dilantin . EEG for any recurrence F/U with NP Harlene Bogaert  5. CRF:  Baseline Cr  1.76  6. Fatigue:  non cardiac F/U primary consider SSRI. ? Also does he still need to be on Depakote  and Neurontin  post stroke Will f/u with Dr Shona TSH normal EF normal by 10/31/22 myovue 63%  TTE 03/29/24 normal LV/RV function   7. Syncope: ? Vagal in heat and med related Beta blocker decreased. Has LBBB if recurs discussed possible loop recorder Monitor done 05/29/24 average HR 66 bpm < 1% PAC/PVC burden no significant AV block  8.  Neuro:  AMS and periods of foggyness On neurontin  and depakote  ? Need for f/u EEG He has had CVA and not clear how his change in MS is related to this     F/U in a year   Signed, Maude Emmer, MD  06/20/2024 8:53 AM    [facility] [address] [place], [name] Phone: [phone] Fax: [phone] "

## 2024-06-20 ENCOUNTER — Ambulatory Visit: Admitting: Cardiovascular Disease

## 2024-06-20 ENCOUNTER — Encounter: Payer: Self-pay | Admitting: Cardiovascular Disease

## 2024-06-20 VITALS — BP 138/62 | HR 72 | Ht 67.0 in | Wt 203.0 lb

## 2024-06-20 DIAGNOSIS — I10 Essential (primary) hypertension: Secondary | ICD-10-CM | POA: Diagnosis not present

## 2024-06-20 DIAGNOSIS — E785 Hyperlipidemia, unspecified: Secondary | ICD-10-CM

## 2024-06-20 DIAGNOSIS — I63431 Cerebral infarction due to embolism of right posterior cerebral artery: Secondary | ICD-10-CM

## 2024-06-20 DIAGNOSIS — Z9889 Other specified postprocedural states: Secondary | ICD-10-CM | POA: Diagnosis not present

## 2024-06-20 DIAGNOSIS — I2510 Atherosclerotic heart disease of native coronary artery without angina pectoris: Secondary | ICD-10-CM | POA: Diagnosis not present

## 2024-06-20 DIAGNOSIS — G459 Transient cerebral ischemic attack, unspecified: Secondary | ICD-10-CM | POA: Diagnosis not present

## 2024-06-20 NOTE — Patient Instructions (Addendum)
 Medication Instructions:  Your physician recommends that you continue on your current medications as directed. Please refer to the Current Medication list given to you today.  *If you need a refill on your cardiac medications before your next appointment, please call your pharmacy*  Lab Work: NONE  If you have labs (blood work) drawn today and your tests are completely normal, you will receive your results only by: MyChart Message (if you have MyChart) OR A paper copy in the mail If you have any lab test that is abnormal or we need to change your treatment, we will call you to review the results.  Testing/Procedures: NONE   Follow-Up: At [facility], you and your health needs are our priority.  As part of our continuing mission to provide you with exceptional heart care, our providers are all part of one team.  This team includes your primary Cardiologist (physician) and Advanced Practice Providers or APPs (Physician Assistants and Nurse Practitioners) who all work together to provide you with the care you need, when you need it.  Your next appointment:   6 month(s)  Provider:   Maude Emmer, MD     Other Instructions

## 2025-01-08 ENCOUNTER — Ambulatory Visit: Admitting: Adult Health
